# Patient Record
Sex: Male | Born: 1945
Health system: Southern US, Community
[De-identification: ages and names within clinical notes are randomized; demographics above are authoritative.]

## PROBLEM LIST (undated history)

## (undated) DIAGNOSIS — K279 Peptic ulcer, site unspecified, unspecified as acute or chronic, without hemorrhage or perforation: Secondary | ICD-10-CM

## (undated) DIAGNOSIS — E785 Hyperlipidemia, unspecified: Secondary | ICD-10-CM

## (undated) DIAGNOSIS — C3432 Malignant neoplasm of lower lobe, left bronchus or lung: Secondary | ICD-10-CM

## (undated) DIAGNOSIS — R739 Hyperglycemia, unspecified: Secondary | ICD-10-CM

## (undated) DIAGNOSIS — C4492 Squamous cell carcinoma of skin, unspecified: Secondary | ICD-10-CM

## (undated) DIAGNOSIS — H269 Unspecified cataract: Secondary | ICD-10-CM

## (undated) DIAGNOSIS — J069 Acute upper respiratory infection, unspecified: Secondary | ICD-10-CM

## (undated) DIAGNOSIS — K5730 Diverticulosis of large intestine without perforation or abscess without bleeding: Secondary | ICD-10-CM

## (undated) DIAGNOSIS — K5790 Diverticulosis of intestine, part unspecified, without perforation or abscess without bleeding: Secondary | ICD-10-CM

## (undated) DIAGNOSIS — Z0000 Encounter for general adult medical examination without abnormal findings: Secondary | ICD-10-CM

## (undated) DIAGNOSIS — I10 Essential (primary) hypertension: Secondary | ICD-10-CM

## (undated) DIAGNOSIS — M1990 Unspecified osteoarthritis, unspecified site: Secondary | ICD-10-CM

## (undated) DIAGNOSIS — N486 Induration penis plastica: Secondary | ICD-10-CM

## (undated) DIAGNOSIS — C8590 Non-Hodgkin lymphoma, unspecified, unspecified site: Secondary | ICD-10-CM

## (undated) DIAGNOSIS — R413 Other amnesia: Secondary | ICD-10-CM

## (undated) DIAGNOSIS — C3430 Malignant neoplasm of lower lobe, unspecified bronchus or lung: Secondary | ICD-10-CM

## (undated) DIAGNOSIS — L0390 Cellulitis, unspecified: Secondary | ICD-10-CM

## (undated) DIAGNOSIS — C801 Malignant (primary) neoplasm, unspecified: Secondary | ICD-10-CM

## (undated) DIAGNOSIS — T7840XA Allergy, unspecified, initial encounter: Secondary | ICD-10-CM

## (undated) DIAGNOSIS — G709 Myoneural disorder, unspecified: Secondary | ICD-10-CM

## (undated) DIAGNOSIS — N189 Chronic kidney disease, unspecified: Secondary | ICD-10-CM

## (undated) DIAGNOSIS — K219 Gastro-esophageal reflux disease without esophagitis: Secondary | ICD-10-CM

## (undated) HISTORY — DX: Essential (primary) hypertension: I10

## (undated) HISTORY — DX: Cellulitis, unspecified: L03.90

## (undated) HISTORY — PX: SKIN BIOPSY: SHX1

## (undated) HISTORY — DX: Gilbert syndrome: E80.4

## (undated) HISTORY — DX: Encounter for general adult medical examination without abnormal findings: Z00.00

## (undated) HISTORY — DX: Hyperlipidemia, unspecified: E78.5

## (undated) HISTORY — DX: Unspecified cataract: H26.9

## (undated) HISTORY — PX: OTHER SURGICAL HISTORY: SHX169

## (undated) HISTORY — DX: Non-Hodgkin lymphoma, unspecified, unspecified site: C85.90

## (undated) HISTORY — DX: Chronic kidney disease, unspecified: N18.9

## (undated) HISTORY — DX: Malignant neoplasm of lower lobe, left bronchus or lung: C34.32

## (undated) HISTORY — PX: VASECTOMY: SHX75

## (undated) HISTORY — DX: Induration penis plastica: N48.6

## (undated) HISTORY — DX: Diverticulosis of large intestine without perforation or abscess without bleeding: K57.30

## (undated) HISTORY — DX: Malignant (primary) neoplasm, unspecified: C80.1

## (undated) HISTORY — DX: Hyperglycemia, unspecified: R73.9

## (undated) HISTORY — DX: Peptic ulcer, site unspecified, unspecified as acute or chronic, without hemorrhage or perforation: K27.9

## (undated) HISTORY — DX: Squamous cell carcinoma of skin, unspecified: C44.92

## (undated) HISTORY — DX: Other amnesia: R41.3

## (undated) HISTORY — PX: VEIN SURGERY: SHX48

## (undated) HISTORY — DX: Malignant neoplasm of lower lobe, unspecified bronchus or lung: C34.30

## (undated) HISTORY — PX: REFRACTIVE SURGERY: SHX103

## (undated) HISTORY — DX: Allergy, unspecified, initial encounter: T78.40XA

---

## 1993-07-30 DIAGNOSIS — K279 Peptic ulcer, site unspecified, unspecified as acute or chronic, without hemorrhage or perforation: Secondary | ICD-10-CM

## 1993-07-30 HISTORY — DX: Peptic ulcer, site unspecified, unspecified as acute or chronic, without hemorrhage or perforation: K27.9

## 2003-07-08 ENCOUNTER — Encounter: Payer: Self-pay | Admitting: Internal Medicine

## 2003-07-31 HISTORY — PX: COLONOSCOPY W/ POLYPECTOMY: SHX1380

## 2004-09-19 ENCOUNTER — Ambulatory Visit: Payer: Self-pay | Admitting: Internal Medicine

## 2005-08-29 ENCOUNTER — Ambulatory Visit: Payer: Self-pay | Admitting: Internal Medicine

## 2006-10-08 ENCOUNTER — Ambulatory Visit: Payer: Self-pay | Admitting: Internal Medicine

## 2006-10-08 LAB — CONVERTED CEMR LAB
AST: 25 units/L (ref 0–37)
Albumin: 3.9 g/dL (ref 3.5–5.2)
Alkaline Phosphatase: 50 units/L (ref 39–117)
BUN: 16 mg/dL (ref 6–23)
Basophils Absolute: 0 10*3/uL (ref 0.0–0.1)
Basophils Relative: 0.2 % (ref 0.0–1.0)
CO2: 32 meq/L (ref 19–32)
Chloride: 101 meq/L (ref 96–112)
Cholesterol: 195 mg/dL (ref 0–200)
Creatinine, Ser: 1.1 mg/dL (ref 0.4–1.5)
HCT: 43.2 % (ref 39.0–52.0)
Hemoglobin: 14.8 g/dL (ref 13.0–17.0)
LDL Cholesterol: 117 mg/dL — ABNORMAL HIGH (ref 0–99)
MCHC: 34.3 g/dL (ref 30.0–36.0)
Monocytes Relative: 7.3 % (ref 3.0–11.0)
Neutrophils Relative %: 58.2 % (ref 43.0–77.0)
RBC: 4.71 M/uL (ref 4.22–5.81)
RDW: 12.3 % (ref 11.5–14.6)
Total Bilirubin: 1.3 mg/dL — ABNORMAL HIGH (ref 0.3–1.2)
Total CHOL/HDL Ratio: 3.1
Total Protein: 7.1 g/dL (ref 6.0–8.3)
VLDL: 14 mg/dL (ref 0–40)

## 2006-10-15 ENCOUNTER — Ambulatory Visit: Payer: Self-pay | Admitting: Internal Medicine

## 2007-07-31 DIAGNOSIS — R413 Other amnesia: Secondary | ICD-10-CM

## 2007-07-31 HISTORY — DX: Other amnesia: R41.3

## 2007-07-31 HISTORY — PX: CYSTOSCOPY: SUR368

## 2007-11-03 ENCOUNTER — Telehealth (INDEPENDENT_AMBULATORY_CARE_PROVIDER_SITE_OTHER): Payer: Self-pay | Admitting: *Deleted

## 2007-12-02 ENCOUNTER — Telehealth (INDEPENDENT_AMBULATORY_CARE_PROVIDER_SITE_OTHER): Payer: Self-pay | Admitting: *Deleted

## 2008-01-28 ENCOUNTER — Ambulatory Visit: Payer: Self-pay | Admitting: Internal Medicine

## 2008-01-28 DIAGNOSIS — M2550 Pain in unspecified joint: Secondary | ICD-10-CM | POA: Insufficient documentation

## 2008-01-28 DIAGNOSIS — N486 Induration penis plastica: Secondary | ICD-10-CM

## 2008-01-28 DIAGNOSIS — I10 Essential (primary) hypertension: Secondary | ICD-10-CM | POA: Insufficient documentation

## 2008-01-28 DIAGNOSIS — E782 Mixed hyperlipidemia: Secondary | ICD-10-CM | POA: Insufficient documentation

## 2008-02-02 ENCOUNTER — Telehealth (INDEPENDENT_AMBULATORY_CARE_PROVIDER_SITE_OTHER): Payer: Self-pay | Admitting: *Deleted

## 2008-02-02 ENCOUNTER — Encounter (INDEPENDENT_AMBULATORY_CARE_PROVIDER_SITE_OTHER): Payer: Self-pay | Admitting: *Deleted

## 2008-03-18 ENCOUNTER — Ambulatory Visit: Payer: Self-pay | Admitting: Internal Medicine

## 2008-03-19 ENCOUNTER — Encounter: Payer: Self-pay | Admitting: Internal Medicine

## 2008-03-22 LAB — CONVERTED CEMR LAB
Hemoglobin, Urine: NEGATIVE
Leukocytes, UA: NEGATIVE
Nitrite: NEGATIVE
Protein, ur: NEGATIVE mg/dL
pH: 6.5 (ref 5.0–8.0)

## 2008-06-07 ENCOUNTER — Ambulatory Visit: Payer: Self-pay | Admitting: Internal Medicine

## 2008-06-07 DIAGNOSIS — R310 Gross hematuria: Secondary | ICD-10-CM

## 2008-06-07 DIAGNOSIS — R413 Other amnesia: Secondary | ICD-10-CM | POA: Insufficient documentation

## 2008-06-07 DIAGNOSIS — R7309 Other abnormal glucose: Secondary | ICD-10-CM

## 2008-06-09 ENCOUNTER — Encounter (INDEPENDENT_AMBULATORY_CARE_PROVIDER_SITE_OTHER): Payer: Self-pay | Admitting: *Deleted

## 2008-06-10 ENCOUNTER — Encounter: Payer: Self-pay | Admitting: Internal Medicine

## 2008-06-17 ENCOUNTER — Encounter: Payer: Self-pay | Admitting: Internal Medicine

## 2008-06-19 LAB — CONVERTED CEMR LAB
Metaneph Total, Ur: 328 ug/24hr (ref 224–832)
Normetanephrine, 24H Ur: 265 (ref 122–676)

## 2008-06-21 ENCOUNTER — Ambulatory Visit: Payer: Self-pay | Admitting: Internal Medicine

## 2009-01-21 ENCOUNTER — Ambulatory Visit: Payer: Self-pay | Admitting: Internal Medicine

## 2009-01-21 DIAGNOSIS — Z860100 Personal history of colon polyps, unspecified: Secondary | ICD-10-CM | POA: Insufficient documentation

## 2009-01-21 DIAGNOSIS — K5730 Diverticulosis of large intestine without perforation or abscess without bleeding: Secondary | ICD-10-CM | POA: Insufficient documentation

## 2009-01-21 DIAGNOSIS — C4492 Squamous cell carcinoma of skin, unspecified: Secondary | ICD-10-CM

## 2009-01-21 HISTORY — DX: Squamous cell carcinoma of skin, unspecified: C44.92

## 2009-01-24 ENCOUNTER — Encounter: Payer: Self-pay | Admitting: Internal Medicine

## 2009-02-04 ENCOUNTER — Encounter (INDEPENDENT_AMBULATORY_CARE_PROVIDER_SITE_OTHER): Payer: Self-pay | Admitting: *Deleted

## 2009-07-30 DIAGNOSIS — L0390 Cellulitis, unspecified: Secondary | ICD-10-CM

## 2009-07-30 HISTORY — DX: Cellulitis, unspecified: L03.90

## 2010-02-01 ENCOUNTER — Ambulatory Visit: Payer: Self-pay | Admitting: Internal Medicine

## 2010-02-01 DIAGNOSIS — N401 Enlarged prostate with lower urinary tract symptoms: Secondary | ICD-10-CM | POA: Insufficient documentation

## 2010-02-01 DIAGNOSIS — N400 Enlarged prostate without lower urinary tract symptoms: Secondary | ICD-10-CM | POA: Insufficient documentation

## 2010-02-01 DIAGNOSIS — M79609 Pain in unspecified limb: Secondary | ICD-10-CM | POA: Insufficient documentation

## 2010-02-22 ENCOUNTER — Ambulatory Visit: Payer: Self-pay | Admitting: Internal Medicine

## 2010-02-22 DIAGNOSIS — IMO0002 Reserved for concepts with insufficient information to code with codable children: Secondary | ICD-10-CM

## 2010-02-23 ENCOUNTER — Telehealth (INDEPENDENT_AMBULATORY_CARE_PROVIDER_SITE_OTHER): Payer: Self-pay | Admitting: *Deleted

## 2010-02-23 LAB — CONVERTED CEMR LAB
Basophils Absolute: 0 10*3/uL (ref 0.0–0.1)
Eosinophils Relative: 0.3 % (ref 0.0–5.0)
HCT: 40.8 % (ref 39.0–52.0)
Hemoglobin: 14.3 g/dL (ref 13.0–17.0)
Lymphocytes Relative: 28.3 % (ref 12.0–46.0)
Lymphs Abs: 3.2 10*3/uL (ref 0.7–4.0)
Monocytes Relative: 7.2 % (ref 3.0–12.0)
Platelets: 166 10*3/uL (ref 150.0–400.0)
RDW: 12.7 % (ref 11.5–14.6)
WBC: 11.2 10*3/uL — ABNORMAL HIGH (ref 4.5–10.5)

## 2010-02-24 ENCOUNTER — Telehealth (INDEPENDENT_AMBULATORY_CARE_PROVIDER_SITE_OTHER): Payer: Self-pay | Admitting: *Deleted

## 2010-03-10 ENCOUNTER — Encounter: Payer: Self-pay | Admitting: Internal Medicine

## 2010-08-01 ENCOUNTER — Telehealth (INDEPENDENT_AMBULATORY_CARE_PROVIDER_SITE_OTHER): Payer: Self-pay | Admitting: *Deleted

## 2010-08-27 LAB — CONVERTED CEMR LAB
AST: 25 units/L (ref 0–37)
Alkaline Phosphatase: 51 units/L (ref 39–117)
Alkaline Phosphatase: 52 units/L (ref 39–117)
Alkaline Phosphatase: 53 units/L (ref 39–117)
BUN: 18 mg/dL (ref 6–23)
Basophils Absolute: 0 10*3/uL (ref 0.0–0.1)
Basophils Relative: 0 % (ref 0.0–3.0)
Bilirubin, Direct: 0.1 mg/dL (ref 0.0–0.3)
Bilirubin, Direct: 0.1 mg/dL (ref 0.0–0.3)
Blood in Urine, dipstick: NEGATIVE
Calcium: 9.4 mg/dL (ref 8.4–10.5)
Calcium: 9.4 mg/dL (ref 8.4–10.5)
Chloride: 101 meq/L (ref 96–112)
Cholesterol, target level: 200 mg/dL
Cholesterol: 177 mg/dL (ref 0–200)
Creatinine, Ser: 0.8 mg/dL (ref 0.4–1.5)
Creatinine, Ser: 1.1 mg/dL (ref 0.4–1.5)
Eosinophils Absolute: 0.1 10*3/uL (ref 0.0–0.7)
Eosinophils Absolute: 0.2 10*3/uL (ref 0.0–0.7)
Eosinophils Relative: 2.3 % (ref 0.0–5.0)
GFR calc Af Amer: 79 mL/min
GFR calc non Af Amer: 101.39 mL/min (ref >60)
Glucose, Bld: 113 mg/dL — ABNORMAL HIGH (ref 70–99)
Glucose, Bld: 96 mg/dL (ref 70–99)
Glucose, Urine, Semiquant: NEGATIVE
HCT: 39.6 % (ref 39.0–52.0)
HDL goal, serum: 40 mg/dL
HDL: 53.4 mg/dL (ref >39.0)
HDL: 60.3 mg/dL (ref >39.00)
Ketones, urine, test strip: NEGATIVE
LDL Cholesterol: 110 mg/dL — ABNORMAL HIGH (ref 0–99)
LDL Goal: 115 mg/dL
Lymphocytes Relative: 37.3 % (ref 12.0–46.0)
Lymphocytes Relative: 39.3 % (ref 12.0–46.0)
Lymphocytes Relative: 41.9 % (ref 12.0–46.0)
MCHC: 34 g/dL (ref 30.0–36.0)
MCHC: 34.9 g/dL (ref 30.0–36.0)
Monocytes Absolute: 0.6 10*3/uL (ref 0.1–1.0)
Monocytes Relative: 6.9 % (ref 3.0–12.0)
Monocytes Relative: 8.4 % (ref 3.0–12.0)
Neutrophils Relative %: 48.2 % (ref 43.0–77.0)
Neutrophils Relative %: 51.2 % (ref 43.0–77.0)
Neutrophils Relative %: 54.6 % (ref 43.0–77.0)
PSA: 1.64 ng/mL (ref 0.10–4.00)
PSA: 1.71 ng/mL (ref 0.10–4.00)
Platelets: 177 10*3/uL (ref 150.0–400.0)
Platelets: 217 10*3/uL (ref 150–400)
Potassium: 3.8 meq/L (ref 3.5–5.1)
Protein, U semiquant: NEGATIVE
RBC: 4.65 M/uL (ref 4.22–5.81)
RDW: 12 % (ref 11.5–14.6)
RDW: 13.8 % (ref 11.5–14.6)
Sed Rate: 29 mm/hr — ABNORMAL HIGH (ref 0–16)
Sodium: 139 meq/L (ref 135–145)
Sodium: 142 meq/L (ref 135–145)
TSH: 1.47 microintl units/mL (ref 0.35–5.50)
Total Bilirubin: 1.2 mg/dL (ref 0.3–1.2)
Total Bilirubin: 1.3 mg/dL — ABNORMAL HIGH (ref 0.3–1.2)
Total CHOL/HDL Ratio: 3.3
Total Protein: 7.4 g/dL (ref 6.0–8.3)
Total Protein: 7.5 g/dL (ref 6.0–8.3)
Triglycerides: 66 mg/dL (ref 0–149)
Triglycerides: 85 mg/dL (ref 0.0–149.0)
Uric Acid, Serum: 7.6 mg/dL (ref 4.0–7.8)
VLDL: 13 mg/dL (ref 0–40)
VLDL: 17 mg/dL (ref 0.0–40.0)
WBC Urine, dipstick: NEGATIVE
WBC: 7.3 10*3/uL (ref 4.5–10.5)
pH: 6.5

## 2010-08-29 NOTE — Assessment & Plan Note (Signed)
Summary: emp//pt will be fasting//lch   Vital Signs:  Patient profile:   65 year old male Height:      70.5 inches Weight:      205.6 pounds BMI:     29.19 Temp:     98.1 degrees F oral Pulse rate:   60 / minute Resp:     14 per minute BP sitting:   120 / 72  (left arm) Cuff size:   large  Vitals Entered By: Shonna Chock (February 01, 2010 10:59 AM) CC: CPX with fasting labs , Removed chlosterol med from list, Lipid Management Comments REVIEWED MED LIST, PATIENT AGREED DOSE AND INSTRUCTION CORRECT    CC:  CPX with fasting labs , Removed chlosterol med from list, and Lipid Management.  History of Present Illness: [name] is here for his IAPP Medicare physical.Medical risks reviewed & addressed(see data under Entry & in ROS). Hyperlipidemia Follow-Up      This is a 65 year old man who presents for Hyperlipidemia follow-up.  Statin was never started as Rxed 02/15/2009.  The patient denies the following symptoms: chest pain/pressure, exercise intolerance, dypsnea, palpitations, syncope, and pedal edema.  Dietary compliance has been good; diet now Heart Healthy.  The patient reports exercising 3-4X per week. Hypertension Follow-Up      The patient also presents for Hypertension follow-up.  BP @ home 136/77.  The patient denies lightheadedness, urinary frequency, headaches, rash, and fatigue.  Compliance with medications (by patient report) has been near 100%.  Adjunctive measures currently used by the patient include  improved salt restriction.                                                                                                                                     No ADL limitations; no depression or mood changes;no hearing loss( whisper heard @ 6 feet);vision intact w/o lenses to reading wall charts. No gait or balance abnormalities.Oriented X 3 , normal recall & memory.Health Care POA in place. For Care Plan & Orders, please see Orders & Patient Instructions. PMH & interim  reviewed & corrections & additions made.  Lipid Management History:      Positive NCEP/ATP III risk factors include male age 67 years old or older and hypertension.  Negative NCEP/ATP III risk factors include non-diabetic, no family history for ischemic heart disease, non-tobacco-user status, no ASHD (atherosclerotic heart disease), no prior stroke/TIA, no peripheral vascular disease, and no history of aortic aneurysm.     Preventive Screening-Counseling & Management  Alcohol-Tobacco     Alcohol drinks/day: 2     Alcohol Counseling: not indicated; use of alcohol is not excessive or problematic     Smoking Status: quit     Year Quit: 1981  Caffeine-Diet-Exercise     Caffeine use/day: 3 cups/ day  Hep-HIV-STD-Contraception     Dental Visit-last 6 months yes     Sun Exposure-Excessive: no  Safety-Violence-Falls  Seat Belt Use: yes     Firearms in the Home: firearms in the home     Smoke Detectors: yes     Violence in the Home: no risk noted     Sexual Abuse: no     Fall Risk: no environmental risks      Sexual History:  currently monogamous.        Drug Use:  never.        Blood Transfusions:  no.        Travel History:  Last in 2002.    Allergies (verified): No Known Drug Allergies  Past History:  Past Medical History: Hypertension Peyronies Disease Gilbert's Syndrome Hyperlipidemia: [place] Study LDL goal = < 130; NMR Panel LDL goal = < 115 Isolated amnesia for 30 min  with elevated BP, hematuria &  hyperglycemia  documented(Glucose 121) [facility]  ER  06/05/08; no recurrence Colonic polyps, hx of Diverticulosis, colon Skin cancer, hx of, Dr Karlyn Agee, Derm(practicing sun exposure) Benign prostatic hypertrophy  Past Surgical History: Peptic Ulcer, Endo 1995 2000-Colonoscopy neg. OP RLE vein resection Colon polypectomy 2005 : 1 hyperplastic polyp ,Diverticulosis ,redo 2015, Dr Myriam Forehand ,[place] [facility] 1985 Vasectomy Cystoscopy, GU  imaging 05/2008 for  hematuria , Dr Merry Lofty ,Urology, neg exam except for BPH; " Microwave " prostate ablation, 07/2008 (NO cancer)  Social History: Caffeine use/day:  3 cups/ day Dental Care w/in 6 mos.:  yes Sun Exposure-Excessive:  no Seat Belt Use:  yes Fall Risk:  no environmental risks Sexual History:  currently monogamous Drug Use:  never Blood Transfusions:  no  Review of Systems  The patient denies anorexia, vision loss, decreased hearing, hoarseness, prolonged cough, hemoptysis, abdominal pain, melena, hematochezia, severe indigestion/heartburn, hematuria, incontinence, depression, unusual weight change, abnormal bleeding, enlarged lymph nodes, and angioedema.         Swelling , temp change (warm) & redness base R great toe & dorsum of foot  with pain this  week ; resolved w/o Rx. PMH of gout  Physical Exam  General:  well-nourished,in no acute distress; alert,appropriate and cooperative throughout examination Head:  Normocephalic and atraumatic without obvious abnormalities. pattern  alopecia . Eyes:  No corneal or conjunctival inflammation noted.Perrla. Funduscopic exam benign, without hemorrhages, exudates or papilledema. Vision grossly normal. Ears:  External ear exam shows no significant lesions or deformities.  Otoscopic examination reveals clear canals, tympanic membranes are intact bilaterally without bulging, retraction, inflammation or discharge. Hearing is grossly normal bilaterally. Nose:  External nasal examination shows no deformity or inflammation. Nasal mucosa are pink and moist without lesions or exudates. Mouth:  Oral mucosa and oropharynx without lesions or exudates.  Teeth in good repair. Neck:  No deformities, masses, or tenderness noted. Thyroid small; hyoid palpable Lungs:  Normal respiratory effort, chest expands symmetrically. Lungs are clear to auscultation, no crackles or wheezes. Heart:  regular rhythm, no murmur, no gallop, no rub, no JVD, no HJR, and bradycardia.   S4 Abdomen:  Bowel sounds positive,abdomen soft and non-tender without masses, organomegaly or hernias noted. Genitalia:  Dr Merry Lofty seen 2010 Pulses:  R and L carotid,radial,dorsalis pedis and posterior tibial pulses are full and equal bilaterally Extremities:  No clubbing, cyanosis, edema, or deformity noted with normal full range of motion of all joints.   R foot not tender Neurologic:  alert & oriented X3, gait normal, and DTRs symmetrical and normal.   Skin:  Solar changes Cervical Nodes:  No lymphadenopathy noted Axillary Nodes:  No palpable lymphadenopathy Psych:  memory intact for recent and remote, normally interactive, good eye contact, not anxious appearing, and not depressed appearing.     Impression & Recommendations:  Problem # 1:  PREVENTIVE HEALTH CARE (ICD-V70.0)  Orders: First annual wellness visit with prevention plan  (Q2595) UA Dipstick W/ Micro (manual) (63875)  Problem # 2:  FOOT PAIN, RIGHT (ICD-729.5)  PMH of gout  Orders: Venipuncture (64332) TLB-Uric Acid, Blood (84550-URIC)  Problem # 3:  HYPERLIPIDEMIA (ICD-272.4)  The following medications were removed from the medication list:    Pravastatin Sodium 20 Mg Tabs (Pravastatin sodium) .[name] Kitchen... 1 at bedtime  Orders: Venipuncture (95188) TLB-Lipid Panel (80061-LIPID) TLB-Hepatic/Liver Function Pnl (80076-HEPATIC) TLB-TSH (Thyroid Stimulating Hormone) (84443-TSH)  Problem # 4:  HYPERTENSION (ICD-401.9)  His updated medication list for this problem includes:    Lisinopril-hydrochlorothiazide 20-12.5 Mg Tabs (Lisinopril-hydrochlorothiazide) .[name] Kitchen... 1 qd  Orders: Venipuncture (41660) TLB-BMP (Basic Metabolic Panel-BMET) (80048-METABOL) EKG w/ Interpretation (93000)  Problem # 5:  SKIN CANCER, HX OF (ICD-V10.83) as per Dr Karlyn Agee  Problem # 6:  BENIGN PROSTATIC HYPERTROPHY (ICD-600.00)  Orders: Venipuncture (63016) TLB-PSA (Prostate Specific Antigen) (84153-PSA)  Complete Medication  List: 1)  Diltiazem Hcl 180 Mg Qd  .... Take one tablet daily. 2)  Lisinopril-hydrochlorothiazide 20-12.5 Mg Tabs (Lisinopril-hydrochlorothiazide) .[name] Kitchen.. 1 qd  Other Orders: Pneumococcal Vaccine (01093) Admin 1st Vaccine (23557) TLB-CBC Platelet - w/Differential (85025-CBCD) TLB-A1C / Hgb A1C (Glycohemoglobin) (83036-A1C)  Lipid Assessment/Plan:      Based on NCEP/ATP III, the patient's risk factor category is "0-1 risk factors".  The patient's lipid goals are as follows: Total cholesterol goal is 200; LDL cholesterol goal is 115; HDL cholesterol goal is 40; Triglyceride goal is 150.  His LDL cholesterol goal has been met.    Patient Instructions: 1)  Please complete stool cards. Uric acid will be checked; if high , HCTZ will need to be stopped.Check your Blood Pressure regularly. If it is above: 135/85 ON AVERAGE.you should make an appointment. Prescriptions: DILTIAZEM HCL 180 MG QD Take one tablet daily.  #90 x 3   Entered and Authorized by:   Marga Melnick MD   Signed by:   Marga Melnick MD on 02/01/2010   Method used:   Print then Give to Patient   RxID:   [id number]    Immunization History:  Tetanus/Td Immunization History:    Tetanus/Td:  historical (09/27/2001)  Immunizations Administered:  Pneumonia Vaccine:    Vaccine Type: Pneumovax (Medicare)    Site: left deltoid    Mfr: Merck    Dose: 0.5 ml    Route: IM    Given by: Shonna Chock    Exp. Date: 05/24/2011    Lot #: [id number]   Laboratory Results   Urine Tests   Date/Time Reported: February 01, 2010 1:16 PM   Routine Urinalysis   Color: yellow Appearance: Clear Glucose: negative   (Normal Range: Negative) Bilirubin: negative   (Normal Range: Negative) Ketone: negative   (Normal Range: Negative) Spec. Gravity: 1.015   (Normal Range: 1.003-1.035) Blood: negative   (Normal Range: Negative) pH: 6.5   (Normal Range: 5.0-8.0) Protein: negative   (Normal Range: Negative) Urobilinogen: negative   (Normal  Range: 0-1) Nitrite: negative   (Normal Range: Negative) Leukocyte Esterace: negative   (Normal Range: Negative)    Comments: Floydene Flock  February 01, 2010 1:16 PM

## 2010-08-29 NOTE — Assessment & Plan Note (Signed)
Summary: bite on left arm/ok per chrae//kn   Vital Signs:  Patient profile:   65 year old male Weight:      207.8 pounds BMI:     29.50 Temp:     99.5 degrees F oral Pulse rate:   88 / minute Resp:     16 per minute BP sitting:   178 / 98  (left arm) Cuff size:   large  Vitals Entered By: Shonna Chock CMA (February 22, 2010 12:38 PM) CC: 1.) Swelling on left elbow since Monday, ? what happened   2.) Patient not happy with Lisinopril-Headaches and drowsy   CC:  1.) Swelling on left elbow since Monday and ? what happened   2.) Patient not happy with Lisinopril-Headaches and drowsy.  History of Present Illness:      He woke 02/20/2010 with tender  "ball"@ L elbow with some redness. As of 07/26 he has noted progressive edema & redness of L forearm.Rx: ice pack; 2 Aleve w/o benefit.Tetanus is up to date.      Also he is concerned about potential adverse effects of Lisinopril; he feels it is causing dowsiness &  a diffuse , dull  headache intermittently. Lisinopril /HCT  was D/Ced due to possible gout. Uric acid was 7.6. He  denies cough, angioedema lightheadedness, urinary frequency, and fatigue.  The patient denies the following associated symptoms: chest pain, chest pressure, exercise intolerance, dyspnea, palpitations, syncope, leg edema, and pedal edema.  Compliance with medications (by patient report) has been near 100%.  Adjunctive measures currently used by the patient include salt restriction.  Note: repeat BP RUE was 144/94.   Current Medications (verified): 1)  Diltiazem Hcl 180 Mg Qd .... Take One Tablet Daily. 2)  Lisinopril 40 Mg Tabs (Lisinopril) .[name] Kitchen.. 1 Once Daily in Place of Lisinopril /hct  Allergies (verified): No Known Drug Allergies  Review of Systems General:  Denies chills, fever, and sweats. Derm:  Complains of changes in color of skin; denies itching.  Physical Exam  General:  in no acute distress; alert,appropriate and cooperative throughout examination Lungs:   Normal respiratory effort, chest expands symmetrically. Lungs are clear to auscultation, no crackles or wheezes. Heart:  Normal rate and regular rhythm. S1 and S2 normal without gallop, murmur, click, rub. S4  Pulses:  R and L carotid,radial,dorsalis pedis and posterior tibial pulses are full and equal bilaterally Extremities:  See SKIN. Fluctuant L olecranon bursa with erythema & increased heat. No pedal edema Skin:  23X ?8 cm erythema L forearm Cervical Nodes:  No lymphadenopathy noted Axillary Nodes:  No palpable lymphadenopathy; no epitrochlear LA Psych:  memory intact for recent and remote, normally interactive, and good eye contact.     Impression & Recommendations:  Problem # 1:  CELLULITIS/ABSCESS, ARM (ICD-682.3)  Orders: Orthopedic Referral (Ortho) Venipuncture ([address]) TLB-CBC Platelet - w/Differential (85025-CBCD)  His updated medication list for this problem includes:    Amoxicillin-pot Clavulanate 875-125 Mg Tabs (Amoxicillin-pot clavulanate) .[name] Kitchen... 1 every 12 hrs with a meal  Problem # 2:  HYPERTENSION (ICD-401.9) uncontrolled His updated medication list for this problem includes:    Lisinopril 40 Mg Tabs (Lisinopril) .[name] Kitchen... 1 once daily in place of lisinopril /hct  Complete Medication List: 1)  Diltiazem Hcl 180 Mg Qd  .... Take one tablet daily. 2)  Lisinopril 40 Mg Tabs (Lisinopril) .[name] Kitchen.. 1 once daily in place of lisinopril /hct 3)  Amoxicillin-pot Clavulanate 875-125 Mg Tabs (Amoxicillin-pot clavulanate) .[name] Kitchen.. 1 every 12 hrs with a meal  Patient Instructions: 1)  Call if pain meds needed.Check your Blood Pressure regularly. If it is above:  135/85 ON AVERAGE you should  increase Diltiazem to two times a day  Prescriptions: AMOXICILLIN-POT CLAVULANATE 875-125 MG TABS (AMOXICILLIN-POT CLAVULANATE) 1 every 12 hrs with a meal  #20 x 0   Entered and Authorized by:   Marga Melnick MD   Signed by:   Marga Melnick MD on 02/22/2010   Method used:   Faxed to ...        [facility] [address] (retail)       [address]       [place], [place]  [address]       Ph: [phone]       Fax: [phone]   RxID:   [id number]   Appended Document: bite on left arm/ok per chrae//kn

## 2010-08-29 NOTE — Progress Notes (Signed)
Summary: Triage: Elbow Pain  Phone Note Call from Patient Call back at Home Phone [phone]   Caller: Patient Summary of Call: Message left on Triage VM: Patient was seen on 02/22/2010 and old to call if pain onset, paitent now with elbow pain and would like RX called in   Dr.Hopper please advise Shonna Chock CMA  February 24, 2010 2:28 PM   Follow-up for Phone Call        Pt would like the RX sent to [facility] in [place]. Phone number is [phone]. Follow-up by: Lavell Islam,  February 24, 2010 3:39 PM  Additional Follow-up for Phone Call Additional follow up Details #1::        Patient aware rx sent in Additional Follow-up by: Shonna Chock CMA,  February 24, 2010 4:30 PM    New/Updated Medications: TRAMADOL HCL 50 MG TABS (TRAMADOL HCL) 1 every 6 hrs as needed for pain Prescriptions: TRAMADOL HCL 50 MG TABS (TRAMADOL HCL) 1 every 6 hrs as needed for pain  #30 x 1   Entered and Authorized by:   Marga Melnick MD   Signed by:   Marga Melnick MD on 02/24/2010   Method used:   [facility]ed [facility] ...       [facility]  [address]  [id number]       [address]       [place], [place]  [address]       Ph: [phone] or [phone]       Fax: [phone]   RxID:   [id number]

## 2010-08-29 NOTE — Progress Notes (Signed)
Summary: Lab results  Phone Note Outgoing Call Call back at Home Phone [phone]   Call placed by: Shonna Chock CMA,  February 23, 2010 2:15 PM Call placed to: Patient Details for Reason: Lab results Summary of Call: Spoke with patient about labs below (copy mailed), Patient with no questions or concerns at the time of call  _______________________________________________________________________ White count mildly elevated; go to ER if having high fevers or pain in L arm on antibiotics. Levester Fresh CMA  February 23, 2010 2:16 PM

## 2010-08-29 NOTE — Letter (Signed)
Summary: [facility]  [facility]   Imported By: Lanelle Bal 03/20/2010 11:37:58  _____________________________________________________________________  External Attachment:    Type:   Image     Comment:   External Document

## 2010-08-31 NOTE — Progress Notes (Signed)
Summary: Refill Request  Phone Note Refill Request Message from:  Patient on August 01, 2010 11:11 AM  Refills Requested: Medication #1:  DILTIAZEM HCL 180 MG QD Take one tablet daily.   Dosage confirmed as above?Dosage Confirmed   Supply Requested: 3 months   Notes: switching pharmacy's  Medication #2:  LISINOPRIL 40 MG TABS 1 once daily in place of Lisinopril /HCT   Dosage confirmed as above?Dosage Confirmed   Supply Requested: 3 months [facility] Acct #: [id number] Member ID#: [id number]  Next Appointment Scheduled: none Initial call taken by: Harold Barban,  August 01, 2010 11:12 AM    Prescriptions: LISINOPRIL 40 MG TABS (LISINOPRIL) 1 once daily in place of Lisinopril /HCT  #90 x 1   Entered by:   Shonna Chock CMA   Authorized by:   Marga Melnick MD   Signed by:   Shonna Chock CMA on 08/01/2010   Method used:   Faxed to ...       [facility] (mail-order)             , [place]         Ph: [phone]       Fax: [phone]   RxID:   [id number] DILTIAZEM HCL 180 MG QD Take one tablet daily.  #90 x 1   Entered by:   Shonna Chock CMA   Authorized by:   Marga Melnick MD   Signed by:   Shonna Chock CMA on 08/01/2010   Method used:   Faxed to ...       [facility] (mail-order)             , [place]         Ph: [phone]       Fax: [phone]   RxID:   [id number]

## 2010-09-05 ENCOUNTER — Encounter: Payer: Self-pay | Admitting: Internal Medicine

## 2010-09-06 ENCOUNTER — Telehealth (INDEPENDENT_AMBULATORY_CARE_PROVIDER_SITE_OTHER): Payer: Self-pay | Admitting: *Deleted

## 2010-09-14 NOTE — Progress Notes (Signed)
Summary: Medication Concerns   Phone Note Call from Patient Call back at Home Phone [phone]   Caller: Patient Summary of Call: Patient faxed over paper about Diltiazem concerns from [facility] ((to be scanned), paper states Unfortunatley we are unable to process refill due to need to verify dose form and they have tried to contact us several times and was unable to reach [place].  I infomred the patient this is the first time Ive seen this and I will call [facility] to have them fill med. Patient stated he received [facility] since [facility] stated they was unable to process RX at the time RX was written but he would like future refills to come from [facility].   I called [facility] and they indicated they needed to know the type of Diltizem to dispense IE: CD, LA, CR ect . . . . . . .they indicated [facility]. I informed them to fill CD since thats what he last had. [name] KitchenShonna Chock CMA  September 06, 2010 2:04 PM     New/Updated Medications: DILT-CD 180 MG XR24H-CAP (DILTIAZEM HCL COATED BEADS) 1 by mouth once daily

## 2010-09-20 NOTE — Medication Information (Signed)
Summary: Need Clarification of Rx for Diltiazem  Need Clarification of Rx for Diltiazem   Imported By: Maryln Gottron 09/11/2010 14:07:00  _____________________________________________________________________  External Attachment:    Type:   Image     Comment:   External Document

## 2011-04-23 ENCOUNTER — Other Ambulatory Visit: Payer: Self-pay | Admitting: Internal Medicine

## 2011-04-23 MED ORDER — LISINOPRIL 40 MG PO TABS: 40.0000 mg | ORAL_TABLET | Freq: Every day | ORAL | Status: DC

## 2011-04-23 NOTE — Telephone Encounter (Signed)
Patient has  appt [phone] but needs refill for lisinopril 40 mg -mg [facility]

## 2011-04-23 NOTE — Telephone Encounter (Signed)
Rx sent 

## 2011-06-06 ENCOUNTER — Encounter: Payer: Self-pay | Admitting: Internal Medicine

## 2011-06-07 ENCOUNTER — Encounter: Payer: Self-pay | Admitting: Internal Medicine

## 2011-06-07 ENCOUNTER — Ambulatory Visit (INDEPENDENT_AMBULATORY_CARE_PROVIDER_SITE_OTHER): Payer: Medicare PPO | Admitting: Internal Medicine

## 2011-06-07 VITALS — BP 150/88 | HR 74 | Temp 97.6°F | Resp 12 | Ht 70.75 in | Wt 217.2 lb

## 2011-06-07 DIAGNOSIS — R310 Gross hematuria: Secondary | ICD-10-CM

## 2011-06-07 DIAGNOSIS — Z86010 Personal history of colonic polyps: Secondary | ICD-10-CM

## 2011-06-07 DIAGNOSIS — I10 Essential (primary) hypertension: Secondary | ICD-10-CM

## 2011-06-07 DIAGNOSIS — E785 Hyperlipidemia, unspecified: Secondary | ICD-10-CM

## 2011-06-07 DIAGNOSIS — N400 Enlarged prostate without lower urinary tract symptoms: Secondary | ICD-10-CM

## 2011-06-07 DIAGNOSIS — Z85828 Personal history of other malignant neoplasm of skin: Secondary | ICD-10-CM

## 2011-06-07 DIAGNOSIS — Z0000 Encounter for general adult medical examination without abnormal findings: Secondary | ICD-10-CM

## 2011-06-07 MED ORDER — LISINOPRIL 40 MG PO TABS: 40.0000 mg | ORAL_TABLET | Freq: Every day | ORAL | Status: DC

## 2011-06-07 MED ORDER — DILTIAZEM HCL ER BEADS 240 MG PO CP24: 240.0000 mg | ORAL_CAPSULE | Freq: Every day | ORAL | Status: DC

## 2011-06-07 NOTE — Patient Instructions (Signed)
Preventive Health Care: Exercise at least 30-45 minutes a day,  3-4 days a week.  Eat a low-fat diet with lots of fruits and vegetables, up to 7-9 servings per day. Consume less than 40 grams of sugar per day from foods & drinks with High Fructose Corn Sugar as # 1,2,3 or # 4 on label. Alcohol If you drink, do it moderately,less than 9 drinks per week, preferably less than 6 @ most. Blood Pressure Goal  Ideally is an AVERAGE < 135/85. This AVERAGE should be calculated from @ least 5-7 BP readings taken @ different times of day on different days of week. You should not respond to isolated BP readings , but rather the AVERAGE for that week  Avoid decongestants as these will raise blood pressure.

## 2011-06-07 NOTE — Progress Notes (Signed)
Subjective:    Patient ID: Kyle Long, male    DOB: 04-30-1946, 65 y.o.   MRN: [id number]  HPI Medicare Wellness Visit:  The following psychosocial & medical history were reviewed as required by Medicare.   Social history: caffeine: 3 cups of coffee/day , alcohol:  2 mixed drinks / day ,  tobacco use : quit 1981  & exercise : treadmill & golf 2 X / week , > 60 min.   Home & personal  safety / fall risk: no issues, activities of daily living: no limitations , seatbelt use : yes , and smoke alarm employment : yes .  Power of Attorney/Living Will status : in place  Vision ( as recorded per Nurse) & Hearing  evaluation :  Ophth , exam due. Whisper heard & wall chart read @ 6 ft. Orientation :oriented X 3  , memory & recall :good, spelling testing: good,and mood & affect : normal . Depression / anxiety: no issues Travel history : 2002 [place] , immunization status :flu not taken , transfusion history:  no, and preventive health surveillance ( colonoscopies, BMD , etc as per protocol/ [facility]): colonoscopy up to date, Dental care:  Seen annually . Chart reviewed &  Updated. Active issues reviewed & addressed.       Review of Systems CHRONIC HYPERTENSION: Disease Monitoring  Blood pressure range: 135-155/75-90; Sudafed x1& 1/2 weeks for congestion  Chest pain: no   Dyspnea: no   Claudication: no  Medication compliance: yes,   Medication Side Effects  Lightheadedness: no   Urinary frequency: no   Edema: no  Preventitive Healthcare:  Exercise: yes, but decreased recently   Diet Pattern: no plan  Salt Restriction: yes       Objective:   Physical Exam Gen.: Healthy and well-nourished in appearance. Alert, appropriate and cooperative throughout exam. Head: Normocephalic without obvious abnormalities;  pattern alopecia  Eyes: No corneal or conjunctival inflammation noted. Pupils equal round reactive to light and accommodation. Fundal exam is benign without hemorrhages, exudate,  papilledema. Extraocular motion intact.  Ears: External  ear exam reveals no significant lesions or deformities. Canals clear .TMs normal.  Nose: External nasal exam reveals no deformity or inflammation. Nasal mucosa are pink and moist. No lesions or exudates noted. Septum  Slightly red on R  Mouth: Oral mucosa and oropharynx reveal no lesions or exudates. Teeth in good repair. Neck: No deformities, masses, or tenderness noted. Range of motion &. Thyroid normal. Lungs: Normal respiratory effort; chest expands symmetrically. Lungs are clear to auscultation without rales, wheezes, or increased work of breathing. Heart: Normal rate and rhythm. Normal S1 and S2. No gallop, click, or rub. S 4 with slight slurring; no significant  murmur. Abdomen: Bowel sounds normal; abdomen soft and nontender. No masses, organomegaly .Large ventral hernia noted. Genitalia/ DRE:  vasectomy scar tissue is present. Prostate is not enlarged; there is no nodularity or induration present  .                                                                                   Musculoskeletal/extremities: No deformity or scoliosis noted of  the thoracic or lumbar spine. No clubbing, cyanosis, edema,  or deformity noted. Range of motion  normal .Tone & strength  normal.Joints normal. Nail health  good. Vascular: Carotid, radial artery, dorsalis pedis and  posterior tibial pulses are full and equal. No bruits present. Neurologic: Alert and oriented x3. Deep tendon reflexes symmetrical and normal.          Skin: Intact without suspicious lesions or rashes. Solar damage. Lymph: No cervical, axillary, or inguinal lymphadenopathy present. Psych: Mood and affect are normal. Normally interactive                                                                                         Assessment & Plan:  #1 Medicare Wellness Exam; criteria met ; data entered #2 Problem List reviewed ; Assessment/ Recommendations made Plan: see Orders      EKG is normal. The T wave tends to be low to flat in  AVF & V5-6.

## 2011-06-08 LAB — CBC WITH DIFFERENTIAL/PLATELET
Basophils Relative: 0.3 % (ref 0.0–3.0)
Eosinophils Relative: 1.6 % (ref 0.0–5.0)
MCV: 95.4 fl (ref 78.0–100.0)
Monocytes Absolute: 0.8 10*3/uL (ref 0.1–1.0)
Neutrophils Relative %: 27.1 % — ABNORMAL LOW (ref 43.0–77.0)
RBC: 4.62 Mil/uL (ref 4.22–5.81)
WBC: 8 10*3/uL (ref 4.5–10.5)

## 2011-06-08 LAB — BASIC METABOLIC PANEL
Chloride: 103 mEq/L (ref 96–112)
Creatinine, Ser: 0.9 mg/dL (ref 0.4–1.5)
Potassium: 4.3 mEq/L (ref 3.5–5.1)

## 2011-06-08 LAB — LDL CHOLESTEROL, DIRECT: Direct LDL: 136.5 mg/dL

## 2011-06-08 LAB — LIPID PANEL
HDL: 65.8 mg/dL (ref >39.00)
Total CHOL/HDL Ratio: 3
Triglycerides: 155 mg/dL — ABNORMAL HIGH (ref 0.0–149.0)

## 2011-06-08 LAB — HEPATIC FUNCTION PANEL
Albumin: 4.5 g/dL (ref 3.5–5.2)
Alkaline Phosphatase: 62 U/L (ref 39–117)

## 2011-06-08 LAB — TSH: TSH: 0.78 u[IU]/mL (ref 0.35–5.50)

## 2011-07-25 ENCOUNTER — Encounter: Payer: Self-pay | Admitting: Internal Medicine

## 2011-07-25 ENCOUNTER — Ambulatory Visit (INDEPENDENT_AMBULATORY_CARE_PROVIDER_SITE_OTHER): Payer: Medicare PPO | Admitting: Internal Medicine

## 2011-07-25 VITALS — BP 140/88 | HR 77 | Temp 98.5°F | Wt 223.2 lb

## 2011-07-25 DIAGNOSIS — M109 Gout, unspecified: Secondary | ICD-10-CM

## 2011-07-25 DIAGNOSIS — J209 Acute bronchitis, unspecified: Secondary | ICD-10-CM

## 2011-07-25 MED ORDER — AZITHROMYCIN 250 MG PO TABS: ORAL_TABLET | ORAL | Status: DC

## 2011-07-25 MED ORDER — HYDROCODONE-HOMATROPINE 5-1.5 MG/5ML PO SYRP: 5.0000 mL | ORAL_SOLUTION | Freq: Four times a day (QID) | ORAL | Status: AC | PRN

## 2011-07-25 MED ORDER — AZITHROMYCIN 250 MG PO TABS: ORAL_TABLET | ORAL | Status: AC

## 2011-07-25 NOTE — Progress Notes (Signed)
  Subjective:    Patient ID: Kyle Long, male    DOB: [birth year], 65 y.o.   MRN: [id number]  HPI Respiratory tract infection Onset/symptoms:2 weeks ago as nasal congestion; cough started 2 weeks ago Exposures (illness/environmental/extrinsic):son in law ill Progression of symptoms: NP cough Treatments/response:delsym w/o benefit, Dayquil, Flonase w/o benefit Present symptoms: Fever/chills/sweats:no Frontal headache:no Facial pain:no Nasal purulence:no Sore throat:no Dental pain:no Lymphadenopathy:no Wheezing/shortness of breath:no Cough/sputum/hemoptysis:still NP Associated extrinsic/allergic symptoms:itchy eyes/ sneezing:no Past medical history: Seasonal allergies; no/asthma:no Smoking history:quit 1981           Review of Systems has had a gout flare left toe since yesterday. Aleve was of benefit.     Objective:   Physical ExamGeneral appearance is of good health and nourishment; no acute distress or increased work of breathing is present.  No  lymphadenopathy about the head, neck, or axilla noted.   Eyes: No conjunctival inflammation or lid edema is present.   Ears:  External ear exam shows no significant lesions or deformities.  Otoscopic examination reveals clear canals, tympanic membranes are intact bilaterally without bulging, retraction, inflammation or discharge.  Nose:  External nasal examination shows no deformity or inflammation. Nasal mucosa are dry without lesions or exudates. No septal dislocation .No obstruction to airflow.   Oral exam: Dental hygiene is good; lips and gums are healthy appearing.There is no oropharyngeal erythema or exudate noted.    Heart:  Normal rate and regular rhythm. S1 and S2 normal without gallop, murmur, click, rub or other extra sounds.   Lungs:Chest clear to auscultation; no wheezes, rhonchi,rales ,or rubs present.No increased work of breathing.    Extremities:  No cyanosis, edema, or clubbing  noted . Classic podagra  suggested @ the base of the left great toe   Skin: Warm & dry w/o jaundice or tenting. Mild erythema is noted at the base of the left great toe         Assessment & Plan:   #1 bronchitis atypical. Advice will be prescribed. If the cough persists; consideration should be given to stopping the ACE inhibitor.  #2 podagra, acute.  Plan see orders recommendation

## 2011-07-25 NOTE — Progress Notes (Signed)
Addended by: Maurice Small on: 07/25/2011 04:11 PM   Modules accepted: Orders

## 2011-07-25 NOTE — Patient Instructions (Addendum)
To prevent gout the minimal uric acid goal is < 7; preferred is < 6, ideally < 5 to prevent gout.  The most common cause of elevated uric acid is the ingestion of sugar from high fructose corn syrup sources. You should consume less than 40 grams  of sugar per day from foods and drinks with high fructose corn syrup as number 1,2, 3, or #4 on the label. Plain Mucinex for thick secretions ;force NON dairy fluids for next 48 hrs. Use a Neti pot daily as needed for sinus congestion . For the gout take Aleve one to 2 every 8 hours with food.

## 2011-07-26 LAB — URIC ACID: Uric Acid, Serum: 7 mg/dL (ref 4.0–7.8)

## 2011-10-02 ENCOUNTER — Ambulatory Visit (INDEPENDENT_AMBULATORY_CARE_PROVIDER_SITE_OTHER): Payer: Medicare PPO | Admitting: Family

## 2011-10-02 ENCOUNTER — Encounter: Payer: Self-pay | Admitting: Family

## 2011-10-02 ENCOUNTER — Ambulatory Visit (HOSPITAL_BASED_OUTPATIENT_CLINIC_OR_DEPARTMENT_OTHER)
Admission: RE | Admit: 2011-10-02 | Discharge: 2011-10-02 | Disposition: A | Discharge status: Home / Self Care | Payer: Medicare HMO | Source: Ambulatory Visit | Attending: Family | Admitting: Family

## 2011-10-02 ENCOUNTER — Telehealth: Payer: Self-pay | Admitting: Family

## 2011-10-02 VITALS — BP 156/94 | HR 85 | Temp 98.1°F | Resp 16 | Wt 223.1 lb

## 2011-10-02 DIAGNOSIS — R05 Cough: Secondary | ICD-10-CM

## 2011-10-02 DIAGNOSIS — R911 Solitary pulmonary nodule: Secondary | ICD-10-CM

## 2011-10-02 DIAGNOSIS — R5381 Other malaise: Secondary | ICD-10-CM | POA: Insufficient documentation

## 2011-10-02 DIAGNOSIS — R059 Cough, unspecified: Secondary | ICD-10-CM

## 2011-10-02 DIAGNOSIS — I10 Essential (primary) hypertension: Secondary | ICD-10-CM | POA: Insufficient documentation

## 2011-10-02 DIAGNOSIS — J40 Bronchitis, not specified as acute or chronic: Secondary | ICD-10-CM

## 2011-10-02 MED ORDER — BENZONATATE 100 MG PO CAPS: 100.0000 mg | ORAL_CAPSULE | Freq: Three times a day (TID) | ORAL | Status: AC | PRN

## 2011-10-02 MED ORDER — AZITHROMYCIN 250 MG PO TABS: ORAL_TABLET | ORAL | Status: AC

## 2011-10-02 NOTE — Patient Instructions (Signed)
Please complete your chest x-ray on the first floor.  Call if symptoms worsen, or if no improvement in the next 2-3 days.

## 2011-10-02 NOTE — Assessment & Plan Note (Signed)
Symptoms most consistent with bronchitis.  Will treat with zithromax and tessalon prn.  Will obtain CXR to rule out pneumonia.  Also recommended that he try adding claritin once daily as needed to see if this helps with his post-nasal drip.  He is instructed to call if symptoms worsen, or if no improvement in 2-3 days.

## 2011-10-02 NOTE — Telephone Encounter (Signed)
Please call pt and let him know that his chest x-ray is negative for pneumonia.  He should still plan to take the z-pak though. There is question of a lung nodule on the left.  The radiologist has recommended that he have CT scan which can further evaluate the nodule.  I have pended ordered below.

## 2011-10-02 NOTE — Progress Notes (Signed)
  Subjective:    Patient ID: Kyle Long, male    DOB: 1945/11/17, 66 y.o.   MRN: [id number]  HPI  Kyle Long is a 66 yr old male who presents today with chief complaint of cough.  He reports that his cough started 2 weeks ago. He denies associated fever or sinus congestion, but does report mild associated post nasal drip and poor energy.  He is a former smoker.  Denies wheezing or shortness of breath.  Reports that he had similar symptoms 2 months ago which resolved with a Zpak prescribed by Dr. Alwyn Ren.     Review of Systems See HPI  Past Medical History  Diagnosis Date  . Hypertension   . Peyronie disease   . Gilbert's syndrome   . Hyperlipidemia   . Amnesia     isolated;for 30 mins w/elevated BP  . Hematuria 2009    Dr Merry Lofty  . Hyperglycemia   . Diverticulosis of colon   . Cancer     skin  . Cellulitis 2011    LUE  . Peptic ulcer 1995    History   Social History  . Marital Status: Single    Spouse Name: N/A    Number of Children: N/A  . Years of Education: N/A   Occupational History  . retired    Social History Main Topics  . Smoking status: Former Smoker    Quit date: 07/31/1979  . Smokeless tobacco: Not on file  . Alcohol Use: 8.4 oz/week    14 Shots of liquor per week  . Drug Use: No  . Sexually Active: Not on file   Other Topics Concern  . Not on file   Social History Narrative   Reg exercise    Past Surgical History  Procedure Date  . Colonoscopy w/ polypectomy 2005    due 2015  . Vasectomy   . Cystoscopy 2009  . Vein surgery     R ankle    Family History  Problem Relation Age of Onset  . Stroke Mother   . Hypertension Mother   . Cancer Mother     breast  . Cancer Father     melanoma  . Coronary artery disease Father     MI @ 40  . Hypertension Sister   . Thyroid disease Sister     ??    No Known Allergies  Current Outpatient Prescriptions on File Prior to Visit  Medication Sig Dispense Refill  . diltiazem  (TIAZAC) 240 MG 24 hr capsule Take 1 capsule (240 mg total) by mouth daily.  90 capsule  3  . lisinopril (PRINIVIL,ZESTRIL) 40 MG tablet Take 1 tablet (40 mg total) by mouth daily.  90 tablet  3    BP 156/94  Pulse 85  Temp(Src) 98.1 F (36.7 C) (Oral)  Resp 16  Wt 223 lb 1.3 oz (101.188 kg)  SpO2 99%       Objective:   Physical Exam  Constitutional: He appears well-developed and well-nourished. No distress.  Cardiovascular: Normal rate and regular rhythm.   No murmur heard. Pulmonary/Chest: Effort normal and breath sounds normal. No respiratory distress. He has no wheezes. He has no rales.  Skin:       Sun tanned skin.  Psychiatric: He has a normal mood and affect. His behavior is normal. Judgment and thought content normal.          Assessment & Plan:

## 2011-10-02 NOTE — Telephone Encounter (Signed)
Notified pt, he is agreeable to proceed with CT and is ok to do in imaging at [facility].

## 2011-10-03 ENCOUNTER — Telehealth: Payer: Self-pay | Admitting: Internal Medicine

## 2011-10-03 MED ORDER — HYDROCOD POLST-CHLORPHEN POLST 10-8 MG/5ML PO LQCR: ORAL | Status: DC

## 2011-10-03 NOTE — Telephone Encounter (Signed)
Patients wife Drue Flirt called stating that patient is not any better. Patient was given an rx for coughing but that has not helped. He would like something else called into [facility] [address] in [place].

## 2011-10-03 NOTE — Telephone Encounter (Signed)
Please call in tussionex as pended below to pt's pharmacy.

## 2011-10-03 NOTE — Telephone Encounter (Signed)
Call placed to patient, he was informed of cough syrup Rx to pharmacy. He has requested the Rx be sent to [facility] in [facility].

## 2011-10-04 ENCOUNTER — Ambulatory Visit (HOSPITAL_BASED_OUTPATIENT_CLINIC_OR_DEPARTMENT_OTHER)
Admission: RE | Admit: 2011-10-04 | Discharge: 2011-10-04 | Disposition: A | Discharge status: Home / Self Care | Payer: Medicare HMO | Source: Ambulatory Visit | Attending: Family | Admitting: Family

## 2011-10-04 ENCOUNTER — Telehealth: Payer: Self-pay | Admitting: *Deleted

## 2011-10-04 DIAGNOSIS — R911 Solitary pulmonary nodule: Secondary | ICD-10-CM

## 2011-10-04 DIAGNOSIS — J984 Other disorders of lung: Secondary | ICD-10-CM | POA: Insufficient documentation

## 2011-10-04 NOTE — Telephone Encounter (Signed)
Received call from [facility] with chest CT results performed today. Results are available in EPIC.  Please advise.

## 2011-10-05 ENCOUNTER — Telehealth: Payer: Self-pay | Admitting: Family

## 2011-10-05 DIAGNOSIS — R918 Other nonspecific abnormal finding of lung field: Secondary | ICD-10-CM

## 2011-10-05 NOTE — Telephone Encounter (Signed)
CT reviewed with Dr. Denny Levy of interventional radiology who recommended PET prior to biopsy.  Did not feel lesion would be amenable to bronch.  Spoke with pt.  Reviewed CT findings and need to proceed with PET scan and probable biopsy pending PET scan.  Questions answered.  He reports that he still has "cold symptoms" but cough is improved with the tussionex syrup.  Recommended that he call the office on Monday if he does not continue to improve. Pt verbalizes understanding.

## 2011-10-10 ENCOUNTER — Other Ambulatory Visit (INDEPENDENT_AMBULATORY_CARE_PROVIDER_SITE_OTHER): Payer: Medicare PPO

## 2011-10-10 ENCOUNTER — Telehealth: Payer: Self-pay | Admitting: Family

## 2011-10-10 ENCOUNTER — Ambulatory Visit (HOSPITAL_BASED_OUTPATIENT_CLINIC_OR_DEPARTMENT_OTHER)
Admission: RE | Admit: 2011-10-10 | Discharge: 2011-10-10 | Disposition: A | Discharge status: Home / Self Care | Payer: Medicare HMO | Source: Ambulatory Visit | Attending: Family | Admitting: Family

## 2011-10-10 DIAGNOSIS — R918 Other nonspecific abnormal finding of lung field: Secondary | ICD-10-CM

## 2011-10-10 DIAGNOSIS — N400 Enlarged prostate without lower urinary tract symptoms: Secondary | ICD-10-CM

## 2011-10-10 DIAGNOSIS — R222 Localized swelling, mass and lump, trunk: Secondary | ICD-10-CM

## 2011-10-10 DIAGNOSIS — R911 Solitary pulmonary nodule: Secondary | ICD-10-CM | POA: Insufficient documentation

## 2011-10-10 DIAGNOSIS — K7689 Other specified diseases of liver: Secondary | ICD-10-CM | POA: Insufficient documentation

## 2011-10-10 MED ORDER — FLUDEOXYGLUCOSE F - 18 (FDG) INJECTION
13.4400 | Freq: Once | INTRAVENOUS | Status: AC | PRN
  Administered 2011-10-10: 13.44 via INTRAVENOUS

## 2011-10-10 NOTE — Telephone Encounter (Signed)
Notified pt and scheduled lab appt for today at 2:15pm at [facility] lab.

## 2011-10-10 NOTE — Telephone Encounter (Signed)
Reviewed PET with Dr. Alwyn Ren.  Will plan to refer pt to interventional radiology for CT guided biopsy.  Will also have pt return to the lab for PSA.  He had neg DRE in November at [facility] with Dr. Alwyn Ren.  Spoke with pt, reviewed findings and plans.  Questions answered.  Bethann Berkshire, could you please arrange a PSA to be drawn at [facility] (Diagnosis [facility]) and notify patient? thanks

## 2011-10-11 ENCOUNTER — Other Ambulatory Visit: Payer: Self-pay | Admitting: Physician Assistant

## 2011-10-11 LAB — PSA: PSA: 1.39 ng/mL (ref 0.10–4.00)

## 2011-10-12 NOTE — Telephone Encounter (Signed)
Addended by: Sandford Craze on: 10/12/2011 01:19 PM   Modules accepted: Orders

## 2011-10-15 ENCOUNTER — Other Ambulatory Visit: Payer: Self-pay | Admitting: Interventional Radiology

## 2011-10-15 ENCOUNTER — Ambulatory Visit (HOSPITAL_COMMUNITY)
Admission: RE | Admit: 2011-10-15 | Discharge: 2011-10-15 | Disposition: A | Discharge status: Home / Self Care | Payer: Medicare HMO | Source: Ambulatory Visit | Attending: Family | Admitting: Family

## 2011-10-15 ENCOUNTER — Encounter (HOSPITAL_COMMUNITY): Payer: Self-pay

## 2011-10-15 ENCOUNTER — Ambulatory Visit (HOSPITAL_COMMUNITY)
Admission: RE | Admit: 2011-10-15 | Discharge: 2011-10-15 | Disposition: A | Discharge status: Home / Self Care | Payer: Medicare HMO | Source: Ambulatory Visit | Attending: Interventional Radiology | Admitting: Interventional Radiology

## 2011-10-15 DIAGNOSIS — R918 Other nonspecific abnormal finding of lung field: Secondary | ICD-10-CM

## 2011-10-15 DIAGNOSIS — E785 Hyperlipidemia, unspecified: Secondary | ICD-10-CM | POA: Insufficient documentation

## 2011-10-15 DIAGNOSIS — Z79899 Other long term (current) drug therapy: Secondary | ICD-10-CM | POA: Insufficient documentation

## 2011-10-15 DIAGNOSIS — R05 Cough: Secondary | ICD-10-CM | POA: Insufficient documentation

## 2011-10-15 DIAGNOSIS — R059 Cough, unspecified: Secondary | ICD-10-CM | POA: Insufficient documentation

## 2011-10-15 DIAGNOSIS — R911 Solitary pulmonary nodule: Secondary | ICD-10-CM | POA: Insufficient documentation

## 2011-10-15 DIAGNOSIS — I10 Essential (primary) hypertension: Secondary | ICD-10-CM | POA: Insufficient documentation

## 2011-10-15 DIAGNOSIS — R222 Localized swelling, mass and lump, trunk: Secondary | ICD-10-CM | POA: Insufficient documentation

## 2011-10-15 LAB — CBC
HCT: 43.9 % (ref 39.0–52.0)
Hemoglobin: 15.4 g/dL (ref 13.0–17.0)
MCH: 32.2 pg (ref 26.0–34.0)
RBC: 4.79 MIL/uL (ref 4.22–5.81)

## 2011-10-15 LAB — PROTIME-INR: Prothrombin Time: 12.7 seconds (ref 11.6–15.2)

## 2011-10-15 MED ORDER — FENTANYL CITRATE 0.05 MG/ML IJ SOLN
INTRAMUSCULAR | Status: AC
  Filled 2011-10-15: qty 6

## 2011-10-15 MED ORDER — SODIUM CHLORIDE 0.9 % IV SOLN
INTRAVENOUS | Status: DC
  Administered 2011-10-15: 500 mL via INTRAVENOUS

## 2011-10-15 MED ORDER — FENTANYL CITRATE 0.05 MG/ML IJ SOLN
INTRAMUSCULAR | Status: AC | PRN
  Administered 2011-10-15: 50 ug via INTRAVENOUS

## 2011-10-15 MED ORDER — MIDAZOLAM HCL 5 MG/5ML IJ SOLN
INTRAMUSCULAR | Status: AC | PRN
  Administered 2011-10-15: 2 mg via INTRAVENOUS

## 2011-10-15 MED ORDER — MIDAZOLAM HCL 2 MG/2ML IJ SOLN
INTRAMUSCULAR | Status: AC
  Filled 2011-10-15: qty 2

## 2011-10-15 NOTE — Procedures (Signed)
LLL nodule bx No comp

## 2011-10-15 NOTE — Discharge Instructions (Signed)
Biopsy A biopsy is a procedure in which small samples of tissue are removed from the body. The tissue is examined under a microscope. A biopsy may be done to determine the cause (diagnosis) of a condition or mass (tumor). A biopsy may also be done to determine the best treatment for you. In some instances, a biopsy may be performed on normal tissue to determine if cancer has spread or if a transplanted organ is being rejected. There are 2 ways to obtain samples:  Fine needle biopsy. Samples are removed using a thin needle inserted through the skin.   Open biopsy. Samples are removed after a cut (incision) is made through the skin.  LET YOUR CAREGIVER KNOW ABOUT:  Allergies to food or medicine.   Medicines taken, including vitamins, herbs, eyedrops, over-the-counter medicines, and creams.   Use of steroids (by mouth or creams).   Previous problems with anesthetics or numbing medicines.   History of bleeding problems or blood clots.   Previous surgery.   Other health problems, including diabetes and kidney problems.   Possibility of pregnancy, if this applies.  RISKS AND COMPLICATIONS  Bleeding from the biopsy site. The risk of bleeding is higher if you have a bleeding disorder or are taking any blood thinning medicines (anticoagulants).   Infection.   Injury to organs or structures near the biopsy site.   Chronic pain at the biopsy site. This is defined as pain that lasts for more than 3 months.   Very rarely, a second biopsy may be required if not enough tissue was collected during the first biopsy.  BEFORE THE PROCEDURE Ask your caregiver what time you need to arrive for your procedure. Ask your caregiver whether you need to stop eating or drinking (fast) before your procedure. Ask your caregiver about changing or stopping your regular medicines. A blood sample may be done to determine your blood clotting time. Medicine may be given to help you relax (sedative). PROCEDURE During  a fine needle biopsy, you will be awake during the procedure. You will be positioned to allow the best possible access to the biopsy site. Let your caregiver know if the position is not comfortable. The biopsy site will be cleaned. A needle is inserted through your skin. You may feel mild discomfort during this procedure. The needle is withdrawn once tissue samples have been removed. Pressure may be applied to the biopsy site to reduce swelling and to ensure that bleeding has stopped. The samples will be sent to be examined. During an open biopsy, you may be given medicine that numbs the area (local anesthetic) or medicine that makes you sleep (general anesthetic). An incision is made through the skin. A tissue sample or the entire mass is removed. The sample or mass will be sent to be examined. Sometimes, the sample or mass may be examined during the procedure. If the sample or mass contains cancer cells, further tissue or structures may be removed. The incision is then closed with stitches (sutures) or skin glue (adhesive). AFTER THE PROCEDURE Your recovery will be assessed and monitored. If there are no problems, you should be able to go home shortly after the procedure (outpatient). You will need to arrange for someone to drive you home if you received a sedative or pain relieving medicine during the procedure. Ask when your test results will be ready. Make sure you get your test results. Document Released: 07/13/2000 Document Revised: 07/05/2011 Document Reviewed: 01/11/2011 Exit[employer] Patient Information 2012 [employer]. 

## 2011-10-15 NOTE — H&P (Signed)
Kyle Long is an 66 y.o. male.   Chief Complaint: left lung mass HPI: Patient with history of cough, prior tobacco use and hypermetabolic left lower lung nodule noted on recent PET presents today for CT guided left lung biopsy.  Past Medical History  Diagnosis Date  . Hypertension   . Peyronie disease   . Gilbert's syndrome   . Hyperlipidemia   . Amnesia     isolated;for 30 mins w/elevated BP  . Hematuria 2009    Dr Merry Lofty  . Hyperglycemia   . Diverticulosis of colon   . Cancer     skin  . Cellulitis 2011    LUE  . Peptic ulcer 1995    Past Surgical History  Procedure Date  . Colonoscopy w/ polypectomy 2005    due 2015  . Vasectomy   . Cystoscopy 2009  . Vein surgery     R ankle    Family History  Problem Relation Age of Onset  . Stroke Mother   . Hypertension Mother   . Cancer Mother     breast  . Cancer Father     melanoma  . Coronary artery disease Father     MI @ 61  . Hypertension Sister   . Thyroid disease Sister     ??   Social History:  reports that he quit smoking about 32 years ago. He does not have any smokeless tobacco history on file. He reports that he drinks about 8.4 ounces of alcohol per week. He reports that he does not use illicit drugs.  Allergies: No Known Allergies  Medications Prior to Admission  Medication Sig Dispense Refill  . chlorpheniramine-HYDROcodone (TUSSIONEX PENNKINETIC ER) 10-8 MG/5ML LQCR every 12 hours as needed for cough- may cause drowsiness- do not drive while using.  140 mL  0  . diltiazem (TIAZAC) 240 MG 24 hr capsule Take 1 capsule (240 mg total) by mouth daily.  90 capsule  3  . lisinopril (PRINIVIL,ZESTRIL) 40 MG tablet Take 1 tablet (40 mg total) by mouth daily.  90 tablet  3   Medications Prior to Admission  Medication Dose Route Frequency Provider Last Rate Last Dose  . 0.9 %  sodium chloride infusion   Intravenous Continuous Art A Hoss, MD 20 mL/hr at 10/15/11 0812 500 mL at 10/15/11 4098     Results for orders placed during the hospital encounter of 10/15/11 (from the past 48 hour(s))  APTT     Status: Normal   Collection Time   10/15/11  8:00 AM      Component Value Range Comment   aPTT 29  24 - 37 (seconds)   CBC     Status: Normal   Collection Time   10/15/11  8:00 AM      Component Value Range Comment   WBC 8.5  4.0 - 10.5 (K/uL)    RBC 4.79  4.22 - 5.81 (MIL/uL)    Hemoglobin 15.4  13.0 - 17.0 (g/dL)    HCT 11.9  14.7 - 82.9 (%)    MCV 91.6  78.0 - 100.0 (fL)    MCH 32.2  26.0 - 34.0 (pg)    MCHC 35.1  30.0 - 36.0 (g/dL)    RDW 56.2  13.0 - 86.5 (%)    Platelets 169  150 - 400 (K/uL)   PROTIME-INR     Status: Normal   Collection Time   10/15/11  8:00 AM      Component Value Range Comment  Prothrombin Time 12.7  11.6 - 15.2 (seconds)    INR 0.93  0.00 - 1.49     No results found.  Review of Systems  Constitutional: Negative for fever and chills.  Respiratory: Positive for cough. Negative for shortness of breath.   Cardiovascular: Negative for chest pain.  Gastrointestinal: Negative for nausea, vomiting and abdominal pain.  Neurological: Negative for headaches.  Endo/Heme/Allergies: Does not bruise/bleed easily.    Blood pressure 206/93, pulse 78, temperature 98.3 F (36.8 C), temperature source Oral, resp. rate 19, height 5' 10.5" (1.791 m), weight 220 lb (99.791 kg), SpO2 97.00%. Physical Exam  Constitutional: He is oriented to person, place, and time. He appears well-developed and well-nourished.  Cardiovascular: Normal rate and regular rhythm.   Respiratory: Effort normal and breath sounds normal.  GI: Soft. Bowel sounds are normal.  Musculoskeletal: Normal range of motion.  Neurological: He is alert and oriented to person, place, and time.     Assessment/Plan Patient with hypermetabolic left lower lung nodule; plan is for CT guided biopsy.  Fanchon Papania,D KEVIN 10/15/2011, 8:34 AM

## 2011-10-15 NOTE — Progress Notes (Signed)
Call from dr Bonnielee Haff  States cxr wnl and proceed c orders.  Pt and family informed.

## 2011-10-17 ENCOUNTER — Telehealth: Payer: Self-pay | Admitting: Family

## 2011-10-17 DIAGNOSIS — R918 Other nonspecific abnormal finding of lung field: Secondary | ICD-10-CM

## 2011-10-17 NOTE — Telephone Encounter (Signed)
Reviewed pathology results with pt and plans to refer him to Dr. Edwyna Shell for evaluation/mass excision.  Pt verbalizes understanding.  Questions answered.

## 2011-10-18 ENCOUNTER — Telehealth: Payer: Self-pay | Admitting: *Deleted

## 2011-10-18 NOTE — Telephone Encounter (Signed)
Pt left voice message requesting status of referral to Dr Edwyna Shell. Referral just placed yesterday and notes were faxed today. Attempted to reach pt re: status and left message on voicemail for pt to return my call.

## 2011-10-19 ENCOUNTER — Telehealth: Payer: Self-pay | Admitting: Family

## 2011-10-19 NOTE — Telephone Encounter (Signed)
Per Efraim Kaufmann, she has spoken to pt today and there are no further questions.

## 2011-10-19 NOTE — Telephone Encounter (Signed)
Spoke with pt, he had some additional questions.  Questions answered. He has apt next Thursday to meet with Dr. Dorris Fetch for consultation. He did report one episode of slightly pink sputum. I reassured him that this is likely due to recent biopsy, but to let us know if symptoms worsen.

## 2011-10-25 ENCOUNTER — Institutional Professional Consult (permissible substitution) (INDEPENDENT_AMBULATORY_CARE_PROVIDER_SITE_OTHER): Payer: Medicare HMO | Admitting: Thoracic Surgery (Cardiothoracic Vascular Surgery)

## 2011-10-25 ENCOUNTER — Encounter (HOSPITAL_COMMUNITY): Payer: Self-pay | Admitting: Pharmacy Technician

## 2011-10-25 VITALS — BP 191/112 | HR 92 | Resp 16 | Ht 70.5 in | Wt 220.0 lb

## 2011-10-25 DIAGNOSIS — R918 Other nonspecific abnormal finding of lung field: Secondary | ICD-10-CM

## 2011-10-25 DIAGNOSIS — R222 Localized swelling, mass and lump, trunk: Secondary | ICD-10-CM

## 2011-10-25 NOTE — Progress Notes (Signed)
PCP is Marga Melnick, MD, MD Referring Provider is Sandford Craze, NP  Chief Complaint  Patient presents with  . Lung Mass    eval and treat...CT CHEST [facility], PET    HPI: 66 year old former smoker presents with a chief complaint of a left lung nodule. Kyle Long was in his usual state of good health until December of last year when he developed a cough. He was treated for presumed bronchitis. However his cough persisted. He then will see him back again in March. At that time a chest x-ray showed a possible left lower lobe nodule. A CT scan was done which showed a left lower lobe nodule which was suspicious for bronchogenic carcinoma. A PET CT showed mild increased uptake in the left lower lobe nodule. A needle biopsy was done, pathology from that showed atypical cells suspicious for adenocarcinoma.  He complains of cough, which typically nonproductive. Denies hemoptysis, wheezing, shortness of breath, chest pain, chest tightness or pressure. He has migrating arthritis but no unusual bone or joint pain. He has no new neurologic symptoms.   Past Medical History  Diagnosis Date  . Hypertension   . Peyronie disease   . Gilbert's syndrome   . Hyperlipidemia   . Amnesia     isolated;for 30 mins w/elevated BP  . Hematuria 2009    Dr Merry Lofty  . Hyperglycemia   . Diverticulosis of colon   . Cancer     skin  . Cellulitis 2011    LUE  . Peptic ulcer 1995    Past Surgical History  Procedure Date  . Colonoscopy w/ polypectomy 2005    due 2015  . Vasectomy   . Cystoscopy 2009  . Vein surgery     R ankle    Family History  Problem Relation Age of Onset  . Stroke Mother   . Hypertension Mother   . Cancer Mother     breast  . Cancer Father     melanoma  . Coronary artery disease Father     MI @ 22  . Hypertension Sister   . Thyroid disease Sister     ??    Social History History  Substance Use Topics  . Smoking status: Former Smoker    Quit  date: 07/31/1979  . Smokeless tobacco: Not on file  . Alcohol Use: 8.4 oz/week    14 Shots of liquor per week    Current Outpatient Prescriptions  Medication Sig Dispense Refill  . diltiazem (TIAZAC) 240 MG 24 hr capsule Take 1 capsule (240 mg total) by mouth daily.  90 capsule  3  . lisinopril (PRINIVIL,ZESTRIL) 40 MG tablet Take 1 tablet (40 mg total) by mouth daily.  90 tablet  3    No Known Allergies  Review of Systems  Constitutional: Negative for fever, chills, activity change, appetite change, fatigue and unexpected weight change.  HENT: Negative.   Respiratory: Positive for cough. Negative for chest tightness and shortness of breath.   Cardiovascular: Negative for chest pain, palpitations and leg swelling.  Gastrointestinal:       Trouble swallowing- rarely occurs  Genitourinary: Negative.   Musculoskeletal: Positive for arthralgias.  Neurological: Negative.   Hematological: Does not bruise/bleed easily.  All other systems reviewed and are negative.    BP 191/112  Pulse 92  Resp 16  Ht 5' 10.5" (1.791 m)  Wt 220 lb (99.791 kg)  BMI 31.12 kg/m2  SpO2 99% Physical Exam  Constitutional: He is oriented to person, place,  and time. He appears well-developed and well-nourished. No distress.  HENT:  Head: Normocephalic and atraumatic.  Eyes: EOM are normal. Pupils are equal, round, and reactive to light.  Neck: Neck supple. No JVD present. No tracheal deviation present.  Cardiovascular: Normal rate, regular rhythm and intact distal pulses.  Exam reveals no gallop and no friction rub.   No murmur heard. Pulmonary/Chest: Effort normal and breath sounds normal. He has no wheezes. He has no rales.  Abdominal: Soft. There is no tenderness.  Musculoskeletal: He exhibits no edema.  Lymphadenopathy:    He has no cervical adenopathy.  Neurological: He is alert and oriented to person, place, and time. No cranial nerve deficit.  Skin: Skin is warm and dry.     Diagnostic  Tests: CT IMPRESSION:  1. Microlobulated nodule in the left lower lobe accounts for the  questioned abnormality on recent chest radiograph and is worrisome  for primary bronchogenic carcinoma. Consultation with the [facility] [phone]) is  recommended. These results will be called to the ordering clinician  or representative by the Radiologist Assistant, and communication  documented in the PACS Dashboard.  2. 5 mm right upper lobe nodule, nonspecific.  3. Borderline enlarged high right paratracheal lymph node.  PET/CT IMPRESSION:  1. Borderline malignant range hypermetabolism within the left  lower lobe nodule. Together with the CT morphology, primary  bronchogenic carcinoma is favored. If this is a non-small cell  lung cancer, it is most consistent with T1aN0M0 or a stage IA  lesion.  2. Air fluid levels and scattered opacification within the  paranasal sinuses.   Impression: 66 year old former smoker with a 1.7 cm left lower lobe mass. This is hypermetabolic by PET. Biopsy showed atypical cells suspicious for carcinoma. This in all likelihood is in fact a bronchogenic carcinoma. It does appear to be a stage I lesion clinically.  I reviewed the films with Kyle Long. We discussed the likelihood that this is cancer and treatment thereof. I recommended to them that we proceed with surgical resection. We would do a left VATS, wedge resection to confirm the diagnosis by frozen section, followed by a left lower lobectomy with the diagnosis confirmed.  I discussed with them the general nature of the operation, need for general anesthesia, incisions to be used, expected hospital stay, and overall recovery. I discussed in detail with them the indications, risks, benefits, and alternatives. They understand the risk include, but are not limited to, death, stroke, MI, DVT, PE, infection, bleeding, possible need for transfusion, prolonged air  leak, cardiac arrhythmias, as well as other unforeseeable complications. He agrees to proceed.   Plan: Plan for left VATS, wedge resection, lower lobectomy on Friday, April 5.

## 2011-10-29 ENCOUNTER — Other Ambulatory Visit: Payer: Self-pay

## 2011-10-29 DIAGNOSIS — D381 Neoplasm of uncertain behavior of trachea, bronchus and lung: Secondary | ICD-10-CM

## 2011-10-31 ENCOUNTER — Encounter (HOSPITAL_COMMUNITY)
Admission: RE | Admit: 2011-10-31 | Discharge: 2011-10-31 | Disposition: A | Discharge status: Home / Self Care | Payer: Medicare HMO | Source: Ambulatory Visit | Attending: Thoracic Surgery (Cardiothoracic Vascular Surgery) | Admitting: Thoracic Surgery (Cardiothoracic Vascular Surgery)

## 2011-10-31 ENCOUNTER — Encounter (HOSPITAL_COMMUNITY): Payer: Self-pay

## 2011-10-31 ENCOUNTER — Other Ambulatory Visit: Payer: Self-pay

## 2011-10-31 VITALS — BP 185/106 | HR 76 | Temp 98.1°F | Resp 20 | Ht 70.0 in | Wt 219.4 lb

## 2011-10-31 DIAGNOSIS — D381 Neoplasm of uncertain behavior of trachea, bronchus and lung: Secondary | ICD-10-CM

## 2011-10-31 HISTORY — DX: Gastro-esophageal reflux disease without esophagitis: K21.9

## 2011-10-31 HISTORY — DX: Unspecified osteoarthritis, unspecified site: M19.90

## 2011-10-31 HISTORY — DX: Acute upper respiratory infection, unspecified: J06.9

## 2011-10-31 HISTORY — DX: Myoneural disorder, unspecified: G70.9

## 2011-10-31 LAB — BLOOD GAS, ARTERIAL
Acid-Base Excess: 0.8 mmol/L (ref 0.0–2.0)
Drawn by: 344381
O2 Saturation: 95 %
TCO2: 24.9 mmol/L (ref 0–100)
pO2, Arterial: 64.8 mmHg — ABNORMAL LOW (ref 80.0–100.0)

## 2011-10-31 LAB — COMPREHENSIVE METABOLIC PANEL
BUN: 13 mg/dL (ref 6–23)
CO2: 29 mEq/L (ref 19–32)
Calcium: 9.6 mg/dL (ref 8.4–10.5)
Creatinine, Ser: 0.89 mg/dL (ref 0.50–1.35)
GFR calc Af Amer: >90 mL/min (ref >90)
GFR calc non Af Amer: 87 mL/min — ABNORMAL LOW (ref >90)
Glucose, Bld: 98 mg/dL (ref 70–99)
Total Bilirubin: 0.5 mg/dL (ref 0.3–1.2)

## 2011-10-31 LAB — URINALYSIS, ROUTINE W REFLEX MICROSCOPIC
Glucose, UA: NEGATIVE mg/dL
Hgb urine dipstick: NEGATIVE
Ketones, ur: NEGATIVE mg/dL
Protein, ur: NEGATIVE mg/dL
pH: 6 (ref 5.0–8.0)

## 2011-10-31 LAB — TYPE AND SCREEN: ABO/RH(D): A POS

## 2011-10-31 LAB — CBC
HCT: 41.5 % (ref 39.0–52.0)
Hemoglobin: 14.6 g/dL (ref 13.0–17.0)
MCH: 32.2 pg (ref 26.0–34.0)
MCV: 91.4 fL (ref 78.0–100.0)
RBC: 4.54 MIL/uL (ref 4.22–5.81)

## 2011-10-31 LAB — ABO/RH: ABO/RH(D): A POS

## 2011-10-31 LAB — SURGICAL PCR SCREEN: MRSA, PCR: NEGATIVE

## 2011-10-31 LAB — PROTIME-INR: INR: 0.96 (ref 0.00–1.49)

## 2011-10-31 LAB — APTT: aPTT: 28 seconds (ref 24–37)

## 2011-10-31 NOTE — Pre-Procedure Instructions (Signed)
[address]  10/31/2011   Your procedure is scheduled on: Friday  11/02/11   Report to [facility] at 530 AM.  Call this number if you have problems the morning of surgery: [phone]   Remember:   Do not eat food:After Midnight.  May have clear liquids: up to 4 Hours before arrival.  Clear liquids include soda, tea, black coffee, apple or grape juice, broth.  Take these medicines the morning of surgery with A SIP OF WATER:  TIAZAC   Do not wear jewelry, make-up or nail polish.  Do not wear lotions, powders, or perfumes. You may wear deodorant.  Do not shave 48 hours prior to surgery.  Do not bring valuables to the hospital.  Contacts, dentures or bridgework may not be worn into surgery.  Leave suitcase in the car. After surgery it may be brought to your room.  For patients admitted to the hospital, checkout time is 11:00 AM the day of discharge.   Patients discharged the day of surgery will not be allowed to drive home.  Name and phone number of your driver:   Special Instructions: CHG Shower Use Special Wash: 1/2 bottle night before surgery and 1/2 bottle morning of surgery.   Please read over the following fact sheets that you were given: Pain Booklet, MRSA Information and Surgical Site Infection Prevention

## 2011-10-31 NOTE — Progress Notes (Addendum)
SPOKE WITH DEE AT DR HENDRICKSONS OFFICE RE: BP 185/106. DEE STATED PATIENT NEEDED TO SEE DR HOPPER. SPOKE WITH DR HOPPERS NURSE STEPHANIE ABOUT ELEVATED BP AND PATIENT WAS SCHEDULED FOR SURGERY ON Friday. APPOINTMENT WAS MADE TO SEE DR TABORI Thursday 100 PM. (DR HOPPER HAD NO APPT)

## 2011-11-01 ENCOUNTER — Ambulatory Visit (INDEPENDENT_AMBULATORY_CARE_PROVIDER_SITE_OTHER): Payer: Medicare HMO | Admitting: Family Medicine

## 2011-11-01 ENCOUNTER — Encounter: Payer: Self-pay | Admitting: Family Medicine

## 2011-11-01 VITALS — BP 148/100 | HR 80 | Temp 98.8°F | Ht 70.0 in | Wt 219.4 lb

## 2011-11-01 DIAGNOSIS — I10 Essential (primary) hypertension: Secondary | ICD-10-CM

## 2011-11-01 MED ORDER — DILTIAZEM HCL ER COATED BEADS 300 MG PO CP24: 300.0000 mg | ORAL_CAPSULE | Freq: Every day | ORAL | Status: DC

## 2011-11-01 MED ORDER — DEXTROSE 5 % IV SOLN
1.5000 g | INTRAVENOUS | Status: AC
  Administered 2011-11-02: 1.5 g via INTRAVENOUS
  Filled 2011-11-01: qty 1.5

## 2011-11-01 NOTE — Consult Note (Signed)
Anesthesia:  Patient is a 66 year old male scheduled for a left VATS, wedge resection versus LL lobectomy on 11/02/11 for a left lung nodule with biopsies suspicious for adenocarcinoma.  History includes former smoker, HTN, Gilbert's Syndrome, temporary amnesia '10 at [facility] with negative w/u per history, hyperglycemia, HLD, GERD, PUD, Peyronie's disease, skin cancer, arthritis.   His PAT appointment was yesterday, 10/31/11.  His BP was elevated at 185/106.  It was also elevated at his [facility] appointment on 10/25/11 at 191/112.  His PAT nurse notified Kyle Na, RN at [facility], and patient was instructed to follow-up with his PCP, Kyle Long.  He had no appointments available, but his partner, Dr. Beverely Long will be seeing Kyle Long today, 11/01/11, at 1300 for further evaluation and treatment of his HTN.  Currently he is on diltiazem 240 mg Q HS and lisinopril 40 mg Q AM.    EKG on 10/31/11 showed SB at 58 bpm, moderate voltage criteria for LVH.  CXR showed stable nodular lesion in the mid left lung field. Otherwise the lungs are clear.  Labs noted.  PLT 135.  H/H, coags, Cr WNL.  Disposition pending recommendations from Dr. Beverely Long.  She will need to let Dr. Sunday Long office know if any concerns re: proceeding tomorrow.  If still with plans to proceed tomorrow, he will have his BP re-evaluated on arrival and communicated with the Anesthesiologist if still significantly elevated.

## 2011-11-01 NOTE — Progress Notes (Signed)
  Subjective:    Patient ID: Kyle Long, male    DOB: 04-Jul-1946, 66 y.o.   MRN: [id number]  HPI HTN- BP at pre-op yesterday was 180s/100s.  Was told to f/u w/ PCP.  Has surgery scheduled for tomorrow to remove 'spot on lung'.  Admits he's very anxious.  Currently asymptomatic- denies CP, SOB, HAs, visual changes, edema.   Review of Systems For ROS see HPI     Objective:   Physical Exam  Vitals reviewed. Constitutional: He is oriented to person, place, and time. He appears well-developed and well-nourished. No distress.  HENT:  Head: Normocephalic and atraumatic.  Eyes: Conjunctivae and EOM are normal. Pupils are equal, round, and reactive to light.  Neck: Normal range of motion. Neck supple. No thyromegaly present.  Cardiovascular: Normal rate, regular rhythm, normal heart sounds and intact distal pulses.   No murmur heard. Pulmonary/Chest: Effort normal and breath sounds normal. No respiratory distress.  Abdominal: Soft. Bowel sounds are normal. He exhibits no distension.  Musculoskeletal: He exhibits no edema.  Lymphadenopathy:    He has no cervical adenopathy.  Neurological: He is alert and oriented to person, place, and time. No cranial nerve deficit.  Skin: Skin is warm and dry.  Psychiatric: He has a normal mood and affect. His behavior is normal.          Assessment & Plan:

## 2011-11-01 NOTE — Patient Instructions (Signed)
Follow up w/ Dr Alwyn Ren in 2-3 weeks as you are able I think a lot of your BP elevation is due to stress Start the increased dose of Diltiazem tonight Call with any questions or concerns Good Luck!

## 2011-11-01 NOTE — Assessment & Plan Note (Signed)
Deteriorated.  Pt's elevated BP is a normal stress response to his upcoming surgery tomorrow.  Pt is currently asymptomatic.  Already maxed out on Lisinopril.  Increase Dilt to 300mg  and follow BP closely.  Discussed using klonopin for tonight and tomorrow to improve anxiety and subsequently BP but pt is hesitant to do this.  Reviewed supportive care and red flags that should prompt return.  Pt expressed understanding and is in agreement w/ plan.

## 2011-11-02 ENCOUNTER — Encounter (HOSPITAL_COMMUNITY)
Admission: RE | Disposition: A | Discharge status: Home / Self Care | Payer: Self-pay | Source: Ambulatory Visit | Attending: Thoracic Surgery (Cardiothoracic Vascular Surgery)

## 2011-11-02 ENCOUNTER — Encounter (HOSPITAL_COMMUNITY): Payer: Self-pay | Admitting: *Deleted

## 2011-11-02 ENCOUNTER — Ambulatory Visit (HOSPITAL_COMMUNITY): Payer: Medicare HMO | Admitting: Vascular Surgery

## 2011-11-02 ENCOUNTER — Encounter (HOSPITAL_COMMUNITY): Payer: Self-pay | Admitting: Vascular Surgery

## 2011-11-02 ENCOUNTER — Ambulatory Visit (HOSPITAL_COMMUNITY): Payer: Medicare HMO

## 2011-11-02 ENCOUNTER — Inpatient Hospital Stay (HOSPITAL_COMMUNITY)
Admission: RE | Admit: 2011-11-02 | Discharge: 2011-11-06 | DRG: 164 | Disposition: A | Discharge status: Home / Self Care | Payer: Medicare HMO | Source: Ambulatory Visit | Attending: Thoracic Surgery (Cardiothoracic Vascular Surgery) | Admitting: Thoracic Surgery (Cardiothoracic Vascular Surgery)

## 2011-11-02 DIAGNOSIS — J95811 Postprocedural pneumothorax: Secondary | ICD-10-CM | POA: Diagnosis not present

## 2011-11-02 DIAGNOSIS — N400 Enlarged prostate without lower urinary tract symptoms: Secondary | ICD-10-CM | POA: Diagnosis present

## 2011-11-02 DIAGNOSIS — Y9210 Unspecified residential institution as the place of occurrence of the external cause: Secondary | ICD-10-CM | POA: Diagnosis not present

## 2011-11-02 DIAGNOSIS — I10 Essential (primary) hypertension: Secondary | ICD-10-CM | POA: Diagnosis present

## 2011-11-02 DIAGNOSIS — Z860100 Personal history of colon polyps, unspecified: Secondary | ICD-10-CM

## 2011-11-02 DIAGNOSIS — Z9889 Other specified postprocedural states: Secondary | ICD-10-CM

## 2011-11-02 DIAGNOSIS — C3430 Malignant neoplasm of lower lobe, unspecified bronchus or lung: Principal | ICD-10-CM | POA: Diagnosis present

## 2011-11-02 DIAGNOSIS — Y836 Removal of other organ (partial) (total) as the cause of abnormal reaction of the patient, or of later complication, without mention of misadventure at the time of the procedure: Secondary | ICD-10-CM | POA: Diagnosis not present

## 2011-11-02 DIAGNOSIS — Z808 Family history of malignant neoplasm of other organs or systems: Secondary | ICD-10-CM

## 2011-11-02 DIAGNOSIS — Z87891 Personal history of nicotine dependence: Secondary | ICD-10-CM

## 2011-11-02 DIAGNOSIS — D381 Neoplasm of uncertain behavior of trachea, bronchus and lung: Secondary | ICD-10-CM

## 2011-11-02 DIAGNOSIS — Z85828 Personal history of other malignant neoplasm of skin: Secondary | ICD-10-CM

## 2011-11-02 DIAGNOSIS — Z79899 Other long term (current) drug therapy: Secondary | ICD-10-CM

## 2011-11-02 DIAGNOSIS — Z8249 Family history of ischemic heart disease and other diseases of the circulatory system: Secondary | ICD-10-CM

## 2011-11-02 DIAGNOSIS — E785 Hyperlipidemia, unspecified: Secondary | ICD-10-CM | POA: Diagnosis present

## 2011-11-02 DIAGNOSIS — Z01812 Encounter for preprocedural laboratory examination: Secondary | ICD-10-CM

## 2011-11-02 DIAGNOSIS — Z803 Family history of malignant neoplasm of breast: Secondary | ICD-10-CM

## 2011-11-02 DIAGNOSIS — K5730 Diverticulosis of large intestine without perforation or abscess without bleeding: Secondary | ICD-10-CM | POA: Diagnosis present

## 2011-11-02 DIAGNOSIS — N486 Induration penis plastica: Secondary | ICD-10-CM | POA: Diagnosis present

## 2011-11-02 DIAGNOSIS — J40 Bronchitis, not specified as acute or chronic: Secondary | ICD-10-CM | POA: Diagnosis present

## 2011-11-02 DIAGNOSIS — Z823 Family history of stroke: Secondary | ICD-10-CM

## 2011-11-02 HISTORY — PX: LOBECTOMY: SHX5089

## 2011-11-02 SURGERY — VIDEO ASSISTED THORACOSCOPY (VATS)/WEDGE RESECTION
Anesthesia: General | Site: Chest | Laterality: Left | Wound class: Clean Contaminated

## 2011-11-02 MED ORDER — TRAMADOL HCL 50 MG PO TABS: 50.0000 mg | ORAL_TABLET | Freq: Four times a day (QID) | ORAL | Status: DC | PRN

## 2011-11-02 MED ORDER — DIPHENHYDRAMINE HCL 12.5 MG/5ML PO ELIX
12.5000 mg | ORAL_SOLUTION | Freq: Four times a day (QID) | ORAL | Status: DC | PRN
  Filled 2011-11-02: qty 5

## 2011-11-02 MED ORDER — LACTATED RINGERS IV SOLN
INTRAVENOUS | Status: DC | PRN
  Administered 2011-11-02: 13:00:00 via INTRAVENOUS

## 2011-11-02 MED ORDER — LACTATED RINGERS IV SOLN
INTRAVENOUS | Status: DC
  Administered 2011-11-02: 12:00:00 via INTRAVENOUS

## 2011-11-02 MED ORDER — BUPIVACAINE ON-Q PAIN PUMP (FOR ORDER SET NO CHG)
INJECTION | Status: DC
  Filled 2011-11-02: qty 1

## 2011-11-02 MED ORDER — BISACODYL 5 MG PO TBEC
10.0000 mg | DELAYED_RELEASE_TABLET | Freq: Every day | ORAL | Status: DC
  Administered 2011-11-03: 10 mg via ORAL
  Filled 2011-11-02 (×3): qty 2

## 2011-11-02 MED ORDER — FENTANYL CITRATE 0.05 MG/ML IJ SOLN
INTRAMUSCULAR | Status: DC | PRN
  Administered 2011-11-02: 100 ug via INTRAVENOUS
  Administered 2011-11-02: 50 ug via INTRAVENOUS
  Administered 2011-11-02: 100 ug via INTRAVENOUS
  Administered 2011-11-02 (×2): 50 ug via INTRAVENOUS
  Administered 2011-11-02 (×2): 100 ug via INTRAVENOUS
  Administered 2011-11-02: 50 ug via INTRAVENOUS

## 2011-11-02 MED ORDER — KCL IN DEXTROSE-NACL 20-5-0.45 MEQ/L-%-% IV SOLN
INTRAVENOUS | Status: DC
  Administered 2011-11-02: 22:00:00 via INTRAVENOUS
  Administered 2011-11-03: 50 mL/h via INTRAVENOUS
  Administered 2011-11-04: 06:00:00 via INTRAVENOUS
  Filled 2011-11-02 (×5): qty 1000

## 2011-11-02 MED ORDER — KETOROLAC TROMETHAMINE 15 MG/ML IJ SOLN
15.0000 mg | Freq: Four times a day (QID) | INTRAMUSCULAR | Status: AC
  Administered 2011-11-03 – 2011-11-04 (×7): 15 mg via INTRAVENOUS
  Filled 2011-11-02 (×9): qty 1

## 2011-11-02 MED ORDER — FENTANYL 10 MCG/ML IV SOLN
INTRAVENOUS | Status: DC
  Administered 2011-11-02: 50 mL via INTRAVENOUS
  Administered 2011-11-03: 30 ug via INTRAVENOUS
  Administered 2011-11-03 (×2): 105 ug via INTRAVENOUS
  Administered 2011-11-03: 60 ug via INTRAVENOUS
  Administered 2011-11-03 – 2011-11-04 (×2): 45 ug via INTRAVENOUS
  Administered 2011-11-04: 15 ug via INTRAVENOUS
  Administered 2011-11-04: 19:00:00 via INTRAVENOUS
  Administered 2011-11-05: 10 ug via INTRAVENOUS
  Administered 2011-11-05 (×2): 30 ug via INTRAVENOUS
  Filled 2011-11-02 (×4): qty 50

## 2011-11-02 MED ORDER — DEXTROSE 5 % IV SOLN
1.5000 g | Freq: Two times a day (BID) | INTRAVENOUS | Status: AC
  Administered 2011-11-03 (×2): 1.5 g via INTRAVENOUS
  Filled 2011-11-02 (×2): qty 1.5

## 2011-11-02 MED ORDER — OXYCODONE-ACETAMINOPHEN 5-325 MG PO TABS: 1.0000 | ORAL_TABLET | ORAL | Status: DC | PRN

## 2011-11-02 MED ORDER — PROMETHAZINE HCL 25 MG/ML IJ SOLN: 6.2500 mg | INTRAMUSCULAR | Status: DC | PRN

## 2011-11-02 MED ORDER — 0.9 % SODIUM CHLORIDE (POUR BTL) OPTIME
TOPICAL | Status: DC | PRN
  Administered 2011-11-02: 1000 mL
  Administered 2011-11-02: 2000 mL

## 2011-11-02 MED ORDER — ACETAMINOPHEN 10 MG/ML IV SOLN
1000.0000 mg | Freq: Four times a day (QID) | INTRAVENOUS | Status: AC
  Administered 2011-11-02 – 2011-11-03 (×4): 1000 mg via INTRAVENOUS
  Filled 2011-11-02 (×6): qty 100

## 2011-11-02 MED ORDER — MIDAZOLAM HCL 5 MG/5ML IJ SOLN
INTRAMUSCULAR | Status: DC | PRN
  Administered 2011-11-02: 1 mg via INTRAVENOUS

## 2011-11-02 MED ORDER — PROPOFOL 10 MG/ML IV EMUL
INTRAVENOUS | Status: DC | PRN
  Administered 2011-11-02: 160 mg via INTRAVENOUS
  Administered 2011-11-02: 40 mg via INTRAVENOUS

## 2011-11-02 MED ORDER — DIPHENHYDRAMINE HCL 50 MG/ML IJ SOLN: 12.5000 mg | Freq: Four times a day (QID) | INTRAMUSCULAR | Status: DC | PRN

## 2011-11-02 MED ORDER — MIDAZOLAM HCL 2 MG/2ML IJ SOLN
INTRAMUSCULAR | Status: AC
  Filled 2011-11-02: qty 2

## 2011-11-02 MED ORDER — KETOROLAC TROMETHAMINE 30 MG/ML IJ SOLN
INTRAMUSCULAR | Status: AC
  Administered 2011-11-02: 15 mg via INTRAVENOUS
  Filled 2011-11-02: qty 1

## 2011-11-02 MED ORDER — DILTIAZEM HCL ER COATED BEADS 300 MG PO CP24: 300.0000 mg | ORAL_CAPSULE | Freq: Every day | ORAL | Status: DC

## 2011-11-02 MED ORDER — ONDANSETRON HCL 4 MG/2ML IJ SOLN
4.0000 mg | Freq: Four times a day (QID) | INTRAMUSCULAR | Status: DC | PRN
  Filled 2011-11-02: qty 2

## 2011-11-02 MED ORDER — ROCURONIUM BROMIDE 100 MG/10ML IV SOLN
INTRAVENOUS | Status: DC | PRN
  Administered 2011-11-02: 10 mg via INTRAVENOUS
  Administered 2011-11-02: 50 mg via INTRAVENOUS

## 2011-11-02 MED ORDER — BUPIVACAINE 0.5 % ON-Q PUMP SINGLE CATH 400 ML
400.0000 mL | INJECTION | Status: AC
  Administered 2011-11-02: 400 mL
  Filled 2011-11-02 (×2): qty 400

## 2011-11-02 MED ORDER — NALOXONE HCL 0.4 MG/ML IJ SOLN: 0.4000 mg | INTRAMUSCULAR | Status: DC | PRN

## 2011-11-02 MED ORDER — LACTATED RINGERS IV SOLN
INTRAVENOUS | Status: DC | PRN
  Administered 2011-11-02 (×4): via INTRAVENOUS

## 2011-11-02 MED ORDER — DILTIAZEM HCL ER COATED BEADS 300 MG PO CP24
300.0000 mg | ORAL_CAPSULE | Freq: Every evening | ORAL | Status: DC
  Administered 2011-11-02 – 2011-11-05 (×4): 300 mg via ORAL
  Filled 2011-11-02 (×6): qty 1

## 2011-11-02 MED ORDER — MIDAZOLAM HCL 2 MG/2ML IJ SOLN
1.0000 mg | INTRAMUSCULAR | Status: DC | PRN
  Administered 2011-11-02: 1 mg via INTRAVENOUS

## 2011-11-02 MED ORDER — ONDANSETRON HCL 4 MG/2ML IJ SOLN
4.0000 mg | Freq: Four times a day (QID) | INTRAMUSCULAR | Status: DC | PRN
  Administered 2011-11-03: 4 mg via INTRAVENOUS

## 2011-11-02 MED ORDER — ACETAMINOPHEN 10 MG/ML IV SOLN
INTRAVENOUS | Status: AC
  Administered 2011-11-03: 1000 mg via INTRAVENOUS
  Filled 2011-11-02: qty 100

## 2011-11-02 MED ORDER — ALBUTEROL SULFATE HFA 108 (90 BASE) MCG/ACT IN AERS
4.0000 | INHALATION_SPRAY | Freq: Four times a day (QID) | RESPIRATORY_TRACT | Status: DC
  Administered 2011-11-03: 4 via RESPIRATORY_TRACT
  Filled 2011-11-02: qty 6.7

## 2011-11-02 MED ORDER — EPHEDRINE SULFATE 50 MG/ML IJ SOLN
INTRAMUSCULAR | Status: DC | PRN
  Administered 2011-11-02 (×3): 10 mg via INTRAVENOUS

## 2011-11-02 MED ORDER — MEPERIDINE HCL 25 MG/ML IJ SOLN: 6.2500 mg | INTRAMUSCULAR | Status: DC | PRN

## 2011-11-02 MED ORDER — LISINOPRIL 40 MG PO TABS
40.0000 mg | ORAL_TABLET | Freq: Every day | ORAL | Status: DC
  Administered 2011-11-03 – 2011-11-05 (×3): 40 mg via ORAL
  Filled 2011-11-02 (×4): qty 1

## 2011-11-02 MED ORDER — LACTATED RINGERS IV SOLN: INTRAVENOUS | Status: DC

## 2011-11-02 MED ORDER — FENTANYL CITRATE 0.05 MG/ML IJ SOLN
50.0000 ug | INTRAMUSCULAR | Status: DC | PRN
  Administered 2011-11-02: 100 ug via INTRAVENOUS

## 2011-11-02 MED ORDER — OXYCODONE HCL 5 MG PO TABS: 5.0000 mg | ORAL_TABLET | ORAL | Status: AC | PRN

## 2011-11-02 MED ORDER — FENTANYL CITRATE 0.05 MG/ML IJ SOLN
INTRAMUSCULAR | Status: AC
  Filled 2011-11-02: qty 2

## 2011-11-02 MED ORDER — SENNOSIDES-DOCUSATE SODIUM 8.6-50 MG PO TABS
1.0000 | ORAL_TABLET | Freq: Every evening | ORAL | Status: DC | PRN
  Filled 2011-11-02: qty 1

## 2011-11-02 MED ORDER — SODIUM CHLORIDE 0.9 % IJ SOLN: 9.0000 mL | INTRAMUSCULAR | Status: DC | PRN

## 2011-11-02 MED ORDER — HYDROMORPHONE HCL PF 1 MG/ML IJ SOLN
0.2500 mg | INTRAMUSCULAR | Status: DC | PRN
Start: 2011-11-02 — End: 2011-11-02
  Administered 2011-11-02 (×4): 0.5 mg via INTRAVENOUS

## 2011-11-02 MED ORDER — POTASSIUM CHLORIDE 10 MEQ/50ML IV SOLN: 10.0000 meq | Freq: Every day | INTRAVENOUS | Status: DC | PRN

## 2011-11-02 SURGICAL SUPPLY — 74 items
BAG SPEC RTRVL LRG 6X4 10 (ENDOMECHANICALS) ×1
CANISTER SUCTION 2500CC (MISCELLANEOUS) ×4 IMPLANT
CATH KIT ON Q 5IN SLV (PAIN MANAGEMENT) ×1 IMPLANT
CATH THORACIC 28FR (CATHETERS) ×1 IMPLANT
CATH THORACIC 36FR (CATHETERS) IMPLANT
CATH THORACIC 36FR RT ANG (CATHETERS) ×1 IMPLANT
CLIP TI MEDIUM 6 (CLIP) ×3 IMPLANT
CLOTH BEACON ORANGE TIMEOUT ST (SAFETY) ×2 IMPLANT
CLSR STERI-STRIP ANTIMIC 1/2X4 (GAUZE/BANDAGES/DRESSINGS) ×1 IMPLANT
CONT SPEC 4OZ CLIKSEAL STRL BL (MISCELLANEOUS) ×14 IMPLANT
DRAIN CHANNEL 28F RND 3/8 FF (WOUND CARE) IMPLANT
DRAIN CHANNEL 32F RND 10.7 FF (WOUND CARE) IMPLANT
DRAPE LAPAROSCOPIC ABDOMINAL (DRAPES) ×2 IMPLANT
DRAPE SLUSH MACHINE 52X66 (DRAPES) IMPLANT
DRAPE SLUSH/WARMER DISC (DRAPES) IMPLANT
DRSG TEGADERM 4X4.75 (GAUZE/BANDAGES/DRESSINGS) ×1 IMPLANT
ELECT BLADE 6.5 EXT (BLADE) ×1 IMPLANT
ELECT REM PT RETURN 9FT ADLT (ELECTROSURGICAL) ×2
ELECTRODE REM PT RTRN 9FT ADLT (ELECTROSURGICAL) ×1 IMPLANT
GLOVE EUDERMIC 7 POWDERFREE (GLOVE) ×4 IMPLANT
GOWN PREVENTION PLUS XLARGE (GOWN DISPOSABLE) ×4 IMPLANT
GOWN STRL NON-REIN LRG LVL3 (GOWN DISPOSABLE) ×4 IMPLANT
HANDLE STAPLE ENDO GIA SHORT (STAPLE) ×1
KIT BASIN OR (CUSTOM PROCEDURE TRAY) ×2 IMPLANT
KIT ROOM TURNOVER OR (KITS) ×2 IMPLANT
KIT SUCTION CATH 14FR (SUCTIONS) ×2 IMPLANT
NS IRRIG 1000ML POUR BTL (IV SOLUTION) ×4 IMPLANT
PACK CHEST (CUSTOM PROCEDURE TRAY) ×2 IMPLANT
PAD ARMBOARD 7.5X6 YLW CONV (MISCELLANEOUS) ×4 IMPLANT
POUCH ENDO CATCH II 15MM (MISCELLANEOUS) ×1 IMPLANT
POUCH SPECIMEN RETRIEVAL 10MM (ENDOMECHANICALS) ×1 IMPLANT
RELOAD EGIA 45 MED/THCK PURPLE (STAPLE) ×4 IMPLANT
RELOAD EGIA 45 TAN VASC (STAPLE) ×5 IMPLANT
RELOAD EGIA 60 MED/THCK PURPLE (STAPLE) ×14 IMPLANT
RELOAD EGIA TRIS TAN 45 CVD (STAPLE) ×2 IMPLANT
RELOAD STAPLE 45 TAN MED CVD (STAPLE) IMPLANT
RELOAD STAPLE 60 MED/THCK ART (STAPLE) IMPLANT
SEALANT PROGEL (MISCELLANEOUS) ×1 IMPLANT
SEALANT SURG COSEAL 4ML (VASCULAR PRODUCTS) IMPLANT
SEALANT SURG COSEAL 8ML (VASCULAR PRODUCTS) IMPLANT
SOLUTION ANTI FOG 6CC (MISCELLANEOUS) ×3 IMPLANT
SPECIMEN JAR MEDIUM (MISCELLANEOUS) ×2 IMPLANT
SPONGE GAUZE 4X4 12PLY (GAUZE/BANDAGES/DRESSINGS) ×3 IMPLANT
SPONGE INTESTINAL PEANUT (DISPOSABLE) ×2 IMPLANT
STAPLER ENDO GIA 12 SHRT THIN (STAPLE) IMPLANT
STAPLER ENDO GIA 12MM SHORT (STAPLE) ×1 IMPLANT
SUT PROLENE 4 0 RB 1 (SUTURE) ×2
SUT PROLENE 4-0 RB1 .5 CRCL 36 (SUTURE) IMPLANT
SUT SILK  1 MH (SUTURE) ×2
SUT SILK 1 MH (SUTURE) ×1 IMPLANT
SUT SILK 2 0SH CR/8 30 (SUTURE) ×1 IMPLANT
SUT SILK 3 0SH CR/8 30 (SUTURE) IMPLANT
SUT VIC AB 0 CTX 27 (SUTURE) IMPLANT
SUT VIC AB 1 CTX 27 (SUTURE) ×2 IMPLANT
SUT VIC AB 2-0 CT1 27 (SUTURE)
SUT VIC AB 2-0 CT1 TAPERPNT 27 (SUTURE) IMPLANT
SUT VIC AB 2-0 CTX 36 (SUTURE) ×1 IMPLANT
SUT VIC AB 3-0 MH 27 (SUTURE) IMPLANT
SUT VIC AB 3-0 SH 27 (SUTURE)
SUT VIC AB 3-0 SH 27X BRD (SUTURE) IMPLANT
SUT VIC AB 3-0 X1 27 (SUTURE) ×3 IMPLANT
SUT VICRYL 0 UR6 27IN ABS (SUTURE) ×3 IMPLANT
SUT VICRYL 2 TP 1 (SUTURE) ×1 IMPLANT
SWAB COLLECTION DEVICE MRSA (MISCELLANEOUS) IMPLANT
SYSTEM SAHARA CHEST DRAIN ATS (WOUND CARE) ×2 IMPLANT
TAPE CLOTH SURG 4X10 WHT LF (GAUZE/BANDAGES/DRESSINGS) ×2 IMPLANT
TIP APPLICATOR SPRAY EXTEND 16 (VASCULAR PRODUCTS) ×1 IMPLANT
TOWEL OR 17X24 6PK STRL BLUE (TOWEL DISPOSABLE) ×2 IMPLANT
TOWEL OR 17X26 10 PK STRL BLUE (TOWEL DISPOSABLE) ×4 IMPLANT
TRAP SPECIMEN MUCOUS 40CC (MISCELLANEOUS) IMPLANT
TRAY FOLEY CATH 14FR (SET/KITS/TRAYS/PACK) ×2 IMPLANT
TUBE ANAEROBIC SPECIMEN COL (MISCELLANEOUS) IMPLANT
TUNNELER SHEATH ON-Q 11GX8 (MISCELLANEOUS) ×2 IMPLANT
WATER STERILE IRR 1000ML POUR (IV SOLUTION) ×4 IMPLANT

## 2011-11-02 NOTE — Anesthesia Postprocedure Evaluation (Signed)
Anesthesia Post Note  Patient: Kyle Long  Procedure(s) Performed: Procedure(s) (LRB): VIDEO ASSISTED THORACOSCOPY (VATS)/WEDGE RESECTION (Left) LOBECTOMY (Left)  Anesthesia type: general  Patient location: PACU  Post pain: Pain level controlled  Post assessment: Patient's Cardiovascular Status Stable  Last Vitals:  Filed Vitals:   11/02/11 1847  BP: 150/87  Pulse: 78  Temp:   Resp: 23    Post vital signs: Reviewed and stable  Level of consciousness: sedated  Complications: No apparent anesthesia complications . CVL transduces a central venous waveform with a mean of 

## 2011-11-02 NOTE — OR Nursing (Signed)
Progel 18mm-Pleural Air Leak Sealant was used in surgical procedure Exp. Date 03/30/2013/AMasonRN

## 2011-11-02 NOTE — Transfer of Care (Signed)
Immediate Anesthesia Transfer of Care Note  Patient: Kyle Long  Procedure(s) Performed: Procedure(s) (LRB): VIDEO ASSISTED THORACOSCOPY (VATS)/WEDGE RESECTION (Left) LOBECTOMY (Left)  Patient Location: PACU  Anesthesia Type: General  Level of Consciousness: awake  Airway & Oxygen Therapy: Patient Spontanous Breathing and Patient connected to nasal cannula oxygen  Post-op Assessment: Report given to PACU RN and Post -op Vital signs reviewed and stable  Post vital signs: Reviewed and stable  Complications: No apparent anesthesia complications

## 2011-11-02 NOTE — Brief Op Note (Addendum)
11/02/2011  5:24 PM  PATIENT:  Kyle Long  66 y.o. male  PRE-OPERATIVE DIAGNOSIS:  Left  LOWER LOBE NODULE  POST-OPERATIVE DIAGNOSIS:  ADENOCARCINOMA Left  LOWER LOBE   PROCEDURE:  Procedure(s) (LRB): VIDEO ASSISTED THORACOSCOPY (VATS)/WEDGE RESECTION (Left) LOWER LOBECTOMY (Left) Placement of On- Q local anesthetic catheter  SURGEON:  Surgeon(s) and Role:    * Loreli Slot, MD - Primary  PHYSICIAN ASSISTANT: Coral Ceo, PA-C  ANESTHESIA:   general  EBL:  Total I/O In: 3650 [I.V.:3650] Out: 785 [Urine:385; Blood:400]  BLOOD ADMINISTERED:none  DRAINS: 2 Chest Tube(s) in the left pleura   LOCAL MEDICATIONS USED:  MARCAINE     SPECIMEN:  Source of Specimen:  LLL wedge, LLL, multiple LN  DISPOSITION OF SPECIMEN:  PATHOLOGY  COUNTS:  YES  PLAN OF CARE: Admit to inpatient   PATIENT DISPOSITION:  PACU - hemodynamically stable.   Delay start of Pharmacological VTE agent (>24hrs) due to surgical blood loss or risk of bleeding: no   Findings- 2 cm mass LLL- frozen + adenocarcinoma, margins -

## 2011-11-02 NOTE — Interval H&P Note (Signed)
History and Physical Interval Note:  11/02/2011 12:32 PM  [name]  has presented today for surgery, with the diagnosis of Left  LOWER LOBE NODULE  The various methods of treatment have been discussed with the patient and family. After consideration of risks, benefits and other options for treatment, the patient has consented to  Procedure(s) (LRB): VIDEO ASSISTED THORACOSCOPY (VATS)/WEDGE RESECTION (Left) as a surgical intervention .  The patients' history has been reviewed, patient examined, no change in status, stable for surgery.  I have reviewed the patients' chart and labs.  Questions were answered to the patient's satisfaction.     Kyle Long

## 2011-11-02 NOTE — Op Note (Signed)
NAMEMarland Kitchen  ASANTE, BLANDA NO.:  [id number]  MEDICAL RECORD NO.:  [id number]  LOCATION:  3304                         FACILITY:  [facility]  PHYSICIAN:  Salvatore Decent. Dorris Fetch, M.D.DATE OF BIRTH:  1945-10-04  DATE OF PROCEDURE: DATE OF DISCHARGE:                              OPERATIVE REPORT   PREOPERATIVE DIAGNOSIS:  Left lower lobe mass.  POSTOPERATIVE DIAGNOSIS:  Adenocarcinoma, left lower lobe.  PROCEDURE:  Left video-assisted thoracoscopy, wedge resection of left lower lobe nodule, thoracoscopic left lower lobectomy, mediastinal lymph node dissection, placement of On-Q local anesthetic catheter.  SURGEON:  Salvatore Decent. Dorris Fetch, MD  ASSISTANT:  Coral Ceo, PA  ANESTHESIA:  General.  FINDINGS:  2-cm mass, lateral aspect of the left lower lobe.  Frozen section revealed adenocarcinoma.  Margins clear.  CLINICAL NOTE:  Mr. Obara is a 66 year old gentleman, who is a former smoker who developed a cough in December of last year.  He was treated for bronchitis; however, his cough persisted.  A chest x-ray showed left lower lobe nodule.  This was confirmed by CT.  PET scan showed mild increased uptake.  A needle biopsy was done.  Pathology showed atypical cells suspicious for adenocarcinoma, but not definitively diagnostic.  The patient was advised to undergo left VATS and wedge resection with lobectomy for definitive treatment if the lesion was in fact cancer.  The indications, risks, benefits, and alternatives were discussed in detail with the patient.  He understood and accepted the risks and agreed to proceed.  OPERATIVE NOTE:  Mr. Landers was brought to the preoperative holding area on November 02, 2011.  There, Anesthesia placed a central line for intravenous access and arterial blood pressure monitoring line.  PAS hose were placed for DVT prophylaxis.  Intravenous antibiotics were administered.  He was taken to the operating room, anesthetized,  and intubated with a double-lumen endotracheal tube.  He was placed in a right lateral decubitus position, and the left chest was prepped and draped in usual sterile fashion.  Single lung ventilation of the right lung was carried out and this was tolerated well throughout the procedure.  An incision was made in the midaxillary line at approximately the 7th intercostal space.  It was carried through the skin and subcutaneous tissue.  The chest was entered bluntly using a hemostat.  A port was inserted and the thoracoscope was placed in the chest.  There was no cross ventilation, but the lung remained inflated.  Suction was applied down the left mainstem bronchus, and there was good deflation of the lung and good separation thereafter.  A small port incision was made posteriorly for retraction and a small utility incision was made anteriorly.  This was approximately 6 cm in length.  No rib spreading was done during the procedure.  Inspection of the chest revealed the fissure was incomplete.  The mass was noted in the posterior aspect of the left lower lobe.  It was subpleural, but easily felt through the pleural surface.  A wedge resection was performed of the lesion with sequential firings of an Endo-GIA type stapler.  Specimen was placed into an endoscopic retrieval bag and removed through the anterior incision.  It was sent  for frozen section, which returned positive for adenocarcinoma.  Decision made was to proceed with the lobectomy for definitive treatment.  The left lower lobe was retracted superiorly.  The inferior pulmonary ligament was divided.  The paraesophageal level 8 node was identified as well as level 9 inferior ligament node.  These were sent as separate specimens.  Next, the pleural reflection was divided the hilum initially anteriorly and then posteriorly.  The inferior pulmonary vein was dissected out and divided with an endoscopic vascular stapler.   Next, dissection was carried to the hilum anteriorly and there was a large node at the bifurcation of the upper and lower lobe bronchus.  This was relatively avascular and relatively adherent to the pulmonary artery as well.  Dissection was quite tedious and time-consuming.  Decision was made to begin completion of the fissure.  This was done with sequential firings of an Endo-GIA stapler to expose the basilar segmental branches. Additional nodes were taken during this dissection.  Once the basilar segmental branches of the pulmonary artery had been freed from the surrounding attachments and encircled, they were divided with an endoscopic vascular stapler.  More superiorly and laterally in the fissure, an attempt was made to dissect out the superior segmental branch.  Bleeding was encountered at this site.  The dissection was carried back to the hilum.  The left lower lobe bronchus was identified. Nodes were dissected off.  Additional nodes were brought up into the specimen.  The basilar segmental bronchi were encircled and divided with an endoscopic stapler.  The superior segmental bronchus came off had a very oblique angle and was obscured by the pulmonary artery.  This was dissected out separately and divided with an endoscopic stapler.  At this point, the lobectomy then was completed with sequential firings of an Endo-GIA stapler.  The specimen was placed into an endoscopic retrieval bag and retrieved through the anterior incision.  There was bleeding from our bronchial artery, which was controlled with clips and electrocautery.  Chest was copiously irrigated with saline.  Test inflation showed no major air leak and no leak at all from the bronchial stumps.  The lung was deflated.  Lymph nodes then were removed. Multiple 11 nodes were removed during the dissections and sent as separate specimens.  Level 10 nodes then were dissected out, and again sent separately as well as level 7  lymph nodes.  Final inspection for hemostasis was made.  An On-Q local anesthetic catheter was placed through a separate stab incision posteriorly.  We were unable to feed this into a subpleural location.  Therefore, the catheter was placed adjacent to the ribs, external to the chest and secured with a 3-0 silk suture.  ProGEL was applied to the extensive staple line along the fissure.  A 36-French right-angle tube was placed through the original port incision directed posteriorly and superiorly.  A second incision was made for the anterior chest tube, which was directed anteriorly and superiorly.  The first layer of the closure of the utility thoracotomy was performed with a running #1 Vicryl suture to reapproximate the serratus muscle fibers.  The scope was then placed through the posterior port incision and the left upper lobe was reinflated.  There was good reinflation of the lobe.  The scope was removed.  The posterior port incision was closed with a #1 Vicryl fascial suture and a 3-0 Vicryl subcuticular suture.  The subcutaneous tissue of the utility incision was closed with a 2-0 Vicryl running suture, and  the skin was closed with a 3-0 Vicryl subcuticular suture.  All sponge, needle, and instrument counts were correct at the end of the procedure.  The patient was placed back into a supine position, was extubated in the operating room, and taken to the postanesthetic care unit in stable condition.     Salvatore Decent Dorris Fetch, M.D.     SCH/MEDQ  D:  11/02/2011  T:  11/02/2011  Job:  [id number]

## 2011-11-02 NOTE — Anesthesia Postprocedure Evaluation (Signed)
Called by PACU regarding post-op CXR. Concerns voiced regarding CVL placement. Line infusions d/c'd. Line aspirates with non-pulsitile venous appearing blood. CXR reviewed with Dr. Maren Beach who feels it can be left in place pending consult with Dr. Dorris Fetch. Probable left sided SVC.  CFrederick

## 2011-11-02 NOTE — Anesthesia Preprocedure Evaluation (Addendum)
Anesthesia Evaluation    Reviewed: Allergy & Precautions, H&P , Patient's Chart, lab work & pertinent test results  Airway Mallampati: III      Dental   Pulmonary          Cardiovascular hypertension, Pt. on medications     Neuro/Psych    GI/Hepatic PUD, GERD-  ,  Endo/Other    Renal/GU      Musculoskeletal   Abdominal   Peds  Hematology   Anesthesia Other Findings   Reproductive/Obstetrics                           Anesthesia Physical Anesthesia Plan  ASA: III  Anesthesia Plan: General   Post-op Pain Management:    Induction: Intravenous  Airway Management Planned: Double Lumen EBT  Additional Equipment: Arterial line and CVP  Intra-op Plan:   Post-operative Plan: Extubation in OR  Informed Consent:   Plan Discussed with:   Anesthesia Plan Comments:         Anesthesia Quick Evaluation

## 2011-11-02 NOTE — Preoperative (Signed)
Beta Blockers   Reason not to administer Beta Blockers:Not Applicable 

## 2011-11-02 NOTE — H&P (View-Only) (Signed)
PCP is Marga Melnick, MD, MD Referring Provider is Sandford Craze, NP  Chief Complaint  Patient presents with  . Lung Mass    eval and treat...CT CHEST [facility], PET    HPI: 66 year old former smoker presents with a chief complaint of a left lung nodule. Mr. Livolsi was in his usual state of good health until December of last year when he developed a cough. He was treated for presumed bronchitis. However his cough persisted. He then will see him back again in March. At that time a chest x-ray showed a possible left lower lobe nodule. A CT scan was done which showed a left lower lobe nodule which was suspicious for bronchogenic carcinoma. A PET CT showed mild increased uptake in the left lower lobe nodule. A needle biopsy was done, pathology from that showed atypical cells suspicious for adenocarcinoma.  He complains of cough, which typically nonproductive. Denies hemoptysis, wheezing, shortness of breath, chest pain, chest tightness or pressure. He has migrating arthritis but no unusual bone or joint pain. He has no new neurologic symptoms.   Past Medical History  Diagnosis Date  . Hypertension   . Peyronie disease   . Gilbert's syndrome   . Hyperlipidemia   . Amnesia     isolated;for 30 mins w/elevated BP  . Hematuria 2009    Dr Merry Lofty  . Hyperglycemia   . Diverticulosis of colon   . Cancer     skin  . Cellulitis 2011    LUE  . Peptic ulcer 1995    Past Surgical History  Procedure Date  . Colonoscopy w/ polypectomy 2005    due 2015  . Vasectomy   . Cystoscopy 2009  . Vein surgery     R ankle    Family History  Problem Relation Age of Onset  . Stroke Mother   . Hypertension Mother   . Cancer Mother     breast  . Cancer Father     melanoma  . Coronary artery disease Father     MI @ 10  . Hypertension Sister   . Thyroid disease Sister     ??    Social History History  Substance Use Topics  . Smoking status: Former Smoker    Quit  date: 07/31/1979  . Smokeless tobacco: Not on file  . Alcohol Use: 8.4 oz/week    14 Shots of liquor per week    Current Outpatient Prescriptions  Medication Sig Dispense Refill  . diltiazem (TIAZAC) 240 MG 24 hr capsule Take 1 capsule (240 mg total) by mouth daily.  90 capsule  3  . lisinopril (PRINIVIL,ZESTRIL) 40 MG tablet Take 1 tablet (40 mg total) by mouth daily.  90 tablet  3    No Known Allergies  Review of Systems  Constitutional: Negative for fever, chills, activity change, appetite change, fatigue and unexpected weight change.  HENT: Negative.   Respiratory: Positive for cough. Negative for chest tightness and shortness of breath.   Cardiovascular: Negative for chest pain, palpitations and leg swelling.  Gastrointestinal:       Trouble swallowing- rarely occurs  Genitourinary: Negative.   Musculoskeletal: Positive for arthralgias.  Neurological: Negative.   Hematological: Does not bruise/bleed easily.  All other systems reviewed and are negative.    BP 191/112  Pulse 92  Resp 16  Ht 5' 10.5" (1.791 m)  Wt 220 lb (99.791 kg)  BMI 31.12 kg/m2  SpO2 99% Physical Exam  Constitutional: He is oriented to person, place,  and time. He appears well-developed and well-nourished. No distress.  HENT:  Head: Normocephalic and atraumatic.  Eyes: EOM are normal. Pupils are equal, round, and reactive to light.  Neck: Neck supple. No JVD present. No tracheal deviation present.  Cardiovascular: Normal rate, regular rhythm and intact distal pulses.  Exam reveals no gallop and no friction rub.   No murmur heard. Pulmonary/Chest: Effort normal and breath sounds normal. He has no wheezes. He has no rales.  Abdominal: Soft. There is no tenderness.  Musculoskeletal: He exhibits no edema.  Lymphadenopathy:    He has no cervical adenopathy.  Neurological: He is alert and oriented to person, place, and time. No cranial nerve deficit.  Skin: Skin is warm and dry.     Diagnostic  Tests: CT IMPRESSION:  1. Microlobulated nodule in the left lower lobe accounts for the  questioned abnormality on recent chest radiograph and is worrisome  for primary bronchogenic carcinoma. Consultation with the [facility] [phone]) is  recommended. These results will be called to the ordering clinician  or representative by the Radiologist Assistant, and communication  documented in the PACS Dashboard.  2. 5 mm right upper lobe nodule, nonspecific.  3. Borderline enlarged high right paratracheal lymph node.  PET/CT IMPRESSION:  1. Borderline malignant range hypermetabolism within the left  lower lobe nodule. Together with the CT morphology, primary  bronchogenic carcinoma is favored. If this is a non-small cell  lung cancer, it is most consistent with T1aN0M0 or a stage IA  lesion.  2. Air fluid levels and scattered opacification within the  paranasal sinuses.   Impression: 66 year old former smoker with a 1.7 cm left lower lobe mass. This is hypermetabolic by PET. Biopsy showed atypical cells suspicious for carcinoma. This in all likelihood is in fact a bronchogenic carcinoma. It does appear to be a stage I lesion clinically.  I reviewed the films with Mr. and Mrs. Boeve. We discussed the likelihood that this is cancer and treatment thereof. I recommended to them that we proceed with surgical resection. We would do a left VATS, wedge resection to confirm the diagnosis by frozen section, followed by a left lower lobectomy with the diagnosis confirmed.  I discussed with them the general nature of the operation, need for general anesthesia, incisions to be used, expected hospital stay, and overall recovery. I discussed in detail with them the indications, risks, benefits, and alternatives. They understand the risk include, but are not limited to, death, stroke, MI, DVT, PE, infection, bleeding, possible need for transfusion, prolonged air  leak, cardiac arrhythmias, as well as other unforeseeable complications. He agrees to proceed.   Plan: Plan for left VATS, wedge resection, lower lobectomy on Friday, April 5.

## 2011-11-03 ENCOUNTER — Inpatient Hospital Stay (HOSPITAL_COMMUNITY): Payer: Medicare HMO

## 2011-11-03 LAB — BLOOD GAS, ARTERIAL
Drawn by: 31276
O2 Content: 1 L/min
Patient temperature: 98.6
TCO2: 25.5 mmol/L (ref 0–100)
pCO2 arterial: 40.6 mmHg (ref 35.0–45.0)
pH, Arterial: 7.394 (ref 7.350–7.450)

## 2011-11-03 LAB — CBC
MCV: 93.1 fL (ref 78.0–100.0)
Platelets: 109 10*3/uL — ABNORMAL LOW (ref 150–400)
RBC: 3.48 MIL/uL — ABNORMAL LOW (ref 4.22–5.81)
RDW: 13.3 % (ref 11.5–15.5)
WBC: 10 10*3/uL (ref 4.0–10.5)

## 2011-11-03 LAB — BASIC METABOLIC PANEL
Calcium: 8 mg/dL — ABNORMAL LOW (ref 8.4–10.5)
GFR calc Af Amer: >90 mL/min (ref >90)
GFR calc non Af Amer: 90 mL/min — ABNORMAL LOW (ref >90)
Glucose, Bld: 163 mg/dL — ABNORMAL HIGH (ref 70–99)
Potassium: 3.8 mEq/L (ref 3.5–5.1)
Sodium: 136 mEq/L (ref 135–145)

## 2011-11-03 MED ORDER — ALBUTEROL SULFATE HFA 108 (90 BASE) MCG/ACT IN AERS: 2.0000 | INHALATION_SPRAY | Freq: Four times a day (QID) | RESPIRATORY_TRACT | Status: DC | PRN

## 2011-11-03 NOTE — Progress Notes (Addendum)
[address]            [place] [address]          [phone]     1 Day Post-Op Procedure(s) (LRB): VIDEO ASSISTED THORACOSCOPY (VATS)/WEDGE RESECTION (Left) LOBECTOMY (Left)  Subjective: Just back from walking. Feels well.  Breathing stable, no complaints.  Objective: Vital signs in last 24 hours: Patient Vitals for the past 24 hrs:  BP Temp Temp src Pulse Resp SpO2 Height Weight  11/03/11 0800 - 98 F (36.7 C) Oral - - - - -  11/03/11 0400 138/70 mmHg 97.9 F (36.6 C) Oral 67  13  100 % - -  11/03/11 0244 - - - - - 99 % - -  11/03/11 0019 156/81 mmHg 97.7 F (36.5 C) Oral 73  16  98 % - -  11/03/11 0000 - - - - 19  99 % - -  11/02/11 2154 154/90 mmHg - - - - - - -  11/02/11 2030 154/90 mmHg 97.8 F (36.6 C) Oral 80  16  100 % 5\' 11"  (1.803 m) 102.422 kg (225 lb 12.8 oz)  11/02/11 2000 - - - 82  17  100 % - -  11/02/11 1945 138/81 mmHg - - 88  18  100 % - -  11/02/11 1930 147/82 mmHg - - 89  21  100 % - -  11/02/11 1915 159/84 mmHg - - 93  16  100 % - -  11/02/11 1900 149/84 mmHg - - 94  19  100 % - -  11/02/11 1847 150/87 mmHg - - 78  23  100 % - -  11/02/11 1845 - - - 82  16  100 % - -  11/02/11 1832 145/87 mmHg - - 89  23  100 % - -  11/02/11 1830 - - - 92  19  100 % - -  11/02/11 1822 - - - - 16  100 % - -  11/02/11 1815 146/82 mmHg - - 89  17  100 % - -  11/02/11 1800 153/91 mmHg - - 90  19  98 % - -  11/02/11 1745 152/84 mmHg - - 87  21  99 % - -  11/02/11 1737 - - - 87  18  100 % - -  11/02/11 1730 153/81 mmHg 97.8 F (36.6 C) - 85  19  98 % - -  11/02/11 1330 - 97.8 F (36.6 C) - - - - - -  11/02/11 1240 - - - 69  14  100 % - -  11/02/11 1206 - - - 65  11  100 % - -   Current Weight  11/02/11 102.422 kg (225 lb 12.8 oz)     Intake/Output from previous day: 04/05 0701 - 04/06 0700 In: 5190 [P.O.:480; I.V.:4360; IV Piggyback:350] Out: 1950 [Urine:1160; Blood:400; Chest Tube:390]    PHYSICAL EXAM:  Heart:  RRR Lungs: slightly decreased BS on L Wound: dressed and dry except small amount bloody drainage from On-Q site Chest tube: small 1/7 intermittent air leak off suction  Lab Results: CBC: Basename 11/03/11 0409 10/31/11 1524  WBC 10.0 8.8  HGB 11.0* 14.6  HCT 32.4* 41.5  PLT 109* 135*   BMET:  Basename 11/03/11 0409 10/31/11 1524  NA 136 138  K 3.8 4.2  CL 103 101  CO2 24  29  GLUCOSE 163* 98  BUN 11 13  CREATININE 0.81 0.89  CALCIUM 8.0* 9.6    PT/INR:  Basename 10/31/11 1524  LABPROT 13.0  INR 0.96   CXR: No pneumothorax. Positioning of the left-sided central line  suggests tip in brachiocephalic or mammary vein.   Assessment/Plan: S/P Procedure(s) (LRB): VIDEO ASSISTED THORACOSCOPY (VATS)/WEDGE RESECTION (Left) LOBECTOMY (Left) Doing well POD#1. CT examined off suction after walk.  Small air leak.  Will leave on suction until seen by MD. D/c a-line, decrease IVF, continue pulm toilet, ambulation. HTN- BPs up this am.  Home meds resumed.   LOS: 1 day    COLLINS,GINA H 11/03/2011  Patient seen and examined. Agree with above He looks great. Has already walked twice this AM Will keep CT on suction today, may be able to d/c one tube tomorrow

## 2011-11-04 ENCOUNTER — Inpatient Hospital Stay (HOSPITAL_COMMUNITY): Payer: Medicare HMO

## 2011-11-04 LAB — CBC
MCH: 32.3 pg (ref 26.0–34.0)
MCHC: 33.8 g/dL (ref 30.0–36.0)
Platelets: 105 10*3/uL — ABNORMAL LOW (ref 150–400)
RBC: 3.28 MIL/uL — ABNORMAL LOW (ref 4.22–5.81)

## 2011-11-04 LAB — COMPREHENSIVE METABOLIC PANEL
ALT: 24 U/L (ref 0–53)
AST: 18 U/L (ref 0–37)
Alkaline Phosphatase: 40 U/L (ref 39–117)
CO2: 27 mEq/L (ref 19–32)
Calcium: 8.4 mg/dL (ref 8.4–10.5)
Potassium: 3.8 mEq/L (ref 3.5–5.1)
Sodium: 137 mEq/L (ref 135–145)
Total Protein: 5.5 g/dL — ABNORMAL LOW (ref 6.0–8.3)

## 2011-11-04 MED ORDER — SODIUM CHLORIDE 0.9 % IJ SOLN
INTRAMUSCULAR | Status: AC
  Administered 2011-11-04: 10 mL
  Filled 2011-11-04: qty 10

## 2011-11-04 MED ORDER — MUPIROCIN 2 % EX OINT
TOPICAL_OINTMENT | Freq: Two times a day (BID) | CUTANEOUS | Status: DC
  Administered 2011-11-04 – 2011-11-05 (×4): via NASAL
  Filled 2011-11-04: qty 22

## 2011-11-04 MED ORDER — KCL IN DEXTROSE-NACL 20-5-0.45 MEQ/L-%-% IV SOLN
INTRAVENOUS | Status: DC
  Administered 2011-11-04: 10 mL/h via INTRAVENOUS
  Filled 2011-11-04: qty 1000

## 2011-11-04 NOTE — Progress Notes (Signed)
Posterior CT d/c per MD order, pt tol well, pt educated to call if SOB, CP or Increase in HR

## 2011-11-04 NOTE — Progress Notes (Addendum)
                    [address]            [place] [address]          [phone]     2 Days Post-Op Procedure(s) (LRB): VIDEO ASSISTED THORACOSCOPY (VATS)/WEDGE RESECTION (Left) LOBECTOMY (Left)  Subjective: Walked twice already this am.  Feels well, no complaints.  Breathing stable.  Objective: Vital signs in last 24 hours: Patient Vitals for the past 24 hrs:  BP Temp Temp src Pulse Resp SpO2  11/04/11 0300 106/64 mmHg 98.6 F (37 C) Oral 66  15  98 %  11/04/11 0000 - - - - 15  94 %  11/03/11 2300 128/75 mmHg 98.2 F (36.8 C) Oral 74  15  94 %  11/03/11 1953 - - - - 20  96 %  11/03/11 1900 143/66 mmHg 98.5 F (36.9 C) Oral 74  18  97 %  11/03/11 1551 - - - - 20  97 %  11/03/11 1513 124/62 mmHg 98.5 F (36.9 C) Oral 64  16  96 %  11/03/11 1226 132/71 mmHg 98.4 F (36.9 C) Oral 65  20  97 %  11/03/11 1204 - - - - 19  98 %  11/03/11 0800 105/55 mmHg 98 F (36.7 C) Oral 66  19  97 %   Current Weight  11/02/11 102.422 kg (225 lb 12.8 oz)     Intake/Output from previous day: 04/06 0701 - 04/07 0700 In: 920 [P.O.:720; I.V.:200] Out: 2390 [Urine:2300; Chest Tube:90]    PHYSICAL EXAM:  Heart: RRR Lungs:clear Wound: clean and dry Chest tube: no air leak noted  Lab Results: CBC: Basename 11/04/11 0510 11/03/11 0409  WBC 10.1 10.0  HGB 10.6* 11.0*  HCT 31.4* 32.4*  PLT 105* 109*   BMET:  Basename 11/04/11 0510 11/03/11 0409  NA 137 136  K 3.8 3.8  CL 103 103  CO2 27 24  GLUCOSE 123* 163*  BUN 12 11  CREATININE 0.93 0.81  CALCIUM 8.4 8.0*    PT/INR: No results found for this basename: LABPROT,INR in the last 72 hours  CXR: not done yet  Assessment/Plan: S/P Procedure(s) (LRB): VIDEO ASSISTED THORACOSCOPY (VATS)/WEDGE RESECTION (Left) LOBECTOMY (Left) No air leak, CXR pending.  Will hopefully be able to remove 1 CT today and possibly place the remaining tube to H2O seal. Continue ambulation, pulm toilet. BP better now that home  meds resumed.   LOS: 2 days    COLLINS,GINA H 11/04/2011   patient seen and examined. Agree with above Will d/c posterior CT today

## 2011-11-05 ENCOUNTER — Inpatient Hospital Stay (HOSPITAL_COMMUNITY): Payer: Medicare HMO

## 2011-11-05 ENCOUNTER — Encounter (HOSPITAL_COMMUNITY): Payer: Self-pay | Admitting: Thoracic Surgery (Cardiothoracic Vascular Surgery)

## 2011-11-05 DIAGNOSIS — Z9889 Other specified postprocedural states: Secondary | ICD-10-CM

## 2011-11-05 MED ORDER — OXYCODONE-ACETAMINOPHEN 5-325 MG PO TABS
1.0000 | ORAL_TABLET | ORAL | Status: AC | PRN
Start: 2011-11-05 — End: 2011-11-15

## 2011-11-05 MED ORDER — ALBUTEROL SULFATE HFA 108 (90 BASE) MCG/ACT IN AERS: 2.0000 | INHALATION_SPRAY | Freq: Four times a day (QID) | RESPIRATORY_TRACT | Status: DC | PRN

## 2011-11-05 MED ORDER — TRAMADOL HCL 50 MG PO TABS: 50.0000 mg | ORAL_TABLET | Freq: Four times a day (QID) | ORAL | Status: AC | PRN

## 2011-11-05 MED ORDER — SODIUM CHLORIDE 0.9 % IJ SOLN
INTRAMUSCULAR | Status: AC
  Administered 2011-11-05: 22:00:00
  Filled 2011-11-05: qty 20

## 2011-11-05 NOTE — Discharge Summary (Signed)
Physician Discharge Summary  Patient ID: Kyle Long MRN: [id number] DOB/AGE: May 11, 1946 66 y.o.  Admit date: 11/02/2011 Discharge date: 11/05/2011  Admission Diagnoses:  Patient Active Problem List  Diagnoses  . HYPERLIPIDEMIA  . GILBERT'S SYNDROME  . HYPERTENSION  . DIVERTICULOSIS, COLON  . Gross hematuria  . BENIGN PROSTATIC HYPERTROPHY  . PEYRONIE'S DISEASE  . ARTHRALGIA  . MEMORY LOSS  . Other Abnormal Glucose  . SKIN CANCER, HX OF  . COLONIC POLYPS, HX OF  . Bronchitis  . S/P thoracotomy   Discharge Diagnoses:   Patient Active Problem List  Diagnoses  . HYPERLIPIDEMIA  . GILBERT'S SYNDROME  . HYPERTENSION  . DIVERTICULOSIS, COLON  . Gross hematuria  . BENIGN PROSTATIC HYPERTROPHY  . PEYRONIE'S DISEASE  . ARTHRALGIA  . MEMORY LOSS  . Other Abnormal Glucose  . SKIN CANCER, HX OF  . COLONIC POLYPS, HX OF  . Bronchitis  . S/P thoracotomy   Discharged Condition: good  History of Present Illness:  Kyle Long is a 66 yo male who was referred to Dr. Dorris Fetch for a left lung nodule.  The patient had not had significant health issues until last December he developed persistent cough.  He was given antibiotics for suspected bronchitis, however his cough persisted.  The patient underwent a CXR which revealed a possible left lower lobe nodule.  Follow up CT scan was performed which confirmed a left lower lobe nodule which was felt to be suspicious for bronchogenic carcinoma measuring 1.7cm.  Finally the patient went for a PET CT which revealed mild uptake in the LLL nodule.  The patient underwent biopsy of the nodule which revealed atypical cells suspicious for carcinoma.  The patient presented to Dr. Sunday Corn office on 10/25/2011.  Dr. Dorris Fetch evaluated the patient and reviewed his previous medical results.  It was felt the patient would benefit from a VATS with wedge resection and possible lobectomy.  Dr. Dorris Fetch spoke with the patient and his wife in  depth regarding the benefits and risks of the procedure, as well as the likelihood this nodule is cancer.  The patient agreed to proceed with surgery that was scheduled for 11/02/2011.       Hospital Course:   On 11/02/2011 Kyle Long presented to [facility] and underwent left sided Video Assisted Thoracotomy with wedge resection of the left lower lobe which frozen pathology confirmed the presence of Adenocarcinoma.  At that time the remaining portion of the left lower lobe was removed.  The patient also underwent mediastinal lymph node dissection.  The patient tolerated the procedure well and was extubated in the operating room and was taken to the PACU in stable condition.  POD #1  The patient's arterial line was removed without difficulty. The patient ambulated with chest tube off suction, small air leak appreciated.  The chest tube was placed back on suction. POD #2 CXR reveals a small left apical pneumothorax.  The patients posterior chest tube was removed without difficulty.  The anterior chest tube was placed to waterseal.  POD #3 the patients CXR again confirms a small left apical pneumothorax which was stable from previous film.  His final chest tube was removed without difficulty.  The patients PCA was discontinued.  The patient will undergo repeat CXR in AM.  If the pneumothorax resolves or remains stable the patient will be ready for discharge in the next 24-48 hours.  The final pathology results have not been confirmed.  We will see patient in our office in 2  weeks with a CXR.  At that time we will make a referral to Hematology Oncology for further treatment  Disposition: Home  Discharge Orders    Future Appointments: Provider: Department: Dept Phone: Center:   11/21/2011 11:00 AM Loreli Slot, MD [facility] [phone] [id number]     Medication List  As of 11/05/2011 11:16 AM   TAKE these medications         albuterol 108 (90 BASE) MCG/ACT inhaler   Commonly known as:  PROVENTIL HFA;VENTOLIN HFA   Inhale 2 puffs into the lungs every 6 (six) hours as needed for wheezing or shortness of breath.      chlorpheniramine-HYDROcodone 10-8 MG/5ML Lqcr   Commonly known as: TUSSIONEX      diltiazem 300 MG 24 hr capsule   Commonly known as: CARDIZEM CD   Take 1 capsule (300 mg total) by mouth daily.      fluticasone 50 MCG/ACT nasal spray   Commonly known as: FLONASE      HYDROcodone-homatropine 5-1.5 MG/5ML syrup   Commonly known as: HYCODAN      lisinopril 40 MG tablet   Commonly known as: PRINIVIL,ZESTRIL   Take 1 tablet (40 mg total) by mouth daily.      mupirocin ointment 2 %   Commonly known as: BACTROBAN      oxyCODONE-acetaminophen 5-325 MG per tablet   Commonly known as: PERCOCET   Take 1-2 tablets by mouth every 4 (four) hours as needed.      traMADol 50 MG tablet   Commonly known as: ULTRAM   Take 1-2 tablets (50-100 mg total) by mouth every 6 (six) hours as needed.           Follow-up Information    Follow up with Loreli Slot, MD on 11/21/2011. (Appointment is at 11:00AM, please get CXR at 10:00AM prior to appointment)    Contact information:   [address] [employer] [address] [place] [address] [place] [address] [phone]          Signed: Lowella Dandy 11/05/2011, 11:16 AM

## 2011-11-05 NOTE — Progress Notes (Signed)
Wasted PCS fentanyl post CT d/c. 43 ml. Witness: Serena Croissant, RN

## 2011-11-05 NOTE — Discharge Instructions (Signed)
Thoracotomy Care After Refer to this sheet in the next few weeks. These instructions provide you with information on caring for yourself after your procedure. Your caregiver may also give you more specific instructions. Your treatment has been planned according to current medical practices, but problems sometimes occur. Call your caregiver if you have any problems or questions after your procedure. HOME CARE INSTRUCTIONS  Remove the bandage (dressing) over your chest tube site as directed by your caregiver.   It is normal to be sore for a couple weeks following surgery. See your caregiver if this seems to be getting worse rather than better.   Only take over-the-counter or prescription medicines for pain, discomfort, or fever as directed by your caregiver. It is very important to take pain medicine when you need it so that you will cough and breathe deeply enough to clear mucus (phlegm) and expand your lungs. This helps prevent a lung infection (pneumonia).   If it hurts to cough, hold a pillow against your chest when you cough. This may help with the discomfort. In spite of the discomfort, cough frequently.   Taking deep breaths keeps lungs inflated and protects against pneumonia. Most patients will go home with a device called an incentive spirometer that encourages deep breathing.   You may resume a normal diet and activities as directed.   Use showers for bathing until you see your caregiver, or as instructed.   Change dressings if necessary or as directed.   Avoid lifting or driving until you are instructed otherwise.   Make an appointment to see your caregiver for stitch (suture) or staple removal when instructed.   Do not travel by airplane for 2 weeks after the chest tube is removed.  SEEK MEDICAL CARE IF:  You are bleeding from your wounds.   Your heartbeat seems irregular.   You have redness, swelling, or increasing pain in the wounds.   There is pus coming from your  wounds.   There is a bad smell coming from the wound or dressing.  SEEK IMMEDIATE MEDICAL CARE IF:  You have a fever.   You develop a rash.   You have difficulty breathing.   You develop any reaction or side effects to medicines given.   You develop lightheadedness or feel faint.   You develop shortness of breath or chest pain.  MAKE SURE YOU:  Understand these instructions.   Will watch your condition.   Will get help right away if you are not doing well or get worse.  Document Released: 12/29/2010 Document Revised: 07/05/2011 Document Reviewed: 12/29/2010 [facility] Patient Information 2012 [place], [place].  Lung Cancer Lung cancer is a tumor which starts as a growth in your lungs. Cancer is a group of many related diseases that begin in cells, the building blocks of the body. Normally, cells grow and divide to produce more cells only when the body needs them. Sometimes cells keep dividing when new cells are not needed. These extra cells may form a mass of tissue called a growth or tumor. Tumors can be either benign (not cancerous) or malignant (cancerous). Cancer can begin in any organ or tissue of the body. The original tumor (where the tumor started out) is called the primary cancer and is usually named for where it begins.  Lung cancer is the most common cause of cancer death in men and women. There are several different types of lung cancers. Usually, lung cancer is described as either small-cell lung cancer or non-small-cell lung cancer.  Other types of cancer occur in the lungs, including carcinoid and cancers spread from other organs. The types of cancer have different behavior and treatment. CAUSES  This cancer usually starts when the lungs are exposed to harmful chemicals. When you quit smoking, your risk of lung cancer falls each year (but is never the same as a person who has never smoked).  Other risks include:   Radon gas exposure.   Asbestos and other industrial  substance exposure.   Second hand tobacco smoke.   Air pollution.   Family or personal history of lung cancer.   Age over 51.  SYMPTOMS  Lung cancer can cause many symptoms. They depend on the type of cancer, its location and other factors. Symptoms of lung cancer can include:  Cough (either new, different or more severe).   Shortness of breath.   Coughing up blood (hemoptysis).   Chest pain.   Hoarseness.   Swelling of the face.   Drooping eyelid.   Changes in blood tests: low sodium (hyponatremia), high calcium (hypercalcemia) or low blood count (anemia).   Weight loss.  In its early stages, lung cancer may not have symptoms and can be discovered by accident. Many of the symptoms above can be caused by diseases other than lung cancer. DIAGNOSIS  In early lung cancer, the patient often does not notice problems. It usually has spread by the time problems are first noticed. Your caregiver may suspect lung cancer based on your symptoms, your exam or based on tests (such as x-rays) obtained for other reasons. Common tests that help your caregiver diagnose your condition include:  Chest x-ray.   CT scan of the lungs and chest.   Blood tests.  If a tumor is found, a biopsy will be necessary to confirm that cancer is present and to determine the type of cancer. TREATMENT   Surgery offers a hope for a cure if the cancer has not spread and the cancer is not a small cell (oat cell) cancer of the lung. Surgery cannot cure the small cell type of cancer.   Radiation Therapy is a form of high energy X-ray that helps slow or kill the cancer. It is often used along with medications (chemotherapy) to help treat the cancer and control pain.   Chemotherapy is used in combination with surgery in advanced cancer. It is also used in all small cell cancers.   Many new treatments look promising.   Your caregiver can give you more information and discuss treatment options that are best for  your type of cancer.  HOME CARE INSTRUCTIONS   If you smoke, stop!   Take all medications as told.   Keep all appointments with your caregiver and other specialists.   Ask your caregiver if you should see a cancer specialist, if that has not been arranged.   If you require oxygen or breathing equipment, be sure you know how to use it and who to call with questions.   Follow any special diet directions. If you have problems with appetite, ask your caregiver for help.  SEEK MEDICAL CARE IF:   You have had a surgical procedure are you are having trouble recovering.   You have ongoing weight loss.   You have decreased strength or energy past the point when your caregiver said you would feel better.   You develop nausea or lightheadedness.   You have pain that is not improving.  SEEK IMMEDIATE MEDICAL CARE IF:   You cough up clotted  blood or bright red blood.   Your pain is uncontrolled.   You develop new difficulty breathing or chest pain.   You develop swelling in one or both ankles or legs, or swelling in your face or neck.   You develop new headache or confusion.  Document Released: 10/22/2000 Document Revised: 07/05/2011 Document Reviewed: 08/02/2008 [facility] Patient Information 2012 [place], [place].

## 2011-11-05 NOTE — Progress Notes (Addendum)
Subjective:  Mr. Calderwood has no complaints this morning.  He did ask if his pathology had come back on his lymph nodes.   Objective:  Vital Signs in the last 24 hours: Temp:  [98.3 F (36.8 C)-99 F (37.2 C)] 98.8 F (37.1 C) (04/08 0800) Pulse Rate:  [78-97] 78  (04/08 0411) Resp:  [18-20] 19  (04/08 0411) BP: (133-147)/(70-83) 145/83 mmHg (04/08 0800) SpO2:  [94 %-98 %] 96 % (04/08 0411) FiO2 (%):  [21 %] 21 % (04/08 0000)  Intake/Output from previous day: 04/07 0701 - 04/08 0700 In: 1440 [P.O.:1320; I.V.:120] Out: 2020 [Urine:1850; Chest Tube:170] Intake/Output from this shift:    Physical Exam: General appearance: alert and no distress Lungs: clear to auscultation bilaterally Heart: regular rate and rhythm Abdomen: soft, non-tender; bowel sounds normal; no masses,  no organomegaly Skin: incision/chest tube site clean with no drainage present  Lab Results:  Basename 11/04/11 0510 11/03/11 0409  WBC 10.1 10.0  HGB 10.6* 11.0*  PLT 105* 109*    Basename 11/04/11 0510 11/03/11 0409  NA 137 136  K 3.8 3.8  CL 103 103  CO2 27 24  GLUCOSE 123* 163*  BUN 12 11  CREATININE 0.93 0.81   No results found for this basename: TROPONINI:2,CK,MB:2 in the last 72 hours Hepatic Function Panel  Basename 11/04/11 0510  PROT 5.5*  ALBUMIN 2.9*  AST 18  ALT 24  ALKPHOS 40  BILITOT 0.7  BILIDIR --  IBILI --   No results found for this basename: CHOL in the last 72 hours No results found for this basename: PROTIME in the last 72 hours  Imaging: Imaging results have been reviewed: small left apical pnemothorax, unchanged in size from previous film, small left basilar pleural effusion and atelectasis  Assessment/Plan:   1. S/P Left VATS with Left wedge resection 2. Chest tube placed to water seal yesterday, small stable apical pneumothorax remains, no air leak appreciated, CT with 170cc of output yesterday 3. D/C PCA, continue Percocet prn pain 4. Dispo- possible d/c  final chest tube today, with possible d/c home in next 24-48 hours   LOS: 3 days    BARRETT, ERIN 11/05/2011, 8:56 AM     Chart reviewed, patient examined, agree with above. CXR shows a small left apical ptx that is unchanged from yesterday. There is no air leak from tube.  I suspect this is a small apical space after lobectomy.  Will DC tube.  Path still pending.

## 2011-11-06 ENCOUNTER — Inpatient Hospital Stay (HOSPITAL_COMMUNITY): Payer: Medicare HMO

## 2011-11-06 NOTE — Progress Notes (Signed)
                    [address]            [place] [address]          [phone]     4 Days Post-Op Procedure(s) (LRB): VIDEO ASSISTED THORACOSCOPY (VATS)/WEDGE RESECTION (Left) LOBECTOMY (Left)  Subjective: Comfortable, no complaints. Wants to go home.  Objective: Vital signs in last 24 hours: Patient Vitals for the past 24 hrs:  BP Temp Temp src Pulse Resp SpO2  11/06/11 0409 123/73 mmHg 98.2 F (36.8 C) Oral 70  20  97 %  11/06/11 0000 153/76 mmHg 98.9 F (37.2 C) Oral 91  17  98 %  11/05/11 1915 154/73 mmHg 98.9 F (37.2 C) Oral 91  26  98 %  11/05/11 1600 155/78 mmHg 99.3 F (37.4 C) Oral 84  24  96 %  11/05/11 1200 154/81 mmHg 99.1 F (37.3 C) Oral - - -  11/05/11 0800 145/83 mmHg 98.8 F (37.1 C) Oral - - -   Current Weight  11/02/11 102.422 kg (225 lb 12.8 oz)     Intake/Output from previous day: 04/08 0701 - 04/09 0700 In: 140 [P.O.:120; I.V.:20] Out: 0     PHYSICAL EXAM:  Heart: RRR Lungs: clear Wound: clean and dry    Lab Results: CBC: Basename 11/04/11 0510  WBC 10.1  HGB 10.6*  HCT 31.4*  PLT 105*   BMET:  Basename 11/04/11 0510  NA 137  K 3.8  CL 103  CO2 27  GLUCOSE 123*  BUN 12  CREATININE 0.93  CALCIUM 8.4    CXR: stable small L apical ptx  Path: invasive moderately differentiated adenocarcinoma (T1a, N1)  Assessment/Plan: S/P Procedure(s) (LRB): VIDEO ASSISTED THORACOSCOPY (VATS)/WEDGE RESECTION (Left) LOBECTOMY (Left) CXR stable, pt doing well. Plan discharge home today. Instructions reviewed with pt.   LOS: 4 days    Kyle Long H 11/06/2011

## 2011-11-06 NOTE — Progress Notes (Signed)
D/c CL, Pt tolerated. Reviewed d/c instructions + f/up appts, meds, etc. VSS

## 2011-11-09 NOTE — Progress Notes (Signed)
UR Completed.  Dahmir Epperly Jane [phone] 11/09/2011  

## 2011-11-12 ENCOUNTER — Ambulatory Visit (INDEPENDENT_AMBULATORY_CARE_PROVIDER_SITE_OTHER): Payer: Medicare HMO | Admitting: Internal Medicine

## 2011-11-12 ENCOUNTER — Ambulatory Visit (INDEPENDENT_AMBULATORY_CARE_PROVIDER_SITE_OTHER): Payer: Self-pay

## 2011-11-12 ENCOUNTER — Encounter: Payer: Self-pay | Admitting: Internal Medicine

## 2011-11-12 VITALS — BP 138/90 | HR 97 | Temp 98.3°F | Wt 219.6 lb

## 2011-11-12 DIAGNOSIS — T464X5A Adverse effect of angiotensin-converting-enzyme inhibitors, initial encounter: Secondary | ICD-10-CM

## 2011-11-12 DIAGNOSIS — R05 Cough: Secondary | ICD-10-CM

## 2011-11-12 DIAGNOSIS — Z4802 Encounter for removal of sutures: Secondary | ICD-10-CM

## 2011-11-12 DIAGNOSIS — I10 Essential (primary) hypertension: Secondary | ICD-10-CM

## 2011-11-12 DIAGNOSIS — D381 Neoplasm of uncertain behavior of trachea, bronchus and lung: Secondary | ICD-10-CM

## 2011-11-12 MED ORDER — LOSARTAN POTASSIUM 100 MG PO TABS: 100.0000 mg | ORAL_TABLET | Freq: Every day | ORAL | Status: DC

## 2011-11-12 NOTE — Progress Notes (Signed)
Removed 4 sutures from chest tube sites. Chest tube sites were red, no drainage, no fever or chills. PA (DZ) checked sites. No signs of infection and instructed on care of incision sites. Pt is scheduled for post op appointment on 11/21/11 with Dr Dorris Fetch.

## 2011-11-12 NOTE — Patient Instructions (Addendum)
The cough should gradually improve and resolve off the lisinopril. Blood Pressure Goal  Ideally is an AVERAGE < 135/85. This AVERAGE should be calculated from @ least 5-7 BP readings taken @ different times of day on different days of week. You should not respond to isolated BP readings , but rather the AVERAGE for that week

## 2011-11-12 NOTE — Progress Notes (Signed)
  Subjective:    Patient ID: Kyle Long, male    DOB: [birth year], 66 y.o.   MRN: [id number]  HPI He's had a cough since December, 2012. It did improve but returned. He believes is related to diltiazem as each increase in dose has exacerbated the cough. He is on lisinopril at 40 mg daily.  He has no past medical history of asthma. He has a 20-pack-year smoking history  He has no sequelae from the lobectomy earlier this month      Review of Systems He denies itchy watery eyes, rhinitis, frontal headaches, facial pain, nasal purulence, sore throat, difficulty swallowing, or significant dyspepsia. He's had no sputum production, wheezing  and no hemoptysis.     Objective:   Physical Exam  General appearance:good health ;well nourished; no acute distress or increased work of breathing is present.  No  lymphadenopathy about the head, neck, or axilla noted.   Eyes: No conjunctival inflammation or lid edema is present.   Ears:  External ear exam shows no significant lesions or deformities.  Otoscopic examination reveals clear canals, tympanic membranes are intact bilaterally without bulging, retraction, inflammation or discharge.  Nose:  External nasal examination shows no deformity or inflammation. Nasal mucosa are pink and moist without lesions or exudates. Minimal  septal  deviation.No obstruction to airflow.   Oral exam: Dental hygiene is good; lips and gums are healthy appearing.There is no oropharyngeal erythema or exudate noted.      Heart:  Normal rate and regular rhythm. S1 and S2 normal without  murmur, click, or  rub . S 4 Lungs:Chest clear to auscultation; no wheezes, rhonchi,rales ,or rubs present.No increased work of breathing. Absent breath sounds left lower lobe as expected. Mild erythema around sutures left lateral thorax without purulence  Extremities:  No cyanosis, edema, or clubbing  noted    Skin: Warm & dry w/o jaundice or tenting.         Assessment & Plan:    #1 cough related to ACE inhibitor with increased airway bradykinen  Plan: Substitute losartan for lisinopril

## 2011-11-15 ENCOUNTER — Other Ambulatory Visit: Payer: Self-pay | Admitting: Thoracic Surgery (Cardiothoracic Vascular Surgery)

## 2011-11-15 DIAGNOSIS — D381 Neoplasm of uncertain behavior of trachea, bronchus and lung: Secondary | ICD-10-CM

## 2011-11-21 ENCOUNTER — Ambulatory Visit
Admission: RE | Admit: 2011-11-21 | Discharge: 2011-11-21 | Disposition: A | Discharge status: Home / Self Care | Payer: Medicare HMO | Source: Ambulatory Visit | Attending: Thoracic Surgery (Cardiothoracic Vascular Surgery) | Admitting: Thoracic Surgery (Cardiothoracic Vascular Surgery)

## 2011-11-21 ENCOUNTER — Encounter: Payer: Self-pay | Admitting: Thoracic Surgery (Cardiothoracic Vascular Surgery)

## 2011-11-21 ENCOUNTER — Ambulatory Visit (INDEPENDENT_AMBULATORY_CARE_PROVIDER_SITE_OTHER): Payer: Self-pay | Admitting: Thoracic Surgery (Cardiothoracic Vascular Surgery)

## 2011-11-21 VITALS — BP 118/112 | HR 94 | Resp 18 | Ht 70.5 in | Wt 218.0 lb

## 2011-11-21 DIAGNOSIS — D381 Neoplasm of uncertain behavior of trachea, bronchus and lung: Secondary | ICD-10-CM

## 2011-11-21 DIAGNOSIS — Z9889 Other specified postprocedural states: Secondary | ICD-10-CM

## 2011-11-21 DIAGNOSIS — C3490 Malignant neoplasm of unspecified part of unspecified bronchus or lung: Secondary | ICD-10-CM

## 2011-11-21 DIAGNOSIS — Z902 Acquired absence of lung [part of]: Secondary | ICD-10-CM

## 2011-11-21 NOTE — Progress Notes (Addendum)
HPI:  [name] returns for routine postoperative follow-up after having a left lower lobectomy on April 5. This lesion turned out to be a T1 N1 stage IIa non-small cell carcinoma. His postoperative course was unremarkable his recovery was relatively rapid. He has continued to do well since discharge the hospital. He is not taking any pain medication although he does have some discomfort. He is anxious to resume full activities. He is also anxious to meet with oncology regarding chemotherapy.   Past Medical History  Diagnosis Date  . Hypertension   . Peyronie disease   . Gilbert's syndrome   . Hyperlipidemia   . Amnesia     isolated;for 30 mins w/elevated BP  . Hematuria 2009    Dr Merry Lofty  . Hyperglycemia   . Diverticulosis of colon   . Cellulitis 2011    LUE  . Peptic ulcer 1995  . Recurrent upper respiratory infection (URI)     09/2011  BRONCHITIS  . GERD (gastroesophageal reflux disease)     OCC HEARTBURN  . Neuromuscular disorder     2010 TEMPORARY MEMORY LOSS 1-1.5 HR WAS CHECKED OUT AT [facility] , NOTHING FOUND  . Cancer     skin  . Arthritis     Current Outpatient Prescriptions  Medication Sig Dispense Refill  . losartan (COZAAR) 100 MG tablet Take 1 tablet (100 mg total) by mouth daily.  90 tablet  3  . chlorpheniramine-HYDROcodone (TUSSIONEX) 10-8 MG/5ML LQCR       . diltiazem (CARDIZEM CD) 300 MG 24 hr capsule Take 1 capsule (300 mg total) by mouth daily.  30 capsule  3  . fluticasone (FLONASE) 50 MCG/ACT nasal spray       . HYDROcodone-homatropine (HYCODAN) 5-1.5 MG/5ML syrup       . mupirocin ointment (BACTROBAN) 2 %         Physical Exam BP 118/112  Pulse 94  Resp 18  Ht 5' 10.5" (1.791 m)  Wt 218 lb (98.884 kg)  BMI 30.84 kg/m2  SpO2 97% Lungs diminished breath sounds left base, otherwise clear Wound healing well  Diagnostic Tests: Chest x-ray report increased left pleural effusion. In my view it appears that the loose is filling the  residual space left by the left lower lobectomy. There's no unusual appearing effusion.  Impression: Doing extremely well now about 3 weeks out from a left lower lobectomy. His progress is really quite remarkable. It's very unusual to not require pain medication early on. His exercise tolerance is good. He is very anxious to resume his normal activities. I do think it will be okay for him to drive at this point. He is not to drive at high speeds or for more than 20-30 minutes for at least the next 2 weeks. After that he may increase his driving as tolerated. Advised not to lift the objects that weigh greater than 20 pounds for another 2 weeks.  We did discuss the pathology he had 4 separate level XI (N1) nodes that were positive. He has a stage II a disease. He will need adjuvant chemotherapy.  I did discuss with them the possibility of a Port-A-Cath placement if chemotherapy is recommended and he chooses to do it. I discussed the general nature of the procedure. We discussed the risks and benefits. He understands the risk include bleeding, infection, pneumothorax, and late venous thrombosis.  Plan: We will schedule him an appointment with Dr. Arbutus Ped to discuss adjuvant chemotherapy.  I will plan see him back  in 2 months with a plain chest x-ray unless he has had other imaging in the interim.  Addendum  He did complain of a dry cough is been present since surgery. Dr. Alwyn Ren discontinued his lisinopril, his cough persists. I explained that this could still be due to lisinopril and may take a while to resolve or it could be due to his recent surgery as we sometimes see that for lobectomy. I gave him a prescription for Tussionex 5 mL by mouth twice a day when necessary for cough.

## 2011-11-22 ENCOUNTER — Telehealth: Payer: Self-pay | Admitting: Internal Medicine

## 2011-11-22 NOTE — Telephone Encounter (Signed)
Referred by Dr. Dorris Fetch Dx- Lung Ca

## 2011-11-22 NOTE — Telephone Encounter (Signed)
s/w and he is aware of new pt appt   aom

## 2011-11-27 ENCOUNTER — Ambulatory Visit: Payer: Medicare HMO

## 2011-11-27 ENCOUNTER — Other Ambulatory Visit (HOSPITAL_BASED_OUTPATIENT_CLINIC_OR_DEPARTMENT_OTHER): Payer: Medicare HMO | Admitting: Lab

## 2011-11-27 ENCOUNTER — Ambulatory Visit (HOSPITAL_BASED_OUTPATIENT_CLINIC_OR_DEPARTMENT_OTHER): Payer: Medicare HMO | Admitting: Internal Medicine

## 2011-11-27 ENCOUNTER — Telehealth: Payer: Self-pay | Admitting: *Deleted

## 2011-11-27 ENCOUNTER — Telehealth: Payer: Self-pay | Admitting: Internal Medicine

## 2011-11-27 ENCOUNTER — Other Ambulatory Visit: Payer: Self-pay | Admitting: Internal Medicine

## 2011-11-27 VITALS — BP 175/100 | HR 95 | Temp 97.9°F | Ht 70.5 in | Wt 216.9 lb

## 2011-11-27 DIAGNOSIS — C3490 Malignant neoplasm of unspecified part of unspecified bronchus or lung: Secondary | ICD-10-CM

## 2011-11-27 DIAGNOSIS — C3432 Malignant neoplasm of lower lobe, left bronchus or lung: Secondary | ICD-10-CM | POA: Insufficient documentation

## 2011-11-27 DIAGNOSIS — C3430 Malignant neoplasm of lower lobe, unspecified bronchus or lung: Secondary | ICD-10-CM

## 2011-11-27 HISTORY — DX: Malignant neoplasm of lower lobe, left bronchus or lung: C34.32

## 2011-11-27 LAB — CBC WITH DIFFERENTIAL/PLATELET
Basophils Absolute: 0 10*3/uL (ref 0.0–0.1)
Eosinophils Absolute: 0.2 10*3/uL (ref 0.0–0.5)
HCT: 39.4 % (ref 38.4–49.9)
HGB: 13.4 g/dL (ref 13.0–17.1)
MCH: 31.7 pg (ref 27.2–33.4)
MONO#: 0.5 10*3/uL (ref 0.1–0.9)
NEUT#: 5 10*3/uL (ref 1.5–6.5)
NEUT%: 51.7 % (ref 39.0–75.0)
WBC: 9.7 10*3/uL (ref 4.0–10.3)
lymph#: 3.9 10*3/uL — ABNORMAL HIGH (ref 0.9–3.3)

## 2011-11-27 LAB — COMPREHENSIVE METABOLIC PANEL
Albumin: 4.4 g/dL (ref 3.5–5.2)
BUN: 16 mg/dL (ref 6–23)
CO2: 25 mEq/L (ref 19–32)
Calcium: 9.5 mg/dL (ref 8.4–10.5)
Chloride: 103 mEq/L (ref 96–112)
Glucose, Bld: 105 mg/dL — ABNORMAL HIGH (ref 70–99)
Potassium: 3.7 mEq/L (ref 3.5–5.3)
Sodium: 140 mEq/L (ref 135–145)
Total Protein: 6.8 g/dL (ref 6.0–8.3)

## 2011-11-27 MED ORDER — CYANOCOBALAMIN 1000 MCG/ML IJ SOLN
1000.0000 ug | Freq: Once | INTRAMUSCULAR | Status: AC
Start: 2011-11-27 — End: 2011-11-27
  Administered 2011-11-27: 1000 ug via INTRAMUSCULAR

## 2011-11-27 NOTE — Telephone Encounter (Signed)
Gv pt app for ct scan on 05/07 @ [facility] with chemo class on 05/07.  Informed pt that he can pick completed scheduled on 05/07.  sent email to michele to statrt chemo on 05/14

## 2011-11-27 NOTE — Progress Notes (Signed)
[facility] Telephone:([phone]) [phone]   [phone]  CONSULT NOTE  REASON FOR CONSULTATION:  66 years old white male diagnosed with lung cancer.  HPI Summer Parthasarathy is a 66 y.o. male was past medical history significant for hypertension, dyslipidemia, Gilbert's syndrome, GERD and remote history of smoking. The patient mentions that since December 2012, he has been complaining of dry cough. He saw his primary care physician and was treated for bronchitis. There was no improvement in his condition and chest x-ray was performed on 10/02/2011 and it showed questionable mid left lung nodule. This was followed by CT scan of the chest on 10/04/2011 and it showed microlobulated subpleural nodule in the superior segment left lower lobe measures 1.7 x 0.9 cm and corresponds to the questioned abnormality on recent chest radiograph. There was also 5 mm right upper lobe nodule, nonspecific and borderline enlarged high right paratracheal lymph node. A PET scan on 10/10/2011 showed borderline malignant range hypermetabolism within the left lower lobe nodule. Together with the CT morphology, primary bronchogenic carcinoma is favored. If this is a non-small cell lung cancer, it is most consistent with T1aN0M0 or a stage IA lesion. On 10/15/2011 The patient underwent CT-guided biopsy of the left lower lobe pulmonary nodule by interventional radiology and the final pathology showed atypical cells suspicious for carcinoma. On 04/05/ 2013 the patient underwent Left video-assisted thoracoscopy, wedge resection of left lower lobe nodule, thoracoscopic left lower lobectomy, mediastinal lymph node dissection. The final pathology showed invasive moderately differentiated adenocarcinoma measuring 1.6 CM confined within the lung parenchyma with no evidence of angiolymphatic invasion or pleural involvement identified. Three lymph nodes from level L 11 showed metastatic adenocarcinoma. The patient blocks were  sent for EGFR mutation and that was negative. The patient is recovering well from his surgery. He was referred to me today by Dr. Dorris Fetch for evaluation and discussion of his adjuvant chemotherapy options. The patient continues to have dry cough but is getting better. He also has soreness in the left side of the chest at the surgical incision site. No significant shortness of breath. No headache or blurry vision. He is married and has 3 children. He was accompanied today by his wife candy. He has a history of smoking one pack per day for around 20 years but quit in 1981. He also drinks 3 alcoholic drinks every day.     @SFHPI @  Past Medical History  Diagnosis Date  . Hypertension   . Peyronie disease   . Gilbert's syndrome   . Hyperlipidemia   . Amnesia     isolated;for 30 mins w/elevated BP  . Hematuria 2009    Dr Merry Lofty  . Hyperglycemia   . Diverticulosis of colon   . Cellulitis 2011    LUE  . Peptic ulcer 1995  . Recurrent upper respiratory infection (URI)     09/2011  BRONCHITIS  . GERD (gastroesophageal reflux disease)     OCC HEARTBURN  . Neuromuscular disorder     2010 TEMPORARY MEMORY LOSS 1-1.5 HR WAS CHECKED OUT AT [facility] , NOTHING FOUND  . Cancer     skin  . Arthritis     Past Surgical History  Procedure Date  . Colonoscopy w/ polypectomy 2005    due 2015  . Vasectomy   . Cystoscopy 2009  . Vein surgery     R ankle  . No past surgeries     BIL LASER EYE SURGERY  . Lobectomy 11/02/2011  Procedure: LOBECTOMY;  Surgeon: Loreli Slot, MD;  Location: [facility] OR;  Service: Thoracic;  Laterality: Left;  LEFT LOWER LOBECTOMY    Family History  Problem Relation Age of Onset  . Stroke Mother   . Hypertension Mother   . Cancer Mother     breast  . Cancer Father     melanoma  . Coronary artery disease Father     MI @ 30  . Hypertension Sister   . Thyroid disease Sister     ??    Social History History  Substance Use Topics  . Smoking  status: Former Smoker    Quit date: 07/31/1979  . Smokeless tobacco: Not on file   Comment: Smoked age 10 - 80 , up to 1 ppd  . Alcohol Use: 9.6 oz/week    2 Glasses of wine, 14 Shots of liquor per week    Allergies  Allergen Reactions  . Lisinopril     cough    Current Outpatient Prescriptions  Medication Sig Dispense Refill  . diltiazem (CARDIZEM CD) 300 MG 24 hr capsule Take 1 capsule (300 mg total) by mouth daily.  30 capsule  3  . losartan (COZAAR) 100 MG tablet Take 1 tablet (100 mg total) by mouth daily.  90 tablet  3  . chlorpheniramine-HYDROcodone (TUSSIONEX) 10-8 MG/5ML LQCR       . fluticasone (FLONASE) 50 MCG/ACT nasal spray       . HYDROcodone-homatropine (HYCODAN) 5-1.5 MG/5ML syrup       . mupirocin ointment (BACTROBAN) 2 %        Current Facility-Administered Medications  Medication Dose Route Frequency Provider Last Rate Last Dose  . cyanocobalamin ((VITAMIN B-12)) injection 1,000 mcg  1,000 mcg Intramuscular Once Si Gaul, MD   1,000 mcg at 11/27/11 1506    Review of Systems  A comprehensive review of systems was negative except for: Respiratory: positive for cough  Physical Exam  RUE:AVWUJ, healthy, no distress, well nourished and well developed SKIN: skin color, texture, turgor are normal HEAD: Normocephalic, No masses, lesions, tenderness or abnormalities EYES: normal EARS: External ears normal OROPHARYNX:no exudate and no erythema  NECK: supple, no adenopathy LYMPH:  no palpable lymphadenopathy, no hepatosplenomegaly LUNGS: clear to auscultation  HEART: regular rate & rhythm, no murmurs and no gallops ABDOMEN:abdomen soft, non-tender, normal bowel sounds and no masses or organomegaly BACK: Back symmetric, no curvature. EXTREMITIES:no joint deformities, effusion, or inflammation, no edema, no skin discoloration, no clubbing, no cyanosis  NEURO: alert & oriented x 3 with fluent speech, no focal motor/sensory deficits  PERFORMANCE STATUS:  ECOG 1.  Studies/Results: Dg Chest 2 View  11/21/2011  *RADIOLOGY REPORT*  Clinical Data: History of lung surgery.  Some shortness of breath.  CHEST - 2 VIEW  Comparison: 11/06/2011 and multiple previous  Findings: Central line has been removed.  The right chest is clear. Small amount of pleural air previously seen is not longer noted. There is increasing pleural fluid on the left with volume loss and/or pneumonia at the left base.  IMPRESSION: Increasing pleural effusion in the left chest.  Left base atelectasis and/or pneumonia.  Original Report Authenticated By: Thomasenia Sales, M.D.   Dg Chest 2 View  11/06/2011  *RADIOLOGY REPORT*  Clinical Data: Left pneumothorax.  CHEST - 2 VIEW  Comparison: 11/05/2011  Findings: Small left apical pneumothorax again noted, unchanged. Stable left subcutaneous air.  Left Port-A-Cath remains in place, unchanged.  Increasing left lower lobe airspace opacity, question pneumonia.  Small  left pleural effusion.  IMPRESSION: Stable left apical pneumothorax and subcutaneous emphysema.  Increasing left lower lobe infiltrate, question pneumonia.  Small left effusion.  Original Report Authenticated By: Cyndie Chime, M.D.   Dg Chest 2 View  11/05/2011  *RADIOLOGY REPORT*  Clinical Data: Status post chest tube removal.  CHEST - 2 VIEW  Comparison: Chest radiograph 42,013 at 7:43 hours.  Findings: Current radiograph is performed at 12:25 hours.  The left- sided chest tube has been removed.  Small left apical pneumothorax is unchanged compared to the chest radiograph performed earlier today.  The pleural line is projects over the posterior left 3rd rib.  Left subclavian central venous catheter is unchanged with the tip overlying the aortic arch, and seen anteriorly in the chest on the lateral view.  Heart size is stable.  Aeration at the left lung base is improving. The right lung is clear.  Subcutaneous emphysema in the left supraclavicular region, inferior left neck, and lower left  chest is similar to prior exam.  IMPRESSION:  1.  Stable small apical left pneumothorax, status post chest tube removal. Stable left-sided subcutaneous emphysema.  2.  Improving aeration of the left lung base.  3.  Stable position of left subclavian central venous catheter, as previously described, the tip overlying the aortic arch on the frontal view, possibly within the internal mammary vein or left innominate vein.  Original Report Authenticated By: Britta Mccreedy, M.D.   Dg Chest 2 View  10/31/2011  *RADIOLOGY REPORT*  Clinical Data: Preop for left VATS  CHEST - 2 VIEW  Comparison: Portable chest x-ray of 10/15/2011 and CT chest of 10/04/2011  Findings: The nodular opacity in the posterior lateral left lower lobe appears unchanged on the frontal chest x-ray.  The lungs are otherwise clear.  No effusion is seen.  Mediastinal contours are stable.  The heart is mildly enlarged.  There are degenerative changes in the lower thoracic spine.  IMPRESSION: Stable nodular lesion in the mid left lung field.  Otherwise the lungs are clear.  Original Report Authenticated By: Juline Patch, M.D.   Dg Chest Port 1 View  11/05/2011  *RADIOLOGY REPORT*  Clinical Data: Chest tube placement  PORTABLE CHEST - 1 VIEW  Comparison: 11/04/2011; 11/03/2011; 11/02/2011  Findings: Grossly unchanged cardiac silhouette and mediastinal contours with obscuration of the left heart border secondary to small left-sided effusion and left basilar heterogeneous opacities, grossly unchanged.  Interval removal of one of two left-sided chest tubes with grossly unchanged small left apical pneumothorax. Subcutaneous emphysema within the left supraclavicular fossa is grossly unchanged.  Unchanged positioning of the left subclavian approach central venous catheter with tip overlying the aortic arch.  IMPRESSION: 1.  Interval removal of one of two left-sided chest tubes with unchanged size of small left apical pneumothorax. 2.  Unchanged position of left  subclavian approach central venous catheter with tip again overlying the aortic arch.  As this finding has been communicated to the clinical team and is not felt to be arterial, it likely lies within the internal mammary vein or the left innominate vein. 3. Unchanged small left-sided effusion and left basilar opacities, likely atelectasis.  Original Report Authenticated By: Waynard Reeds, M.D.   Dg Chest Port 1 View  11/04/2011  *RADIOLOGY REPORT*  Clinical Data: Left lung cancer.  PORTABLE CHEST - 1 VIEW  Comparison: 11/03/2011  Findings: There are two left-sided chest tubes.  Tiny left apical pneumothorax is identified which appears slightly increased in volume from  previous exam.  There is a left subclavian catheter with tip in an atypical location suggesting either brachial cephalic or mammary vein. Postsurgical changes and  volume loss involving the left lung noted.  Left lower lobe airspace consolidation is unchanged from previous exam.  There has been  interval improved aeration to the right base.  IMPRESSION:  1.  Improved aeration to the right lung. 2.  Tiny left apical pneumothorax 3.  No change in aeration the left base.  Original Report Authenticated By: Rosealee Albee, M.D.   Dg Chest Port 1 View  11/03/2011  *RADIOLOGY REPORT*  Clinical Data: Status post left VATS procedure with wedge resection of left lower lobe adenocarcinoma.  PORTABLE CHEST - 1 VIEW  Comparison: 11/02/2011  Findings: Two left chest tubes remain in place.  No pneumothorax visualized.  Stable subcutaneous air at the base of the left neck. Central line again noted with the tip to the left of midline, likely in the brachiocephalic vein or mammary vein.  Aeration at both lung bases slightly improved with atelectasis remaining.  No overt edema or pleural fluid.  Stable heart size.  IMPRESSION: No pneumothorax.  Positioning of the left-sided central line suggests tip in brachiocephalic or mammary vein.  Original Report  Authenticated By: Reola Calkins, M.D.   Dg Chest Portable 1 View  11/02/2011  *RADIOLOGY REPORT*  Clinical Data: Left lung mass.  Status post VATS procedure  PORTABLE CHEST - 1 VIEW  Comparison: 10/31/2011  Findings: Two left-sided chest tubes are in place. No pneumothorax. Subcutaneous emphysema is present in the left supraclavicular region and in the left side of the neck.  The left subclavian catheter tip overlies the thoracic aorta. There is no anomalous venous structure in that site on the recent CT scan.  The possibility that the catheter is in the aorta should be considered.  This was communicated to the patient's nurse in the recovery room. Mediastinum appears slightly prominent.  There is slight atelectasis at both lung bases.  Heart size and vascularity are normal.  IMPRESSION:  1.  Possible malposition of the left subclavian catheter. Slight prominence of the mediastinum.  2.  No pneumothorax. 3.  Bibasilar atelectasis.  Critical Value/emergent results were called by telephone at the time of interpretation on 11/02/2011  at 6:20 p.m.  to  the patient's nurse in the recovery room, who verbally acknowledged these results.  Original Report Authenticated By: Gwynn Burly, M.D.     ASSESSMENT: This is a very pleasant 66 years old white male recently diagnosed with a stage II a (T1 A., N1, M0) non-small cell lung cancer consistent with adenocarcinoma with negative EGFR mutation. The patient is status post left lower lobectomy with mediastinal lymph node dissection.  PLAN: I have a lengthy discussion with the patient and his wife today about his disease stage, prognosis and treatment options. I gave the patient and information from adjuvant online showing the five-year survival benefit for adjuvant chemotherapy with platinum based regimen which is in the range of 7%. I recommended for the patient's systemic chemotherapy with 4 cycles of platinum based regimen in the form of cisplatin 75 mg/M2 and  Alimta 500 mg/M2 given every 3 weeks. I discussed with the patient adverse effect of this treatment including but not limited to alopecia, myelosuppression, nausea and vomiting, peripheral neuropathy, liver or renal dysfunction as well as hearing deficit. The patient would like to proceed with treatment as planned. He would receive vitamin B12 injection today. I also  given him prescription for Compazine 10 mg by mouth every 6 hours as needed for nausea, Decadron 4 mg by mouth twice a day day before, day of and day after the chemotherapy in addition to folic acid 1 mg by mouth daily. He would have a chemotherapy education class in 2 weeks. I expect the patient to start the first cycle of his adjuvant chemotherapy in 3 weeks and he would come back for followup visit at that time. I would complete his staging workup I ordered in CT scan of the head with and without contrast to rule out any brain metastases. I gave the patient and his wife the time to ask questions and answers and completed to their satisfactions.  All questions were answered. The patient knows to call the clinic with any problems, questions or concerns. We can certainly see the patient much sooner if necessary.  Thank you so much for allowing me to participate in the care of [facility]. I will continue to follow up the patient with you and assist in his care.  I spent 30 minutes counseling the patient face to face. The total time spent in the appointment was 60 minutes.   Rushil Kimbrell K. 11/27/2011, 3:13 PM

## 2011-11-27 NOTE — Telephone Encounter (Signed)
Per staff message from [facility], I have scheduled treatment appts. Appts in computer, [place] aware.  JMW

## 2011-12-04 ENCOUNTER — Telehealth: Payer: Self-pay | Admitting: Internal Medicine

## 2011-12-04 ENCOUNTER — Other Ambulatory Visit: Payer: Medicare HMO

## 2011-12-04 ENCOUNTER — Ambulatory Visit (HOSPITAL_COMMUNITY)
Admission: RE | Admit: 2011-12-04 | Discharge: 2011-12-04 | Disposition: A | Discharge status: Home / Self Care | Payer: Medicare HMO | Source: Ambulatory Visit | Attending: Internal Medicine | Admitting: Internal Medicine

## 2011-12-04 ENCOUNTER — Encounter: Payer: Self-pay | Admitting: *Deleted

## 2011-12-04 DIAGNOSIS — C3490 Malignant neoplasm of unspecified part of unspecified bronchus or lung: Secondary | ICD-10-CM

## 2011-12-04 MED ORDER — IOHEXOL 300 MG/ML  SOLN
100.0000 mL | Freq: Once | INTRAMUSCULAR | Status: AC | PRN
  Administered 2011-12-04: 100 mL via INTRAVENOUS

## 2011-12-04 NOTE — Telephone Encounter (Signed)
Gave pt calendar appt for May and June 2013 today

## 2011-12-07 ENCOUNTER — Telehealth: Payer: Self-pay | Admitting: Medical Oncology

## 2011-12-07 NOTE — Telephone Encounter (Signed)
requests results of brain scan done Tuesday. I told him if there was a problem he would have heard by now.  I told pt I will consult with Dr Donnald Garre and call him back on Monday.

## 2011-12-11 ENCOUNTER — Other Ambulatory Visit (HOSPITAL_BASED_OUTPATIENT_CLINIC_OR_DEPARTMENT_OTHER): Payer: Medicare HMO | Admitting: Lab

## 2011-12-11 ENCOUNTER — Ambulatory Visit (HOSPITAL_BASED_OUTPATIENT_CLINIC_OR_DEPARTMENT_OTHER): Payer: Medicare HMO

## 2011-12-11 VITALS — BP 173/90 | HR 80 | Temp 98.1°F

## 2011-12-11 DIAGNOSIS — C3490 Malignant neoplasm of unspecified part of unspecified bronchus or lung: Secondary | ICD-10-CM

## 2011-12-11 DIAGNOSIS — C3430 Malignant neoplasm of lower lobe, unspecified bronchus or lung: Secondary | ICD-10-CM

## 2011-12-11 DIAGNOSIS — Z5111 Encounter for antineoplastic chemotherapy: Secondary | ICD-10-CM

## 2011-12-11 LAB — COMPREHENSIVE METABOLIC PANEL
ALT: 27 U/L (ref 0–53)
Albumin: 4.3 g/dL (ref 3.5–5.2)
Alkaline Phosphatase: 71 U/L (ref 39–117)
CO2: 21 mEq/L (ref 19–32)
Glucose, Bld: 203 mg/dL — ABNORMAL HIGH (ref 70–99)
Potassium: 4 mEq/L (ref 3.5–5.3)
Sodium: 136 mEq/L (ref 135–145)
Total Bilirubin: 0.5 mg/dL (ref 0.3–1.2)
Total Protein: 6.9 g/dL (ref 6.0–8.3)

## 2011-12-11 LAB — CBC WITH DIFFERENTIAL/PLATELET
BASO%: 0 % (ref 0.0–2.0)
Eosinophils Absolute: 0 10*3/uL (ref 0.0–0.5)
LYMPH%: 19 % (ref 14.0–49.0)
MCHC: 35 g/dL (ref 32.0–36.0)
MONO#: 0.5 10*3/uL (ref 0.1–0.9)
MONO%: 3.4 % (ref 0.0–14.0)
NEUT#: 10.7 10*3/uL — ABNORMAL HIGH (ref 1.5–6.5)
RBC: 4.54 10*6/uL (ref 4.20–5.82)
RDW: 13.5 % (ref 11.0–14.6)
WBC: 13.8 10*3/uL — ABNORMAL HIGH (ref 4.0–10.3)

## 2011-12-11 MED ORDER — SODIUM CHLORIDE 0.9 % IV SOLN
150.0000 mg | Freq: Once | INTRAVENOUS | Status: AC
  Administered 2011-12-11: 150 mg via INTRAVENOUS
  Filled 2011-12-11: qty 5

## 2011-12-11 MED ORDER — PALONOSETRON HCL INJECTION 0.25 MG/5ML
0.2500 mg | Freq: Once | INTRAVENOUS | Status: AC
  Administered 2011-12-11: 0.25 mg via INTRAVENOUS

## 2011-12-11 MED ORDER — DEXAMETHASONE SODIUM PHOSPHATE 4 MG/ML IJ SOLN
12.0000 mg | Freq: Once | INTRAMUSCULAR | Status: AC
  Administered 2011-12-11: 12 mg via INTRAVENOUS

## 2011-12-11 MED ORDER — SODIUM CHLORIDE 0.9 % IV SOLN: Freq: Once | INTRAVENOUS | Status: DC

## 2011-12-11 MED ORDER — SODIUM CHLORIDE 0.9 % IV SOLN
500.0000 mg/m2 | Freq: Once | INTRAVENOUS | Status: AC
  Administered 2011-12-11: 1100 mg via INTRAVENOUS
  Filled 2011-12-11: qty 44

## 2011-12-11 MED ORDER — SODIUM CHLORIDE 0.9 % IV SOLN
75.0000 mg/m2 | Freq: Once | INTRAVENOUS | Status: AC
  Administered 2011-12-11: 166 mg via INTRAVENOUS
  Filled 2011-12-11: qty 166

## 2011-12-11 MED ORDER — POTASSIUM CHLORIDE 2 MEQ/ML IV SOLN
Freq: Once | INTRAVENOUS | Status: AC
  Administered 2011-12-11: 09:00:00 via INTRAVENOUS
  Filled 2011-12-11: qty 10

## 2011-12-11 NOTE — Progress Notes (Signed)
Blood pressure recheck.  Pt's b/p is lower than when first assessed.

## 2011-12-11 NOTE — Patient Instructions (Signed)
[facility] Discharge Instructions for Patients Receiving Chemotherapy  Today you received the following chemotherapy agents Alimta and Cisplatin To help prevent nausea and vomiting after your treatment, we encourage you to take your nausea medications.  Begin taking it at 7pm and take it as often as prescribed for the next 24-72 hours.   If you develop nausea and vomiting that is not controlled by your nausea medication, call the clinic. If it is after clinic hours your family physician or the after hours number for the clinic or go to the Emergency Department.   BELOW ARE SYMPTOMS THAT SHOULD BE REPORTED IMMEDIATELY:  *FEVER GREATER THAN 100.5 F  *CHILLS WITH OR WITHOUT FEVER  NAUSEA AND VOMITING THAT IS NOT CONTROLLED WITH YOUR NAUSEA MEDICATION  *UNUSUAL SHORTNESS OF BREATH  *UNUSUAL BRUISING OR BLEEDING  TENDERNESS IN MOUTH AND THROAT WITH OR WITHOUT PRESENCE OF ULCERS  *URINARY PROBLEMS  *BOWEL PROBLEMS  UNUSUAL RASH Items with * indicate a potential emergency and should be followed up as soon as possible.  One of the nurses will contact you 24 hours after your treatment. Please let the nurse know about any problems that you may have experienced. Feel free to call the clinic you have any questions or concerns. The clinic phone number is [phone].   I have been informed and understand all the instructions given to me. I know to contact the clinic, my physician, or go to the Emergency Department if any problems should occur. I do not have any questions at this time, but understand that I may call the clinic during office hours   should I have any questions or need assistance in obtaining follow up care.    __________________________________________  _____________  __________ Signature of Patient or Authorized Representative            Date                   Time    __________________________________________ Nurse's Signature

## 2011-12-11 NOTE — Progress Notes (Signed)
Pt has had 925 cc's urine out prior to start of chemo.

## 2011-12-12 ENCOUNTER — Telehealth: Payer: Self-pay

## 2011-12-12 NOTE — Telephone Encounter (Signed)
Called and spoke with pt re: chemo f/u call.  Pt states he is doing well, just has some fatigue, but no other problems.  Pt denies n/v, diarrhea, or mouth sores, and states he is eating and drinking well.  Pt states he does have drug handouts, and knows to call office if any problems.

## 2011-12-12 NOTE — Telephone Encounter (Signed)
Message copied by Abby Potash on Wed Dec 12, 2011  1:00 PM ------      Message from: Yetta Glassman H      Created: Tue Dec 11, 2011  9:11 AM      Regarding: chemo follow-up call      Contact: [phone]       Alimta, cisplatin

## 2011-12-18 ENCOUNTER — Other Ambulatory Visit (HOSPITAL_BASED_OUTPATIENT_CLINIC_OR_DEPARTMENT_OTHER): Payer: Medicare HMO | Admitting: Lab

## 2011-12-18 ENCOUNTER — Ambulatory Visit (HOSPITAL_BASED_OUTPATIENT_CLINIC_OR_DEPARTMENT_OTHER): Payer: Medicare HMO | Admitting: Internal Medicine

## 2011-12-18 ENCOUNTER — Other Ambulatory Visit: Payer: Medicare HMO | Admitting: Lab

## 2011-12-18 ENCOUNTER — Telehealth: Payer: Self-pay | Admitting: Internal Medicine

## 2011-12-18 VITALS — BP 122/82 | HR 112 | Temp 98.0°F | Ht 70.5 in | Wt 211.9 lb

## 2011-12-18 DIAGNOSIS — C3490 Malignant neoplasm of unspecified part of unspecified bronchus or lung: Secondary | ICD-10-CM

## 2011-12-18 LAB — CBC WITH DIFFERENTIAL/PLATELET
Eosinophils Absolute: 0.2 10*3/uL (ref 0.0–0.5)
HCT: 44.5 % (ref 38.4–49.9)
LYMPH%: 44.6 % (ref 14.0–49.0)
MCHC: 34.4 g/dL (ref 32.0–36.0)
MONO#: 0.1 10*3/uL (ref 0.1–0.9)
NEUT#: 5 10*3/uL (ref 1.5–6.5)
NEUT%: 51.4 % (ref 39.0–75.0)
Platelets: 198 10*3/uL (ref 140–400)
WBC: 9.7 10*3/uL (ref 4.0–10.3)

## 2011-12-18 LAB — COMPREHENSIVE METABOLIC PANEL
ALT: 84 U/L — ABNORMAL HIGH (ref 0–53)
Alkaline Phosphatase: 66 U/L (ref 39–117)
CO2: 27 mEq/L (ref 19–32)
Creatinine, Ser: 1.4 mg/dL — ABNORMAL HIGH (ref 0.50–1.35)
Total Bilirubin: 1 mg/dL (ref 0.3–1.2)

## 2011-12-18 LAB — MAGNESIUM: Magnesium: 1.9 mg/dL (ref 1.5–2.5)

## 2011-12-18 NOTE — Progress Notes (Signed)
[facility] [phone]   Fax:(336) [phone]  OFFICE PROGRESS NOTE  Marga Melnick, MD, MD [phone] [facility] [address] [place] [address]  DIAGNOSIS: Stage IIA (T1a, N1, M0) non-small cell lung cancer consistent with adenocarcinoma with negative EGFR mutation diagnosed in March of 2013.   PRIOR THERAPY: S/P left video-assisted thoracoscopy, wedge resection of left lower lobe nodule, thoracoscopic left lower lobectomy, mediastinal lymph node dissection on 11/02/2011.    CURRENT THERAPY: Adjuvant chemotherapy with cisplatin 75 mg/M2 and Alimta 500 mg/M2 every 3 weeks. She is status post 1 cycle.  INTERVAL HISTORY: Kyle Long 66 y.o. male returns to the clinic today for followup visit accompanied by his wife. The patient started the first cycle of adjuvant chemotherapy with cisplatin and Alimta last week. He tolerated it fairly well except for delayed nausea 4-5 days after his chemotherapy improved with Compazine. He has mild fatigue. He denied having any significant weight loss or night sweats. No chest pain or shortness of breath, cough or hemoptysis.   MEDICAL HISTORY: Past Medical History  Diagnosis Date  . Hypertension   . Peyronie disease   . Gilbert's syndrome   . Hyperlipidemia   . Amnesia     isolated;for 30 mins w/elevated BP  . Hematuria 2009    Dr Merry Lofty  . Hyperglycemia   . Diverticulosis of colon   . Cellulitis 2011    LUE  . Peptic ulcer 1995  . Recurrent upper respiratory infection (URI)     09/2011  BRONCHITIS  . GERD (gastroesophageal reflux disease)     OCC HEARTBURN  . Neuromuscular disorder     2010 TEMPORARY MEMORY LOSS 1-1.5 HR WAS CHECKED OUT AT [facility] , NOTHING FOUND  . Cancer     skin  . Arthritis     ALLERGIES:  is allergic to lisinopril.  MEDICATIONS:  Current Outpatient Prescriptions  Medication Sig Dispense Refill  . dexamethasone (DECADRON) 4 MG tablet Take 4 mg by mouth 2  (two) times daily with a meal. take 2 the day before , 2 the day of and 2 the day after chemo      . diltiazem (CARDIZEM CD) 300 MG 24 hr capsule Take 1 capsule (300 mg total) by mouth daily.  30 capsule  3  . folic acid (FOLVITE) 1 MG tablet Take 1 mg by mouth daily.      [name] Kitchen losartan (COZAAR) 100 MG tablet Take 1 tablet (100 mg total) by mouth daily.  90 tablet  3    SURGICAL HISTORY:  Past Surgical History  Procedure Date  . Colonoscopy w/ polypectomy 2005    due 2015  . Vasectomy   . Cystoscopy 2009  . Vein surgery     R ankle  . No past surgeries     BIL LASER EYE SURGERY  . Lobectomy 11/02/2011    Procedure: LOBECTOMY;  Surgeon: Loreli Slot, MD;  Location: [facility] OR;  Service: Thoracic;  Laterality: Left;  LEFT LOWER LOBECTOMY    REVIEW OF SYSTEMS:  A comprehensive review of systems was negative except for: Constitutional: positive for fatigue Gastrointestinal: positive for nausea   PHYSICAL EXAMINATION: General appearance: alert, cooperative and no distress Neck: no adenopathy Lymph nodes: Cervical, supraclavicular, and axillary nodes normal. Resp: clear to auscultation bilaterally Cardio: regular rate and rhythm, S1, S2 normal, no murmur, click, rub or gallop GI: soft, non-tender; bowel sounds normal; no masses,  no organomegaly Extremities: extremities normal,  atraumatic, no cyanosis or edema Neurologic: Alert and oriented X 3, normal strength and tone. Normal symmetric reflexes. Normal coordination and gait  ECOG PERFORMANCE STATUS: 1 - Symptomatic but completely ambulatory  Blood pressure 122/82, pulse 112, temperature 98 F (36.7 C), temperature source Oral, height 5' 10.5" (1.791 m), weight 211 lb 14.4 oz (96.117 kg).  LABORATORY DATA: Lab Results  Component Value Date   WBC 9.7 12/18/2011   HGB 15.3 12/18/2011   HCT 44.5 12/18/2011   MCV 93.1 12/18/2011   PLT 198 12/18/2011      Chemistry      Component Value Date/Time   NA 136 12/11/2011 0829   K 4.0  12/11/2011 0829   CL 103 12/11/2011 0829   CO2 21 12/11/2011 0829   BUN 16 12/11/2011 0829   CREATININE 0.98 12/11/2011 0829      Component Value Date/Time   CALCIUM 9.8 12/11/2011 0829   ALKPHOS 71 12/11/2011 0829   AST 18 12/11/2011 0829   ALT 27 12/11/2011 0829   BILITOT 0.5 12/11/2011 0829       RADIOGRAPHIC STUDIES: Dg Chest 2 View  11/21/2011  *RADIOLOGY REPORT*  Clinical Data: History of lung surgery.  Some shortness of breath.  CHEST - 2 VIEW  Comparison: 11/06/2011 and multiple previous  Findings: Central line has been removed.  The right chest is clear. Small amount of pleural air previously seen is not longer noted. There is increasing pleural fluid on the left with volume loss and/or pneumonia at the left base.  IMPRESSION: Increasing pleural effusion in the left chest.  Left base atelectasis and/or pneumonia.  Original Report Authenticated By: Thomasenia Sales, M.D.   Ct Head W Wo Contrast  12/04/2011  *RADIOLOGY REPORT*  Clinical Data: 66 year old male with lung cancer, about to start chemotherapy.  Staging.  CT HEAD WITHOUT AND WITH CONTRAST  Technique:  Contiguous axial images were obtained from the base of the skull through the vertex without and with intravenous contrast.  Contrast: OMNIPAQUE IOHEXOL 300 MG/ML  SOLN  Comparison: None.  Findings: Visualized paranasal sinuses and mastoids are clear.  Visualized orbits and scalp soft tissues are within normal limits.  No acute osseous abnormality identified.  Mild dystrophic basal ganglia calcifications. Cerebral volume is within normal limits for age.  No ventriculomegaly. No midline shift, mass effect, or evidence of mass lesion.  No evidence of cortically based acute infarction identified.  No acute intracranial hemorrhage identified. No abnormal enhancement identified.  IMPRESSION: No acute or metastatic intracranial abnormality.  Original Report Authenticated By: Harley Hallmark, M.D.    ASSESSMENT: This is a very pleasant 66  years old white male with a stage II a non-small cell lung cancer status post wedge resection of the left lower lobe with lymph node dissection. He is currently undergoing adjuvant chemotherapy with cisplatin and Alimta status post 1 cycle.  PLAN: The patient is tolerating his treatment fairly well. I advise him to take Compazine at scheduled basis at 3-4 days after his next cycle of the chemotherapy for the delayed nausea. He would come back for followup visit in 2 weeks with the start of cycle #2. He was advised to call me immediately if he has any concerning symptoms in the interval.  All questions were answered. The patient knows to call the clinic with any problems, questions or concerns. We can certainly see the patient much sooner if necessary.  I spent 15 minutes counseling the patient face to face. The total time  spent in the appointment was 25 minutes.

## 2011-12-18 NOTE — Telephone Encounter (Signed)
appts made and printed for pt aom °

## 2011-12-21 ENCOUNTER — Other Ambulatory Visit: Payer: Self-pay | Admitting: *Deleted

## 2011-12-21 ENCOUNTER — Encounter: Payer: Self-pay | Admitting: *Deleted

## 2011-12-21 NOTE — Telephone Encounter (Signed)
Patient called to give nurse name/strength of his antiemetic

## 2011-12-25 ENCOUNTER — Other Ambulatory Visit (HOSPITAL_BASED_OUTPATIENT_CLINIC_OR_DEPARTMENT_OTHER): Payer: Medicare HMO | Admitting: Lab

## 2011-12-25 DIAGNOSIS — C3490 Malignant neoplasm of unspecified part of unspecified bronchus or lung: Secondary | ICD-10-CM

## 2011-12-25 LAB — COMPREHENSIVE METABOLIC PANEL
Albumin: 3.9 g/dL (ref 3.5–5.2)
BUN: 13 mg/dL (ref 6–23)
Calcium: 8.8 mg/dL (ref 8.4–10.5)
Chloride: 104 mEq/L (ref 96–112)
Glucose, Bld: 93 mg/dL (ref 70–99)
Potassium: 4.3 mEq/L (ref 3.5–5.3)

## 2011-12-25 LAB — CBC WITH DIFFERENTIAL/PLATELET
Basophils Absolute: 0 10*3/uL (ref 0.0–0.1)
EOS%: 0.9 % (ref 0.0–7.0)
Eosinophils Absolute: 0.1 10*3/uL (ref 0.0–0.5)
HGB: 12.7 g/dL — ABNORMAL LOW (ref 13.0–17.1)
NEUT#: 4.3 10*3/uL (ref 1.5–6.5)
RDW: 13.3 % (ref 11.0–14.6)
lymph#: 2.9 10*3/uL (ref 0.9–3.3)

## 2012-01-01 ENCOUNTER — Telehealth: Payer: Self-pay | Admitting: Internal Medicine

## 2012-01-01 ENCOUNTER — Encounter: Payer: Self-pay | Admitting: Physician Assistant

## 2012-01-01 ENCOUNTER — Ambulatory Visit (HOSPITAL_BASED_OUTPATIENT_CLINIC_OR_DEPARTMENT_OTHER): Payer: Medicare HMO | Admitting: Physician Assistant

## 2012-01-01 ENCOUNTER — Ambulatory Visit (HOSPITAL_BASED_OUTPATIENT_CLINIC_OR_DEPARTMENT_OTHER): Payer: Medicare HMO

## 2012-01-01 ENCOUNTER — Other Ambulatory Visit (HOSPITAL_BASED_OUTPATIENT_CLINIC_OR_DEPARTMENT_OTHER): Payer: Medicare HMO | Admitting: Lab

## 2012-01-01 VITALS — BP 174/102 | HR 93 | Temp 99.3°F | Ht 70.5 in | Wt 216.2 lb

## 2012-01-01 DIAGNOSIS — C3490 Malignant neoplasm of unspecified part of unspecified bronchus or lung: Secondary | ICD-10-CM

## 2012-01-01 DIAGNOSIS — C3430 Malignant neoplasm of lower lobe, unspecified bronchus or lung: Secondary | ICD-10-CM

## 2012-01-01 DIAGNOSIS — Z5111 Encounter for antineoplastic chemotherapy: Secondary | ICD-10-CM

## 2012-01-01 LAB — COMPREHENSIVE METABOLIC PANEL
ALT: 51 U/L (ref 0–53)
AST: 25 U/L (ref 0–37)
CO2: 22 mEq/L (ref 19–32)
Chloride: 104 mEq/L (ref 96–112)
Sodium: 139 mEq/L (ref 135–145)
Total Bilirubin: 0.4 mg/dL (ref 0.3–1.2)
Total Protein: 6.7 g/dL (ref 6.0–8.3)

## 2012-01-01 LAB — CBC WITH DIFFERENTIAL/PLATELET
BASO%: 0.1 % (ref 0.0–2.0)
EOS%: 0.1 % (ref 0.0–7.0)
LYMPH%: 23 % (ref 14.0–49.0)
MCH: 31.1 pg (ref 27.2–33.4)
MCHC: 34.8 g/dL (ref 32.0–36.0)
MONO#: 0.8 10*3/uL (ref 0.1–0.9)
MONO%: 5.9 % (ref 0.0–14.0)
Platelets: 262 10*3/uL (ref 140–400)
RBC: 4.08 10*6/uL — ABNORMAL LOW (ref 4.20–5.82)
WBC: 13.2 10*3/uL — ABNORMAL HIGH (ref 4.0–10.3)
nRBC: 0 % (ref 0–0)

## 2012-01-01 LAB — MAGNESIUM: Magnesium: 1.8 mg/dL (ref 1.5–2.5)

## 2012-01-01 MED ORDER — PALONOSETRON HCL INJECTION 0.25 MG/5ML
0.2500 mg | Freq: Once | INTRAVENOUS | Status: AC
  Administered 2012-01-01: 0.25 mg via INTRAVENOUS

## 2012-01-01 MED ORDER — SODIUM CHLORIDE 0.9 % IV SOLN
150.0000 mg | Freq: Once | INTRAVENOUS | Status: AC
  Administered 2012-01-01: 150 mg via INTRAVENOUS
  Filled 2012-01-01: qty 5

## 2012-01-01 MED ORDER — SODIUM CHLORIDE 0.9 % IV SOLN
75.0000 mg/m2 | Freq: Once | INTRAVENOUS | Status: AC
  Administered 2012-01-01: 166 mg via INTRAVENOUS
  Filled 2012-01-01: qty 166

## 2012-01-01 MED ORDER — SODIUM CHLORIDE 0.9 % IV SOLN
500.0000 mg/m2 | Freq: Once | INTRAVENOUS | Status: AC
  Administered 2012-01-01: 1100 mg via INTRAVENOUS
  Filled 2012-01-01: qty 44

## 2012-01-01 MED ORDER — POTASSIUM CHLORIDE 2 MEQ/ML IV SOLN
Freq: Once | INTRAVENOUS | Status: AC
  Administered 2012-01-01: 10:00:00 via INTRAVENOUS
  Filled 2012-01-01: qty 10

## 2012-01-01 MED ORDER — SODIUM CHLORIDE 0.9 % IV SOLN
Freq: Once | INTRAVENOUS | Status: AC
  Administered 2012-01-01: 10:00:00 via INTRAVENOUS

## 2012-01-01 MED ORDER — DEXAMETHASONE SODIUM PHOSPHATE 4 MG/ML IJ SOLN
12.0000 mg | Freq: Once | INTRAMUSCULAR | Status: AC
  Administered 2012-01-01: 12 mg via INTRAVENOUS

## 2012-01-01 NOTE — Patient Instructions (Signed)
[facility] Discharge Instructions for Patients Receiving Chemotherapy  Today you received the following chemotherapy agents Alimta and Cisplatin.  To help prevent nausea and vomiting after your treatment, we encourage you to take your nausea medication.   If you develop nausea and vomiting that is not controlled by your nausea medication, call the clinic. If it is after clinic hours your family physician or the after hours number for the clinic or go to the Emergency Department.   BELOW ARE SYMPTOMS THAT SHOULD BE REPORTED IMMEDIATELY:  *FEVER GREATER THAN 100.5 F  *CHILLS WITH OR WITHOUT FEVER  NAUSEA AND VOMITING THAT IS NOT CONTROLLED WITH YOUR NAUSEA MEDICATION  *UNUSUAL SHORTNESS OF BREATH  *UNUSUAL BRUISING OR BLEEDING  TENDERNESS IN MOUTH AND THROAT WITH OR WITHOUT PRESENCE OF ULCERS  *URINARY PROBLEMS  *BOWEL PROBLEMS  UNUSUAL RASH Items with * indicate a potential emergency and should be followed up as soon as possible.  One of the nurses will contact you 24 hours after your treatment. Please let the nurse know about any problems that you may have experienced. Feel free to call the clinic you have any questions or concerns. The clinic phone number is [phone].   I have been informed and understand all the instructions given to me. I know to contact the clinic, my physician, or go to the Emergency Department if any problems should occur. I do not have any questions at this time, but understand that I may call the clinic during office hours   should I have any questions or need assistance in obtaining follow up care.    __________________________________________  _____________  __________ Signature of Patient or Authorized Representative            Date                   Time    __________________________________________ Nurse's Signature    

## 2012-01-01 NOTE — Progress Notes (Signed)
[facility] Telephone:([phone]   Fax:([phone]  OFFICE PROGRESS NOTE  Marga Melnick, MD, MD [phone] [facility] [facility] [address] [place] [address]  DIAGNOSIS: Stage IIA (T1a, N1, M0) non-small cell lung cancer consistent with adenocarcinoma with negative EGFR mutation diagnosed in March of 2013.   PRIOR THERAPY: S/P left video-assisted thoracoscopy, wedge resection of left lower lobe nodule, thoracoscopic left lower lobectomy, mediastinal lymph node dissection on 11/02/2011.    CURRENT THERAPY: Adjuvant chemotherapy with cisplatin 75 mg/M2 and Alimta 500 mg/M2 every 3 weeks. She is status post 1 cycle.  INTERVAL HISTORY: Kyle Long 66 y.o. male returns to the clinic today for followup visit. He reports that he developed mild nausea approximate 4 days after chemotherapy. He also states that he had decreased appetite for the first 4 days after chemotherapy and then returned to baseline. He developed a red flat rash approximately a week and a half after chemotherapy on his arms and chest. It is gradually getting better and is not troublesome to him at all. He voiced no specific complaints today. Denied any specific chest pain or shortness of breath no cough or hemoptysis.   MEDICAL HISTORY: Past Medical History  Diagnosis Date  . Hypertension   . Peyronie disease   . Gilbert's syndrome   . Hyperlipidemia   . Amnesia     isolated;for 30 mins w/elevated BP  . Hematuria 2009    Dr Merry Lofty  . Hyperglycemia   . Diverticulosis of colon   . Cellulitis 2011    LUE  . Peptic ulcer 1995  . Recurrent upper respiratory infection (URI)     09/2011  BRONCHITIS  . GERD (gastroesophageal reflux disease)     OCC HEARTBURN  . Neuromuscular disorder     2010 TEMPORARY MEMORY LOSS 1-1.5 HR WAS CHECKED OUT AT [facility] , NOTHING FOUND  . Cancer     skin  . Arthritis     ALLERGIES:  is allergic to lisinopril.  MEDICATIONS:  Current Outpatient  Prescriptions  Medication Sig Dispense Refill  . chlorpheniramine-HYDROcodone (TUSSIONEX) 10-8 MG/5ML LQCR       . dexamethasone (DECADRON) 4 MG tablet Take 4 mg by mouth 2 (two) times daily with a meal. take 2 the day before , 2 the day of and 2 the day after chemo      . diltiazem (CARDIZEM CD) 300 MG 24 hr capsule Take 1 capsule (300 mg total) by mouth daily.  30 capsule  3  . fluticasone (FLONASE) 50 MCG/ACT nasal spray       . folic acid (FOLVITE) 1 MG tablet Take 1 mg by mouth daily.      [name] Kitchen losartan (COZAAR) 100 MG tablet Take 1 tablet (100 mg total) by mouth daily.  90 tablet  3  . mupirocin ointment (BACTROBAN) 2 %       . prochlorperazine (COMPAZINE) 10 MG tablet Take 10 mg by mouth Every 6 hours as needed. nausea        SURGICAL HISTORY:  Past Surgical History  Procedure Date  . Colonoscopy w/ polypectomy 2005    due 2015  . Vasectomy   . Cystoscopy 2009  . Vein surgery     R ankle  . No past surgeries     BIL LASER EYE SURGERY  . Lobectomy 11/02/2011    Procedure: LOBECTOMY;  Surgeon: Loreli Slot, MD;  Location: [facility] OR;  Service: Thoracic;  Laterality: Left;  LEFT LOWER LOBECTOMY    REVIEW OF SYSTEMS:  Pertinent items are noted in HPI.   PHYSICAL EXAMINATION: General appearance: alert, cooperative and no distress Neck: no adenopathy Lymph nodes: Cervical, supraclavicular, and axillary nodes normal. Resp: clear to auscultation bilaterally Cardio: regular rate and rhythm, S1, S2 normal, no murmur, click, rub or gallop GI: soft, non-tender; bowel sounds normal; no masses,  no organomegaly Extremities: extremities normal, atraumatic, no cyanosis or edema Neurologic: Alert and oriented X 3, normal strength and tone. Normal symmetric reflexes. Normal coordination and gait Skin: Erythematous macular eruptions that blanch located on the upper extremities and anterior chest, no evidence of superinfection  ECOG PERFORMANCE STATUS: 1 - Symptomatic but completely  ambulatory  Blood pressure 174/102, pulse 93, temperature 99.3 F (37.4 C), temperature source Oral, height 5' 10.5" (1.791 m), weight 216 lb 3.2 oz (98.068 kg).  LABORATORY DATA: Lab Results  Component Value Date   WBC 13.2* 01/01/2012   HGB 12.7* 01/01/2012   HCT 36.5* 01/01/2012   MCV 89.5 01/01/2012   PLT 262 01/01/2012      Chemistry      Component Value Date/Time   NA 141 12/25/2011 1031   K 4.3 12/25/2011 1031   CL 104 12/25/2011 1031   CO2 30 12/25/2011 1031   BUN 13 12/25/2011 1031   CREATININE 0.96 12/25/2011 1031      Component Value Date/Time   CALCIUM 8.8 12/25/2011 1031   ALKPHOS 65 12/25/2011 1031   AST 19 12/25/2011 1031   ALT 34 12/25/2011 1031   BILITOT 0.4 12/25/2011 1031       RADIOGRAPHIC STUDIES: Dg Chest 2 View  11/21/2011  *RADIOLOGY REPORT*  Clinical Data: History of lung surgery.  Some shortness of breath.  CHEST - 2 VIEW  Comparison: 11/06/2011 and multiple previous  Findings: Central line has been removed.  The right chest is clear. Small amount of pleural air previously seen is not longer noted. There is increasing pleural fluid on the left with volume loss and/or pneumonia at the left base.  IMPRESSION: Increasing pleural effusion in the left chest.  Left base atelectasis and/or pneumonia.  Original Report Authenticated By: Thomasenia Sales, M.D.   Ct Head W Wo Contrast  12/04/2011  *RADIOLOGY REPORT*  Clinical Data: 66 year old male with lung cancer, about to start chemotherapy.  Staging.  CT HEAD WITHOUT AND WITH CONTRAST  Technique:  Contiguous axial images were obtained from the base of the skull through the vertex without and with intravenous contrast.  Contrast: OMNIPAQUE IOHEXOL 300 MG/ML  SOLN  Comparison: None.  Findings: Visualized paranasal sinuses and mastoids are clear.  Visualized orbits and scalp soft tissues are within normal limits.  No acute osseous abnormality identified.  Mild dystrophic basal ganglia calcifications. Cerebral volume is within  normal limits for age.  No ventriculomegaly. No midline shift, mass effect, or evidence of mass lesion.  No evidence of cortically based acute infarction identified.  No acute intracranial hemorrhage identified. No abnormal enhancement identified.  IMPRESSION: No acute or metastatic intracranial abnormality.  Original Report Authenticated By: Harley Hallmark, M.D.    ASSESSMENT/PLAN: This is a very pleasant 66 years old white male with a stage II a non-small cell lung cancer status post wedge resection of the left lower lobe with lymph node dissection. He is currently undergoing adjuvant chemotherapy with cisplatin and Alimta status post 1 cycle. The patient was discussed with Dr. Arbutus Ped. He will continue taking his dexamethasone as prescribed the day before  the day of and the day after chemotherapy. He will proceed with his second cycle of adjuvant chemotherapy with cisplatin and Alimta as scheduled. He will continue with weekly labs consisting of a CBC differential, C. met and magnesium. He'll return in 3 weeks prior to cycle #3 with a repeat CBC differential, C. met and magnesium. He'll monitor his rash and let's know if it becomes more problematic.  Laural Benes, Clarisse Rodriges E, PA-C   All questions were answered. The patient knows to call the clinic with any problems, questions or concerns. We can certainly see the patient much sooner if necessary.  I spent 20 minutes counseling the patient face to face. The total time spent in the appointment was 30 minutes.

## 2012-01-01 NOTE — Telephone Encounter (Signed)
Gave pt appt for June, appt for 6/25 moved to 6/26 per Adrena, [employer] regarding chemo

## 2012-01-01 NOTE — Telephone Encounter (Signed)
Called pt and left message reharding chemo being on his appt calendar now

## 2012-01-09 ENCOUNTER — Other Ambulatory Visit (HOSPITAL_BASED_OUTPATIENT_CLINIC_OR_DEPARTMENT_OTHER): Payer: Medicare HMO | Admitting: Lab

## 2012-01-09 DIAGNOSIS — C3490 Malignant neoplasm of unspecified part of unspecified bronchus or lung: Secondary | ICD-10-CM

## 2012-01-09 LAB — CBC WITH DIFFERENTIAL/PLATELET
BASO%: 0 % (ref 0.0–2.0)
EOS%: 1 % (ref 0.0–7.0)
HCT: 40.7 % (ref 38.4–49.9)
HGB: 13.8 g/dL (ref 13.0–17.1)
MCH: 31.1 pg (ref 27.2–33.4)
MCHC: 33.9 g/dL (ref 32.0–36.0)
MONO#: 0.2 10*3/uL (ref 0.1–0.9)
NEUT%: 58 % (ref 39.0–75.0)
RDW: 13.9 % (ref 11.0–14.6)
WBC: 7.5 10*3/uL (ref 4.0–10.3)
lymph#: 2.9 10*3/uL (ref 0.9–3.3)

## 2012-01-09 LAB — COMPREHENSIVE METABOLIC PANEL
ALT: 107 U/L — ABNORMAL HIGH (ref 0–53)
Albumin: 3.6 g/dL (ref 3.5–5.2)
BUN: 24 mg/dL — ABNORMAL HIGH (ref 6–23)
CO2: 27 mEq/L (ref 19–32)
Calcium: 8.6 mg/dL (ref 8.4–10.5)
Chloride: 104 mEq/L (ref 96–112)
Creatinine, Ser: 1.2 mg/dL (ref 0.50–1.35)
Potassium: 4.1 mEq/L (ref 3.5–5.3)

## 2012-01-15 ENCOUNTER — Other Ambulatory Visit: Payer: Medicare HMO | Admitting: Lab

## 2012-01-16 ENCOUNTER — Other Ambulatory Visit (HOSPITAL_BASED_OUTPATIENT_CLINIC_OR_DEPARTMENT_OTHER): Payer: Medicare HMO | Admitting: Lab

## 2012-01-16 DIAGNOSIS — C3490 Malignant neoplasm of unspecified part of unspecified bronchus or lung: Secondary | ICD-10-CM

## 2012-01-16 DIAGNOSIS — C3430 Malignant neoplasm of lower lobe, unspecified bronchus or lung: Secondary | ICD-10-CM

## 2012-01-16 LAB — CBC WITH DIFFERENTIAL/PLATELET
BASO%: 0.3 % (ref 0.0–2.0)
Basophils Absolute: 0 10*3/uL (ref 0.0–0.1)
EOS%: 1.8 % (ref 0.0–7.0)
HGB: 12.4 g/dL — ABNORMAL LOW (ref 13.0–17.1)
MCH: 31.4 pg (ref 27.2–33.4)
MCHC: 34.5 g/dL (ref 32.0–36.0)
MCV: 91.1 fL (ref 79.3–98.0)
MONO%: 6.1 % (ref 0.0–14.0)
RBC: 3.96 10*6/uL — ABNORMAL LOW (ref 4.20–5.82)
RDW: 14 % (ref 11.0–14.6)
lymph#: 2.7 10*3/uL (ref 0.9–3.3)

## 2012-01-16 LAB — COMPREHENSIVE METABOLIC PANEL
ALT: 38 U/L (ref 0–53)
AST: 25 U/L (ref 0–37)
Albumin: 4 g/dL (ref 3.5–5.2)
Alkaline Phosphatase: 71 U/L (ref 39–117)
BUN: 17 mg/dL (ref 6–23)
Calcium: 8.7 mg/dL (ref 8.4–10.5)
Chloride: 104 mEq/L (ref 96–112)
Potassium: 3.9 mEq/L (ref 3.5–5.3)
Sodium: 141 mEq/L (ref 135–145)
Total Protein: 6.1 g/dL (ref 6.0–8.3)

## 2012-01-16 LAB — MAGNESIUM: Magnesium: 1.6 mg/dL (ref 1.5–2.5)

## 2012-01-19 ENCOUNTER — Other Ambulatory Visit: Payer: Self-pay | Admitting: Internal Medicine

## 2012-01-22 ENCOUNTER — Encounter: Payer: Medicare HMO | Admitting: Thoracic Surgery (Cardiothoracic Vascular Surgery)

## 2012-01-22 ENCOUNTER — Other Ambulatory Visit: Payer: Self-pay | Admitting: Thoracic Surgery (Cardiothoracic Vascular Surgery)

## 2012-01-22 DIAGNOSIS — C3490 Malignant neoplasm of unspecified part of unspecified bronchus or lung: Secondary | ICD-10-CM

## 2012-01-23 ENCOUNTER — Ambulatory Visit (HOSPITAL_BASED_OUTPATIENT_CLINIC_OR_DEPARTMENT_OTHER): Payer: Medicare HMO

## 2012-01-23 ENCOUNTER — Ambulatory Visit (HOSPITAL_BASED_OUTPATIENT_CLINIC_OR_DEPARTMENT_OTHER): Payer: Medicare HMO | Admitting: Physician Assistant

## 2012-01-23 ENCOUNTER — Other Ambulatory Visit (HOSPITAL_BASED_OUTPATIENT_CLINIC_OR_DEPARTMENT_OTHER): Payer: Medicare HMO | Admitting: Lab

## 2012-01-23 ENCOUNTER — Telehealth: Payer: Self-pay | Admitting: Internal Medicine

## 2012-01-23 VITALS — BP 145/81 | HR 81 | Temp 98.3°F | Ht 70.5 in | Wt 214.1 lb

## 2012-01-23 DIAGNOSIS — Z5111 Encounter for antineoplastic chemotherapy: Secondary | ICD-10-CM

## 2012-01-23 DIAGNOSIS — C3430 Malignant neoplasm of lower lobe, unspecified bronchus or lung: Secondary | ICD-10-CM

## 2012-01-23 DIAGNOSIS — C3490 Malignant neoplasm of unspecified part of unspecified bronchus or lung: Secondary | ICD-10-CM

## 2012-01-23 LAB — COMPREHENSIVE METABOLIC PANEL
AST: 19 U/L (ref 0–37)
Alkaline Phosphatase: 67 U/L (ref 39–117)
Glucose, Bld: 147 mg/dL — ABNORMAL HIGH (ref 70–99)
Potassium: 4.3 mEq/L (ref 3.5–5.3)
Sodium: 137 mEq/L (ref 135–145)
Total Bilirubin: 0.5 mg/dL (ref 0.3–1.2)
Total Protein: 6.4 g/dL (ref 6.0–8.3)

## 2012-01-23 LAB — CBC WITH DIFFERENTIAL/PLATELET
Basophils Absolute: 0 10*3/uL (ref 0.0–0.1)
EOS%: 0 % (ref 0.0–7.0)
Eosinophils Absolute: 0 10*3/uL (ref 0.0–0.5)
HGB: 12.1 g/dL — ABNORMAL LOW (ref 13.0–17.1)
LYMPH%: 26.3 % (ref 14.0–49.0)
MCH: 31.2 pg (ref 27.2–33.4)
MCV: 88.9 fL (ref 79.3–98.0)
MONO%: 9.6 % (ref 0.0–14.0)
NEUT#: 7.1 10*3/uL — ABNORMAL HIGH (ref 1.5–6.5)
Platelets: 193 10*3/uL (ref 140–400)
RBC: 3.88 10*6/uL — ABNORMAL LOW (ref 4.20–5.82)

## 2012-01-23 MED ORDER — SODIUM CHLORIDE 0.9 % IV SOLN: Freq: Once | INTRAVENOUS | Status: DC

## 2012-01-23 MED ORDER — PALONOSETRON HCL INJECTION 0.25 MG/5ML
0.2500 mg | Freq: Once | INTRAVENOUS | Status: AC
  Administered 2012-01-23: 0.25 mg via INTRAVENOUS

## 2012-01-23 MED ORDER — DEXAMETHASONE SODIUM PHOSPHATE 4 MG/ML IJ SOLN
12.0000 mg | Freq: Once | INTRAMUSCULAR | Status: AC
  Administered 2012-01-23: 12 mg via INTRAVENOUS

## 2012-01-23 MED ORDER — SODIUM CHLORIDE 0.9 % IV SOLN
150.0000 mg | Freq: Once | INTRAVENOUS | Status: AC
  Administered 2012-01-23: 150 mg via INTRAVENOUS
  Filled 2012-01-23: qty 5

## 2012-01-23 MED ORDER — SODIUM CHLORIDE 0.9 % IV SOLN
500.0000 mg/m2 | Freq: Once | INTRAVENOUS | Status: AC
  Administered 2012-01-23: 1100 mg via INTRAVENOUS
  Filled 2012-01-23: qty 44

## 2012-01-23 MED ORDER — POTASSIUM CHLORIDE 2 MEQ/ML IV SOLN
Freq: Once | INTRAVENOUS | Status: AC
  Administered 2012-01-23: 10:00:00 via INTRAVENOUS
  Filled 2012-01-23: qty 10

## 2012-01-23 MED ORDER — CYANOCOBALAMIN 1000 MCG/ML IJ SOLN
1000.0000 ug | Freq: Once | INTRAMUSCULAR | Status: AC
  Administered 2012-01-23: 1000 ug via INTRAMUSCULAR

## 2012-01-23 MED ORDER — SODIUM CHLORIDE 0.9 % IV SOLN
75.0000 mg/m2 | Freq: Once | INTRAVENOUS | Status: AC
  Administered 2012-01-23: 166 mg via INTRAVENOUS
  Filled 2012-01-23: qty 166

## 2012-01-23 NOTE — Telephone Encounter (Signed)
pts made and printed for pt aom °

## 2012-01-24 NOTE — Progress Notes (Signed)
[facility] [phone]   [phone]  OFFICE PROGRESS NOTE  Marga Melnick, MD [phone] [facility] [place] [address] [place] [address]  DIAGNOSIS: Stage IIA (T1a, N1, M0) non-small cell lung cancer consistent with adenocarcinoma with negative EGFR mutation diagnosed in March of 2013.   PRIOR THERAPY: S/P left video-assisted thoracoscopy, wedge resection of left lower lobe nodule, thoracoscopic left lower lobectomy, mediastinal lymph node dissection on 11/02/2011.    CURRENT THERAPY: Adjuvant chemotherapy with cisplatin 75 mg/M2 and Alimta 500 mg/M2 every 3 weeks. She is status post 2 cycles.  INTERVAL HISTORY: Timoty Bourke 66 y.o. male returns to the clinic today for followup visit.He reports some mild fatigue and malaise but states this has resolved. He also reports some dizziness for the past 1-2 weeks but feels this is related to his blood pressure medications. He denied any presyncopal or syncopal episodes. He voiced no specific complaints today. Denied any specific chest pain or shortness of breath no cough or hemoptysis.   MEDICAL HISTORY: Past Medical History  Diagnosis Date  . Hypertension   . Peyronie disease   . Gilbert's syndrome   . Hyperlipidemia   . Amnesia     isolated;for 30 mins w/elevated BP  . Hematuria 2009    Dr Merry Lofty  . Hyperglycemia   . Diverticulosis of colon   . Cellulitis 2011    LUE  . Peptic ulcer 1995  . Recurrent upper respiratory infection (URI)     09/2011  BRONCHITIS  . GERD (gastroesophageal reflux disease)     OCC HEARTBURN  . Neuromuscular disorder     2010 TEMPORARY MEMORY LOSS 1-1.5 HR WAS CHECKED OUT AT [facility] , NOTHING FOUND  . Cancer     skin  . Arthritis     ALLERGIES:  is allergic to lisinopril.  MEDICATIONS:  Current Outpatient Prescriptions  Medication Sig Dispense Refill  . chlorpheniramine-HYDROcodone (TUSSIONEX) 10-8 MG/5ML LQCR       . dexamethasone  (DECADRON) 4 MG tablet Take 4 mg by mouth 2 (two) times daily with a meal. take 2 the day before , 2 the day of and 2 the day after chemo      . diltiazem (CARDIZEM CD) 300 MG 24 hr capsule Take 1 capsule (300 mg total) by mouth daily.  30 capsule  3  . fluticasone (FLONASE) 50 MCG/ACT nasal spray       . folic acid (FOLVITE) 1 MG tablet Take 1 mg by mouth daily.      [name] Kitchen losartan (COZAAR) 100 MG tablet Take 1 tablet (100 mg total) by mouth daily.  90 tablet  3  . mupirocin ointment (BACTROBAN) 2 %       . prochlorperazine (COMPAZINE) 10 MG tablet Take 10 mg by mouth Every 6 hours as needed. nausea       No current facility-administered medications for this visit.   Facility-Administered Medications Ordered in Other Visits  Medication Dose Route Frequency Provider Last Rate Last Dose  . DISCONTD: 0.9 %  sodium chloride infusion   Intravenous Once Si Gaul, MD        SURGICAL HISTORY:  Past Surgical History  Procedure Date  . Colonoscopy w/ polypectomy 2005    due 2015  . Vasectomy   . Cystoscopy 2009  . Vein surgery     R ankle  . No past surgeries     BIL LASER EYE SURGERY  . Lobectomy 11/02/2011  Procedure: LOBECTOMY;  Surgeon: Loreli Slot, MD;  Location: [facility] OR;  Service: Thoracic;  Laterality: Left;  LEFT LOWER LOBECTOMY    REVIEW OF SYSTEMS:  Pertinent items are noted in HPI.   PHYSICAL EXAMINATION: General appearance: alert, cooperative and no distress Neck: no adenopathy Lymph nodes: Cervical, supraclavicular, and axillary nodes normal. Resp: clear to auscultation bilaterally Cardio: regular rate and rhythm, S1, S2 normal, no murmur, click, rub or gallop GI: soft, non-tender; bowel sounds normal; no masses,  no organomegaly Extremities: extremities normal, atraumatic, no cyanosis or edema Neurologic: Alert and oriented X 3, normal strength and tone. Normal symmetric reflexes. Normal coordination and gait Skin: Decreased prominence of erythematous macular  eruptions that blanch located on the upper extremities and anterior chest, no evidence of superinfection  ECOG PERFORMANCE STATUS: 1 - Symptomatic but completely ambulatory  Blood pressure 145/81, pulse 81, temperature 98.3 F (36.8 C), temperature source Oral, height 5' 10.5" (1.791 m), weight 214 lb 1.6 oz (97.115 kg).  LABORATORY DATA: Lab Results  Component Value Date   WBC 11.0* 01/23/2012   HGB 12.1* 01/23/2012   HCT 34.5* 01/23/2012   MCV 88.9 01/23/2012   PLT 193 01/23/2012      Chemistry      Component Value Date/Time   NA 137 01/23/2012 0855   K 4.3 01/23/2012 0855   CL 103 01/23/2012 0855   CO2 24 01/23/2012 0855   BUN 21 01/23/2012 0855   CREATININE 1.15 01/23/2012 0855      Component Value Date/Time   CALCIUM 9.0 01/23/2012 0855   ALKPHOS 67 01/23/2012 0855   AST 19 01/23/2012 0855   ALT 21 01/23/2012 0855   BILITOT 0.5 01/23/2012 0855       RADIOGRAPHIC STUDIES: Dg Chest 2 View  11/21/2011  *RADIOLOGY REPORT*  Clinical Data: History of lung surgery.  Some shortness of breath.  CHEST - 2 VIEW  Comparison: 11/06/2011 and multiple previous  Findings: Central line has been removed.  The right chest is clear. Small amount of pleural air previously seen is not longer noted. There is increasing pleural fluid on the left with volume loss and/or pneumonia at the left base.  IMPRESSION: Increasing pleural effusion in the left chest.  Left base atelectasis and/or pneumonia.  Original Report Authenticated By: Thomasenia Sales, M.D.   Ct Head W Wo Contrast  12/04/2011  *RADIOLOGY REPORT*  Clinical Data: 66 year old male with lung cancer, about to start chemotherapy.  Staging.  CT HEAD WITHOUT AND WITH CONTRAST  Technique:  Contiguous axial images were obtained from the base of the skull through the vertex without and with intravenous contrast.  Contrast: OMNIPAQUE IOHEXOL 300 MG/ML  SOLN  Comparison: None.  Findings: Visualized paranasal sinuses and mastoids are clear.  Visualized  orbits and scalp soft tissues are within normal limits.  No acute osseous abnormality identified.  Mild dystrophic basal ganglia calcifications. Cerebral volume is within normal limits for age.  No ventriculomegaly. No midline shift, mass effect, or evidence of mass lesion.  No evidence of cortically based acute infarction identified.  No acute intracranial hemorrhage identified. No abnormal enhancement identified.  IMPRESSION: No acute or metastatic intracranial abnormality.  Original Report Authenticated By: Harley Hallmark, M.D.    ASSESSMENT/PLAN: This is a very pleasant 66 years old white male with a stage II a non-small cell lung cancer status post wedge resection of the left lower lobe with lymph node dissection. He is currently undergoing adjuvant chemotherapy with cisplatin and Alimta  status post 2 cycles. The patient was discussed with Dr. Darrold Span in Dr. Asa Lente absence. He will continue taking his dexamethasone as prescribed the day before the day of and the day after chemotherapy. He will proceed with his third cycle of adjuvant chemotherapy with cisplatin and Alimta as scheduled. He will continue with weekly labs consisting of a CBC differential, C. met and magnesium. He'll return in 3 weeks prior to cycle #4 with a repeat CBC differential, C. met and magnesium.   Laural Benes, Orval Dortch E, PA-C   All questions were answered. The patient knows to call the clinic with any problems, questions or concerns. We can certainly see the patient much sooner if necessary.  I spent 20 minutes counseling the patient face to face. The total time spent in the appointment was 30 minutes.

## 2012-01-29 ENCOUNTER — Other Ambulatory Visit (HOSPITAL_BASED_OUTPATIENT_CLINIC_OR_DEPARTMENT_OTHER): Payer: Medicare HMO | Admitting: Lab

## 2012-01-29 ENCOUNTER — Ambulatory Visit
Admission: RE | Admit: 2012-01-29 | Discharge: 2012-01-29 | Disposition: A | Discharge status: Home / Self Care | Payer: Medicare HMO | Source: Ambulatory Visit | Attending: Thoracic Surgery (Cardiothoracic Vascular Surgery) | Admitting: Thoracic Surgery (Cardiothoracic Vascular Surgery)

## 2012-01-29 ENCOUNTER — Ambulatory Visit (INDEPENDENT_AMBULATORY_CARE_PROVIDER_SITE_OTHER): Payer: Self-pay | Admitting: Thoracic Surgery (Cardiothoracic Vascular Surgery)

## 2012-01-29 ENCOUNTER — Encounter: Payer: Self-pay | Admitting: Thoracic Surgery (Cardiothoracic Vascular Surgery)

## 2012-01-29 VITALS — BP 109/76 | HR 122 | Resp 18 | Ht 70.0 in | Wt 211.0 lb

## 2012-01-29 DIAGNOSIS — D491 Neoplasm of unspecified behavior of respiratory system: Secondary | ICD-10-CM

## 2012-01-29 DIAGNOSIS — C3490 Malignant neoplasm of unspecified part of unspecified bronchus or lung: Secondary | ICD-10-CM

## 2012-01-29 LAB — COMPREHENSIVE METABOLIC PANEL
ALT: 80 U/L — ABNORMAL HIGH (ref 0–53)
Albumin: 4 g/dL (ref 3.5–5.2)
Alkaline Phosphatase: 69 U/L (ref 39–117)
CO2: 24 mEq/L (ref 19–32)
Potassium: 3.9 mEq/L (ref 3.5–5.3)
Sodium: 139 mEq/L (ref 135–145)
Total Bilirubin: 0.7 mg/dL (ref 0.3–1.2)
Total Protein: 6.3 g/dL (ref 6.0–8.3)

## 2012-01-29 LAB — CBC WITH DIFFERENTIAL/PLATELET
BASO%: 0.3 % (ref 0.0–2.0)
EOS%: 0.5 % (ref 0.0–7.0)
LYMPH%: 35.1 % (ref 14.0–49.0)
MCHC: 34.4 g/dL (ref 32.0–36.0)
MCV: 92.8 fL (ref 79.3–98.0)
MONO%: 1.4 % (ref 0.0–14.0)
Platelets: 124 10*3/uL — ABNORMAL LOW (ref 140–400)
RBC: 4.41 10*6/uL (ref 4.20–5.82)
WBC: 6.9 10*3/uL (ref 4.0–10.3)

## 2012-01-29 NOTE — Progress Notes (Signed)
  HPI:  Kyle Long returns today for scheduled 6 month followup visit. He had a left lower lobectomy for a stage II a adenocarcinoma. He has been undergoing chemotherapy and has completed 3 of 4 cycles. He is scheduled to have his final cycle in 2 weeks. He says that the chemotherapy caused him to feel very fatigued. Between cycles he feels well. He has not lost his hair and has had only mild issues with nausea. He's not had any issues with his breathing.  Past Medical History  Diagnosis Date  . Hypertension   . Peyronie disease   . Gilbert's syndrome   . Hyperlipidemia   . Amnesia     isolated;for 30 mins w/elevated BP  . Hematuria 2009    Dr Kyle Long  . Hyperglycemia   . Diverticulosis of colon   . Cellulitis 2011    LUE  . Peptic ulcer 1995  . Recurrent upper respiratory infection (URI)     09/2011  BRONCHITIS  . GERD (gastroesophageal reflux disease)     OCC HEARTBURN  . Neuromuscular disorder     2010 TEMPORARY MEMORY LOSS 1-1.5 HR WAS CHECKED OUT AT [facility] , NOTHING FOUND  . Cancer     skin  . Arthritis    Lung cancer- Stage IIA   Current Outpatient Prescriptions  Medication Sig Dispense Refill  . dexamethasone (DECADRON) 4 MG tablet Take 4 mg by mouth 2 (two) times daily with a meal. take 2 the day before , 2 the day of and 2 the day after chemo      . diltiazem (CARDIZEM CD) 300 MG 24 hr capsule Take 1 capsule (300 mg total) by mouth daily.  30 capsule  3  . folic acid (FOLVITE) 1 MG tablet Take 1 mg by mouth daily.      [name] Kitchen losartan (COZAAR) 100 MG tablet Take 1 tablet (100 mg total) by mouth daily.  90 tablet  3  . prochlorperazine (COMPAZINE) 10 MG tablet Take 10 mg by mouth Every 6 hours as needed. nausea        Physical Exam BP 109/76  Pulse 122  Resp 18  Ht 5\' 10"  (1.778 m)  Wt 211 lb (95.709 kg)  BMI 30.28 kg/m2  SpO2 99%  General well-developed well-nourished 66 year old male in no acute distress No cervical or supraclavicular  adenopathy Lungs diminished breath sounds left base otherwise clear Incisions well-healed  Diagnostic Tests: Chest x-ray shows improving aeration left base  Impression: 66 year old gentleman now 3 months out from left lower lobectomy for stage IIA adenocarcinoma. He has completed three out of four cycles of chemotherapy, and a scheduled to have his final cycle in 2 weeks. He is no evidence recurrent disease at this time. I will plan to see him back in about 3 months. Normally I would do a CT of the chest at that time. However he will have a CT of the chest scheduled by Dr. Arbutus Ped after an appropriate interval following his last cycle of chemotherapy. Therefore I will just plan to do a plain chest x-ray when he returns to see me.  Plan: Return in 3 months with a PA and lateral chest x-ray.  I will be happy to see him sooner if needed.

## 2012-01-30 ENCOUNTER — Other Ambulatory Visit: Payer: Medicare HMO | Admitting: Lab

## 2012-02-05 ENCOUNTER — Other Ambulatory Visit (HOSPITAL_BASED_OUTPATIENT_CLINIC_OR_DEPARTMENT_OTHER): Payer: Medicare HMO | Admitting: Lab

## 2012-02-05 DIAGNOSIS — C3430 Malignant neoplasm of lower lobe, unspecified bronchus or lung: Secondary | ICD-10-CM

## 2012-02-05 DIAGNOSIS — C3490 Malignant neoplasm of unspecified part of unspecified bronchus or lung: Secondary | ICD-10-CM

## 2012-02-05 LAB — CBC WITH DIFFERENTIAL/PLATELET
BASO%: 0.2 % (ref 0.0–2.0)
HCT: 34.5 % — ABNORMAL LOW (ref 38.4–49.9)
LYMPH%: 47.7 % (ref 14.0–49.0)
MCH: 32 pg (ref 27.2–33.4)
MCHC: 34.3 g/dL (ref 32.0–36.0)
MONO#: 0.6 10*3/uL (ref 0.1–0.9)
NEUT%: 43.6 % (ref 39.0–75.0)
Platelets: 92 10*3/uL — ABNORMAL LOW (ref 140–400)
WBC: 7.8 10*3/uL (ref 4.0–10.3)

## 2012-02-05 LAB — COMPREHENSIVE METABOLIC PANEL
ALT: 36 U/L (ref 0–53)
AST: 20 U/L (ref 0–37)
Alkaline Phosphatase: 69 U/L (ref 39–117)
Creatinine, Ser: 1.24 mg/dL (ref 0.50–1.35)
Total Bilirubin: 0.5 mg/dL (ref 0.3–1.2)

## 2012-02-05 LAB — MAGNESIUM: Magnesium: 1.6 mg/dL (ref 1.5–2.5)

## 2012-02-12 ENCOUNTER — Encounter: Payer: Self-pay | Admitting: Physician Assistant

## 2012-02-12 ENCOUNTER — Telehealth: Payer: Self-pay | Admitting: Internal Medicine

## 2012-02-12 ENCOUNTER — Ambulatory Visit (HOSPITAL_BASED_OUTPATIENT_CLINIC_OR_DEPARTMENT_OTHER): Payer: Medicare HMO

## 2012-02-12 ENCOUNTER — Ambulatory Visit (HOSPITAL_BASED_OUTPATIENT_CLINIC_OR_DEPARTMENT_OTHER): Payer: Medicare HMO | Admitting: Physician Assistant

## 2012-02-12 ENCOUNTER — Other Ambulatory Visit (HOSPITAL_BASED_OUTPATIENT_CLINIC_OR_DEPARTMENT_OTHER): Payer: Medicare HMO | Admitting: Lab

## 2012-02-12 VITALS — BP 156/91 | HR 89 | Temp 97.6°F | Ht 70.0 in | Wt 212.9 lb

## 2012-02-12 DIAGNOSIS — C3430 Malignant neoplasm of lower lobe, unspecified bronchus or lung: Secondary | ICD-10-CM

## 2012-02-12 DIAGNOSIS — C3490 Malignant neoplasm of unspecified part of unspecified bronchus or lung: Secondary | ICD-10-CM

## 2012-02-12 DIAGNOSIS — Z5111 Encounter for antineoplastic chemotherapy: Secondary | ICD-10-CM

## 2012-02-12 LAB — CBC WITH DIFFERENTIAL/PLATELET
Basophils Absolute: 0 10*3/uL (ref 0.0–0.1)
Eosinophils Absolute: 0 10*3/uL (ref 0.0–0.5)
HCT: 32.1 % — ABNORMAL LOW (ref 38.4–49.9)
HGB: 11.1 g/dL — ABNORMAL LOW (ref 13.0–17.1)
MCH: 31.3 pg (ref 27.2–33.4)
MONO#: 0.8 10*3/uL (ref 0.1–0.9)
NEUT#: 5.2 10*3/uL (ref 1.5–6.5)
NEUT%: 61.6 % (ref 39.0–75.0)
WBC: 8.4 10*3/uL (ref 4.0–10.3)
lymph#: 2.4 10*3/uL (ref 0.9–3.3)

## 2012-02-12 LAB — COMPREHENSIVE METABOLIC PANEL
AST: 20 U/L (ref 0–37)
BUN: 19 mg/dL (ref 6–23)
Calcium: 9.2 mg/dL (ref 8.4–10.5)
Chloride: 100 mEq/L (ref 96–112)
Creatinine, Ser: 1.03 mg/dL (ref 0.50–1.35)

## 2012-02-12 MED ORDER — PALONOSETRON HCL INJECTION 0.25 MG/5ML
0.2500 mg | Freq: Once | INTRAVENOUS | Status: AC
  Administered 2012-02-12: 0.25 mg via INTRAVENOUS

## 2012-02-12 MED ORDER — DEXAMETHASONE SODIUM PHOSPHATE 4 MG/ML IJ SOLN
12.0000 mg | Freq: Once | INTRAMUSCULAR | Status: AC
  Administered 2012-02-12: 12 mg via INTRAVENOUS

## 2012-02-12 MED ORDER — SODIUM CHLORIDE 0.9 % IV SOLN
150.0000 mg | Freq: Once | INTRAVENOUS | Status: AC
  Administered 2012-02-12: 150 mg via INTRAVENOUS
  Filled 2012-02-12: qty 5

## 2012-02-12 MED ORDER — MANNITOL 25 % IV SOLN
Freq: Once | INTRAVENOUS | Status: AC
  Administered 2012-02-12: 11:00:00 via INTRAVENOUS
  Filled 2012-02-12: qty 10

## 2012-02-12 MED ORDER — SODIUM CHLORIDE 0.9 % IV SOLN
Freq: Once | INTRAVENOUS | Status: AC
  Administered 2012-02-12: 11:00:00 via INTRAVENOUS

## 2012-02-12 MED ORDER — CISPLATIN CHEMO INJECTION 100MG/100ML
75.0000 mg/m2 | Freq: Once | INTRAVENOUS | Status: AC
  Administered 2012-02-12: 166 mg via INTRAVENOUS
  Filled 2012-02-12: qty 166

## 2012-02-12 MED ORDER — SODIUM CHLORIDE 0.9 % IV SOLN
500.0000 mg/m2 | Freq: Once | INTRAVENOUS | Status: AC
  Administered 2012-02-12: 1100 mg via INTRAVENOUS
  Filled 2012-02-12: qty 44

## 2012-02-12 NOTE — Progress Notes (Signed)
[facility] [phone]   Fax:([phone]  OFFICE PROGRESS NOTE  Marga Melnick, MD [phone] [facility] [address] [place] [address]  DIAGNOSIS: Stage IIA (T1a, N1, M0) non-small cell lung cancer consistent with adenocarcinoma with negative EGFR mutation diagnosed in March of 2013.   PRIOR THERAPY: S/P left video-assisted thoracoscopy, wedge resection of left lower lobe nodule, thoracoscopic left lower lobectomy, mediastinal lymph node dissection on 11/02/2011.    CURRENT THERAPY: Adjuvant chemotherapy with cisplatin 75 mg/M2 and Alimta 500 mg/M2 every 3 weeks. She is status post 3 cycles.  INTERVAL HISTORY: Travone Georg 66 y.o. male returns to the clinic today for followup visit accompanied by his wife. He reports some blood pressure elevations after taking the steroids with each cycle of chemotherapy. Other than some fatigue and generalized malaise that resolves his tolerated his adjuvant chemotherapy relatively well. He is been compliant with his blood pressure medications. He voiced no other specific complaints today.. Denied any specific chest pain or shortness of breath no cough or hemoptysis.   MEDICAL HISTORY: Past Medical History  Diagnosis Date  . Hypertension   . Peyronie disease   . Gilbert's syndrome   . Hyperlipidemia   . Amnesia     isolated;for 30 mins w/elevated BP  . Hematuria 2009    Dr Merry Lofty  . Hyperglycemia   . Diverticulosis of colon   . Cellulitis 2011    LUE  . Peptic ulcer 1995  . Recurrent upper respiratory infection (URI)     09/2011  BRONCHITIS  . GERD (gastroesophageal reflux disease)     OCC HEARTBURN  . Neuromuscular disorder     2010 TEMPORARY MEMORY LOSS 1-1.5 HR WAS CHECKED OUT AT [facility] , NOTHING FOUND  . Arthritis   . Cancer     skin  . Lung cancer, lower lobe     Stage IIA (T1,N1)    ALLERGIES:  is allergic to lisinopril.  MEDICATIONS:  Current Outpatient Prescriptions   Medication Sig Dispense Refill  . dexamethasone (DECADRON) 4 MG tablet Take 4 mg by mouth 2 (two) times daily with a meal. take 2 the day before , 2 the day of and 2 the day after chemo      . diltiazem (CARDIZEM CD) 300 MG 24 hr capsule Take 1 capsule (300 mg total) by mouth daily.  30 capsule  3  . folic acid (FOLVITE) 1 MG tablet Take 1 mg by mouth daily.      [name] Kitchen losartan (COZAAR) 100 MG tablet Take 1 tablet (100 mg total) by mouth daily.  90 tablet  3  . prochlorperazine (COMPAZINE) 10 MG tablet Take 10 mg by mouth Every 6 hours as needed. nausea       No current facility-administered medications for this visit.   Facility-Administered Medications Ordered in Other Visits  Medication Dose Route Frequency Provider Last Rate Last Dose  . 0.9 %  sodium chloride infusion   Intravenous Once [employer], PA      . dextrose 5 % and 0.45% NaCl 1,000 mL with potassium chloride 20 mEq, magnesium sulfate 12 mEq, mannitol 12.5 g infusion   Intravenous Once Conni Slipper, PA        SURGICAL HISTORY:  Past Surgical History  Procedure Date  . Colonoscopy w/ polypectomy 2005    due 2015  . Vasectomy   . Cystoscopy 2009  . Vein surgery     R ankle  .  No past surgeries     BIL LASER EYE SURGERY  . Lobectomy 11/02/2011    Procedure: LOBECTOMY;  Surgeon: Loreli Slot, MD;  Location: [facility] OR;  Service: Thoracic;  Laterality: Left;  LEFT LOWER LOBECTOMY    REVIEW OF SYSTEMS:  Pertinent items are noted in HPI.   PHYSICAL EXAMINATION: General appearance: alert, cooperative and no distress Neck: no adenopathy Lymph nodes: Cervical, supraclavicular, and axillary nodes normal. Resp: clear to auscultation bilaterally Cardio: regular rate and rhythm, S1, S2 normal, no murmur, click, rub or gallop GI: soft, non-tender; bowel sounds normal; no masses,  no organomegaly Extremities: extremities normal, atraumatic, no cyanosis or edema Neurologic: Alert and oriented X 3, normal strength and  tone. Normal symmetric reflexes. Normal coordination and gait   ECOG PERFORMANCE STATUS: 1 - Symptomatic but completely ambulatory  Blood pressure 156/91, pulse 89, temperature 97.6 F (36.4 C), temperature source Oral, height 5\' 10"  (1.778 m), weight 212 lb 14.4 oz (96.571 kg).  LABORATORY DATA: Lab Results  Component Value Date   WBC 8.4 02/12/2012   HGB 11.1* 02/12/2012   HCT 32.1* 02/12/2012   MCV 90.4 02/12/2012   PLT 193 02/12/2012      Chemistry      Component Value Date/Time   NA 140 02/05/2012 0929   K 4.5 02/05/2012 0929   CL 102 02/05/2012 0929   CO2 28 02/05/2012 0929   BUN 12 02/05/2012 0929   CREATININE 1.24 02/05/2012 0929      Component Value Date/Time   CALCIUM 9.1 02/05/2012 0929   ALKPHOS 69 02/05/2012 0929   AST 20 02/05/2012 0929   ALT 36 02/05/2012 0929   BILITOT 0.5 02/05/2012 0929       RADIOGRAPHIC STUDIES: Dg Chest 2 View  11/21/2011  *RADIOLOGY REPORT*  Clinical Data: History of lung surgery.  Some shortness of breath.  CHEST - 2 VIEW  Comparison: 11/06/2011 and multiple previous  Findings: Central line has been removed.  The right chest is clear. Small amount of pleural air previously seen is not longer noted. There is increasing pleural fluid on the left with volume loss and/or pneumonia at the left base.  IMPRESSION: Increasing pleural effusion in the left chest.  Left base atelectasis and/or pneumonia.  Original Report Authenticated By: Thomasenia Sales, M.D.   Ct Head W Wo Contrast  12/04/2011  *RADIOLOGY REPORT*  Clinical Data: 66 year old male with lung cancer, about to start chemotherapy.  Staging.  CT HEAD WITHOUT AND WITH CONTRAST  Technique:  Contiguous axial images were obtained from the base of the skull through the vertex without and with intravenous contrast.  Contrast: OMNIPAQUE IOHEXOL 300 MG/ML  SOLN  Comparison: None.  Findings: Visualized paranasal sinuses and mastoids are clear.  Visualized orbits and scalp soft tissues are within normal limits.  No  acute osseous abnormality identified.  Mild dystrophic basal ganglia calcifications. Cerebral volume is within normal limits for age.  No ventriculomegaly. No midline shift, mass effect, or evidence of mass lesion.  No evidence of cortically based acute infarction identified.  No acute intracranial hemorrhage identified. No abnormal enhancement identified.  IMPRESSION: No acute or metastatic intracranial abnormality.  Original Report Authenticated By: Harley Hallmark, M.D.    ASSESSMENT/PLAN: This is a very pleasant 66 years old white male with a stage II a non-small cell lung cancer status post wedge resection of the left lower lobe with lymph node dissection. He is currently undergoing adjuvant chemotherapy with cisplatin and Alimta status post  3 cycles. The patient was discussed with  Dr. Arbutus Ped. He will proceed with cycle #4 of his adjuvant chemotherapy with cisplatin and Alimta as scheduled today. He'll followup with Dr. Arbutus Ped in 3 weeks with repeat CBC differential and C. met and magnesium as well as a CT of the chest without contrast to reevaluate his disease. He will continue with weekly labs consisting of a CBC differential C. met and magnesium. He'll continue to monitor his blood pressures at home which have been running on average 140/80.   Laural Benes, Liticia Gasior E, PA-C   All questions were answered. The patient knows to call the clinic with any problems, questions or concerns. We can certainly see the patient much sooner if necessary.  I spent 20 minutes counseling the patient face to face. The total time spent in the appointment was 30 minutes.

## 2012-02-12 NOTE — Patient Instructions (Addendum)
 Cancer Center Discharge Instructions for Patients Receiving Chemotherapy  Today you received the following chemotherapy agents Alimta, Cisplatin  To help prevent nausea and vomiting after your treatment, we encourage you to take your nausea medication: Compazine.   Begin taking it at 11 pm/bedtime and take it as often as prescribed for the next 48-72 hours.   If you develop nausea and vomiting that is not controlled by your nausea medication, call the clinic. If it is after clinic hours your family physician or the after hours number for the clinic or go to the Emergency Department.   BELOW ARE SYMPTOMS THAT SHOULD BE REPORTED IMMEDIATELY:  *FEVER GREATER THAN 100.5 F  *CHILLS WITH OR WITHOUT FEVER  NAUSEA AND VOMITING THAT IS NOT CONTROLLED WITH YOUR NAUSEA MEDICATION  *UNUSUAL SHORTNESS OF BREATH  *UNUSUAL BRUISING OR BLEEDING  TENDERNESS IN MOUTH AND THROAT WITH OR WITHOUT PRESENCE OF ULCERS  *URINARY PROBLEMS  *BOWEL PROBLEMS  UNUSUAL RASH Items with * indicate a potential emergency and should be followed up as soon as possible.  Feel free to call the clinic you have any questions or concerns. The clinic phone number is [phone].   I have been informed and understand all the instructions given to me. I know to contact the clinic, my physician, or go to the Emergency Department if any problems should occur. I do not have any questions at this time, but understand that I may call the clinic during office hours   should I have any questions or need assistance in obtaining follow up care.    __________________________________________  _____________  __________ Signature of Patient or Authorized Representative            Date                   Time    __________________________________________ Nurse's Signature

## 2012-02-12 NOTE — Telephone Encounter (Signed)
appts made and printed for pt  °

## 2012-02-20 ENCOUNTER — Other Ambulatory Visit (HOSPITAL_BASED_OUTPATIENT_CLINIC_OR_DEPARTMENT_OTHER): Payer: Medicare HMO | Admitting: Lab

## 2012-02-20 DIAGNOSIS — C3490 Malignant neoplasm of unspecified part of unspecified bronchus or lung: Secondary | ICD-10-CM

## 2012-02-20 LAB — CBC WITH DIFFERENTIAL/PLATELET
BASO%: 0.1 % (ref 0.0–2.0)
LYMPH%: 46.5 % (ref 14.0–49.0)
MCHC: 34.6 g/dL (ref 32.0–36.0)
MCV: 94.4 fL (ref 79.3–98.0)
MONO%: 2.6 % (ref 0.0–14.0)
Platelets: 43 10*3/uL — ABNORMAL LOW (ref 140–400)
RBC: 3.7 10*6/uL — ABNORMAL LOW (ref 4.20–5.82)

## 2012-02-20 LAB — COMPREHENSIVE METABOLIC PANEL
ALT: 74 U/L — ABNORMAL HIGH (ref 0–53)
Alkaline Phosphatase: 74 U/L (ref 39–117)
CO2: 26 mEq/L (ref 19–32)
Sodium: 136 mEq/L (ref 135–145)
Total Bilirubin: 0.9 mg/dL (ref 0.3–1.2)
Total Protein: 6.2 g/dL (ref 6.0–8.3)

## 2012-02-20 LAB — MAGNESIUM: Magnesium: 1.3 mg/dL — ABNORMAL LOW (ref 1.5–2.5)

## 2012-02-20 NOTE — Progress Notes (Signed)
Quick Note:  Call patient with the result and start Mg Oxide 400 mg po tid # 90 ______

## 2012-02-21 ENCOUNTER — Telehealth: Payer: Self-pay | Admitting: Medical Oncology

## 2012-02-21 MED ORDER — MAGNESIUM OXIDE 400 (241.3 MG) MG PO TABS: 400.0000 mg | ORAL_TABLET | Freq: Three times a day (TID) | ORAL | Status: DC

## 2012-02-21 NOTE — Telephone Encounter (Signed)
Message copied by Charma Igo on Thu Feb 21, 2012 11:55 AM ------      Message from: Si Gaul      Created: Wed Feb 20, 2012  5:59 PM       Call patient with the result and start Mg Oxide 400 mg po tid # 90

## 2012-02-21 NOTE — Telephone Encounter (Signed)
Called in rx to pt and pharmacy

## 2012-02-27 ENCOUNTER — Other Ambulatory Visit: Payer: Medicare HMO | Admitting: Lab

## 2012-02-29 ENCOUNTER — Other Ambulatory Visit: Payer: Self-pay | Admitting: *Deleted

## 2012-02-29 ENCOUNTER — Ambulatory Visit (HOSPITAL_COMMUNITY)
Admission: RE | Admit: 2012-02-29 | Discharge: 2012-02-29 | Disposition: A | Discharge status: Home / Self Care | Payer: Medicare HMO | Source: Ambulatory Visit | Attending: Physician Assistant | Admitting: Physician Assistant

## 2012-02-29 ENCOUNTER — Other Ambulatory Visit: Payer: Medicare HMO | Admitting: Lab

## 2012-02-29 DIAGNOSIS — R911 Solitary pulmonary nodule: Secondary | ICD-10-CM | POA: Insufficient documentation

## 2012-02-29 DIAGNOSIS — Z902 Acquired absence of lung [part of]: Secondary | ICD-10-CM | POA: Insufficient documentation

## 2012-02-29 DIAGNOSIS — C3490 Malignant neoplasm of unspecified part of unspecified bronchus or lung: Secondary | ICD-10-CM | POA: Insufficient documentation

## 2012-02-29 DIAGNOSIS — I700 Atherosclerosis of aorta: Secondary | ICD-10-CM | POA: Insufficient documentation

## 2012-02-29 DIAGNOSIS — R110 Nausea: Secondary | ICD-10-CM

## 2012-02-29 DIAGNOSIS — Z79899 Other long term (current) drug therapy: Secondary | ICD-10-CM | POA: Insufficient documentation

## 2012-02-29 LAB — COMPREHENSIVE METABOLIC PANEL
Albumin: 4 g/dL (ref 3.5–5.2)
Alkaline Phosphatase: 74 U/L (ref 39–117)
BUN: 17 mg/dL (ref 6–23)
CO2: 26 mEq/L (ref 19–32)
Calcium: 9.4 mg/dL (ref 8.4–10.5)
Glucose, Bld: 163 mg/dL — ABNORMAL HIGH (ref 70–99)
Potassium: 4.7 mEq/L (ref 3.5–5.3)

## 2012-02-29 LAB — CBC WITH DIFFERENTIAL/PLATELET
EOS%: 0.8 % (ref 0.0–7.0)
Eosinophils Absolute: 0 10*3/uL (ref 0.0–0.5)
MCV: 97.5 fL (ref 79.3–98.0)
MONO%: 9.5 % (ref 0.0–14.0)
NEUT#: 1.6 10*3/uL (ref 1.5–6.5)
RBC: 3.02 10*6/uL — ABNORMAL LOW (ref 4.20–5.82)
RDW: 19.1 % — ABNORMAL HIGH (ref 11.0–14.6)
lymph#: 2.3 10*3/uL (ref 0.9–3.3)

## 2012-02-29 MED ORDER — ONDANSETRON HCL 8 MG PO TABS: 8.0000 mg | ORAL_TABLET | Freq: Three times a day (TID) | ORAL | Status: AC | PRN

## 2012-02-29 NOTE — Telephone Encounter (Signed)
Pt called stating that he has been queezy the past few days, he cannot take compazine because this causes diarrhea.  He also states that in the morning he has been having 1 looks diarrhea stool since 7/30.  Pt also stated that he has stopped taking magnesium that Dr Donnald Garre prescribed because it was too many to take and upset his stomach.  Per Dr Donnald Garre, pt can take 2 tabs of magnesium daily instead of 3, he can take imodium with each loose stool he has.  Will also call in Zofran to help with his queezy stomach.  Pt will be reassessed on 8/6 at f/u visit with [facility].  SLJ

## 2012-03-04 ENCOUNTER — Telehealth: Payer: Self-pay | Admitting: *Deleted

## 2012-03-04 ENCOUNTER — Ambulatory Visit (HOSPITAL_BASED_OUTPATIENT_CLINIC_OR_DEPARTMENT_OTHER): Payer: Medicare HMO | Admitting: Internal Medicine

## 2012-03-04 VITALS — BP 133/80 | HR 118 | Temp 97.5°F | Resp 20 | Ht 70.0 in | Wt 212.5 lb

## 2012-03-04 DIAGNOSIS — C3490 Malignant neoplasm of unspecified part of unspecified bronchus or lung: Secondary | ICD-10-CM

## 2012-03-04 DIAGNOSIS — C3430 Malignant neoplasm of lower lobe, unspecified bronchus or lung: Secondary | ICD-10-CM

## 2012-03-04 NOTE — Telephone Encounter (Signed)
Gave patient appointment for 06-03-2012 scan 06-04-2012 for lab and md appointment printed out calendar and  Gave to the patient

## 2012-03-04 NOTE — Progress Notes (Signed)
[facility] Telephone:([phone]   Fax:([phone]  OFFICE PROGRESS NOTE  Marga Melnick, MD [phone] [facility] [address] [place] [address]  DIAGNOSIS: Stage IIA (T1a, N1, M0) non-small cell lung cancer consistent with adenocarcinoma with negative EGFR mutation diagnosed in March of 2013.   PRIOR THERAPY:  1) S/P left video-assisted thoracoscopy, wedge resection of left lower lobe nodule, thoracoscopic left lower lobectomy, mediastinal lymph node dissection on 11/02/2011.  2) Adjuvant chemotherapy with cisplatin 75 mg/M2 and Alimta 500 mg/M2 every 3 weeks. She is status post 4 cycles.  CURRENT THERAPY: None.  INTERVAL HISTORY: Cullen Lahaie 66 y.o. male returns to the clinic today for followup visit accompanied by his wife. The patient tolerated the last cycle of his systemic chemotherapy with cisplatin and Alimta fairly well with no significant complaints except for mild nausea a few days after his treatment. He denied having any significant fever or chills,  No peripheral neuropathy. He denied having any significant chest pain, shortness breath, cough or hemoptysis. The patient has repeat CT scan of the chest performed recently and he is here for evaluation and discussion of his scan results.  MEDICAL HISTORY: Past Medical History  Diagnosis Date  . Hypertension   . Peyronie disease   . Gilbert's syndrome   . Hyperlipidemia   . Amnesia     isolated;for 30 mins w/elevated BP  . Hematuria 2009    Dr Merry Lofty  . Hyperglycemia   . Diverticulosis of colon   . Cellulitis 2011    LUE  . Peptic ulcer 1995  . Recurrent upper respiratory infection (URI)     09/2011  BRONCHITIS  . GERD (gastroesophageal reflux disease)     OCC HEARTBURN  . Neuromuscular disorder     2010 TEMPORARY MEMORY LOSS 1-1.5 HR WAS CHECKED OUT AT [facility] , NOTHING FOUND  . Arthritis   . Cancer     skin  . Lung cancer, lower lobe     Stage IIA (T1,N1)     ALLERGIES:  is allergic to lisinopril.  MEDICATIONS:  Current Outpatient Prescriptions  Medication Sig Dispense Refill  . dexamethasone (DECADRON) 4 MG tablet Take 4 mg by mouth 2 (two) times daily with a meal. take 2 the day before , 2 the day of and 2 the day after chemo      . diltiazem (CARDIZEM CD) 240 MG 24 hr capsule Take 240 mg by mouth daily.      [name] Kitchen losartan (COZAAR) 100 MG tablet Take 1 tablet (100 mg total) by mouth daily.  90 tablet  3  . magnesium oxide (MAG-OX) 400 MG tablet Take 400 mg by mouth 2 (two) times daily.      . ondansetron (ZOFRAN) 8 MG tablet Take 1 tablet (8 mg total) by mouth every 8 (eight) hours as needed for nausea.  20 tablet  0  . prochlorperazine (COMPAZINE) 10 MG tablet Take 10 mg by mouth Every 6 hours as needed. nausea      . folic acid (FOLVITE) 1 MG tablet Take 1 mg by mouth daily.        SURGICAL HISTORY:  Past Surgical History  Procedure Date  . Colonoscopy w/ polypectomy 2005    due 2015  . Vasectomy   . Cystoscopy 2009  . Vein surgery     R ankle  . No past surgeries     BIL LASER EYE SURGERY  . Lobectomy 11/02/2011  Procedure: LOBECTOMY;  Surgeon: Loreli Slot, MD;  Location: [facility] OR;  Service: Thoracic;  Laterality: Left;  LEFT LOWER LOBECTOMY    REVIEW OF SYSTEMS:  A comprehensive review of systems was negative.   PHYSICAL EXAMINATION: General appearance: alert, cooperative and no distress Head: Normocephalic, without obvious abnormality, atraumatic Neck: no adenopathy Lymph nodes: Cervical, supraclavicular, and axillary nodes normal. Resp: clear to auscultation bilaterally Cardio: regular rate and rhythm, S1, S2 normal, no murmur, click, rub or gallop GI: soft, non-tender; bowel sounds normal; no masses,  no organomegaly Extremities: extremities normal, atraumatic, no cyanosis or edema Neurologic: Alert and oriented X 3, normal strength and tone. Normal symmetric reflexes. Normal coordination and gait  ECOG  PERFORMANCE STATUS: 1 - Symptomatic but completely ambulatory  Blood pressure 133/80, pulse 118, temperature 97.5 F (36.4 C), temperature source Oral, resp. rate 20, height 5\' 10"  (1.778 m), weight 212 lb 8 oz (96.389 kg).  LABORATORY DATA: Lab Results  Component Value Date   WBC 4.4 02/29/2012   HGB 10.1* 02/29/2012   HCT 29.4* 02/29/2012   MCV 97.5 02/29/2012   PLT 155 02/29/2012      Chemistry      Component Value Date/Time   NA 137 02/29/2012 0839   K 4.7 02/29/2012 0839   CL 101 02/29/2012 0839   CO2 26 02/29/2012 0839   BUN 17 02/29/2012 0839   CREATININE 1.43* 02/29/2012 0839      Component Value Date/Time   CALCIUM 9.4 02/29/2012 0839   ALKPHOS 74 02/29/2012 0839   AST 21 02/29/2012 0839   ALT 28 02/29/2012 0839   BILITOT 0.6 02/29/2012 0839       RADIOGRAPHIC STUDIES: Ct Chest Wo Contrast  02/29/2012  *RADIOLOGY REPORT*  Clinical Data: Lung cancer diagnosed 09/2011, status post left lower lobectomy, chemotherapy ongoing  CT CHEST WITHOUT CONTRAST  Technique:  Multidetector CT imaging of the chest was performed following the standard protocol without IV contrast.  Comparison: 10/04/2011  Findings: Postsurgical changes related to prior left lower lobectomy.  Associated volume loss with small left pleural effusion/thickening.  5 mm right upper lobe nodule (series 5/image 26).  Prior 6 mm nodule along the left fissure is no longer visualized and is likely surgically absent. No pneumothorax.  Visualized thyroid is unremarkable.  Heart is normal in size.  No pericardial effusion.  Leftward mediastinal shift.  Mild atherosclerotic calcifications of the aortic arch.  Small mediastinal and bilateral axillary lymph nodes which do not meet pathologic CT size criteria.  The previously borderline enlarged high right paratracheal node measures 4 mm short axis (series 2/image 10), previously 10 mm.  A precarinal node measures 5 mm short axis (series 2/image 25), unchanged.  Small bilateral axillary nodes measuring up  to 7 mm short axis.  Visualized upper abdomen is notable for multiple hypodense hepatic lesions, unchanged, the largest a 2.2 cm cyst in the left hepatic lobe (series 2/image 48).  Bilateral adrenal glands are within normal limits.  Degenerative changes of the visualized thoracolumbar spine.  IMPRESSION: Postsurgical changes related to the left lower lobectomy.  Prior borderline enlarged high right paratracheal node now measures 4 mm.  Additional mediastinal/bilateral axillary nodes do not meet pathologic CT size criteria.  No evidence of new/progressive metastatic disease in the chest.  Stable 5 mm right upper lobe nodule.  Continued attention on follow- up suggested.  Original Report Authenticated By: Charline Bills, M.D.    ASSESSMENT: This is a very pleasant 66 years old white male with  history of stage IIIa non-small cell lung cancer, adenocarcinoma with negative EGFR mutation status post left lower lobectomy with lymph node dissection followed by 4 cycles of adjuvant chemotherapy with cisplatin and Alimta. The patient has no evidence for disease recurrence on the recent scan.  PLAN: I discussed the scan results with the patient and his wife. I recommended for him continuous observation for now with repeat CT scan of the chest with contrast in 3 months. He was advised to call me immediately she has any concerning symptoms in the interval.  All questions were answered. The patient knows to call the clinic with any problems, questions or concerns. We can certainly see the patient much sooner if necessary.  I spent 15 minutes counseling the patient face to face. The total time spent in the appointment was 25 minutes.

## 2012-04-28 ENCOUNTER — Other Ambulatory Visit: Payer: Self-pay | Admitting: Thoracic Surgery (Cardiothoracic Vascular Surgery)

## 2012-04-28 DIAGNOSIS — D381 Neoplasm of uncertain behavior of trachea, bronchus and lung: Secondary | ICD-10-CM

## 2012-04-29 ENCOUNTER — Ambulatory Visit
Admission: RE | Admit: 2012-04-29 | Discharge: 2012-04-29 | Disposition: A | Discharge status: Home / Self Care | Payer: Medicare HMO | Source: Ambulatory Visit | Attending: Thoracic Surgery (Cardiothoracic Vascular Surgery) | Admitting: Thoracic Surgery (Cardiothoracic Vascular Surgery)

## 2012-04-29 ENCOUNTER — Ambulatory Visit (INDEPENDENT_AMBULATORY_CARE_PROVIDER_SITE_OTHER): Payer: Medicare HMO | Admitting: Thoracic Surgery (Cardiothoracic Vascular Surgery)

## 2012-04-29 ENCOUNTER — Encounter: Payer: Self-pay | Admitting: Thoracic Surgery (Cardiothoracic Vascular Surgery)

## 2012-04-29 VITALS — BP 158/84 | HR 78 | Resp 18 | Ht 70.5 in | Wt 224.0 lb

## 2012-04-29 DIAGNOSIS — D381 Neoplasm of uncertain behavior of trachea, bronchus and lung: Secondary | ICD-10-CM

## 2012-04-29 DIAGNOSIS — Z85118 Personal history of other malignant neoplasm of bronchus and lung: Secondary | ICD-10-CM

## 2012-04-29 DIAGNOSIS — Z09 Encounter for follow-up examination after completed treatment for conditions other than malignant neoplasm: Secondary | ICD-10-CM

## 2012-04-29 NOTE — Progress Notes (Signed)
  HPI:  [name] returns for a scheduled 6 month followup visit. He had a left lower lobectomy for a stage IIA (T1 A., N1) non-small cell carcinoma in April of this year. He then underwent adjuvant chemotherapy with cisplatin and Alimta by Dr. Arbutus Long. Overall he tolerated the chemotherapy well. He says that he's been active and is playing golf regularly. He does get short winded when he walks up hills or if he walks up the stairs in is home 2 or 3 times in a row. But otherwise he is doing well.  Past Medical History  Diagnosis Date  . Hypertension   . Peyronie disease   . Gilbert's syndrome   . Hyperlipidemia   . Amnesia     isolated;for 30 mins w/elevated BP  . Hematuria 2009    Dr Merry Lofty  . Hyperglycemia   . Diverticulosis of colon   . Cellulitis 2011    LUE  . Peptic ulcer 1995  . Recurrent upper respiratory infection (URI)     09/2011  BRONCHITIS  . GERD (gastroesophageal reflux disease)     OCC HEARTBURN  . Neuromuscular disorder     2010 TEMPORARY MEMORY LOSS 1-1.5 HR WAS CHECKED OUT AT [facility] , NOTHING FOUND  . Arthritis   . Cancer     skin  . Lung cancer, lower lobe     Stage IIA (T1,N1)     Current Outpatient Prescriptions  Medication Sig Dispense Refill  . diltiazem (CARDIZEM CD) 240 MG 24 hr capsule Take 240 mg by mouth daily.      [name] Kitchen losartan (COZAAR) 100 MG tablet Take 100 mg by mouth daily.        Physical Exam BP 158/84  Pulse 78  Resp 18  Ht 5' 10.5" (1.791 m)  Wt 224 lb (101.606 kg)  BMI 31.69 kg/m2  SpO2 98% Gen. well-developed well-nourished 66 year old male in no acute distress Neurologic alert and oriented x3 with no deficits Lungs diminished breath sounds at left base otherwise clear No cervical, supraclavicular, or epitrochlear adenopathy Cardiac regular rate and rhythm normal S1 and S2  Diagnostic Tests: Chest x-ray PA and lateral 04/29/2012 Shows postoperative changes on left, otherwise unremarkable, no evidence of  recurrent disease  Impression: 66 year old gentleman now 6 months out from left lower lobectomy for stage II a non-small cell carcinoma. He was treated with adjuvant chemotherapy. He is doing well at this point in time with no evidence recurrent disease. He has a CT scan scheduled through Dr. Arbutus Long for next month.   Plan:  I will plan to see him back in 3 months with a PA and lateral chest x-ray.

## 2012-04-29 NOTE — Patient Instructions (Signed)
Return in 3 months with a chest x-ray

## 2012-06-03 ENCOUNTER — Ambulatory Visit (HOSPITAL_COMMUNITY)
Admission: RE | Admit: 2012-06-03 | Discharge: 2012-06-03 | Disposition: A | Discharge status: Home / Self Care | Payer: Medicare HMO | Source: Ambulatory Visit | Attending: Internal Medicine | Admitting: Internal Medicine

## 2012-06-03 DIAGNOSIS — Z9221 Personal history of antineoplastic chemotherapy: Secondary | ICD-10-CM | POA: Insufficient documentation

## 2012-06-03 DIAGNOSIS — Z902 Acquired absence of lung [part of]: Secondary | ICD-10-CM | POA: Insufficient documentation

## 2012-06-03 DIAGNOSIS — C3490 Malignant neoplasm of unspecified part of unspecified bronchus or lung: Secondary | ICD-10-CM | POA: Insufficient documentation

## 2012-06-03 DIAGNOSIS — K5710 Diverticulosis of small intestine without perforation or abscess without bleeding: Secondary | ICD-10-CM | POA: Insufficient documentation

## 2012-06-04 ENCOUNTER — Other Ambulatory Visit: Payer: Self-pay | Admitting: Internal Medicine

## 2012-06-04 ENCOUNTER — Other Ambulatory Visit (HOSPITAL_BASED_OUTPATIENT_CLINIC_OR_DEPARTMENT_OTHER): Payer: Medicare HMO

## 2012-06-04 ENCOUNTER — Telehealth: Payer: Self-pay | Admitting: Internal Medicine

## 2012-06-04 ENCOUNTER — Ambulatory Visit (HOSPITAL_BASED_OUTPATIENT_CLINIC_OR_DEPARTMENT_OTHER): Payer: Medicare HMO | Admitting: Internal Medicine

## 2012-06-04 VITALS — BP 168/90 | HR 89 | Temp 98.5°F | Resp 20 | Ht 70.5 in | Wt 228.8 lb

## 2012-06-04 DIAGNOSIS — C3430 Malignant neoplasm of lower lobe, unspecified bronchus or lung: Secondary | ICD-10-CM

## 2012-06-04 DIAGNOSIS — C3490 Malignant neoplasm of unspecified part of unspecified bronchus or lung: Secondary | ICD-10-CM

## 2012-06-04 LAB — CBC WITH DIFFERENTIAL/PLATELET
BASO%: 0.6 % (ref 0.0–2.0)
EOS%: 1.6 % (ref 0.0–7.0)
MCH: 34.1 pg — ABNORMAL HIGH (ref 27.2–33.4)
MCHC: 35.6 g/dL (ref 32.0–36.0)
MONO#: 0.5 10*3/uL (ref 0.1–0.9)
RBC: 3.85 10*6/uL — ABNORMAL LOW (ref 4.20–5.82)
WBC: 8.5 10*3/uL (ref 4.0–10.3)
lymph#: 3.9 10*3/uL — ABNORMAL HIGH (ref 0.9–3.3)

## 2012-06-04 LAB — COMPREHENSIVE METABOLIC PANEL (CC13)
ALT: 31 U/L (ref 0–55)
AST: 26 U/L (ref 5–34)
CO2: 25 mEq/L (ref 22–29)
Calcium: 10 mg/dL (ref 8.4–10.4)
Chloride: 105 mEq/L (ref 98–107)
Sodium: 138 mEq/L (ref 136–145)
Total Bilirubin: 0.61 mg/dL (ref 0.20–1.20)
Total Protein: 7.4 g/dL (ref 6.4–8.3)

## 2012-06-04 NOTE — Patient Instructions (Signed)
CT scan of the chest showed no evidence for disease recurrence. Followup in 3 months with repeat CT scan

## 2012-06-04 NOTE — Telephone Encounter (Signed)
Gave pt appt for February 2014 CT, lab and MD

## 2012-06-04 NOTE — Progress Notes (Signed)
[facility] Telephone:(336) [phone]   Fax:(336) [phone]  OFFICE PROGRESS NOTE  Marga Melnick, MD [phone] [facility] [address] [place] [address]  DIAGNOSIS: Stage IIA (T1a, N1, M0) non-small cell lung cancer consistent with adenocarcinoma with negative EGFR mutation diagnosed in March of 2013.   PRIOR THERAPY:  1) S/P left video-assisted thoracoscopy, wedge resection of left lower lobe nodule, thoracoscopic left lower lobectomy, mediastinal lymph node dissection on 11/02/2011.  2) Adjuvant chemotherapy with cisplatin 75 mg/M2 and Alimta 500 mg/M2 every 3 weeks. She is status post 4 cycles.   CURRENT THERAPY: None.  INTERVAL HISTORY: Kyle Long 66 y.o. male returns to the clinic today for routine three-month followup visit accompanied his wife. The patient is feeling fine today with no specific complaints. He denied having any significant weakness and fatigue. He denied having any chest pain, shortness breath, cough or hemoptysis. He has no significant weight loss or night sweats. He has repeat CT scan of the chest performed recently and he is here today for evaluation and discussion of his scan results.  MEDICAL HISTORY: Past Medical History  Diagnosis Date  . Hypertension   . Peyronie disease   . Gilbert's syndrome   . Hyperlipidemia   . Amnesia     isolated;for 30 mins w/elevated BP  . Hematuria 2009    Dr Merry Lofty  . Hyperglycemia   . Diverticulosis of colon   . Cellulitis 2011    LUE  . Peptic ulcer 1995  . Recurrent upper respiratory infection (URI)     09/2011  BRONCHITIS  . GERD (gastroesophageal reflux disease)     OCC HEARTBURN  . Neuromuscular disorder     2010 TEMPORARY MEMORY LOSS 1-1.5 HR WAS CHECKED OUT AT [facility] , NOTHING FOUND  . Arthritis   . Cancer     skin  . Lung cancer, lower lobe     Stage IIA (T1,N1)    ALLERGIES:  is allergic to lisinopril.  MEDICATIONS:  Current Outpatient Prescriptions    Medication Sig Dispense Refill  . diltiazem (CARDIZEM CD) 240 MG 24 hr capsule Take 240 mg by mouth daily.      [name] Kitchen losartan (COZAAR) 100 MG tablet Take 100 mg by mouth daily.        SURGICAL HISTORY:  Past Surgical History  Procedure Date  . Colonoscopy w/ polypectomy 2005    due 2015  . Vasectomy   . Cystoscopy 2009  . Vein surgery     R ankle  . No past surgeries     BIL LASER EYE SURGERY  . Lobectomy 11/02/2011    Procedure: LOBECTOMY;  Surgeon: Loreli Slot, MD;  Location: [facility] OR;  Service: Thoracic;  Laterality: Left;  LEFT LOWER LOBECTOMY    REVIEW OF SYSTEMS:  A comprehensive review of systems was negative.   PHYSICAL EXAMINATION: General appearance: alert, cooperative and no distress Head: Normocephalic, without obvious abnormality, atraumatic Lymph nodes: Cervical, supraclavicular, and axillary nodes normal. Resp: clear to auscultation bilaterally Cardio: regular rate and rhythm, S1, S2 normal, no murmur, click, rub or gallop GI: soft, non-tender; bowel sounds normal; no masses,  no organomegaly Extremities: extremities normal, atraumatic, no cyanosis or edema Neurologic: Alert and oriented X 3, normal strength and tone. Normal symmetric reflexes. Normal coordination and gait  ECOG PERFORMANCE STATUS: 1 - Symptomatic but completely ambulatory  Blood pressure 168/90, pulse 89, temperature 98.5 F (36.9 C), temperature source Oral, resp. rate 20, height  5' 10.5" (1.791 m), weight 228 lb 12.8 oz (103.783 kg).  LABORATORY DATA: Lab Results  Component Value Date   WBC 8.5 06/04/2012   HGB 13.1 06/04/2012   HCT 37.0* 06/04/2012   MCV 95.9 06/04/2012   PLT 153 06/04/2012      Chemistry      Component Value Date/Time   NA 137 02/29/2012 0839   K 4.7 02/29/2012 0839   CL 101 02/29/2012 0839   CO2 26 02/29/2012 0839   BUN 17 02/29/2012 0839   CREATININE 1.43* 02/29/2012 0839      Component Value Date/Time   CALCIUM 9.4 02/29/2012 0839   ALKPHOS 74 02/29/2012 0839   AST 21  02/29/2012 0839   ALT 28 02/29/2012 0839   BILITOT 0.6 02/29/2012 0839       RADIOGRAPHIC STUDIES: Ct Chest Wo Contrast  06/03/2012  *RADIOLOGY REPORT*  Clinical Data: Lung cancer.  Status post left lower lobectomy. Chemotherapy completed.  CT CHEST WITHOUT CONTRAST  Technique:  Multidetector CT imaging of the chest was performed following the standard protocol without IV contrast.  Comparison: Multiple priors, most recently chest c t 02/29/2012.  Findings:  Mediastinum: Heart size is normal. There is no significant pericardial fluid, thickening or pericardial calcification. Multiple reactive size mediastinal lymph nodes are noted, most prominently in the prevascular space.  No definite pathologically enlarged mediastinal or hilar lymph nodes are noted. Please note that accurate exclusion of hilar adenopathy is limited on noncontrast CT scans. There is atherosclerosis of the thoracic aorta, the great vessels of the mediastinum and the coronary arteries, including calcified atherosclerotic plaque in the left main coronary arteries. Esophagus is unremarkable in appearance.  Lungs/Pleura: Postoperative changes of the left lower lobectomy are redemonstrated.  There is a chronic small left-sided pleural effusion which is unchanged and predominantly located in the left base.  Some peripheral reticulation is noted in the left upper lobe which is unchanged and most compatible with mild postoperative scarring.  No suspicious appearing pulmonary nodules or masses are identified on today's examination.  No consolidative airspace disease.  Upper Abdomen: Numerous low-attenuation lesions are again seen scattered throughout the hepatic parenchyma, and are not characterized on today's noncontrast CT examination.  The largest of these is in segment 2 measuring 2.2 cm in diameter (unchanged). Tiny gastric diverticulum is incidentally noted (unchanged).  Musculoskeletal: Postoperative changes of prior left thoracotomy. There are  no aggressive appearing lytic or blastic lesions noted in the visualized portions of the skeleton.  IMPRESSION: 1.  Status post left lower lobectomy without evidence of local recurrence of disease or metastatic disease in the thorax on today's examination.  Small chronic left pleural effusion is unchanged. 2.  Multiple small liver lesions are incompletely characterized on this noncontrast CT examination, but are unchanged in size, number and distribution compared to prior examinations and are therefore favored to be benign. 3. Atherosclerosis, including left main coronary artery disease. Assessment for potential risk factor modification, dietary therapy or pharmacologic therapy may be warranted, if clinically indicated. 4.  Small gastric diverticulum incidentally noted.   Original Report Authenticated By: Trudie Reed, M.D.     ASSESSMENT: This is a very pleasant 66 years old white male with history of stage II a non-small cell lung cancer status post wedge resection of the left lower lobe followed by 4 cycles of adjuvant chemotherapy with cisplatin and Alimta. The patient is doing fine and he has no evidence for disease recurrence.  PLAN: I discussed the scan results with  the patient and his wife. I recommended for him to continue on observation for now with repeat CT scan of the chest with contrast in 3 months. He was advised to call immediately if he has any concerning symptoms in the interval. All questions were answered. The patient knows to call the clinic with any problems, questions or concerns. We can certainly see the patient much sooner if necessary.

## 2012-08-01 ENCOUNTER — Other Ambulatory Visit: Payer: Self-pay | Admitting: *Deleted

## 2012-08-01 DIAGNOSIS — C3490 Malignant neoplasm of unspecified part of unspecified bronchus or lung: Secondary | ICD-10-CM

## 2012-08-05 ENCOUNTER — Ambulatory Visit (HOSPITAL_COMMUNITY)
Admission: RE | Admit: 2012-08-05 | Discharge: 2012-08-05 | Disposition: A | Discharge status: Home / Self Care | Payer: Medicare HMO | Source: Ambulatory Visit | Attending: Thoracic Surgery (Cardiothoracic Vascular Surgery) | Admitting: Thoracic Surgery (Cardiothoracic Vascular Surgery)

## 2012-08-05 ENCOUNTER — Ambulatory Visit (INDEPENDENT_AMBULATORY_CARE_PROVIDER_SITE_OTHER): Payer: Medicare HMO | Admitting: Thoracic Surgery (Cardiothoracic Vascular Surgery)

## 2012-08-05 ENCOUNTER — Encounter: Payer: Self-pay | Admitting: Thoracic Surgery (Cardiothoracic Vascular Surgery)

## 2012-08-05 ENCOUNTER — Other Ambulatory Visit: Payer: Self-pay | Admitting: *Deleted

## 2012-08-05 VITALS — BP 178/114 | HR 100 | Resp 16 | Ht 71.0 in | Wt 225.0 lb

## 2012-08-05 DIAGNOSIS — C3490 Malignant neoplasm of unspecified part of unspecified bronchus or lung: Secondary | ICD-10-CM

## 2012-08-05 DIAGNOSIS — Z85118 Personal history of other malignant neoplasm of bronchus and lung: Secondary | ICD-10-CM

## 2012-08-05 DIAGNOSIS — Z09 Encounter for follow-up examination after completed treatment for conditions other than malignant neoplasm: Secondary | ICD-10-CM

## 2012-08-05 DIAGNOSIS — J90 Pleural effusion, not elsewhere classified: Secondary | ICD-10-CM | POA: Insufficient documentation

## 2012-08-05 NOTE — Progress Notes (Signed)
HPI:  Kyle Long returns today for a 9 month followup visit. He had a left lower lobectomy for a stage IIA (T1 N1) non-small cell carcinoma in April of 2013. He was treated with adjuvant chemotherapy postoperatively, he completed that in August. He was last seen in the office in October at which time he was doing well. He says that he has been feeling well. He's been walking 35 minutes on the treadmill at 3 miles an hour at least 3 times a week. He has occasional pains related to his incision. He says that he will get short of breath with heavy exertion.  Past Medical History  Diagnosis Date  . Hypertension   . Peyronie disease   . Gilbert's syndrome   . Hyperlipidemia   . Amnesia     isolated;for 30 mins w/elevated BP  . Hematuria 2009    Dr Merry Lofty  . Hyperglycemia   . Diverticulosis of colon   . Cellulitis 2011    LUE  . Peptic ulcer 1995  . Recurrent upper respiratory infection (URI)     09/2011  BRONCHITIS  . GERD (gastroesophageal reflux disease)     OCC HEARTBURN  . Neuromuscular disorder     2010 TEMPORARY MEMORY LOSS 1-1.5 HR WAS CHECKED OUT AT [facility] , NOTHING FOUND  . Arthritis   . Cancer     skin  . Lung cancer, lower lobe     Stage IIA (T1,N1)     Current Outpatient Prescriptions  Medication Sig Dispense Refill  . diltiazem (CARDIZEM CD) 240 MG 24 hr capsule Take 240 mg by mouth daily.      [name] Kitchen losartan (COZAAR) 100 MG tablet Take 100 mg by mouth daily.        Physical Exam BP 178/114  Pulse 100  Resp 16  Ht 5\' 11"  (1.803 m)  Wt 225 lb (102.059 kg)  BMI 31.38 kg/m2  SpO38 61% 67 year old male in no acute distress General well-developed and well-nourished HEENT unremarkable Neck no cervical or supraclavicular adenopathy Epitrochlear adenopathy Lungs clear with equal breath sounds bilaterally Cardiac regular rate and rhythm normal S1 and S2  Diagnostic Tests: Chest x-ray 08/05/2012 *RADIOLOGY REPORT*  Clinical Data: Lung cancer. Prior  left lower lobectomy.  CHEST - 2 VIEW  Comparison: 06/03/2012  Findings: Postoperative findings noted in the left chest, with  blunting left lateral costophrenic angle favoring left pleural  effusion. Slight nodularity in the left midlung is probably  vascular.  Atherosclerotic calcification in the aortic arch noted. Right lung  appears clear. Thoracic spondylosis noted.  IMPRESSION:  1. Progressive increase and small left pleural effusion.  2. Atherosclerosis.  3. Mild left mid lung nodularity is probably due to vasculature,  although recurrent malignancy cannot be excluded.   CT of chest 06/03/12 CT CHEST WITHOUT CONTRAST  Technique: Multidetector CT imaging of the chest was performed  following the standard protocol without IV contrast.  Comparison: Multiple priors, most recently chest c t 02/29/2012.  Findings:  Mediastinum: Heart size is normal. There is no significant  pericardial fluid, thickening or pericardial calcification.  Multiple reactive size mediastinal lymph nodes are noted, most  prominently in the prevascular space. No definite pathologically  enlarged mediastinal or hilar lymph nodes are noted. Please note  that accurate exclusion of hilar adenopathy is limited on  noncontrast CT scans. There is atherosclerosis of the thoracic  aorta, the great vessels of the mediastinum and the coronary  arteries, including calcified atherosclerotic plaque in the left  main coronary arteries. Esophagus is unremarkable in appearance.  Lungs/Pleura: Postoperative changes of the left lower lobectomy are  redemonstrated. There is a chronic small left-sided pleural  effusion which is unchanged and predominantly located in the left  base. Some peripheral reticulation is noted in the left upper lobe  which is unchanged and most compatible with mild postoperative  scarring. No suspicious appearing pulmonary nodules or masses are  identified on today's examination. No consolidative  airspace  disease.  Upper Abdomen: Numerous low-attenuation lesions are again seen  scattered throughout the hepatic parenchyma, and are not  characterized on today's noncontrast CT examination. The largest  of these is in segment 2 measuring 2.2 cm in diameter (unchanged).  Tiny gastric diverticulum is incidentally noted (unchanged).  Musculoskeletal: Postoperative changes of prior left thoracotomy.  There are no aggressive appearing lytic or blastic lesions noted in  the visualized portions of the skeleton.  IMPRESSION:  1. Status post left lower lobectomy without evidence of local  recurrence of disease or metastatic disease in the thorax on  today's examination. Small chronic left pleural effusion is  unchanged.  2. Multiple small liver lesions are incompletely characterized on  this noncontrast CT examination, but are unchanged in size, number  and distribution compared to prior examinations and are therefore  favored to be benign.  3. Atherosclerosis, including left main coronary artery disease.  Assessment for potential risk factor modification, dietary therapy  or pharmacologic therapy may be warranted, if clinically indicated.  4. Small gastric diverticulum incidentally noted.   Impression: Kyle Long is a 68 year old gentleman now 9 months out from left lower lobectomy for a stage IIA non-small cell carcinoma. He is doing well, with no evidence recurrent disease.  I reviewed the chest x-ray compared to his CT in November. The finding in the left upper lobe is consistent with a ring of blood vessels that was present on the CT scan. The effusion does not appear to be changed either. He has a CT scan scheduled in early February with Dr. Arbutus Ped. Kyle Long had multiple questions related to the chest x-ray and the report, as well as the previous CT.  Plan: CT in February with Dr. Arbutus Ped  I will plan to see Kyle Long back in April for his one-year followup visit. Since  he will be having CTs in February and May we will do a plain chest x-ray at that time.

## 2012-08-06 IMAGING — CT CT HEAD WO/W CM
1 of 2 series · 13 of 30 positions shown, 17 images · IV contrast ([ID] [id number])
Comparison: None.

CLINICAL DATA: 65-year-old male with lung cancer, about to start
chemotherapy.  Staging.

CT HEAD WITHOUT AND WITH CONTRAST
TECHNIQUE: Contiguous axial images were obtained from the base of
the skull through the vertex without and with intravenous contrast.
Contrast: 100mL OMNIPAQUE IOHEXOL 300 MG/ML  SOLN

[Series 2: head w/o 4.8 h45s · axial · non-contrast · 0.43mm/px · z∈[+1388,+1530]mm · 13 of 36 slices shown, 17 images]
[im 3/36  brain]
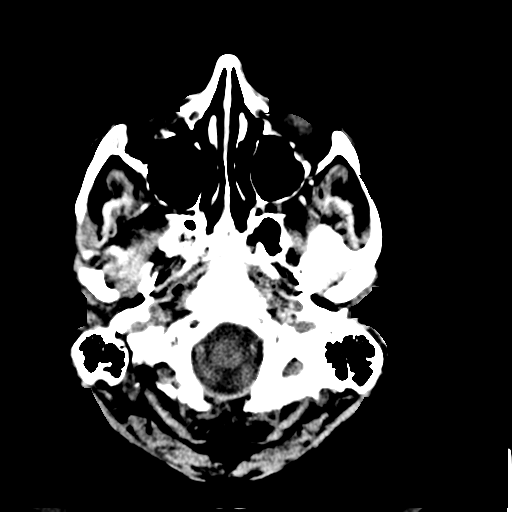
[im 3/36  bone]
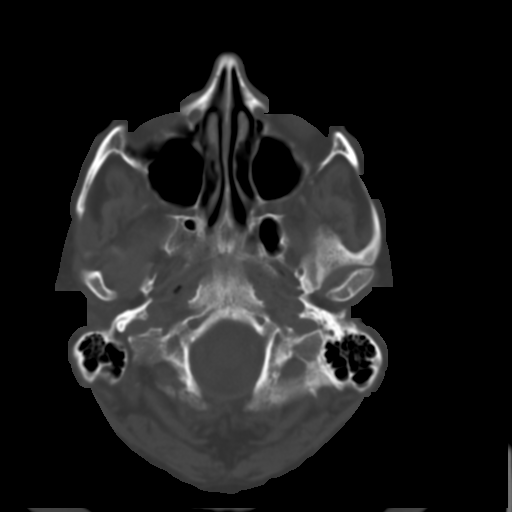
[im 6/36  brain]
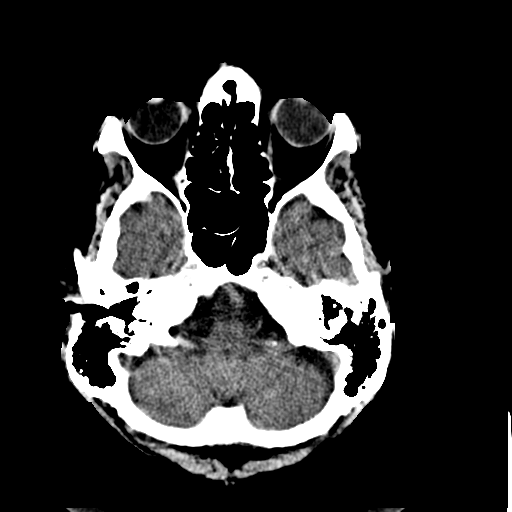
[im 8/36  brain]
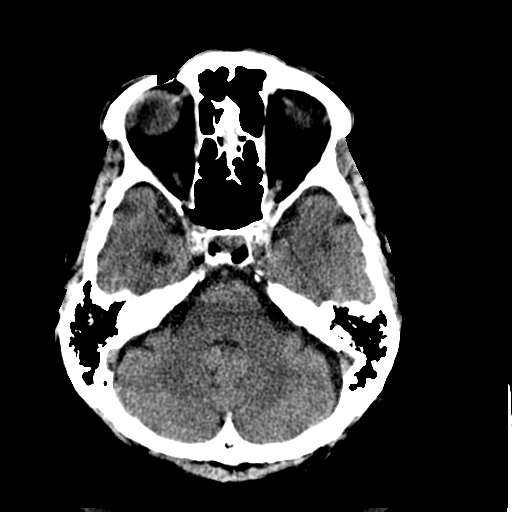
[im 11/36  brain]
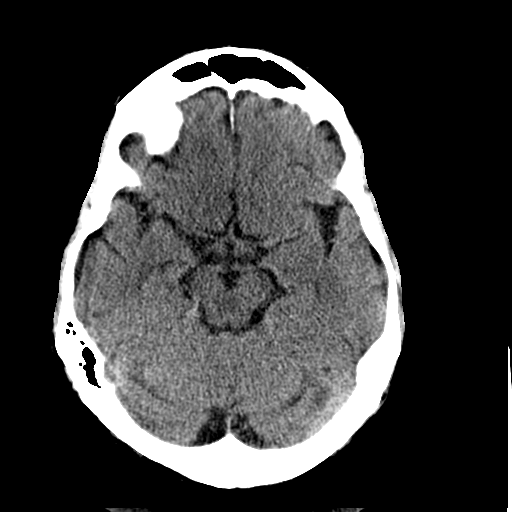
[im 13/36  brain]
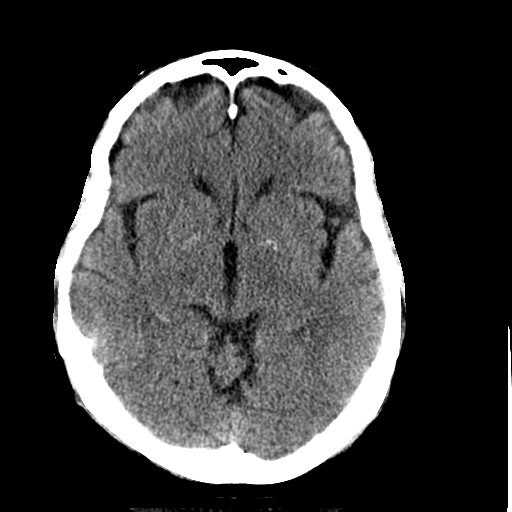
[im 13/36  bone]
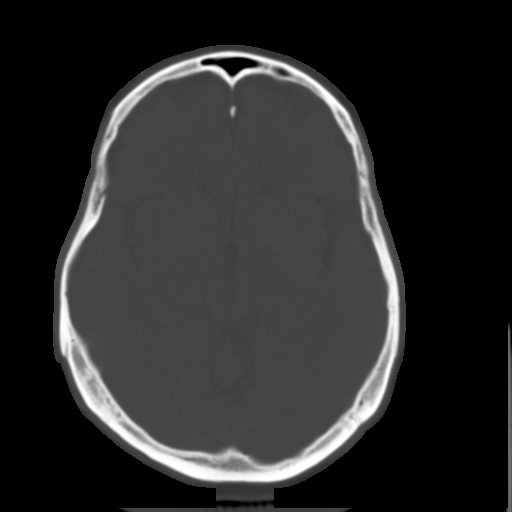
[im 16/36  brain]
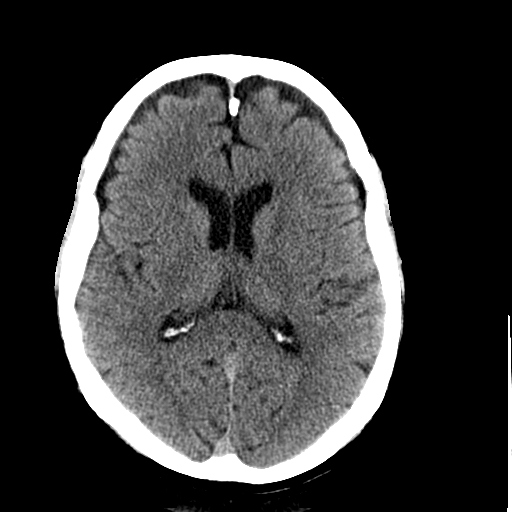
[im 18/36  brain]
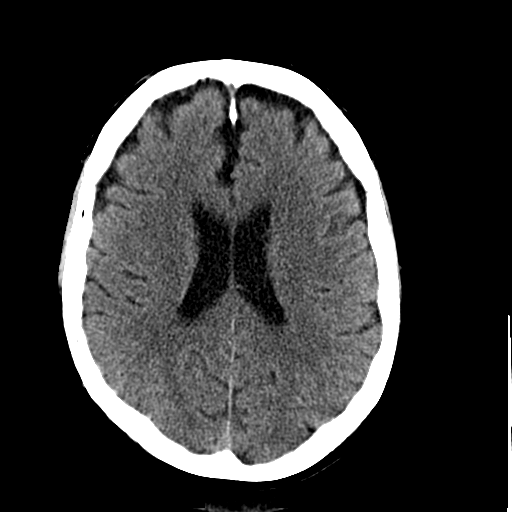
[im 21/36  brain]
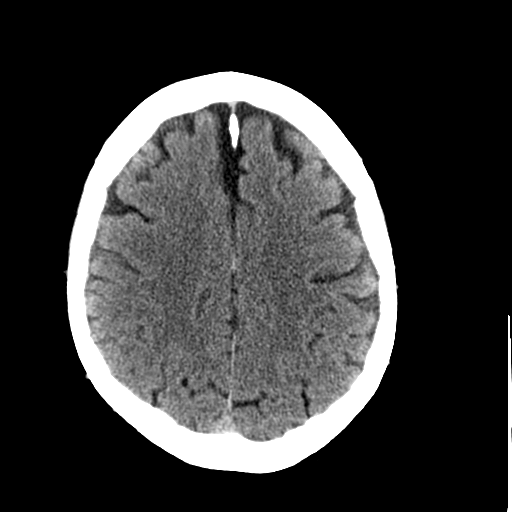
[im 23/36  brain]
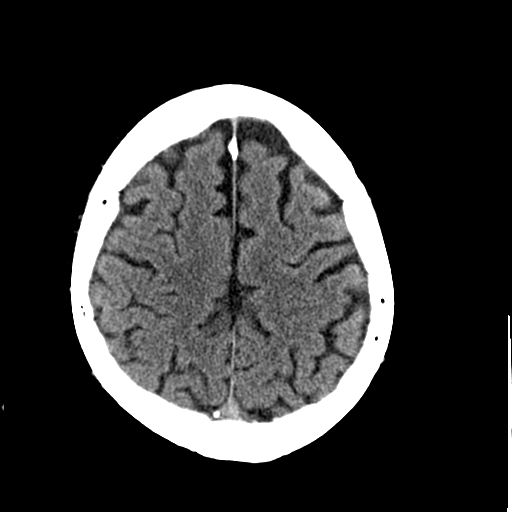
[im 23/36  bone]
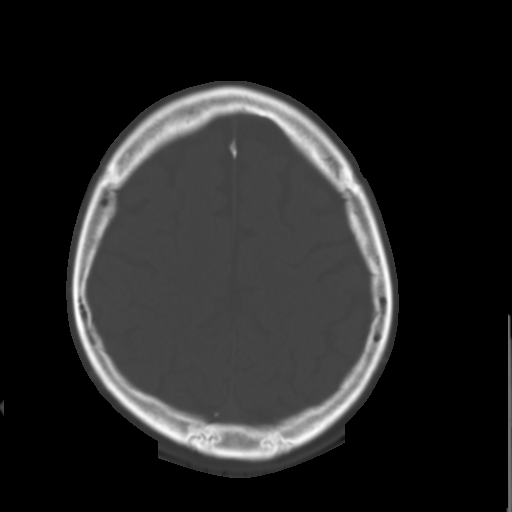
[im 26/36  brain]
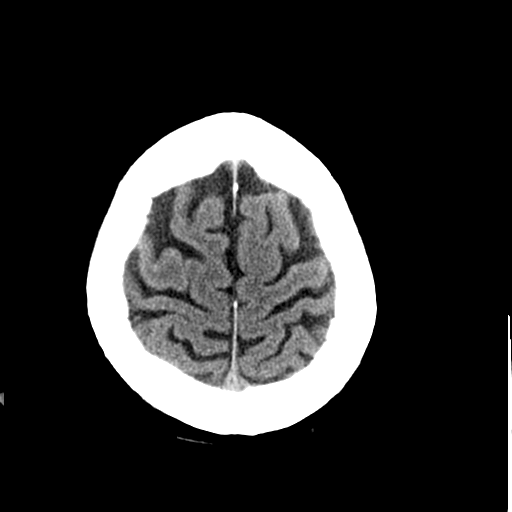
[im 28/36  brain]
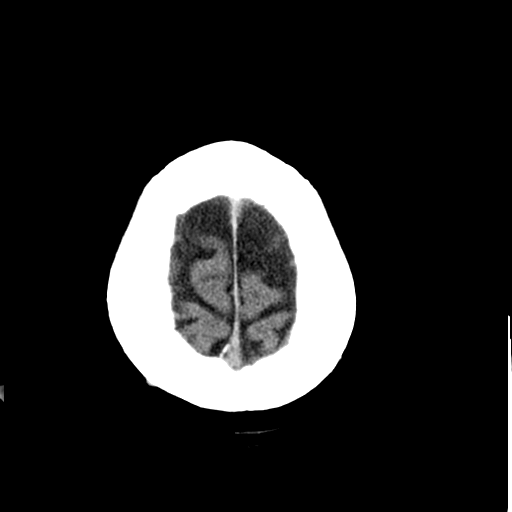
[im 31/36  brain]
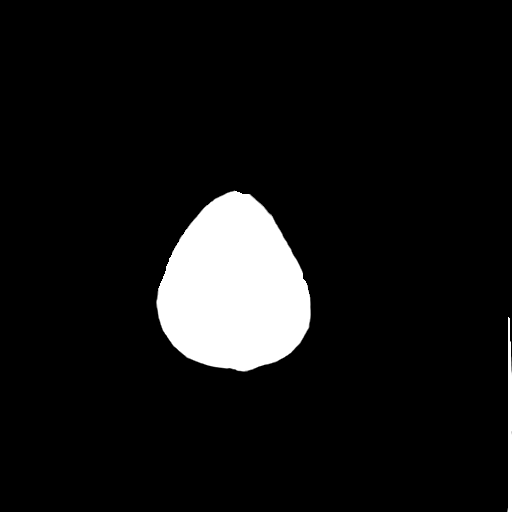
[im 33/36  brain]
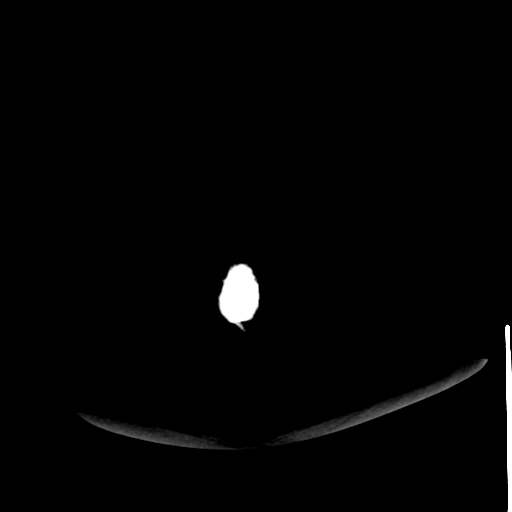
[im 33/36  bone]
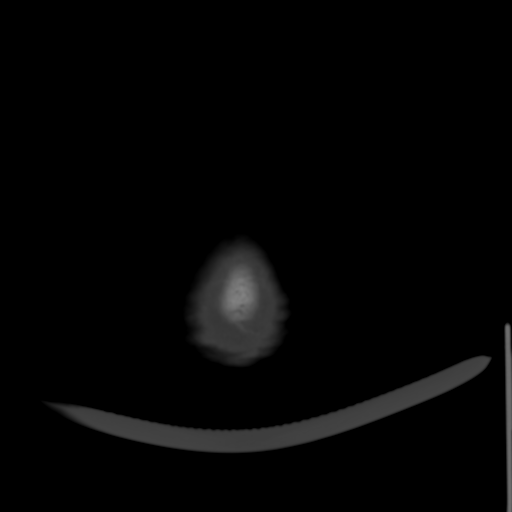

[13 of 30 positions shown; findings below may reference images not displayed]

FINDINGS: Visualized paranasal sinuses and mastoids are clear.  Visualized
orbits and scalp soft tissues are within normal limits.  No acute
osseous abnormality identified.

Mild dystrophic basal ganglia calcifications. Cerebral volume is
within normal limits for age.  No ventriculomegaly. No midline
shift, mass effect, or evidence of mass lesion.  No evidence of
cortically based acute infarction identified.  No acute
intracranial hemorrhage identified.
No abnormal enhancement identified.
IMPRESSION: No acute or metastatic intracranial abnormality.

## 2012-08-26 ENCOUNTER — Telehealth: Payer: Self-pay | Admitting: Internal Medicine

## 2012-08-26 NOTE — Telephone Encounter (Signed)
called pt to cancel appt ber site mgr instructions at 9pm--pt wants to be worked in for a CPE soon. Please review the schedule and advise what works best [phone] Pt was complaining that he had to wait 33-months for this appointment and we are now canceling it.

## 2012-08-27 ENCOUNTER — Encounter: Payer: Medicare HMO | Admitting: Internal Medicine

## 2012-08-27 NOTE — Telephone Encounter (Signed)
Pt could not come on this day -- due to another appt -- put him in at 130pm 2.6.14

## 2012-08-27 NOTE — Telephone Encounter (Signed)
Noted  

## 2012-08-27 NOTE — Telephone Encounter (Signed)
CPX slot open on 09/02/12 at 8:30

## 2012-08-28 ENCOUNTER — Telehealth: Payer: Self-pay | Admitting: Medical Oncology

## 2012-08-28 NOTE — Telephone Encounter (Signed)
I notified Lilyan Punt to please investigate in network providers for radiology

## 2012-08-28 NOTE — Telephone Encounter (Signed)
I told pt to call his insurance for list of in network providers and radiologist and call me back and I will forward order .

## 2012-09-02 ENCOUNTER — Ambulatory Visit (HOSPITAL_COMMUNITY)
Admission: RE | Admit: 2012-09-02 | Discharge: 2012-09-02 | Disposition: A | Discharge status: Home / Self Care | Payer: Medicare HMO | Source: Ambulatory Visit | Attending: Internal Medicine | Admitting: Internal Medicine

## 2012-09-02 DIAGNOSIS — K314 Gastric diverticulum: Secondary | ICD-10-CM | POA: Insufficient documentation

## 2012-09-02 DIAGNOSIS — K5730 Diverticulosis of large intestine without perforation or abscess without bleeding: Secondary | ICD-10-CM | POA: Insufficient documentation

## 2012-09-02 DIAGNOSIS — C3490 Malignant neoplasm of unspecified part of unspecified bronchus or lung: Secondary | ICD-10-CM | POA: Insufficient documentation

## 2012-09-02 DIAGNOSIS — K769 Liver disease, unspecified: Secondary | ICD-10-CM | POA: Insufficient documentation

## 2012-09-02 DIAGNOSIS — R911 Solitary pulmonary nodule: Secondary | ICD-10-CM | POA: Insufficient documentation

## 2012-09-03 ENCOUNTER — Telehealth: Payer: Self-pay | Admitting: Internal Medicine

## 2012-09-03 ENCOUNTER — Other Ambulatory Visit: Payer: Self-pay | Admitting: *Deleted

## 2012-09-03 ENCOUNTER — Encounter: Payer: Self-pay | Admitting: Internal Medicine

## 2012-09-03 ENCOUNTER — Other Ambulatory Visit (HOSPITAL_COMMUNITY): Payer: Medicare HMO

## 2012-09-03 ENCOUNTER — Ambulatory Visit (HOSPITAL_COMMUNITY): Payer: Medicare HMO

## 2012-09-03 ENCOUNTER — Ambulatory Visit (HOSPITAL_BASED_OUTPATIENT_CLINIC_OR_DEPARTMENT_OTHER): Payer: Medicare HMO | Admitting: Internal Medicine

## 2012-09-03 ENCOUNTER — Other Ambulatory Visit (HOSPITAL_BASED_OUTPATIENT_CLINIC_OR_DEPARTMENT_OTHER): Payer: Medicare HMO | Admitting: Lab

## 2012-09-03 VITALS — BP 148/85 | HR 96 | Temp 98.2°F | Ht 71.0 in | Wt 227.1 lb

## 2012-09-03 DIAGNOSIS — C3430 Malignant neoplasm of lower lobe, unspecified bronchus or lung: Secondary | ICD-10-CM

## 2012-09-03 DIAGNOSIS — C3490 Malignant neoplasm of unspecified part of unspecified bronchus or lung: Secondary | ICD-10-CM

## 2012-09-03 LAB — CBC WITH DIFFERENTIAL/PLATELET
BASO%: 0.6 % (ref 0.0–2.0)
Basophils Absolute: 0.1 10*3/uL (ref 0.0–0.1)
EOS%: 1.5 % (ref 0.0–7.0)
HGB: 13.3 g/dL (ref 13.0–17.1)
MCH: 32.6 pg (ref 27.2–33.4)
MCHC: 34.9 g/dL (ref 32.0–36.0)
MCV: 93.4 fL (ref 79.3–98.0)
MONO%: 6 % (ref 0.0–14.0)
NEUT%: 54.3 % (ref 39.0–75.0)
RDW: 13.3 % (ref 11.0–14.6)
lymph#: 3.3 10*3/uL (ref 0.9–3.3)

## 2012-09-03 LAB — COMPREHENSIVE METABOLIC PANEL (CC13)
ALT: 35 U/L (ref 0–55)
AST: 31 U/L (ref 5–34)
Alkaline Phosphatase: 79 U/L (ref 40–150)
BUN: 24.8 mg/dL (ref 7.0–26.0)
Creatinine: 1.8 mg/dL — ABNORMAL HIGH (ref 0.7–1.3)

## 2012-09-03 NOTE — Patient Instructions (Signed)
No evidence for disease recurrence on his recent scan. Followup in 6 months with repeat CT scan of the chest 

## 2012-09-03 NOTE — Addendum Note (Signed)
Addended by: Si Gaul on: 09/03/2012 09:47 AM   Modules accepted: Orders

## 2012-09-03 NOTE — Progress Notes (Signed)
[facility] Telephone:([phone]) [phone]   Fax:([phone]) [phone]  OFFICE PROGRESS NOTE  Marga Melnick, MD [phone] [facility] [address] [place] [address]  DIAGNOSIS: Stage IIA (T1a, N1, M0) non-small cell lung cancer consistent with adenocarcinoma with negative EGFR mutation diagnosed in March of 2013.   PRIOR THERAPY:  1) S/P left video-assisted thoracoscopy, wedge resection of left lower lobe nodule, thoracoscopic left lower lobectomy, mediastinal lymph node dissection on 11/02/2011.  2) Adjuvant chemotherapy with cisplatin 75 mg/M2 and Alimta 500 mg/M2 every 3 weeks. She is status post 4 cycles.   CURRENT THERAPY: None.  INTERVAL HISTORY: Gaberiel Youngblood 67 y.o. male returns to the clinic today for followup appointment at 10 by his wife. The patient is feeling fine today with no specific complaints except for mild soreness in the right nipple which is most likely secondary to trauma from his grandkids jumping on his chest. He denied having any significant chest pain, shortness breath, cough or hemoptysis. He denied having any significant weight loss or night sweats. The patient had repeat CT scan of the chest performed recently and he is here for evaluation and discussion of his scan results.  MEDICAL HISTORY: Past Medical History  Diagnosis Date  . Hypertension   . Peyronie disease   . Gilbert's syndrome   . Hyperlipidemia   . Amnesia     isolated;for 30 mins w/elevated BP  . Hematuria 2009    Dr Merry Lofty  . Hyperglycemia   . Diverticulosis of colon   . Cellulitis 2011    LUE  . Peptic ulcer 1995  . Recurrent upper respiratory infection (URI)     09/2011  BRONCHITIS  . GERD (gastroesophageal reflux disease)     OCC HEARTBURN  . Neuromuscular disorder     2010 TEMPORARY MEMORY LOSS 1-1.5 HR WAS CHECKED OUT AT [facility] , NOTHING FOUND  . Arthritis   . Cancer     skin  . Lung cancer, lower lobe     Stage IIA (T1,N1)    ALLERGIES:  is  allergic to lisinopril.  MEDICATIONS:  Current Outpatient Prescriptions  Medication Sig Dispense Refill  . diltiazem (CARDIZEM CD) 240 MG 24 hr capsule Take 240 mg by mouth daily.      [name] Kitchen losartan (COZAAR) 100 MG tablet Take 100 mg by mouth daily.        SURGICAL HISTORY:  Past Surgical History  Procedure Date  . Colonoscopy w/ polypectomy 2005    due 2015  . Vasectomy   . Cystoscopy 2009  . Vein surgery     R ankle  . No past surgeries     BIL LASER EYE SURGERY  . Lobectomy 11/02/2011    Procedure: LOBECTOMY;  Surgeon: Loreli Slot, MD;  Location: [facility] OR;  Service: Thoracic;  Laterality: Left;  LEFT LOWER LOBECTOMY    REVIEW OF SYSTEMS:  A comprehensive review of systems was negative.   PHYSICAL EXAMINATION: General appearance: alert, cooperative and no distress Head: Normocephalic, without obvious abnormality, atraumatic Neck: no adenopathy Lymph nodes: Cervical, supraclavicular, and axillary nodes normal. Resp: clear to auscultation bilaterally Cardio: regular rate and rhythm, S1, S2 normal, no murmur, click, rub or gallop GI: soft, non-tender; bowel sounds normal; no masses,  no organomegaly Extremities: extremities normal, atraumatic, no cyanosis or edema  ECOG PERFORMANCE STATUS: 1 - Symptomatic but completely ambulatory  There were no vitals taken for this visit.  LABORATORY DATA: Lab Results  Component Value  Date   WBC 8.7 09/03/2012   HGB 13.3 09/03/2012   HCT 38.0* 09/03/2012   MCV 93.4 09/03/2012   PLT 162 09/03/2012      Chemistry      Component Value Date/Time   NA 138 06/04/2012 0909   NA 137 02/29/2012 0839   K 4.2 06/04/2012 0909   K 4.7 02/29/2012 0839   CL 105 06/04/2012 0909   CL 101 02/29/2012 0839   CO2 25 06/04/2012 0909   CO2 26 02/29/2012 0839   BUN 24.0 06/04/2012 0909   BUN 17 02/29/2012 0839   CREATININE 1.6* 06/04/2012 0909   CREATININE 1.43* 02/29/2012 0839      Component Value Date/Time   CALCIUM 10.0 06/04/2012 0909   CALCIUM 9.4 02/29/2012 0839     ALKPHOS 75 06/04/2012 0909   ALKPHOS 74 02/29/2012 0839   AST 26 06/04/2012 0909   AST 21 02/29/2012 0839   ALT 31 06/04/2012 0909   ALT 28 02/29/2012 0839   BILITOT 0.61 06/04/2012 0909   BILITOT 0.6 02/29/2012 0839       RADIOGRAPHIC STUDIES: Dg Chest 2 View  08/05/2012  *RADIOLOGY REPORT*  Clinical Data: Lung cancer.  Prior left lower lobectomy.  CHEST - 2 VIEW  Comparison: 06/03/2012  Findings: Postoperative findings noted in the left chest, with blunting left lateral costophrenic angle favoring left pleural effusion.  Slight nodularity in the left midlung is probably vascular.  Atherosclerotic calcification in the aortic arch noted.  Right lung appears clear.  Thoracic spondylosis noted.  IMPRESSION:  1.  Progressive increase and small left pleural effusion. 2.  Atherosclerosis. 3.  Mild left mid lung nodularity is probably due to vasculature, although recurrent malignancy cannot be excluded.   Original Report Authenticated By: Gaylyn Rong, M.D.    Ct Chest Wo Contrast  09/02/2012  *RADIOLOGY REPORT*  Clinical Data: Lung cancer.  Partial left lower lobectomy. Chemotherapy completed.  CT CHEST WITHOUT CONTRAST  Technique:  Multidetector CT imaging of the chest was performed following the standard protocol without IV contrast.  Comparison: Multiple exams, including 06/03/2012  Findings: Left lower lobectomy noted.  No pathologic thoracic adenopathy.  Small left pleural effusion is essentially stable from prior.  No change in scattered hepatic lesions.  Colonic diverticulosis noted.  There is a small posterior diverticulum of the stomach.  Thoracic spondylosis noted.  Nonunited ridging noted between the left sixth and seventh ribs posterolaterally.  2-3 mm nodule in the right upper lobe, image 16 of series 5. 2-3 mm right upper lobe nodule noted on image 23 of series 5 medially, stable.  A 4 mm ground-glass opacity is present in the right upper lobe noted on image 29 of series 5.  IMPRESSION:  1.  Stable small right upper lobe nodules include two nodules in the 2-3 mm range in the right upper lobe (no change from 10/04/2011) and a 4 mm ground-glass opacity in the right upper lobe, also unchanged.  Consider follow-up CT scan in 4-6 months time in order to ensure continued stability. 2.  Postoperative findings in the left chest, with stable small residual left pleural effusion. 3.  Stable hypodense liver lesions, probably cysts or similar benign lesions. 4.  Small gastric diverticulum.  Colonic diverticulosis noted.   Original Report Authenticated By: Gaylyn Rong, M.D.     ASSESSMENT: This is a very pleasant 67 years old white male with history of stage II a non-small cell lung cancer status post left lower lobectomy with lymph node dissection  followed by 4 cycles of adjuvant chemotherapy with cisplatin and Alimta. The patient is currently on observation and he has no evidence for disease recurrence.  PLAN: I discussed the scan results with the patient and his wife. I recommended for him to continue on observation for now with repeat CT scan of the chest in 6 months. He scheduled to see Dr. Dorris Fetch in 3 months. He was advised to call me immediately if he has any concerning symptoms in the interval.  All questions were answered. The patient knows to call the clinic with any problems, questions or concerns. We can certainly see the patient much sooner if necessary.

## 2012-09-03 NOTE — Telephone Encounter (Signed)
gv and printed appt schedule for Aug...pt aware

## 2012-09-04 ENCOUNTER — Encounter: Payer: Self-pay | Admitting: Internal Medicine

## 2012-09-04 ENCOUNTER — Ambulatory Visit (INDEPENDENT_AMBULATORY_CARE_PROVIDER_SITE_OTHER): Payer: Medicare HMO | Admitting: Internal Medicine

## 2012-09-04 VITALS — BP 130/86 | HR 99 | Temp 98.3°F | Resp 12 | Ht 71.0 in | Wt 228.0 lb

## 2012-09-04 DIAGNOSIS — I10 Essential (primary) hypertension: Secondary | ICD-10-CM

## 2012-09-04 DIAGNOSIS — N289 Disorder of kidney and ureter, unspecified: Secondary | ICD-10-CM | POA: Insufficient documentation

## 2012-09-04 DIAGNOSIS — Z23 Encounter for immunization: Secondary | ICD-10-CM

## 2012-09-04 DIAGNOSIS — Z0000 Encounter for general adult medical examination without abnormal findings: Secondary | ICD-10-CM

## 2012-09-04 DIAGNOSIS — E785 Hyperlipidemia, unspecified: Secondary | ICD-10-CM

## 2012-09-04 DIAGNOSIS — C3490 Malignant neoplasm of unspecified part of unspecified bronchus or lung: Secondary | ICD-10-CM

## 2012-09-04 DIAGNOSIS — N189 Chronic kidney disease, unspecified: Secondary | ICD-10-CM | POA: Insufficient documentation

## 2012-09-04 DIAGNOSIS — N429 Disorder of prostate, unspecified: Secondary | ICD-10-CM

## 2012-09-04 DIAGNOSIS — R7309 Other abnormal glucose: Secondary | ICD-10-CM

## 2012-09-04 LAB — LIPID PANEL
HDL: 46.5 mg/dL (ref >39.00)
LDL Cholesterol: 93 mg/dL (ref 0–99)
Total CHOL/HDL Ratio: 3
Triglycerides: 113 mg/dL (ref 0.0–149.0)
VLDL: 22.6 mg/dL (ref 0.0–40.0)

## 2012-09-04 LAB — TSH: TSH: 0.96 u[IU]/mL (ref 0.35–5.50)

## 2012-09-04 MED ORDER — LOSARTAN POTASSIUM 100 MG PO TABS: 100.0000 mg | ORAL_TABLET | Freq: Every day | ORAL | Status: DC

## 2012-09-04 MED ORDER — DILTIAZEM HCL ER COATED BEADS 240 MG PO CP24: 240.0000 mg | ORAL_CAPSULE | Freq: Every day | ORAL | Status: DC

## 2012-09-04 NOTE — Patient Instructions (Addendum)
Minimal Blood Pressure Goal= AVERAGE < 140/90;  Ideal is an AVERAGE < 135/85. This AVERAGE should be calculated from @ least 5-7 BP readings taken @ different times of day on different days of week. You should not respond to isolated BP readings , but rather the AVERAGE for that week .Please bring your  blood pressure cuff to office visits to verify that it is reliable.It  can also be checked against the blood pressure device at the pharmacy. Finger or wrist cuffs are not dependable; an arm cuff is.   BUN, creatinine, and GFR  all assess kidney function. To protect the kidneys it  is important to control your blood pressure and sugar. You should also stay well hydrated. Drink to thirst, up to 32 ounces of fluids per day.  Recheck kidney function in 4-6 weeks(BMET).  If you activate My Chart; the results can be released to you as soon as they populate from the lab. If you choose not to use this program; the labs have to be reviewed, copied & mailed   causing a delay in getting the results to you.

## 2012-09-04 NOTE — Progress Notes (Signed)
Subjective:    Patient ID: Kyle Long, male    DOB: 03-31-1946, 67 y.o.   MRN: [id number]  HPI  Medicare Wellness Visit:  Psychosocial & medical history were reviewed as required by Medicare (abuse,antisocial behavioral risks,firearm risk).  Social history: caffeine: 3 cups coffee/ day , alcohol: 11-12 /week  ,  tobacco use: quit 1981   Exercise :  Gym 35 min on treadmill & exercises No home & personal  safety / fall risk Activities of daily living: no limitations  Seatbelt  and smoke alarm employed. Power of Attorney/Living Will status : in place Ophthalmology exam due Hearing evaluation not current Orientation :oriented X 3  Memory & recall :good Math testing:good Mood & affect : normal . Depression / anxiety: denied Travel history : last 2002 [place]  Immunization status : tetanus given Transfusion history:  none  Preventive health surveillance ( colonoscopy as per protocol/ [facility]): current  Dental care:  current. Chart reviewed &  Updated. Active issues reviewed & addressed.       Review of Systems HYPERTENSION: Disease Monitoring: Blood pressure  average 140/80 Compliant with present antihypertensive regimen   PMH of FASTING HYPERGLYCEMIA : Disease Monitoring: Blood Sugar:  no monitor  Medication Compliance: no diabetic meds Diet: none but avoids HFCS sugar   HYPERLIPIDEMIA: Diet & exercise as noted Medication Compliance:  No statin taken  No FH premature CAD / CVA   Past medical history/family history/social history were all reviewed and updated.    Signs & symptoms not present or negative as noted below: No significant headaches No chest pain, palpitations , claudications, PNDyspnea    No exertional dyspnea No lightheadedness or syncope   No edema No polyuria/phagia/dipsia No limb numbness & tingling or weakness No non healing skin lesions      No blurred , double or loss of vision No abd pain, bowel changes No significant myalgias                 Objective:   Physical Exam Gen.:  well-nourished in appearance. Alert, appropriate and cooperative throughout exam. Appears younger than stated age  Head: Normocephalic without obvious abnormalities; irregular alopecia  Eyes: No corneal or conjunctival inflammation noted. Pupils equal round reactive to light and accommodation. Extraocular motion intact. Vision grossly normal. Ears: External  ear exam reveals no significant lesions or deformities. Canals clear .TMs normal. Hearing is grossly normal bilaterally. Nose: External nasal exam reveals no deformity or inflammation. Nasal mucosa are pink and moist. No lesions or exudates noted.   Mouth: Oral mucosa and oropharynx reveal no lesions or exudates. Teeth in good repair. Neck: No deformities, masses, or tenderness noted. Range of motion &Thyroid normal (physiologic asymmetry). Lungs: Normal respiratory effort; chest expands symmetrically. Lungs are clear to auscultation without rales, wheezes, or increased work of breathing. Heart: Normal rate and rhythm. Normal S1 and S2. No gallop, click, or rub. Grade 1/6 systolic murmur  Abdomen: Bowel sounds normal; abdomen soft and nontender. No masses, or organomegaly . Ventral  hernia noted. Genitalia: Genitalia normal except for left varices. Prostate with mild  Enlargement ; no asymmetry, nodularity, or induration.                                    Musculoskeletal/extremities: No deformity or scoliosis noted of  the thoracic or lumbar spine. No clubbing, cyanosis, edema, or significant extremity  deformity noted. Range of motion normal .Tone &  strength  normal.Joints normal . Nail health good. Able to lie down & sit up w/o help. Negative SLR bilaterally Vascular: Carotid, radial artery, dorsalis pedis and  posterior tibial pulses are full and equal. No bruits present. Neurologic: Alert and oriented x3. Deep tendon reflexes symmetrical and normal. Skin: Intact without suspicious  lesions or rashes.Solar changes Lymph: No cervical, axillary, or inguinal lymphadenopathy present. Psych: Mood and affect are normal. Normally interactive                                                                                         Assessment & Plan:  #1 Medicare Wellness Exam; criteria met ; data entered #2 Problem List reviewed ; Assessment/ Recommendations made Plan: see Orders

## 2012-09-08 ENCOUNTER — Telehealth: Payer: Self-pay | Admitting: *Deleted

## 2012-09-08 NOTE — Telephone Encounter (Signed)
Pt called concerned about Cr 1.8.  He saw Dr Alwyn Ren the other day and he is going to repeat lab work in 4 weeks.  Last Chemo was July 2013.  Per Dr Donnald Garre, agrees with the plan of pt getting cr re-checked in 4 weeks and if needed should possibly get kidney specialist consult if Dr Alwyn Ren feels it is appropriate.  Informed pt's wife, she verbalized understanding.  SLJ

## 2012-09-29 ENCOUNTER — Other Ambulatory Visit (INDEPENDENT_AMBULATORY_CARE_PROVIDER_SITE_OTHER): Payer: Medicare HMO

## 2012-09-29 DIAGNOSIS — N289 Disorder of kidney and ureter, unspecified: Secondary | ICD-10-CM

## 2012-09-29 LAB — BASIC METABOLIC PANEL
BUN: 21 mg/dL (ref 6–23)
Calcium: 9.9 mg/dL (ref 8.4–10.5)
Creatinine, Ser: 1.8 mg/dL — ABNORMAL HIGH (ref 0.4–1.5)
GFR: 39.02 mL/min — ABNORMAL LOW (ref >60.00)
Glucose, Bld: 151 mg/dL — ABNORMAL HIGH (ref 70–99)

## 2012-10-02 ENCOUNTER — Other Ambulatory Visit: Payer: Medicare HMO

## 2012-10-06 ENCOUNTER — Other Ambulatory Visit: Payer: Self-pay | Admitting: Internal Medicine

## 2012-10-06 ENCOUNTER — Telehealth: Payer: Self-pay | Admitting: Internal Medicine

## 2012-10-06 NOTE — Telephone Encounter (Signed)
Patient calling, states he was told his kidney function lab results were high, and that if he agreed, Dr. Alwyn Ren would refer him to a Nephrologist.  Patient would like referral, but to a kidney specialists in [place].

## 2012-10-06 NOTE — Telephone Encounter (Signed)
Order placed

## 2012-11-04 ENCOUNTER — Ambulatory Visit
Admission: RE | Admit: 2012-11-04 | Discharge: 2012-11-04 | Disposition: A | Discharge status: Home / Self Care | Payer: Medicare HMO | Source: Ambulatory Visit | Attending: Thoracic Surgery (Cardiothoracic Vascular Surgery) | Admitting: Thoracic Surgery (Cardiothoracic Vascular Surgery)

## 2012-11-04 ENCOUNTER — Encounter: Payer: Self-pay | Admitting: Thoracic Surgery (Cardiothoracic Vascular Surgery)

## 2012-11-04 ENCOUNTER — Ambulatory Visit (INDEPENDENT_AMBULATORY_CARE_PROVIDER_SITE_OTHER): Payer: Medicare HMO | Admitting: Thoracic Surgery (Cardiothoracic Vascular Surgery)

## 2012-11-04 VITALS — BP 168/109 | HR 78 | Resp 20 | Ht 71.0 in | Wt 228.0 lb

## 2012-11-04 DIAGNOSIS — Z9889 Other specified postprocedural states: Secondary | ICD-10-CM

## 2012-11-04 DIAGNOSIS — Z85118 Personal history of other malignant neoplasm of bronchus and lung: Secondary | ICD-10-CM

## 2012-11-04 DIAGNOSIS — Z902 Acquired absence of lung [part of]: Secondary | ICD-10-CM

## 2012-11-04 DIAGNOSIS — C3490 Malignant neoplasm of unspecified part of unspecified bronchus or lung: Secondary | ICD-10-CM

## 2012-11-04 NOTE — Progress Notes (Signed)
HPI: [name] returns today for his one-year followup visit after a left lower lobectomy for a stage II a non-small cell carcinoma. He was treated with postoperative adjuvant chemotherapy, which she tolerated well. He was last in the office in 3 months ago which time he was doing well. He says that in the interim he's noted a couple of times in the last couple of weeks that he has coughed when he swallowed food. He says this may be happens occasionally with his first set of liquids as well. He's still complaining of sensitivity of his right nipple, however that has gotten better and is only intermittently present now. He says his weight is stable, exactly put some weight back on since he finished chemotherapy. She had any problems with his breathing. 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   Past Medical History  Diagnosis Date  . Hypertension   . Peyronie disease   . Gilbert's syndrome   . Hyperlipidemia   . Amnesia     isolated;for 30 mins w/elevated BP  . Hematuria 2009    Dr Merry Lofty  . Hyperglycemia   . Diverticulosis of colon   . Cellulitis 2011    LUE (no PMH of MRSA)  . Peptic ulcer 1995  . Recurrent upper respiratory infection (URI)     09/2011  BRONCHITIS  . GERD (gastroesophageal reflux disease)     OCC HEARTBURN  . Neuromuscular disorder     2010 TEMPORARY MEMORY LOSS 1-1.5 HR WAS CHECKED OUT AT [facility] , NOTHING FOUND  . Arthritis   . Cancer     skin  . Lung cancer, lower lobe     Stage IIA (T1,N1)     Current Outpatient Prescriptions  Medication Sig Dispense Refill  . diltiazem (CARDIZEM CD) 240 MG 24 hr capsule Take 1 capsule (240 mg total) by mouth daily.  90 capsule  3  . losartan (COZAAR) 100 MG tablet Take 1 tablet (100 mg total) by mouth daily.  90 tablet  3   No current facility-administered medications for this visit.    Physical Exam: BP 168/109  Pulse 78  Resp 20  Ht 5\' 11"  (1.803 m)  Wt 228 lb (103.42 kg)  BMI  31.81 kg/m2  SpO63 58% 67 year old white male no acute distress General well-developed well-nourished Neurologic alert and oriented x3 with no focal deficits Lungs diminished breath sounds left base otherwise clear No cervical or supraclavicular adenopathy Cardiac regular rate and rhythm normal S1 and S2 no rubs or gallop  Diagnostic Tests: Chest x-ray 11/04/12 shows postoperative changes, no active disease  CT chest 09/02/12 reviewed CT CHEST WITHOUT CONTRAST  Technique: Multidetector CT imaging of the chest was performed  following the standard protocol without IV contrast.  Comparison: Multiple exams, including 06/03/2012  Findings: Left lower lobectomy noted. No pathologic thoracic  adenopathy. Small left pleural effusion is essentially stable from  prior. No change in scattered hepatic lesions. Colonic  diverticulosis noted. There is a small posterior diverticulum of  the stomach.  Thoracic spondylosis noted. Nonunited ridging noted between the  left sixth and seventh ribs posterolaterally. 2-3 mm nodule in the  right upper lobe, image 16 of series 5. 2-3 mm right upper lobe  nodule noted on image 23 of series 5 medially, stable.  A 4 mm ground-glass opacity is present in the right upper lobe  noted on image 29 of series 5.  IMPRESSION:  1. Stable small right upper lobe  nodules include two nodules in the  2-3 mm range in the right upper lobe (no change from 10/04/2011)  and a 4 mm ground-glass opacity in the right upper lobe, also  unchanged. Consider follow-up CT scan in 4-6 months time in order  to ensure continued stability.  2. Postoperative findings in the left chest, with stable small  residual left pleural effusion.  3. Stable hypodense liver lesions, probably cysts or similar  benign lesions.  4. Small gastric diverticulum. Colonic diverticulosis noted.   Impression: 66 year old gentleman now year out from a left lower lobectomy for stage II a non-small cell  carcinoma. He was treated with adjuvant chemotherapy afterwards. Overall he is doing well and he has no evidence of recurrent disease at this point in time. He does limit his creatinine is elevated and he has an appointment with nephrologist to evaluate that. Otherwise he feels well and is back to his normal activities.  Plan: I will see him back in August after his CT scan for a 4 month followup visit

## 2012-11-28 ENCOUNTER — Telehealth: Payer: Self-pay | Admitting: Internal Medicine

## 2012-11-28 NOTE — Telephone Encounter (Signed)
Spoke with Pt and advise him to contact Dr Primitivo Gauze and have him forward info to Dr Alwyn Ren so that we can order the approiate test and have the correct Dx . Pt ok will contact Dr Primitivo Gauze and have him forward info, med list updated as per Pt.

## 2012-11-28 NOTE — Telephone Encounter (Signed)
Kyle Long, thank you this was the appropriate response. I see no records from [facility] We would need a definitive diagnostic code to order the ultrasound.

## 2012-11-28 NOTE — Telephone Encounter (Signed)
Patient states Dr. Primitivo Gauze from [facility] is requesting an ultrasound on his kidneys, but his insurance requires a referral from his PCP.  Pt will be out of town all next week. Patient states Dr. Primitivo Gauze also recommended changing Losartan dosage from 100mg  to 50 mg. Pt states he will just split his 100mg  tablets in half. Please advise.

## 2012-12-08 DIAGNOSIS — I10 Essential (primary) hypertension: Secondary | ICD-10-CM | POA: Insufficient documentation

## 2012-12-08 DIAGNOSIS — C3490 Malignant neoplasm of unspecified part of unspecified bronchus or lung: Secondary | ICD-10-CM | POA: Insufficient documentation

## 2012-12-08 DIAGNOSIS — N189 Chronic kidney disease, unspecified: Secondary | ICD-10-CM | POA: Insufficient documentation

## 2013-01-09 ENCOUNTER — Encounter: Payer: Self-pay | Admitting: Internal Medicine

## 2013-01-09 ENCOUNTER — Ambulatory Visit (INDEPENDENT_AMBULATORY_CARE_PROVIDER_SITE_OTHER): Payer: Medicare HMO | Admitting: Internal Medicine

## 2013-01-09 VITALS — BP 152/88 | HR 86 | Temp 98.2°F | Wt 224.0 lb

## 2013-01-09 DIAGNOSIS — H2000 Unspecified acute and subacute iridocyclitis: Secondary | ICD-10-CM

## 2013-01-09 DIAGNOSIS — N6011 Diffuse cystic mastopathy of right breast: Secondary | ICD-10-CM

## 2013-01-09 DIAGNOSIS — N6019 Diffuse cystic mastopathy of unspecified breast: Secondary | ICD-10-CM

## 2013-01-09 NOTE — Patient Instructions (Addendum)
Fibrocystic breast changes can be aggravated by caffeine intake (coffee, tea, cola, or chocolate). Vitamin E 400 international units daily may help related symptoms.Mammograms if no better over 4 weeks

## 2013-01-09 NOTE — Progress Notes (Signed)
  Subjective:    Patient ID: Kyle Long, male    DOB: 04/15/1946, 67 y.o.   MRN: [id number]  HPI He noted a lump in the right breast a 01/07/13. This was noted incidentally and was tender to palpation.  He has had intermittent soreness of the right areola since November 2013.  There's been no associated discharge, fever, chills, sweats, or weight loss. There's been no change in the color or temperature of skin  He does drink 3 cups of coffee a day. He is not on spironolactone.  His mother had breast cancer    Review of Systems BP @ home averages 138-140/78-80. "I have white coat syndrome"  He is being treated for iritis by Dr. Hyacinth Meeker     Objective:   Physical Exam  He appears well-nourished in no acute distress  The left pupil is markedly dilated. No conjunctivitis is suggested clinically.  He has no lymphadenopathy about the neck or axilla  There is asymmetry of the breast; the right is larger than the left. He has bilateral fibrocystic changes, more prominent on the right  He has no organomegaly or masses. Ventral hernia is present        Assessment & Plan:  #1 fibrocystic breast disease  Plan: He's been asked to reduce his caffeine as much is possible; vitamin E4 100 international units will be recommended daily. If symptoms fail to improve over the next 4 weeks; mammography would be indicated

## 2013-03-03 ENCOUNTER — Ambulatory Visit (HOSPITAL_COMMUNITY)
Admission: RE | Admit: 2013-03-03 | Discharge: 2013-03-03 | Disposition: A | Discharge status: Home / Self Care | Payer: Medicare HMO | Source: Ambulatory Visit | Attending: Internal Medicine | Admitting: Internal Medicine

## 2013-03-03 ENCOUNTER — Other Ambulatory Visit (HOSPITAL_BASED_OUTPATIENT_CLINIC_OR_DEPARTMENT_OTHER): Payer: Medicare HMO | Admitting: Lab

## 2013-03-03 DIAGNOSIS — J90 Pleural effusion, not elsewhere classified: Secondary | ICD-10-CM | POA: Insufficient documentation

## 2013-03-03 DIAGNOSIS — C3490 Malignant neoplasm of unspecified part of unspecified bronchus or lung: Secondary | ICD-10-CM | POA: Insufficient documentation

## 2013-03-03 DIAGNOSIS — R918 Other nonspecific abnormal finding of lung field: Secondary | ICD-10-CM | POA: Insufficient documentation

## 2013-03-03 DIAGNOSIS — Z9221 Personal history of antineoplastic chemotherapy: Secondary | ICD-10-CM | POA: Insufficient documentation

## 2013-03-03 DIAGNOSIS — K7689 Other specified diseases of liver: Secondary | ICD-10-CM | POA: Insufficient documentation

## 2013-03-03 DIAGNOSIS — C3430 Malignant neoplasm of lower lobe, unspecified bronchus or lung: Secondary | ICD-10-CM

## 2013-03-03 DIAGNOSIS — Z902 Acquired absence of lung [part of]: Secondary | ICD-10-CM | POA: Insufficient documentation

## 2013-03-03 DIAGNOSIS — J984 Other disorders of lung: Secondary | ICD-10-CM | POA: Insufficient documentation

## 2013-03-03 LAB — COMPREHENSIVE METABOLIC PANEL (CC13)
ALT: 33 U/L (ref 0–55)
AST: 28 U/L (ref 5–34)
Alkaline Phosphatase: 48 U/L (ref 40–150)
CO2: 21 mEq/L — ABNORMAL LOW (ref 22–29)
Creatinine: 1.5 mg/dL — ABNORMAL HIGH (ref 0.7–1.3)
Total Bilirubin: 0.99 mg/dL (ref 0.20–1.20)

## 2013-03-03 LAB — CBC WITH DIFFERENTIAL/PLATELET
BASO%: 0.6 % (ref 0.0–2.0)
HCT: 39.3 % (ref 38.4–49.9)
LYMPH%: 41.9 % (ref 14.0–49.0)
MCHC: 34 g/dL (ref 32.0–36.0)
MCV: 96.1 fL (ref 79.3–98.0)
MONO#: 0.4 10*3/uL (ref 0.1–0.9)
MONO%: 5.9 % (ref 0.0–14.0)
NEUT%: 50.2 % (ref 39.0–75.0)
Platelets: 134 10*3/uL — ABNORMAL LOW (ref 140–400)
WBC: 7.1 10*3/uL (ref 4.0–10.3)

## 2013-03-04 ENCOUNTER — Telehealth: Payer: Self-pay | Admitting: Internal Medicine

## 2013-03-04 ENCOUNTER — Ambulatory Visit (HOSPITAL_BASED_OUTPATIENT_CLINIC_OR_DEPARTMENT_OTHER): Payer: Medicare HMO | Admitting: Internal Medicine

## 2013-03-04 ENCOUNTER — Encounter: Payer: Self-pay | Admitting: Internal Medicine

## 2013-03-04 DIAGNOSIS — C3430 Malignant neoplasm of lower lobe, unspecified bronchus or lung: Secondary | ICD-10-CM

## 2013-03-04 NOTE — Patient Instructions (Signed)
CURRENT THERAPY: None.  CHEMOTHERAPY INTENT: Curative/adjuvant  CURRENT # OF CHEMOTHERAPY CYCLES: 4  CURRENT ANTIEMETICS: Compazine  CURRENT SMOKING STATUS: Currently nonsmoker  ORAL CHEMOTHERAPY AND CONSENT: None  CURRENT BISPHOSPHONATES USE: None  PAIN MANAGEMENT: 0/10  NARCOTICS INDUCED CONSTIPATION: N/A  LIVING WILL AND CODE STATUS: Full code initially.  Followup visit in 6 months with repeat CT scan of the chest.

## 2013-03-04 NOTE — Progress Notes (Signed)
[facility] [phone]   Fax:([phone]  OFFICE PROGRESS NOTE  Marga Melnick, MD [phone] [address] [facility] [address] [place] [address]  DIAGNOSIS AND STAGE: Stage IIA (T1a, N1, M0) non-small cell lung cancer consistent with adenocarcinoma with negative EGFR mutation diagnosed in March of 2013.   PRIOR THERAPY:  1) S/P left video-assisted thoracoscopy, wedge resection of left lower lobe nodule, thoracoscopic left lower lobectomy, mediastinal lymph node dissection on 11/02/2011.  2) Adjuvant chemotherapy with cisplatin 75 mg/M2 and Alimta 500 mg/M2 every 3 weeks. She is status post 4 cycles.   CURRENT THERAPY: None.  CHEMOTHERAPY INTENT: Curative/adjuvant  CURRENT # OF CHEMOTHERAPY CYCLES: 4  CURRENT ANTIEMETICS: Compazine  CURRENT SMOKING STATUS: Currently nonsmoker  ORAL CHEMOTHERAPY AND CONSENT: None  CURRENT BISPHOSPHONATES USE: None  PAIN MANAGEMENT: 0/10  NARCOTICS INDUCED CONSTIPATION: N/A  LIVING WILL AND CODE STATUS: Full code initially.   INTERVAL HISTORY: Kyle Long 67 y.o. male returns to the clinic today for six-month followup visit. The patient is feeling fine today with no specific complaints. He denied having any significant chest pain, shortness breath, cough or hemoptysis. He denied having any weight loss or night sweats. The patient has no nausea or vomiting. He had repeat CT scan of the chest performed recently and he is here for evaluation and discussion of his scan results.  MEDICAL HISTORY: Past Medical History  Diagnosis Date  . Hypertension   . Peyronie disease   . Gilbert's syndrome   . Hyperlipidemia   . Amnesia     isolated;for 30 mins w/elevated BP  . Hematuria 2009    Dr Merry Lofty  . Hyperglycemia   . Diverticulosis of colon   . Cellulitis 2011    LUE (no PMH of MRSA)  . Peptic ulcer 1995  . Recurrent upper respiratory infection (URI)     09/2011  BRONCHITIS  . GERD  (gastroesophageal reflux disease)     OCC HEARTBURN  . Neuromuscular disorder     2010 TEMPORARY MEMORY LOSS 1-1.5 HR WAS CHECKED OUT AT [facility] , NOTHING FOUND  . Arthritis   . Cancer     skin  . Lung cancer, lower lobe     Stage IIA (T1,N1)    ALLERGIES:  is allergic to lisinopril.  MEDICATIONS:  Current Outpatient Prescriptions  Medication Sig Dispense Refill  . diltiazem (CARDIZEM CD) 240 MG 24 hr capsule Take 1 capsule (240 mg total) by mouth daily.  90 capsule  3  . losartan (COZAAR) 100 MG tablet Take 50 mg by mouth daily.      . vitamin E 400 UNIT capsule Take 400 Units by mouth daily.       No current facility-administered medications for this visit.    SURGICAL HISTORY:  Past Surgical History  Procedure Laterality Date  . Colonoscopy w/ polypectomy  2005    due 2015  . Vasectomy    . Cystoscopy  2009  . Vein surgery      R ankle  . Lobectomy  11/02/2011    Procedure: LOBECTOMY;  Surgeon: Loreli Slot, MD;  Location: [facility] OR;  Service: Thoracic;  Laterality: Left;  LEFT LOWER LOBECTOMY  . Refractive surgery      REVIEW OF SYSTEMS:  A comprehensive review of systems was negative.   PHYSICAL EXAMINATION: General appearance: alert, cooperative and no distress Head: Normocephalic, without obvious abnormality, atraumatic Neck: no adenopathy Lymph nodes: Cervical, supraclavicular, and axillary nodes normal.  Resp: clear to auscultation bilaterally Cardio: regular rate and rhythm, S1, S2 normal, no murmur, click, rub or gallop GI: soft, non-tender; bowel sounds normal; no masses,  no organomegaly Extremities: extremities normal, atraumatic, no cyanosis or edema  ECOG PERFORMANCE STATUS: 0 - Asymptomatic  Blood pressure 175/102, pulse 89, temperature 97.5 F (36.4 C), temperature source Oral, resp. rate 18, height 5\' 11"  (1.803 m), weight 221 lb 12.8 oz (100.608 kg).  LABORATORY DATA: Lab Results  Component Value Date   WBC 7.1 03/03/2013   HGB 13.4  03/03/2013   HCT 39.3 03/03/2013   MCV 96.1 03/03/2013   PLT 134* 03/03/2013      Chemistry      Component Value Date/Time   NA 136 03/03/2013 0853   NA 138 09/29/2012 0819   K 3.9 03/03/2013 0853   K 4.0 09/29/2012 0819   CL 105 09/29/2012 0819   CL 104 09/03/2012 1003   CO2 21* 03/03/2013 0853   CO2 24 09/29/2012 0819   BUN 19.6 03/03/2013 0853   BUN 21 09/29/2012 0819   CREATININE 1.5* 03/03/2013 0853   CREATININE 1.8* 09/29/2012 0819      Component Value Date/Time   CALCIUM 9.1 03/03/2013 0853   CALCIUM 9.9 09/29/2012 0819   ALKPHOS 48 03/03/2013 0853   ALKPHOS 74 02/29/2012 0839   AST 28 03/03/2013 0853   AST 21 02/29/2012 0839   ALT 33 03/03/2013 0853   ALT 28 02/29/2012 0839   BILITOT 0.99 03/03/2013 0853   BILITOT 0.6 02/29/2012 0839       RADIOGRAPHIC STUDIES: Ct Chest Wo Contrast  03/03/2013   *RADIOLOGY REPORT*  Clinical Data: Restaging for lung carcinoma.  The patient has completed chemotherapy and underwent a partial left lower lobectomy.  CT CHEST WITHOUT CONTRAST  Technique:  Multidetector CT imaging of the chest was performed following the standard protocol without IV contrast.  Comparison: Chest radiograph, 11/04/2012.  Chest CT, 09/02/2012.  Findings: There has been a left lower lobectomy.  Surgical anastomosis staples lie along the posterior inferior left hilum and along the posterior medial aspect of the remaining left lower lung. There is associated coarse reticular scarring and a small pleural effusion, all stable from the prior exam.  Left hemithorax volume loss is stable.  There are several sub centimeter right lung nodules that are stable, the largest arising from the right upper lobe inferiorly measuring 6 mm in size.  There are no new pulmonary nodules.  No right pleural effusion is seen.  Sub centimeter mediastinal adenopathy is unchanged from the prior exam.  There are no mediastinal or hilar masses or pathologically enlarged lymph nodes.  There are multiple low attenuation liver lesions.  These are  also all stable.  There are no adrenal masses.  There are no osteoblastic or osteolytic lesions.  IMPRESSION: No change from prior study.  No evidence of recurrent lung carcinoma.  Stable changes from the left lower lobectomy.  Stable small right pulmonary nodules, most likely benign. Continued surveillance to document 2 years of stability of these nodules is recommended to exclude metastatic disease or a new primary malignancy.  Stable low density liver lesions consistent with cysts.   Original Report Authenticated By: Amie Portland, M.D.    ASSESSMENT AND PLAN:  This is a very pleasant 67 years old white male with history of stage II a non-small cell lung cancer status post left lower lobectomy followed by 4 cycles of adjuvant chemotherapy with cisplatin and Alimta and has been observation  with no evidence for disease recurrence. I discussed the scan results with the patient and his wife. I recommended for him to continue on observation with repeat CT scan of the chest in 6 months. The patient was advised to call immediately if he has any concerning symptoms in the interval. He has a lump on the right breast but this is followed by his primary care physician and was suspected to be fibrocystic disease. I advise him to consider mammogram or ultrasound for further evaluation of this lesion has persisted or increased in size.  The patient voices understanding of current disease status and treatment options and is in agreement with the current care plan.  All questions were answered. The patient knows to call the clinic with any problems, questions or concerns. We can certainly see the patient much sooner if necessary.

## 2013-03-04 NOTE — Telephone Encounter (Signed)
gv and printed appt sched and avs for pt  °

## 2013-04-07 ENCOUNTER — Encounter: Payer: Self-pay | Admitting: Thoracic Surgery (Cardiothoracic Vascular Surgery)

## 2013-04-07 ENCOUNTER — Ambulatory Visit (INDEPENDENT_AMBULATORY_CARE_PROVIDER_SITE_OTHER): Payer: Medicare HMO | Admitting: Thoracic Surgery (Cardiothoracic Vascular Surgery)

## 2013-04-07 VITALS — BP 160/92 | HR 81 | Resp 16 | Ht 71.0 in | Wt 221.0 lb

## 2013-04-07 DIAGNOSIS — Z902 Acquired absence of lung [part of]: Secondary | ICD-10-CM

## 2013-04-07 DIAGNOSIS — Z85118 Personal history of other malignant neoplasm of bronchus and lung: Secondary | ICD-10-CM

## 2013-04-07 DIAGNOSIS — R918 Other nonspecific abnormal finding of lung field: Secondary | ICD-10-CM

## 2013-04-07 DIAGNOSIS — Z9889 Other specified postprocedural states: Secondary | ICD-10-CM

## 2013-04-07 NOTE — Progress Notes (Signed)
HPI:  Kyle Long returns today for a 4 month followup visit. He had a left lower lobectomy for a T1 N1 stage IIa non-small cell carcinoma in April of 2013. I last saw him in April for one year followup at which time he was doing well. He recently saw Dr. Arbutus Ped then had a CT of the chest done on August 5. It showed no evidence recurrent disease.  He says his been feeling well. He works out 3 times a week and is playing golf once or twice a week. He has not had any chest pain or shortness of breath. Has not had any unusual cough. He denies hemoptysis. His weight is been stable. Overall he feels well.  Past Medical History  Diagnosis Date  . Hypertension   . Peyronie disease   . Gilbert's syndrome   . Hyperlipidemia   . Amnesia     isolated;for 30 mins w/elevated BP  . Hematuria 2009    Dr Merry Lofty  . Hyperglycemia   . Diverticulosis of colon   . Cellulitis 2011    LUE (no PMH of MRSA)  . Peptic ulcer 1995  . Recurrent upper respiratory infection (URI)     09/2011  BRONCHITIS  . GERD (gastroesophageal reflux disease)     OCC HEARTBURN  . Neuromuscular disorder     2010 TEMPORARY MEMORY LOSS 1-1.5 HR WAS CHECKED OUT AT [facility] , NOTHING FOUND  . Arthritis   . Cancer     skin  . Lung cancer, lower lobe     Stage IIA (T1,N1)      Current Outpatient Prescriptions  Medication Sig Dispense Refill  . diltiazem (CARDIZEM CD) 240 MG 24 hr capsule Take 1 capsule (240 mg total) by mouth daily.  90 capsule  3  . losartan (COZAAR) 100 MG tablet Take 50 mg by mouth daily.      . vitamin E 400 UNIT capsule Take 400 Units by mouth daily.       No current facility-administered medications for this visit.    Physical Exam BP 160/92  Pulse 81  Resp 16  Ht 5\' 11"  (1.803 m)  Wt 221 lb (100.245 kg)  BMI 30.84 kg/m2  SpO35 39% 67 year old gentleman in no acute distress Neurologic alert and oriented x3 with no focal deficits Neck supple without any palpable cervical or  supraclavicular adenopathy Lungs clear with essentially equal breath sounds bilaterally Thoracotomy incision well-healed Cardiac regular rate and rhythm normal S1 and S2  Diagnostic Tests: CT of chest 03/03/13 CT CHEST WITHOUT CONTRAST  Technique: Multidetector CT imaging of the chest was performed  following the standard protocol without IV contrast.  Comparison: Chest radiograph, 11/04/2012. Chest CT, 09/02/2012.  Findings: There has been a left lower lobectomy. Surgical  anastomosis staples lie along the posterior inferior left hilum and  along the posterior medial aspect of the remaining left lower lung.  There is associated coarse reticular scarring and a small pleural  effusion, all stable from the prior exam. Left hemithorax volume  loss is stable.  There are several sub centimeter right lung nodules that are  stable, the largest arising from the right upper lobe inferiorly  measuring 6 mm in size. There are no new pulmonary nodules. No  right pleural effusion is seen.  Sub centimeter mediastinal adenopathy is unchanged from the prior  exam. There are no mediastinal or hilar masses or pathologically  enlarged lymph nodes.  There are multiple low attenuation liver lesions. These are also  all stable. There are no adrenal masses.  There are no osteoblastic or osteolytic lesions.  IMPRESSION:  No change from prior study. No evidence of recurrent lung  carcinoma.  Stable changes from the left lower lobectomy.  Stable small right pulmonary nodules, most likely benign.  Continued surveillance to document 2 years of stability of these  nodules is recommended to exclude metastatic disease or a new  primary malignancy.  Stable low density liver lesions consistent with cysts.  Original Report Authenticated By: Amie Portland, M.D.   Impression: 67 year old gentleman who had a left lower lobectomy for stage IIa non-small cell carcinoma back in April of 2013. He was treated with  adjuvant chemotherapy.  He currently is doing well with no evidence recurrent disease  He sees Dr. Arbutus Ped in February and will have a CT of the chest done at that time.  Plan: Return in April of 2015 for 2 year followup. We will do a PA and lateral chest x-ray at that time since he will have a CT only 2 months before that.

## 2013-06-04 ENCOUNTER — Other Ambulatory Visit: Payer: Self-pay

## 2013-06-15 ENCOUNTER — Ambulatory Visit (INDEPENDENT_AMBULATORY_CARE_PROVIDER_SITE_OTHER): Payer: Medicare HMO | Admitting: Internal Medicine

## 2013-06-15 ENCOUNTER — Encounter: Payer: Self-pay | Admitting: Internal Medicine

## 2013-06-15 VITALS — BP 141/91 | HR 96 | Temp 99.8°F | Resp 14 | Ht 71.0 in | Wt 221.0 lb

## 2013-06-15 DIAGNOSIS — Z85118 Personal history of other malignant neoplasm of bronchus and lung: Secondary | ICD-10-CM

## 2013-06-15 DIAGNOSIS — J069 Acute upper respiratory infection, unspecified: Secondary | ICD-10-CM

## 2013-06-15 DIAGNOSIS — J209 Acute bronchitis, unspecified: Secondary | ICD-10-CM

## 2013-06-15 MED ORDER — AZITHROMYCIN 250 MG PO TABS: ORAL_TABLET | ORAL | Status: DC

## 2013-06-15 MED ORDER — FLUTICASONE-SALMETEROL 250-50 MCG/DOSE IN AEPB: 1.0000 | INHALATION_SPRAY | Freq: Two times a day (BID) | RESPIRATORY_TRACT | Status: DC

## 2013-06-15 MED ORDER — HYDROCODONE-HOMATROPINE 5-1.5 MG/5ML PO SYRP: 5.0000 mL | ORAL_SOLUTION | Freq: Four times a day (QID) | ORAL | Status: DC | PRN

## 2013-06-15 NOTE — Progress Notes (Signed)
  Subjective:    Patient ID: Kyle Long, male    DOB: [birth year], 67 y.o.   MRN: [id number]  HPI  Symptoms began 06/11/13 as head and chest congestion of equal severity. His wife had a similar illness. He believes he has some postnasal drainage.  His cough is productive of clear sputum. It may be associated with some wheezing  Delsym with partial benefit  No PMH of asthma; he quit smoking in 1981.PMH of lung cancer; no recurrence on 8/14 CT scan   Review of Systems  He denies extrinsic symptoms of itchy, watery eyes, sneezing. He also has not had fever, chills, or sweats.  There's been no associated frontal headache, facial pain, nasal purulence, otic pain, or otic discharge. He has no shortness of breath with the cough.     Objective:   Physical Exam  General appearance:good health ;well nourished; no acute distress or increased work of breathing is present.  No  lymphadenopathy about the head, neck, or axilla noted.   Eyes: No conjunctival inflammation or lid edema is present. There is no scleral icterus.  Ears:  External ear exam shows no significant lesions or deformities.  Otoscopic examination reveals clear canals, tympanic membranes are intact bilaterally without bulging, retraction, inflammation or discharge.  Nose:  External nasal examination shows no deformity or inflammation. Nasal mucosa are pink and moist without lesions or exudates. No septal dislocation or deviation.No obstruction to airflow.   Oral exam: Dental hygiene is good; lips and gums are healthy appearing.There is no oropharyngeal erythema or exudate noted. Hoarse  Neck:  No deformities,  masses, or tenderness noted.     Heart:  Normal rate and regular rhythm. S1 and S2 normal without gallop, murmur, click, rub or other extra sounds. S4  Lungs : Scattered low-grade rhonchi greatest over the right anterior chest;but no increased work of breathing.    Extremities:  No cyanosis, edema, or clubbing  noted     Skin: Warm & dry w/o jaundice or tenting.         Assessment & Plan:  #1 acute bronchitis with some asymmetric  Bronchospasm. R/O CAP #2 URI, acute #3 PMH lung cancer Plan: See orders and recommendations

## 2013-06-15 NOTE — Progress Notes (Signed)
Pre visit review using our clinic review tool, if applicable. No additional management support is needed unless otherwise documented below in the visit note. 

## 2013-06-15 NOTE — Patient Instructions (Signed)
Advair one inhalation every 12 hours; gargle and spit after use Order for x-rays entered into  the computer; these will be performed at [facility]. No appointment is necessary.

## 2013-06-18 ENCOUNTER — Ambulatory Visit (HOSPITAL_BASED_OUTPATIENT_CLINIC_OR_DEPARTMENT_OTHER)
Admission: RE | Admit: 2013-06-18 | Discharge: 2013-06-18 | Disposition: A | Discharge status: Home / Self Care | Payer: Medicare HMO | Source: Ambulatory Visit | Attending: Internal Medicine | Admitting: Internal Medicine

## 2013-06-18 DIAGNOSIS — J209 Acute bronchitis, unspecified: Secondary | ICD-10-CM

## 2013-07-30 HISTORY — PX: COLONOSCOPY: SHX174

## 2013-08-14 ENCOUNTER — Telehealth: Payer: Self-pay | Admitting: *Deleted

## 2013-08-14 NOTE — Telephone Encounter (Signed)
I can submit referral to [facility] for Dr. Earlie Server, but Dr. Lew Dawes office must do the pre-cert for the CT scan because they are the ones ordering it.

## 2013-08-14 NOTE — Telephone Encounter (Signed)
Patient called and stated that they have an apt on February 3rd @ [facility] for a ct scan and blood work. Also has an apt on February 4th with dr Inda Merlin. Patient needs a referral. thanks

## 2013-09-01 ENCOUNTER — Ambulatory Visit (HOSPITAL_COMMUNITY)
Admission: RE | Admit: 2013-09-01 | Discharge: 2013-09-01 | Disposition: A | Discharge status: Home / Self Care | Payer: Medicare HMO | Source: Ambulatory Visit | Attending: Internal Medicine | Admitting: Internal Medicine

## 2013-09-01 ENCOUNTER — Other Ambulatory Visit (HOSPITAL_BASED_OUTPATIENT_CLINIC_OR_DEPARTMENT_OTHER): Payer: Medicare HMO

## 2013-09-01 DIAGNOSIS — C3430 Malignant neoplasm of lower lobe, unspecified bronchus or lung: Secondary | ICD-10-CM

## 2013-09-01 DIAGNOSIS — Z902 Acquired absence of lung [part of]: Secondary | ICD-10-CM | POA: Insufficient documentation

## 2013-09-01 DIAGNOSIS — C3490 Malignant neoplasm of unspecified part of unspecified bronchus or lung: Secondary | ICD-10-CM | POA: Insufficient documentation

## 2013-09-01 LAB — COMPREHENSIVE METABOLIC PANEL (CC13)
ALBUMIN: 4.1 g/dL (ref 3.5–5.0)
ALT: 35 U/L (ref 0–55)
ANION GAP: 9 meq/L (ref 3–11)
AST: 28 U/L (ref 5–34)
Alkaline Phosphatase: 50 U/L (ref 40–150)
BUN: 18.5 mg/dL (ref 7.0–26.0)
CALCIUM: 9.6 mg/dL (ref 8.4–10.4)
CHLORIDE: 102 meq/L (ref 98–109)
CO2: 27 meq/L (ref 22–29)
Creatinine: 1.5 mg/dL — ABNORMAL HIGH (ref 0.7–1.3)
GLUCOSE: 146 mg/dL — AB (ref 70–140)
POTASSIUM: 4.6 meq/L (ref 3.5–5.1)
SODIUM: 139 meq/L (ref 136–145)
TOTAL PROTEIN: 7 g/dL (ref 6.4–8.3)
Total Bilirubin: 0.84 mg/dL (ref 0.20–1.20)

## 2013-09-01 LAB — CBC WITH DIFFERENTIAL/PLATELET
BASO%: 0.5 % (ref 0.0–2.0)
BASOS ABS: 0 10*3/uL (ref 0.0–0.1)
EOS ABS: 0.1 10*3/uL (ref 0.0–0.5)
EOS%: 1.3 % (ref 0.0–7.0)
HCT: 40.5 % (ref 38.4–49.9)
HEMOGLOBIN: 13.7 g/dL (ref 13.0–17.1)
LYMPH%: 25.7 % (ref 14.0–49.0)
MCH: 32.9 pg (ref 27.2–33.4)
MCHC: 33.9 g/dL (ref 32.0–36.0)
MCV: 97 fL (ref 79.3–98.0)
MONO#: 0.6 10*3/uL (ref 0.1–0.9)
MONO%: 5.7 % (ref 0.0–14.0)
NEUT%: 66.8 % (ref 39.0–75.0)
NEUTROS ABS: 6.7 10*3/uL — AB (ref 1.5–6.5)
Platelets: 144 10*3/uL (ref 140–400)
RBC: 4.17 10*6/uL — AB (ref 4.20–5.82)
RDW: 13.6 % (ref 11.0–14.6)
WBC: 10 10*3/uL (ref 4.0–10.3)
lymph#: 2.6 10*3/uL (ref 0.9–3.3)

## 2013-09-02 ENCOUNTER — Ambulatory Visit (HOSPITAL_BASED_OUTPATIENT_CLINIC_OR_DEPARTMENT_OTHER): Payer: Commercial Managed Care - HMO | Admitting: Internal Medicine

## 2013-09-02 ENCOUNTER — Encounter: Payer: Self-pay | Admitting: Internal Medicine

## 2013-09-02 VITALS — BP 166/101 | HR 103 | Temp 99.2°F | Resp 21 | Ht 71.0 in | Wt 225.5 lb

## 2013-09-02 DIAGNOSIS — C3430 Malignant neoplasm of lower lobe, unspecified bronchus or lung: Secondary | ICD-10-CM

## 2013-09-02 DIAGNOSIS — C3490 Malignant neoplasm of unspecified part of unspecified bronchus or lung: Secondary | ICD-10-CM

## 2013-09-02 NOTE — Patient Instructions (Signed)
Followup visit in 6 months with repeat CT scan of the chest without contrast.

## 2013-09-02 NOTE — Progress Notes (Signed)
    [facility] [phone]   [phone]  OFFICE PROGRESS NOTE  William Hopper, MD [address] [place] [address]  DIAGNOSIS AND STAGE: Stage IIA (T1a, N1, M0) non-small cell lung cancer consistent with adenocarcinoma with negative EGFR mutation diagnosed in March of 2013.   PRIOR THERAPY:  1) S/P left video-assisted thoracoscopy, wedge resection of left lower lobe nodule, thoracoscopic left lower lobectomy, mediastinal lymph node dissection on 11/02/2011.  2) Adjuvant chemotherapy with cisplatin 75 mg/M2 and Alimta 500 mg/M2 every 3 weeks. She is status post 4 cycles.   CURRENT THERAPY: None.  CHEMOTHERAPY INTENT: Curative/adjuvant  CURRENT # OF CHEMOTHERAPY CYCLES: 0  CURRENT ANTIEMETICS: Compazine  CURRENT SMOKING STATUS: Currently nonsmoker  ORAL CHEMOTHERAPY AND CONSENT: None  CURRENT BISPHOSPHONATES USE: None  PAIN MANAGEMENT: 0/10  NARCOTICS INDUCED CONSTIPATION: N/A  LIVING WILL AND CODE STATUS: Full code initially.   INTERVAL HISTORY: Kyle Long 67 y.o. male returns to the clinic today for six-month followup visit accompanied by his wife. The patient is feeling fine today with no specific complaints. He is recovering from recent flu symptoms but continues to have mild chest congestion and mild cough. He is using Mucinex.  He denied having any significant chest pain, shortness of breath or hemoptysis. He denied having any weight loss or night sweats. The patient has no nausea or vomiting. He had repeat CT scan of the chest performed recently and he is here for evaluation and discussion of his scan results.  MEDICAL HISTORY: Past Medical History  Diagnosis Date  . Hypertension   . Peyronie disease   . Gilbert's syndrome   . Hyperlipidemia   . Amnesia     isolated;for 30 mins w/elevated BP  . Hematuria 2009    Dr Puchinsky  . Hyperglycemia   . Diverticulosis of colon   . Cellulitis  2011    LUE (no PMH of MRSA)  . Peptic ulcer 1995  . Recurrent upper respiratory infection (URI)     09/2011  BRONCHITIS  . GERD (gastroesophageal reflux disease)     OCC HEARTBURN  . Neuromuscular disorder     2010 TEMPORARY MEMORY LOSS 1-1.5 HR WAS CHECKED OUT AT [facility] , NOTHING FOUND  . Arthritis   . Cancer     skin  . Lung cancer, lower lobe     Stage IIA (T1,N1)    ALLERGIES:  is allergic to lisinopril.  MEDICATIONS:  Current Outpatient Prescriptions  Medication Sig Dispense Refill  . diltiazem (CARDIZEM CD) 240 MG 24 hr capsule Take 1 capsule (240 mg total) by mouth daily.  90 capsule  3  . losartan (COZAAR) 100 MG tablet Take 50 mg by mouth daily.      . vitamin E 400 UNIT capsule Take 400 Units by mouth daily.       No current facility-administered medications for this visit.    SURGICAL HISTORY:  Past Surgical History  Procedure Laterality Date  . Colonoscopy w/ polypectomy  2005    due 2015  . Vasectomy    . Cystoscopy  2009  . Vein surgery      R ankle  . Lobectomy  11/02/2011    Procedure: LOBECTOMY;  Surgeon: Steven C Hendrickson, MD;  Location: [facility] OR;  Service: Thoracic;  Laterality: Left;  LEFT LOWER LOBECTOMY  . Refractive surgery      REVIEW OF SYSTEMS:  A comprehensive review of systems was negative except   for: Respiratory: positive for cough   PHYSICAL EXAMINATION: General appearance: alert, cooperative and no distress Head: Normocephalic, without obvious abnormality, atraumatic Neck: no adenopathy Lymph nodes: Cervical, supraclavicular, and axillary nodes normal. Resp: clear to auscultation bilaterally Cardio: regular rate and rhythm, S1, S2 normal, no murmur, click, rub or gallop GI: soft, non-tender; bowel sounds normal; no masses,  no organomegaly Extremities: extremities normal, atraumatic, no cyanosis or edema  ECOG PERFORMANCE STATUS: 0 - Asymptomatic  Blood pressure 166/101, pulse 103, temperature 99.2 F (37.3 C), temperature  source Oral, resp. rate 21, height 5' 11" (1.803 m), weight 225 lb 8 oz (102.286 kg), SpO2 99.00%.  LABORATORY DATA: Lab Results  Component Value Date   WBC 10.0 09/01/2013   HGB 13.7 09/01/2013   HCT 40.5 09/01/2013   MCV 97.0 09/01/2013   PLT 144 09/01/2013      Chemistry      Component Value Date/Time   NA 139 09/01/2013 0910   NA 138 09/29/2012 0819   K 4.6 09/01/2013 0910   K 4.0 09/29/2012 0819   CL 105 09/29/2012 0819   CL 104 09/03/2012 1003   CO2 27 09/01/2013 0910   CO2 24 09/29/2012 0819   BUN 18.5 09/01/2013 0910   BUN 21 09/29/2012 0819   CREATININE 1.5* 09/01/2013 0910   CREATININE 1.8* 09/29/2012 0819      Component Value Date/Time   CALCIUM 9.6 09/01/2013 0910   CALCIUM 9.9 09/29/2012 0819   ALKPHOS 50 09/01/2013 0910   ALKPHOS 74 02/29/2012 0839   AST 28 09/01/2013 0910   AST 21 02/29/2012 0839   ALT 35 09/01/2013 0910   ALT 28 02/29/2012 0839   BILITOT 0.84 09/01/2013 0910   BILITOT 0.6 02/29/2012 0839       RADIOGRAPHIC STUDIES:  Ct Chest Wo Contrast  09/01/2013   CLINICAL DATA:  Lung cancer followup. Status post left lower lobectomy.  EXAM: CT CHEST WITHOUT CONTRAST  TECHNIQUE: Multidetector CT imaging of the chest was performed following the standard protocol without IV contrast.  COMPARISON:  03/03/2013  FINDINGS: There is no pleural effusion identified. Postoperative change from left lower lobectomy identified. Pulmonary nodule within the right upper lobe is identified measuring 7 mm, image 28/series 5. This is unchanged when compared with previous exam. No new or enlarging nodules identified.  The trachea appears patent and is midline. There is no endobronchial lesion identified. Heart size is normal. Pericardial effusion. No mediastinal or hilar adenopathy identified. There is no axillary or supraclavicular adenopathy identified.  Cyst within the left hepatic lobe stress set cysts identified within the left lobe of liver are low-attenuation lesions within both lobes of liver are again noted and  appears similar to previous exam. The gallbladder appears normal. No biliary dilatation. Normal appearance of the pancreas. The adrenal glands both appear normal.  Review of the visualized osseous structures is significant for mild multi level thoracic spondylosis.  IMPRESSION: 1. Stable CT of the chest. 2. Small nodule in the right upper lobe is unchanged from previous exam. 3. Stable low-attenuation foci within the liver parenchyma.   Electronically Signed   By: Taylor  Stroud M.D.   On: 09/01/2013 10:40     ASSESSMENT AND PLAN:  This is a very pleasant 67 years old white male with history of stage IIA non-small cell lung cancer status post left lower lobectomy followed by 4 cycles of adjuvant chemotherapy with cisplatin and Alimta and has been observation with no evidence for disease recurrence. I discussed the   scan results with the patient and his wife. I recommended for him to continue on observation with repeat CT scan of the chest without contrast in 6 months. The patient was advised to call immediately if he has any concerning symptoms in the interval. The patient voices understanding of current disease status and treatment options and is in agreement with the current care plan.  All questions were answered. The patient knows to call the clinic with any problems, questions or concerns. We can certainly see the patient much sooner if necessary.  Disclaimer: This note was dictated with voice recognition software. Similar sounding words can inadvertently be transcribed and may not be corrected upon review.        

## 2013-09-02 NOTE — Telephone Encounter (Signed)
appts made and printed. Pt is aware that cs will call for CT chest appt....td

## 2013-09-09 ENCOUNTER — Encounter: Payer: Self-pay | Admitting: Internal Medicine

## 2013-09-09 ENCOUNTER — Other Ambulatory Visit (INDEPENDENT_AMBULATORY_CARE_PROVIDER_SITE_OTHER): Payer: Medicare HMO

## 2013-09-09 ENCOUNTER — Ambulatory Visit (INDEPENDENT_AMBULATORY_CARE_PROVIDER_SITE_OTHER): Payer: Medicare HMO | Admitting: Internal Medicine

## 2013-09-09 VITALS — BP 138/88 | HR 74 | Temp 98.3°F | Resp 13 | Ht 71.0 in | Wt 220.8 lb

## 2013-09-09 DIAGNOSIS — R7309 Other abnormal glucose: Secondary | ICD-10-CM

## 2013-09-09 DIAGNOSIS — I10 Essential (primary) hypertension: Secondary | ICD-10-CM

## 2013-09-09 DIAGNOSIS — E785 Hyperlipidemia, unspecified: Secondary | ICD-10-CM

## 2013-09-09 DIAGNOSIS — D126 Benign neoplasm of colon, unspecified: Secondary | ICD-10-CM

## 2013-09-09 DIAGNOSIS — Z0000 Encounter for general adult medical examination without abnormal findings: Secondary | ICD-10-CM

## 2013-09-09 LAB — HEMOGLOBIN A1C: HEMOGLOBIN A1C: 5.7 % (ref 4.6–6.5)

## 2013-09-09 LAB — LIPID PANEL
CHOLESTEROL: 148 mg/dL (ref 0–200)
HDL: 35.3 mg/dL — ABNORMAL LOW (ref >39.00)
LDL Cholesterol: 80 mg/dL (ref 0–99)
Total CHOL/HDL Ratio: 4
Triglycerides: 165 mg/dL — ABNORMAL HIGH (ref 0.0–149.0)
VLDL: 33 mg/dL (ref 0.0–40.0)

## 2013-09-09 LAB — HEPATIC FUNCTION PANEL
ALBUMIN: 4.2 g/dL (ref 3.5–5.2)
ALT: 64 U/L — ABNORMAL HIGH (ref 0–53)
AST: 49 U/L — ABNORMAL HIGH (ref 0–37)
Alkaline Phosphatase: 47 U/L (ref 39–117)
Bilirubin, Direct: 0.2 mg/dL (ref 0.0–0.3)
Total Bilirubin: 1.1 mg/dL (ref 0.3–1.2)
Total Protein: 7.7 g/dL (ref 6.0–8.3)

## 2013-09-09 LAB — TSH: TSH: 2.24 u[IU]/mL (ref 0.35–5.50)

## 2013-09-09 MED ORDER — DILTIAZEM HCL ER COATED BEADS 240 MG PO CP24: 240.0000 mg | ORAL_CAPSULE | Freq: Every day | ORAL | Status: DC

## 2013-09-09 NOTE — Progress Notes (Signed)
Subjective:    Patient ID: Kyle Long, male    DOB: [birth year], 68 y.o.   MRN: [id number]  HPI Medicare Wellness Visit: Psychosocial and medical history were reviewed as required by Medicare (history related to abuse, antisocial behavior , firearm risk). Social history: Caffeine: 1.5 cups/ day , Alcohol:11 drinks/ week  , Tobacco IOM:BTDH 1981 Exercise:treadmill , golf ,yardwork 5X/week Personal safety/fall risk:no Limitations of activities of daily living:no Seatbelt/ smoke alarm use:yes Healthcare Power of Attorney/Living Will status: in place Ophthalmologic exam status:needed Hearing evaluation status:needed Orientation: Oriented X 3 Memory and recall: good Math testing: good Depression/anxiety assessment: no Foreign travel [place] Immunization status for influenza/pneumonia/ shingles /tetanus:shingles needed Transfusion history:no Preventive health care maintenance status: Colonoscopy as per protocol/standard care:due 2015 Dental care:annually Chart reviewed and updated. Active issues reviewed and addressed as documented below.    Review of Systems Blood pressure raverage 140/82 Compliant with anti hypertemsive medication. No lightheadedness or other adverse medication effect described.  Significant headaches, epistaxis, chest pain, palpitations, exertional dyspnea, claudication, paroxysmal nocturnal dyspnea, or edema absent.     Objective:   Physical Exam Gen.: Healthy and well-nourished in appearance. Alert, appropriate and cooperative throughout exam.   Head: Normocephalic without obvious abnormalities;  no alopecia  Eyes: No corneal or conjunctival inflammation noted. Pupils equal round reactive to light and accommodation. Extraocular motion intact.  Ears: External  ear exam reveals no significant lesions or deformities. Canals with some wax. Hearing is grossly normal bilaterally. Nose: External nasal exam reveals no deformity or inflammation.  Nasal mucosa are pink and moist. No lesions or exudates noted.   Mouth: Oral mucosa and oropharynx reveal no lesions or exudates. Teeth in good repair. Neck: No deformities, masses, or tenderness noted. Range of motion slightly decreased. Thyroid physiologic asymmetry. Lungs: Normal respiratory effort; chest expands symmetrically. Lungs are clear to auscultation without rales, wheezes, or increased work of breathing. Heart: Normal rate and rhythm. Normal S1 and S2. No gallop, click, or rub. S4 w/o murmur. Abdomen: Bowel sounds normal; abdomen soft and nontender. No masses, organomegaly or hernias noted. Genitalia: Genitalia normal except for left varices. Prostate is normal without enlargement, asymmetry, nodularity, or induration  .Musculoskeletal/extremities: No deformity or scoliosis noted of  the thoracic or lumbar spine.   No clubbing, cyanosis, edema, or significant extremity  deformity noted. Range of motion normal .Tone & strength normal. Hand joints normal . Fingernail  health good. Able to lie down & sit up w/o help. Negative SLR bilaterally Vascular: Carotid, radial artery, dorsalis pedis and  posterior tibial pulses are full and equal. No bruits present. Neurologic: Alert and oriented x3. Deep tendon reflexes symmetrical and normal.  Gait normal   .       Skin: Intact without suspicious lesions or rashes. Lymph: No cervical, axillary, or inguinal lymphadenopathy present. Psych: Mood and affect are normal. Normally interactive                                                                                        Assessment & Plan:  #1 Medicare Wellness Exam; criteria met ; data entered #2 Problem List/Diagnoses reviewed Plan:  Assessments made/  Orders entered

## 2013-09-09 NOTE — Patient Instructions (Signed)
Your next office appointment will be determined based upon review of your pending labs. Those instructions will be transmitted to you through My Chart . 

## 2013-09-09 NOTE — Progress Notes (Signed)
Pre visit review using our clinic review tool, if applicable. No additional management support is needed unless otherwise documented below in the visit note. 

## 2013-09-10 ENCOUNTER — Encounter: Payer: Self-pay | Admitting: Internal Medicine

## 2013-09-12 ENCOUNTER — Other Ambulatory Visit: Payer: Self-pay | Admitting: Internal Medicine

## 2013-09-12 DIAGNOSIS — R740 Nonspecific elevation of levels of transaminase and lactic acid dehydrogenase [LDH]: Principal | ICD-10-CM

## 2013-09-12 DIAGNOSIS — R7402 Elevation of levels of lactic acid dehydrogenase (LDH): Secondary | ICD-10-CM

## 2013-10-06 ENCOUNTER — Ambulatory Visit (AMBULATORY_SURGERY_CENTER): Payer: Self-pay

## 2013-10-06 VITALS — Ht 70.5 in | Wt 223.4 lb

## 2013-10-06 DIAGNOSIS — Z86010 Personal history of colonic polyps: Secondary | ICD-10-CM

## 2013-10-06 MED ORDER — MOVIPREP 100 G PO SOLR: ORAL | Status: DC

## 2013-10-26 ENCOUNTER — Encounter: Payer: Self-pay | Admitting: Internal Medicine

## 2013-10-26 ENCOUNTER — Ambulatory Visit (AMBULATORY_SURGERY_CENTER): Payer: Medicare HMO | Admitting: Internal Medicine

## 2013-10-26 VITALS — BP 144/75 | HR 59 | Temp 97.7°F | Resp 18 | Ht 70.5 in | Wt 223.0 lb

## 2013-10-26 DIAGNOSIS — D126 Benign neoplasm of colon, unspecified: Secondary | ICD-10-CM

## 2013-10-26 DIAGNOSIS — Z86010 Personal history of colonic polyps: Secondary | ICD-10-CM

## 2013-10-26 DIAGNOSIS — Z1211 Encounter for screening for malignant neoplasm of colon: Secondary | ICD-10-CM

## 2013-10-26 MED ORDER — SODIUM CHLORIDE 0.9 % IV SOLN: 500.0000 mL | INTRAVENOUS | Status: DC

## 2013-10-26 NOTE — Progress Notes (Signed)
Benjamin CRNA notified of pt's elevated BP on admission

## 2013-10-26 NOTE — Progress Notes (Signed)
Procedure ends, to recovery, report given and VSS. 

## 2013-10-26 NOTE — Op Note (Signed)
[facility]  [address] [place], [id number]   COLONOSCOPY PROCEDURE REPORT  PATIENT: Kyle Long, Kyle Long  MR#: [id number] BIRTHDATE: [birth year] , 67  yrs. old GENDER: Male ENDOSCOPIST: Jerene Bears, MD REFERRED BW:IOMBTDH Linna Darner, M.D. PROCEDURE DATE:  10/26/2013 PROCEDURE:   Colonoscopy with snare polypectomy First Screening Colonoscopy - Avg.  risk and is 50 yrs.  old or older - No.  Prior Negative Screening - Now for repeat screening. N/A  History of Adenoma - Now for follow-up colonoscopy & has been > or = to 3 yrs.  N/A  Polyps Removed Today? Yes. ASA CLASS:   Class III INDICATIONS:elevated risk screening and Patient's personal history of colon polyps. MEDICATIONS: MAC sedation, administered by CRNA and propofol (Diprivan) 250mg  IV  DESCRIPTION OF PROCEDURE:   After the risks benefits and alternatives of the procedure were thoroughly explained, informed consent was obtained.  A digital rectal exam revealed no rectal mass.   The LB [id number]  endoscope was introduced through the anus and advanced to the cecum, which was identified by both the appendix and ileocecal valve. No adverse events experienced. The quality of the prep was Moviprep fair despite copious irrigation and lavage.  The instrument was then slowly withdrawn as the colon was fully examined.   COLON FINDINGS: Severe diverticulosis was noted throughout the entire examined colon.   Two sessile polyps measuring 5 mm in size were found in the transverse colon.  Polypectomy was performed using cold snare.  All resections were complete and all polyp tissue was completely retrieved.  Retroflexed views revealed external hemorrhoids. The time to cecum=4 minutes 04 seconds. Withdrawal time=11 minutes 38 seconds.  The scope was withdrawn and the procedure completed. COMPLICATIONS: There were no complications.  ENDOSCOPIC IMPRESSION: 1.   Severe diverticulosis was noted throughout the entire  examined colon 2.   Two sessile polyps measuring 5 mm in size were found in the transverse colon; Polypectomy was performed using cold snare  RECOMMENDATIONS: 1.  Await pathology results 2.  High fiber diet 3.  Timing of repeat colonoscopy will be determined by pathology findings. 4.  You will receive a letter within 1-2 weeks with the results of your biopsy as well as final recommendations.  Please call my office if you have not received a letter after 3 weeks.   eSigned:  Jerene Bears, MD 10/26/2013 10:26 AM cc: The Patient and Hendricks Limes, MD

## 2013-10-26 NOTE — Progress Notes (Signed)
Called to room to assist during endoscopic procedure.  Patient ID and intended procedure confirmed with present staff. Received instructions for my participation in the procedure from the performing physician.  

## 2013-10-26 NOTE — Patient Instructions (Signed)
YOU HAD AN ENDOSCOPIC PROCEDURE TODAY AT [facility]: Refer to the procedure report that was given to you for any specific questions about what was found during the examination.  If the procedure report does not answer your questions, please call your gastroenterologist to clarify.  If you requested that your care partner not be given the details of your procedure findings, then the procedure report has been included in a sealed envelope for you to review at your convenience later.  YOU SHOULD EXPECT: Some feelings of bloating in the abdomen. Passage of more gas than usual.  Walking can help get rid of the air that was put into your GI tract during the procedure and reduce the bloating. If you had a lower endoscopy (such as a colonoscopy or flexible sigmoidoscopy) you may notice spotting of blood in your stool or on the toilet paper. If you underwent a bowel prep for your procedure, then you may not have a normal bowel movement for a few days.  DIET: Your first meal following the procedure should be a light meal and then it is ok to progress to your normal diet.  A half-sandwich or bowl of soup is an example of a good first meal.  Heavy or fried foods are harder to digest and may make you feel nauseous or bloated.  Likewise meals heavy in dairy and vegetables can cause extra gas to form and this can also increase the bloating.  Drink plenty of fluids but you should avoid alcoholic beverages for 24 hours.  Try to eat a lot more fiber.  You have severe Diverticulosis, and the fiber will help to prevent Diverticulitis.  ACTIVITY: Your care partner should take you home directly after the procedure.  You should plan to take it easy, moving slowly for the rest of the day.  You can resume normal activity the day after the procedure however you should NOT DRIVE or use heavy machinery for 24 hours (because of the sedation medicines used during the test).    SYMPTOMS TO REPORT IMMEDIATELY: A  gastroenterologist can be reached at any hour.  During normal business hours, 8:30 AM to 5:00 PM Monday through Friday, call [phone].  After hours and on weekends, please call the GI answering service at [phone] who will take a message and have the physician on call contact you.   Following lower endoscopy (colonoscopy or flexible sigmoidoscopy):  Excessive amounts of blood in the stool  Significant tenderness or worsening of abdominal pains  Swelling of the abdomen that is new, acute  Fever of 100F or higher  FOLLOW UP: If any biopsies were taken you will be contacted by phone or by letter within the next 1-3 weeks.  Call your gastroenterologist if you have not heard about the biopsies in 3 weeks.  Our staff will call the home number listed on your records the next business day following your procedure to check on you and address any questions or concerns that you may have at that time regarding the information given to you following your procedure. This is a courtesy call and so if there is no answer at the home number and we have not heard from you through the emergency physician on call, we will assume that you have returned to your regular daily activities without incident.  SIGNATURES/CONFIDENTIALITY: You and/or your care partner have signed paperwork which will be entered into your electronic medical record.  These signatures attest to the fact that that the  information above on your After Visit Summary has been reviewed and is understood.  Full responsibility of the confidentiality of this discharge information lies with you and/or your care-partner.

## 2013-10-27 ENCOUNTER — Telehealth: Payer: Self-pay

## 2013-10-27 NOTE — Telephone Encounter (Signed)
  Follow up Call-  Call back number 10/26/2013  Post procedure Call Back phone  # [phone]  Permission to leave phone message Yes     Patient questions:  Do you have a fever, pain , or abdominal swelling? no Pain Score  0 *  Have you tolerated food without any problems? yes  Have you been able to return to your normal activities? yes  Do you have any questions about your discharge instructions: Diet   no Medications  no Follow up visit  no  Do you have questions or concerns about your Care? no  Actions: * If pain score is 4 or above: No action needed, pain <4.  No problems per the pt. Maw

## 2013-11-01 ENCOUNTER — Encounter: Payer: Self-pay | Admitting: Internal Medicine

## 2013-11-23 ENCOUNTER — Other Ambulatory Visit: Payer: Self-pay | Admitting: Thoracic Surgery (Cardiothoracic Vascular Surgery)

## 2013-11-23 DIAGNOSIS — C3490 Malignant neoplasm of unspecified part of unspecified bronchus or lung: Secondary | ICD-10-CM

## 2013-11-24 ENCOUNTER — Ambulatory Visit
Admission: RE | Admit: 2013-11-24 | Discharge: 2013-11-24 | Disposition: A | Discharge status: Home / Self Care | Payer: Medicare HMO | Source: Ambulatory Visit | Attending: Thoracic Surgery (Cardiothoracic Vascular Surgery) | Admitting: Thoracic Surgery (Cardiothoracic Vascular Surgery)

## 2013-11-24 ENCOUNTER — Encounter: Payer: Self-pay | Admitting: Thoracic Surgery (Cardiothoracic Vascular Surgery)

## 2013-11-24 ENCOUNTER — Ambulatory Visit (INDEPENDENT_AMBULATORY_CARE_PROVIDER_SITE_OTHER): Payer: Commercial Managed Care - HMO | Admitting: Thoracic Surgery (Cardiothoracic Vascular Surgery)

## 2013-11-24 VITALS — BP 177/103 | HR 77 | Resp 16 | Ht 71.0 in | Wt 225.0 lb

## 2013-11-24 DIAGNOSIS — Z902 Acquired absence of lung [part of]: Secondary | ICD-10-CM

## 2013-11-24 DIAGNOSIS — Z9889 Other specified postprocedural states: Secondary | ICD-10-CM

## 2013-11-24 DIAGNOSIS — C3430 Malignant neoplasm of lower lobe, unspecified bronchus or lung: Secondary | ICD-10-CM

## 2013-11-24 DIAGNOSIS — C3490 Malignant neoplasm of unspecified part of unspecified bronchus or lung: Secondary | ICD-10-CM

## 2013-11-24 NOTE — Progress Notes (Signed)
HPI:  [name] returns today for scheduled 6 month followup visit.  He is a 68 year old gentleman who had a thoracoscopic left lower lobectomy for a T1 N1, stage II a non-small cell carcinoma in April 2013. He was treated with postoperative adjuvant chemotherapy with cisplatin and Alimta.  He was last in the office in September 2014 which time he was doing well with no evidence recurrent disease.  In the interim since his last visit he saw Dr. Julien Nordmann in February.  He says that he is doing well. He is not having any significant issues with shortness of breath. He has an occasional cough but nothing out of the ordinary. He denies hemoptysis. His weight has been stable. He has not had any unusual headaches or visual changes. Overall he feels well.  Past Medical History  Diagnosis Date  . Hypertension   . Peyronie disease   . Gilbert's syndrome   . Hyperlipidemia   . Amnesia 2009    isolated;for 30 mins w/elevated BP  . Hematuria 2009    Dr Burnell Blanks  . Hyperglycemia   . Diverticulosis of colon   . Cellulitis 2011    LUE (no PMH of MRSA)/left elbow  . Peptic ulcer 1995  . Recurrent upper respiratory infection (URI)     09/2011  BRONCHITIS  . GERD (gastroesophageal reflux disease)     OCC HEARTBURN  . Neuromuscular disorder     2010 TEMPORARY MEMORY LOSS 1-1.5 HR WAS CHECKED OUT AT [facility] , NOTHING FOUND  . Arthritis   . Cancer     skin / top head  . Lung cancer, lower lobe     Stage IIA (T1,N1)      Current Outpatient Prescriptions  Medication Sig Dispense Refill  . diltiazem (CARDIZEM CD) 240 MG 24 hr capsule Take 1 capsule (240 mg total) by mouth daily.  90 capsule  3  . losartan (COZAAR) 100 MG tablet Take 50 mg by mouth daily.      . vitamin E 400 UNIT capsule Take 400 Units by mouth daily.       No current facility-administered medications for this visit.    Physical Exam BP 177/103  Pulse 77  Resp 16  Ht 5\' 11"  (1.803 m)  Wt 225 lb (102.059 kg)   BMI 31.39 kg/m2  SpO52 40% 68 year old man in no acute distress Alert and oriented x3 with no neurologic deficits No cervical or subclavicular adenopathy Lungs slightly diminished at left base otherwise clear Cardiac regular rate and rhythm Incisions well healed midline   Diagnostic Tests:  CT CHEST WITHOUT CONTRAST  TECHNIQUE:  Multidetector CT imaging of the chest was performed following the  standard protocol without IV contrast.  COMPARISON: 03/03/2013  FINDINGS:  There is no pleural effusion identified. Postoperative change from  left lower lobectomy identified. Pulmonary nodule within the right  upper lobe is identified measuring 7 mm, image 28/series 5. This is  unchanged when compared with previous exam. No new or enlarging  nodules identified.  The trachea appears patent and is midline. There is no endobronchial  lesion identified. Heart size is normal. Pericardial effusion. No  mediastinal or hilar adenopathy identified. There is no axillary or  supraclavicular adenopathy identified.  Cyst within the left hepatic lobe stress set cysts identified within  the left lobe of liver are low-attenuation lesions within both lobes  of liver are again noted and appears similar to previous exam. The  gallbladder appears normal. No biliary dilatation. Normal appearance  of the pancreas. The adrenal glands both appear normal.  Review of the visualized osseous structures is significant for mild  multi level thoracic spondylosis.  IMPRESSION:  1. Stable CT of the chest.  2. Small nodule in the right upper lobe is unchanged from previous  exam.  3. Stable low-attenuation foci within the liver parenchyma.  Electronically Signed  By: Kerby Moors M.D.  On: 09/01/2013 10:40   CHEST 2 VIEW  COMPARISON: 06/18/2013  FINDINGS:  Heart size and pulmonary vascularity are normal. Lungs are clear.  The patient has had a left lower lobectomy. There is chronic  blunting of the left  costophrenic angle. No osseous abnormality.  IMPRESSION:  No acute abnormalities. No change since 06/18/2013.  Electronically Signed  By: Rozetta Nunnery M.D.  On: 11/24/2013 10:49    Impression: 68 year old gentleman who is now 2 years out from a lower lobectomy for a stage IIA non-small cell carcinoma. He was treated with adjuvant chemotherapy. He has no evidence of recurrent disease.  I did discuss with them that the most encouraged to recur in the first 2 years however we need to continue to follow him up to 5 years to be comfortable that we have a cure.  He sees Dr. Julien Nordmann in 6 months and will have a CT at that time  I will plan to see him back in one year with CT of the chest.

## 2014-02-01 ENCOUNTER — Telehealth: Payer: Self-pay | Admitting: Internal Medicine

## 2014-02-01 NOTE — Telephone Encounter (Signed)
s.w. pt and advised on 8.10 appt moved to 8.18 due to MD on Pal...pt ok and aware °

## 2014-03-02 ENCOUNTER — Other Ambulatory Visit (HOSPITAL_BASED_OUTPATIENT_CLINIC_OR_DEPARTMENT_OTHER): Payer: Commercial Managed Care - HMO

## 2014-03-02 ENCOUNTER — Ambulatory Visit (HOSPITAL_COMMUNITY)
Admission: RE | Admit: 2014-03-02 | Discharge: 2014-03-02 | Disposition: A | Discharge status: Home / Self Care | Payer: Medicare HMO | Source: Ambulatory Visit | Attending: Internal Medicine | Admitting: Internal Medicine

## 2014-03-02 DIAGNOSIS — J984 Other disorders of lung: Secondary | ICD-10-CM | POA: Insufficient documentation

## 2014-03-02 DIAGNOSIS — Z9221 Personal history of antineoplastic chemotherapy: Secondary | ICD-10-CM | POA: Diagnosis not present

## 2014-03-02 DIAGNOSIS — Z902 Acquired absence of lung [part of]: Secondary | ICD-10-CM | POA: Diagnosis not present

## 2014-03-02 DIAGNOSIS — C3430 Malignant neoplasm of lower lobe, unspecified bronchus or lung: Secondary | ICD-10-CM | POA: Diagnosis not present

## 2014-03-02 DIAGNOSIS — C3490 Malignant neoplasm of unspecified part of unspecified bronchus or lung: Secondary | ICD-10-CM | POA: Diagnosis present

## 2014-03-02 DIAGNOSIS — K5730 Diverticulosis of large intestine without perforation or abscess without bleeding: Secondary | ICD-10-CM | POA: Diagnosis not present

## 2014-03-02 DIAGNOSIS — R918 Other nonspecific abnormal finding of lung field: Secondary | ICD-10-CM | POA: Diagnosis not present

## 2014-03-02 DIAGNOSIS — Z923 Personal history of irradiation: Secondary | ICD-10-CM | POA: Insufficient documentation

## 2014-03-02 LAB — CBC WITH DIFFERENTIAL/PLATELET
BASO%: 0.5 % (ref 0.0–2.0)
Basophils Absolute: 0 10*3/uL (ref 0.0–0.1)
EOS%: 0.7 % (ref 0.0–7.0)
Eosinophils Absolute: 0.1 10*3/uL (ref 0.0–0.5)
HCT: 42.5 % (ref 38.4–49.9)
HGB: 14.2 g/dL (ref 13.0–17.1)
LYMPH%: 44.3 % (ref 14.0–49.0)
MCH: 32.5 pg (ref 27.2–33.4)
MCHC: 33.3 g/dL (ref 32.0–36.0)
MCV: 97.5 fL (ref 79.3–98.0)
MONO#: 0.4 10*3/uL (ref 0.1–0.9)
MONO%: 5.3 % (ref 0.0–14.0)
NEUT#: 4 10*3/uL (ref 1.5–6.5)
NEUT%: 49.2 % (ref 39.0–75.0)
Platelets: 153 10*3/uL (ref 140–400)
RBC: 4.36 10*6/uL (ref 4.20–5.82)
RDW: 13.4 % (ref 11.0–14.6)
WBC: 8.2 10*3/uL (ref 4.0–10.3)
lymph#: 3.6 10*3/uL — ABNORMAL HIGH (ref 0.9–3.3)

## 2014-03-02 LAB — COMPREHENSIVE METABOLIC PANEL (CC13)
ALT: 33 U/L (ref 0–55)
AST: 29 U/L (ref 5–34)
Albumin: 4 g/dL (ref 3.5–5.0)
Alkaline Phosphatase: 48 U/L (ref 40–150)
Anion Gap: 8 mEq/L (ref 3–11)
BUN: 18.3 mg/dL (ref 7.0–26.0)
CALCIUM: 9.8 mg/dL (ref 8.4–10.4)
CHLORIDE: 105 meq/L (ref 98–109)
CO2: 25 meq/L (ref 22–29)
Creatinine: 1.4 mg/dL — ABNORMAL HIGH (ref 0.7–1.3)
Glucose: 121 mg/dl (ref 70–140)
Potassium: 4.1 mEq/L (ref 3.5–5.1)
Sodium: 138 mEq/L (ref 136–145)
Total Bilirubin: 0.88 mg/dL (ref 0.20–1.20)
Total Protein: 7.2 g/dL (ref 6.4–8.3)

## 2014-03-09 ENCOUNTER — Ambulatory Visit: Payer: Medicare HMO | Admitting: Internal Medicine

## 2014-03-16 ENCOUNTER — Encounter: Payer: Self-pay | Admitting: Internal Medicine

## 2014-03-16 ENCOUNTER — Ambulatory Visit (HOSPITAL_BASED_OUTPATIENT_CLINIC_OR_DEPARTMENT_OTHER): Payer: Commercial Managed Care - HMO | Admitting: Internal Medicine

## 2014-03-16 ENCOUNTER — Telehealth: Payer: Self-pay | Admitting: Internal Medicine

## 2014-03-16 VITALS — BP 166/88 | HR 67 | Temp 98.2°F | Resp 18 | Ht 71.0 in | Wt 226.8 lb

## 2014-03-16 DIAGNOSIS — C3492 Malignant neoplasm of unspecified part of left bronchus or lung: Secondary | ICD-10-CM

## 2014-03-16 DIAGNOSIS — C3430 Malignant neoplasm of lower lobe, unspecified bronchus or lung: Secondary | ICD-10-CM

## 2014-03-16 NOTE — Telephone Encounter (Signed)
gv adn printed appt scehd and avs for pt for Feb 2016

## 2014-03-16 NOTE — Progress Notes (Signed)
[facility] Telephone:([phone]) [phone]   Fax:([phone]) [phone]  OFFICE PROGRESS NOTE  Unice Cobble, MD [address] [address]  DIAGNOSIS AND STAGE: Stage IIA (T1a, N1, M0) non-small cell lung cancer consistent with adenocarcinoma with negative EGFR mutation diagnosed in March of 2013.   PRIOR THERAPY:  1) S/P left video-assisted thoracoscopy, wedge resection of left lower lobe nodule, thoracoscopic left lower lobectomy, mediastinal lymph node dissection on 11/02/2011.  2) Adjuvant chemotherapy with cisplatin 75 mg/M2 and Alimta 500 mg/M2 every 3 weeks. She is status post 4 cycles.   CURRENT THERAPY: Observation.  CHEMOTHERAPY INTENT: Curative/adjuvant  CURRENT # OF CHEMOTHERAPY CYCLES: 0  CURRENT ANTIEMETICS: Compazine  CURRENT SMOKING STATUS: Currently nonsmoker  ORAL CHEMOTHERAPY AND CONSENT: None  CURRENT BISPHOSPHONATES USE: None  PAIN MANAGEMENT: 0/10  NARCOTICS INDUCED CONSTIPATION: N/A  LIVING WILL AND CODE STATUS: Full code initially.   INTERVAL HISTORY: Kyle Long 68 y.o. male returns to the clinic today for six-month followup visit. The patient is feeling fine today with no specific complaints. He denied having any significant chest pain, shortness of breath or hemoptysis. He denied having any weight loss or night sweats. The patient has no nausea or vomiting. He had repeat CT scan of the chest performed recently and he is here for evaluation and discussion of his scan results.  MEDICAL HISTORY: Past Medical History  Diagnosis Date  . Hypertension   . Peyronie disease   . Gilbert's syndrome   . Hyperlipidemia   . Amnesia 2009    isolated;for 30 mins w/elevated BP  . Hematuria 2009    Dr Burnell Blanks  . Hyperglycemia   . Diverticulosis of colon   . Cellulitis 2011    LUE (no PMH of MRSA)/left elbow  . Peptic ulcer 1995  . Recurrent upper respiratory infection (URI)     09/2011  BRONCHITIS  . GERD (gastroesophageal  reflux disease)     OCC HEARTBURN  . Neuromuscular disorder     2010 TEMPORARY MEMORY LOSS 1-1.5 HR WAS CHECKED OUT AT [facility] , NOTHING FOUND  . Arthritis   . Cancer     skin / top head  . Lung cancer, lower lobe     Stage IIA (T1,N1)    ALLERGIES:  is allergic to lisinopril.  MEDICATIONS:  Current Outpatient Prescriptions  Medication Sig Dispense Refill  . diltiazem (CARDIZEM CD) 240 MG 24 hr capsule Take 1 capsule (240 mg total) by mouth daily.  90 capsule  3  . losartan (COZAAR) 100 MG tablet Take 50 mg by mouth daily.       No current facility-administered medications for this visit.    SURGICAL HISTORY:  Past Surgical History  Procedure Laterality Date  . Colonoscopy w/ polypectomy  2005    due 2015  . Vasectomy    . Cystoscopy  2009  . Vein surgery      R ankle  . Lobectomy  11/02/2011    Procedure: LOBECTOMY;  Surgeon: Melrose Nakayama, MD;  Location: [facility];  Service: Thoracic;  Laterality: Left;  LEFT LOWER LOBECTOMY  . Refractive surgery      Bil  . Chemo treatment       4 treatments that last 7 hours each/last treatmebt in 2013    REVIEW OF SYSTEMS:  A comprehensive review of systems was negative.   PHYSICAL EXAMINATION: General appearance: alert, cooperative and no distress Head: Normocephalic, without obvious abnormality, atraumatic Neck: no adenopathy  Lymph nodes: Cervical, supraclavicular, and axillary nodes normal. Resp: clear to auscultation bilaterally Cardio: regular rate and rhythm, S1, S2 normal, no murmur, click, rub or gallop GI: soft, non-tender; bowel sounds normal; no masses,  no organomegaly Extremities: extremities normal, atraumatic, no cyanosis or edema  ECOG PERFORMANCE STATUS: 0 - Asymptomatic  Blood pressure 166/88, pulse 67, temperature 98.2 F (36.8 C), temperature source Oral, resp. rate 18, height 5' 11"  (1.803 m), weight 226 lb 12.8 oz (102.876 kg), SpO2 100.00%.  LABORATORY DATA: Lab Results  Component Value Date     WBC 8.2 03/02/2014   HGB 14.2 03/02/2014   HCT 42.5 03/02/2014   MCV 97.5 03/02/2014   PLT 153 03/02/2014      Chemistry      Component Value Date/Time   NA 138 03/02/2014 0924   NA 138 09/29/2012 0819   K 4.1 03/02/2014 0924   K 4.0 09/29/2012 0819   CL 105 09/29/2012 0819   CL 104 09/03/2012 1003   CO2 25 03/02/2014 0924   CO2 24 09/29/2012 0819   BUN 18.3 03/02/2014 0924   BUN 21 09/29/2012 0819   CREATININE 1.4* 03/02/2014 0924   CREATININE 1.8* 09/29/2012 0819      Component Value Date/Time   CALCIUM 9.8 03/02/2014 0924   CALCIUM 9.9 09/29/2012 0819   ALKPHOS 48 03/02/2014 0924   ALKPHOS 47 09/09/2013 1436   AST 29 03/02/2014 0924   AST 49* 09/09/2013 1436   ALT 33 03/02/2014 0924   ALT 64* 09/09/2013 1436   BILITOT 0.88 03/02/2014 0924   BILITOT 1.1 09/09/2013 1436       RADIOGRAPHIC STUDIES: Ct Chest Wo Contrast  03/02/2014   CLINICAL DATA:  History of lung cancer diagnosed in 2013. Status post left lower lobectomy. Chemotherapy now complete.  EXAM: CT CHEST WITHOUT CONTRAST  TECHNIQUE: Multidetector CT imaging of the chest was performed following the standard protocol without IV contrast.  COMPARISON:  Chest CT 09/01/2013.  FINDINGS: Mediastinum: Heart size is normal. There is no significant pericardial fluid, thickening or pericardial calcification. No pathologically enlarged mediastinal or hilar lymph nodes. Please note that accurate exclusion of hilar adenopathy is limited on noncontrast CT scans. Esophagus is unremarkable in appearance.  Lungs/Pleura: Status post left lower lobectomy. Compensatory hyperexpansion of the left upper lobe. Mild chronic postoperative scarring in the base of the left upper lobe. 7 mm ground-glass attenuation nodule in the right upper lobe (image 27 of series 5) is unchanged. 3 mm nodule in the apex of the right upper lobe (image 15 of series 5), and 2 mm subpleural nodule in the superior segment of the right lower lobe (image 28 of series 5) are also unchanged.  Upper Abdomen: Again  noted are several well-defined low-attenuation hepatic lesions which are unchanged in size, number and distribution compared to prior examinations, largest of which measures 2.3 x 2.0 cm in segment 2 of the liver; although incompletely characterized, these are presumably benign lesions such as small cysts. Numerous colonic diverticulae are noted in the transverse colon. Small gastric diverticulum also incidentally noted.  Musculoskeletal: There are no aggressive appearing lytic or blastic lesions noted in the visualized portions of the skeleton.  IMPRESSION: 1. Status post left lower lobectomy. No findings to suggest local recurrence of disease or definite metastatic disease in the thorax. 2. Tiny pulmonary nodules in the right lung are all unchanged in retrospect compared to prior study 09/02/2012 when measured in a similar fashion. These are favored to be benign. 3.  Additional incidental findings, as above.   Electronically Signed   By: [name] M.D.   On: 03/02/2014 13:14   ASSESSMENT AND PLAN:  This is a very pleasant 67 years old white male with history of stage IIA non-small cell lung cancer status post left lower lobectomy followed by 4 cycles of adjuvant chemotherapy with cisplatin and Alimta and has been observation with no evidence for disease recurrence. Doing fine today with no specific complaints. I discussed the scan results with the patient. I recommended for him to continue on observation with repeat CT scan of the chest without contrast in 6 months. The patient was advised to call immediately if he has any concerning symptoms in the interval. The patient voices understanding of current disease status and treatment options and is in agreement with the current care plan.  All questions were answered. The patient knows to call the clinic with any problems, questions or concerns. We can certainly see the patient much sooner if necessary.  Disclaimer: This note was dictated with voice  recognition software. Similar sounding words can inadvertently be transcribed and may not be corrected upon review.

## 2014-06-22 ENCOUNTER — Telehealth: Payer: Self-pay | Admitting: Internal Medicine

## 2014-06-22 NOTE — Telephone Encounter (Signed)
pt called and r/s appt...done...pt ok and aware

## 2014-08-05 ENCOUNTER — Other Ambulatory Visit (INDEPENDENT_AMBULATORY_CARE_PROVIDER_SITE_OTHER): Payer: Commercial Managed Care - HMO

## 2014-08-05 ENCOUNTER — Ambulatory Visit (INDEPENDENT_AMBULATORY_CARE_PROVIDER_SITE_OTHER): Payer: Commercial Managed Care - HMO | Admitting: Internal Medicine

## 2014-08-05 ENCOUNTER — Encounter: Payer: Self-pay | Admitting: Internal Medicine

## 2014-08-05 ENCOUNTER — Ambulatory Visit (INDEPENDENT_AMBULATORY_CARE_PROVIDER_SITE_OTHER)
Admission: RE | Admit: 2014-08-05 | Discharge: 2014-08-05 | Disposition: A | Discharge status: Home / Self Care | Payer: Commercial Managed Care - HMO | Source: Ambulatory Visit | Attending: Internal Medicine | Admitting: Internal Medicine

## 2014-08-05 VITALS — BP 170/110 | HR 122 | Temp 99.0°F | Ht 71.0 in | Wt 223.0 lb

## 2014-08-05 DIAGNOSIS — R000 Tachycardia, unspecified: Secondary | ICD-10-CM | POA: Diagnosis not present

## 2014-08-05 DIAGNOSIS — J209 Acute bronchitis, unspecified: Secondary | ICD-10-CM | POA: Diagnosis not present

## 2014-08-05 DIAGNOSIS — I10 Essential (primary) hypertension: Secondary | ICD-10-CM

## 2014-08-05 DIAGNOSIS — R05 Cough: Secondary | ICD-10-CM | POA: Diagnosis not present

## 2014-08-05 LAB — CBC WITH DIFFERENTIAL/PLATELET
BASOS ABS: 0 10*3/uL (ref 0.0–0.1)
BASOS PCT: 0.4 % (ref 0.0–3.0)
Eosinophils Absolute: 0.2 10*3/uL (ref 0.0–0.7)
Eosinophils Relative: 1.5 % (ref 0.0–5.0)
HEMATOCRIT: 42.5 % (ref 39.0–52.0)
HEMOGLOBIN: 14.4 g/dL (ref 13.0–17.0)
LYMPHS ABS: 2.8 10*3/uL (ref 0.7–4.0)
Lymphocytes Relative: 27.5 % (ref 12.0–46.0)
MCHC: 33.7 g/dL (ref 30.0–36.0)
MCV: 95.8 fl (ref 78.0–100.0)
MONOS PCT: 8.9 % (ref 3.0–12.0)
Monocytes Absolute: 0.9 10*3/uL (ref 0.1–1.0)
NEUTROS ABS: 6.3 10*3/uL (ref 1.4–7.7)
Neutrophils Relative %: 61.7 % (ref 43.0–77.0)
Platelets: 140 10*3/uL — ABNORMAL LOW (ref 150.0–400.0)
RBC: 4.44 Mil/uL (ref 4.22–5.81)
RDW: 13.2 % (ref 11.5–15.5)
WBC: 10.2 10*3/uL (ref 4.0–10.5)

## 2014-08-05 LAB — BASIC METABOLIC PANEL
BUN: 21 mg/dL (ref 6–23)
CALCIUM: 9.3 mg/dL (ref 8.4–10.5)
CO2: 24 meq/L (ref 19–32)
CREATININE: 1.6 mg/dL — AB (ref 0.4–1.5)
Chloride: 100 mEq/L (ref 96–112)
GFR: 46.96 mL/min — AB (ref >60.00)
Glucose, Bld: 120 mg/dL — ABNORMAL HIGH (ref 70–99)
Potassium: 3.7 mEq/L (ref 3.5–5.1)
Sodium: 134 mEq/L — ABNORMAL LOW (ref 135–145)

## 2014-08-05 LAB — TROPONIN I: TNIDX: 0 ug/l (ref 0.00–0.06)

## 2014-08-05 LAB — T4, FREE: Free T4: 0.92 ng/dL (ref 0.60–1.60)

## 2014-08-05 LAB — TSH: TSH: 2.34 u[IU]/mL (ref 0.35–4.50)

## 2014-08-05 LAB — MAGNESIUM: MAGNESIUM: 2 mg/dL (ref 1.5–2.5)

## 2014-08-05 MED ORDER — AZITHROMYCIN 250 MG PO TABS: ORAL_TABLET | ORAL | Status: DC

## 2014-08-05 MED ORDER — METOPROLOL TARTRATE 25 MG PO TABS: 25.0000 mg | ORAL_TABLET | Freq: Two times a day (BID) | ORAL | Status: DC

## 2014-08-05 MED ORDER — PREDNISONE 20 MG PO TABS: 20.0000 mg | ORAL_TABLET | Freq: Two times a day (BID) | ORAL | Status: DC

## 2014-08-05 MED ORDER — HYDROCODONE-HOMATROPINE 5-1.5 MG/5ML PO SYRP: 5.0000 mL | ORAL_SOLUTION | Freq: Four times a day (QID) | ORAL | Status: DC | PRN

## 2014-08-05 NOTE — Progress Notes (Signed)
   Subjective:    Patient ID: Kyle Long, male    DOB: Apr 07, 1946, 69 y.o.   MRN: [id number]  HPI  Symptoms began 08/01/13 as sore throat and hacking paroxysmal cough. The cough occurs every 5 minutes & is brief. There's been no sputum production or hemoptysis  He's been using cough drops, Tylenol, NyQuil, and over-the-counter cough syrup with no benefit  He has recently developed itchy, watery eyes. He has no other upper respiratory tract or extrinsic symptoms.  Significant history includes lung cancer.  He was 2 pack-a-day smoker for 20 years; he quit 1981.  He is not on an ACE-I. He is on ARB & rate limiting CCB.  His blood pressures at home have been in the range of 130/70-142/82. He does restrict salt. His been exercising on a treadmill for 2 X/week at a rate of 4 miles per hour. He has no cardiopulmonary symptoms with that.  Review of Systems Frontal headache, facial pain , nasal purulence, dental pain, sore throat , otic pain or otic discharge denied. No fever , chills or sweats.  Chest pain, palpitations, tachycardia (until today), exertional dyspnea, paroxysmal nocturnal dyspnea, claudication or edema are absent.      Objective:   Physical Exam  Pertinent or positive findings include: Pattern alopecia. Appears somewhat fatigued but in no acute distress. Repeat blood pressure was 130/95. He has resting tachycardia @ rate of approximately 115. Breath sounds are slightly decreased Deep tendon reflexes are 1/2+ at the right knee; 1+ of left knee.  HEENT eaxm negative. No carotid bruits are present.No neck vein distention present at 10 - 15 degrees. Thyroid normal to palpation Chest is clear with no increased work of breathing There is no evidence of aortic aneurysm or renal artery bruits Abdomen soft but protuberant with no organomegaly or masses. No HJR No clubbing, cyanosis or edema present. Pedal pulses are intact  No ischemic skin changes are present .  Fingernails healthy  Alert and oriented. Strength, tone normal         Assessment & Plan:  #1 acute bronchitis  #2 tachycardia. There are no ischemic ST-T changes.  #3 hypertension  Plan: See orders recommendations

## 2014-08-05 NOTE — Progress Notes (Signed)
Pre visit review using our clinic review tool, if applicable. No additional management support is needed unless otherwise documented below in the visit note. 

## 2014-08-05 NOTE — Patient Instructions (Signed)
Carry room temperature water and sip liberally after coughing. Your next office appointment will be determined based upon review of your pending labs & x-rays. Those instructions will be transmitted to you through My Chart  Followup as needed for your acute issue. Please report any significant change in your symptoms. To ER if having shortness of breath or chest pain.

## 2014-08-06 ENCOUNTER — Other Ambulatory Visit: Payer: Self-pay | Admitting: Internal Medicine

## 2014-08-06 ENCOUNTER — Encounter: Payer: Self-pay | Admitting: Internal Medicine

## 2014-08-06 DIAGNOSIS — I10 Essential (primary) hypertension: Secondary | ICD-10-CM

## 2014-08-06 MED ORDER — DILTIAZEM HCL ER COATED BEADS 240 MG PO CP24: 240.0000 mg | ORAL_CAPSULE | Freq: Every day | ORAL | Status: DC

## 2014-08-06 MED ORDER — LOSARTAN POTASSIUM 100 MG PO TABS
50.0000 mg | ORAL_TABLET | Freq: Every day | ORAL | Status: DC
Start: 2014-08-06 — End: 2014-09-14

## 2014-08-06 MED ORDER — LOSARTAN POTASSIUM 100 MG PO TABS: 50.0000 mg | ORAL_TABLET | Freq: Every day | ORAL | Status: DC

## 2014-08-06 NOTE — Addendum Note (Signed)
Addended by: Roma Schanz R on: 08/06/2014 01:21 PM   Modules accepted: Orders

## 2014-08-12 ENCOUNTER — Other Ambulatory Visit: Payer: Self-pay | Admitting: Internal Medicine

## 2014-08-12 ENCOUNTER — Encounter: Payer: Self-pay | Admitting: Internal Medicine

## 2014-08-12 DIAGNOSIS — R739 Hyperglycemia, unspecified: Secondary | ICD-10-CM

## 2014-08-13 ENCOUNTER — Other Ambulatory Visit: Payer: Self-pay | Admitting: Internal Medicine

## 2014-08-13 DIAGNOSIS — J209 Acute bronchitis, unspecified: Secondary | ICD-10-CM

## 2014-08-14 ENCOUNTER — Other Ambulatory Visit: Payer: Self-pay | Admitting: Internal Medicine

## 2014-08-14 DIAGNOSIS — J209 Acute bronchitis, unspecified: Secondary | ICD-10-CM

## 2014-08-14 MED ORDER — HYDROCODONE-HOMATROPINE 5-1.5 MG/5ML PO SYRP: 5.0000 mL | ORAL_SOLUTION | Freq: Four times a day (QID) | ORAL | Status: DC | PRN

## 2014-08-16 ENCOUNTER — Other Ambulatory Visit (INDEPENDENT_AMBULATORY_CARE_PROVIDER_SITE_OTHER): Payer: Commercial Managed Care - HMO

## 2014-08-16 DIAGNOSIS — R739 Hyperglycemia, unspecified: Secondary | ICD-10-CM

## 2014-08-16 LAB — HEMOGLOBIN A1C: Hgb A1c MFr Bld: 5.7 % (ref 4.6–6.5)

## 2014-09-06 ENCOUNTER — Encounter (HOSPITAL_COMMUNITY): Payer: Self-pay

## 2014-09-06 ENCOUNTER — Telehealth: Payer: Self-pay | Admitting: *Deleted

## 2014-09-06 ENCOUNTER — Other Ambulatory Visit (HOSPITAL_BASED_OUTPATIENT_CLINIC_OR_DEPARTMENT_OTHER): Payer: Commercial Managed Care - HMO

## 2014-09-06 ENCOUNTER — Ambulatory Visit (HOSPITAL_COMMUNITY)
Admission: RE | Admit: 2014-09-06 | Discharge: 2014-09-06 | Disposition: A | Discharge status: Home / Self Care | Payer: Commercial Managed Care - HMO | Source: Ambulatory Visit | Attending: Internal Medicine | Admitting: Internal Medicine

## 2014-09-06 DIAGNOSIS — Z85118 Personal history of other malignant neoplasm of bronchus and lung: Secondary | ICD-10-CM

## 2014-09-06 DIAGNOSIS — C3492 Malignant neoplasm of unspecified part of left bronchus or lung: Secondary | ICD-10-CM | POA: Diagnosis not present

## 2014-09-06 DIAGNOSIS — Z9221 Personal history of antineoplastic chemotherapy: Secondary | ICD-10-CM | POA: Diagnosis not present

## 2014-09-06 DIAGNOSIS — R918 Other nonspecific abnormal finding of lung field: Secondary | ICD-10-CM | POA: Diagnosis not present

## 2014-09-06 LAB — CBC WITH DIFFERENTIAL/PLATELET
BASO%: 0.3 % (ref 0.0–2.0)
Basophils Absolute: 0 10*3/uL (ref 0.0–0.1)
EOS%: 1.3 % (ref 0.0–7.0)
Eosinophils Absolute: 0.1 10*3/uL (ref 0.0–0.5)
HCT: 38.1 % — ABNORMAL LOW (ref 38.4–49.9)
HGB: 12.7 g/dL — ABNORMAL LOW (ref 13.0–17.1)
LYMPH#: 4 10*3/uL — AB (ref 0.9–3.3)
LYMPH%: 44.4 % (ref 14.0–49.0)
MCH: 31.4 pg (ref 27.2–33.4)
MCHC: 33.3 g/dL (ref 32.0–36.0)
MCV: 94.3 fL (ref 79.3–98.0)
MONO#: 0.6 10*3/uL (ref 0.1–0.9)
MONO%: 6.4 % (ref 0.0–14.0)
NEUT%: 47.6 % (ref 39.0–75.0)
NEUTROS ABS: 4.3 10*3/uL (ref 1.5–6.5)
PLATELETS: 234 10*3/uL (ref 140–400)
RBC: 4.04 10*6/uL — ABNORMAL LOW (ref 4.20–5.82)
RDW: 12.8 % (ref 11.0–14.6)
WBC: 9 10*3/uL (ref 4.0–10.3)

## 2014-09-06 LAB — COMPREHENSIVE METABOLIC PANEL (CC13)
ALBUMIN: 3.6 g/dL (ref 3.5–5.0)
ALT: 64 U/L — ABNORMAL HIGH (ref 0–55)
AST: 35 U/L — AB (ref 5–34)
Alkaline Phosphatase: 63 U/L (ref 40–150)
Anion Gap: 11 mEq/L (ref 3–11)
BUN: 18.4 mg/dL (ref 7.0–26.0)
CALCIUM: 9.7 mg/dL (ref 8.4–10.4)
CO2: 26 mEq/L (ref 22–29)
Chloride: 101 mEq/L (ref 98–109)
Creatinine: 1.3 mg/dL (ref 0.7–1.3)
EGFR: 54 mL/min/{1.73_m2} — AB (ref >90)
Glucose: 108 mg/dl (ref 70–140)
Potassium: 4.1 mEq/L (ref 3.5–5.1)
SODIUM: 138 meq/L (ref 136–145)
Total Bilirubin: 0.51 mg/dL (ref 0.20–1.20)
Total Protein: 7.4 g/dL (ref 6.4–8.3)

## 2014-09-06 NOTE — Telephone Encounter (Signed)
Received call from [facility] with CT scan results from today.  Will route to Dr. Julien Nordmann and desk RN.

## 2014-09-07 ENCOUNTER — Telehealth: Payer: Self-pay | Admitting: Internal Medicine

## 2014-09-07 ENCOUNTER — Ambulatory Visit (HOSPITAL_BASED_OUTPATIENT_CLINIC_OR_DEPARTMENT_OTHER): Payer: Commercial Managed Care - HMO | Admitting: Internal Medicine

## 2014-09-07 ENCOUNTER — Encounter: Payer: Self-pay | Admitting: Internal Medicine

## 2014-09-07 VITALS — BP 160/85 | HR 69 | Temp 98.4°F | Resp 18 | Ht 71.0 in | Wt 217.4 lb

## 2014-09-07 DIAGNOSIS — R591 Generalized enlarged lymph nodes: Secondary | ICD-10-CM

## 2014-09-07 DIAGNOSIS — R590 Localized enlarged lymph nodes: Secondary | ICD-10-CM

## 2014-09-07 DIAGNOSIS — Z85118 Personal history of other malignant neoplasm of bronchus and lung: Secondary | ICD-10-CM

## 2014-09-07 NOTE — Progress Notes (Signed)
[facility] [phone]   [phone]  OFFICE PROGRESS NOTE  [name], MD [address] [place] [address]  DIAGNOSIS AND STAGE: Stage IIA (T1a, N1, M0) non-small cell lung cancer consistent with adenocarcinoma with negative EGFR mutation diagnosed in March of 2013.   PRIOR THERAPY:  1) S/P left video-assisted thoracoscopy, wedge resection of left lower lobe nodule, thoracoscopic left lower lobectomy, mediastinal lymph node dissection on 11/02/2011.  2) Adjuvant chemotherapy with cisplatin 75 mg/M2 and Alimta 500 mg/M2 every 3 weeks. She is status post 4 cycles.   CURRENT THERAPY: Observation.  CHEMOTHERAPY INTENT: Curative/adjuvant  CURRENT # OF CHEMOTHERAPY CYCLES: 0  CURRENT ANTIEMETICS: Compazine  CURRENT SMOKING STATUS: Currently nonsmoker  ORAL CHEMOTHERAPY AND CONSENT: None  CURRENT BISPHOSPHONATES USE: None  PAIN MANAGEMENT: 0/10  NARCOTICS INDUCED CONSTIPATION: N/A  LIVING WILL AND CODE STATUS: Full code initially.   INTERVAL HISTORY: Kyle Long 69 y.o. male returns to the clinic today for six-month followup visit. He has been observation for the last 3 years. The patient is feeling fine today with no specific complaints. He denied having any significant chest pain, shortness of breath or hemoptysis. He denied having any weight loss or night sweats. The patient has no nausea or vomiting. He had repeat CT scan of the chest performed recently and he is here for evaluation and discussion of his scan results.  MEDICAL HISTORY: Past Medical History  Diagnosis Date  . Hypertension   . Peyronie disease   . Gilbert's syndrome   . Hyperlipidemia   . Amnesia 2009    isolated;for 30 mins w/elevated BP  . Hematuria 2009    Dr Burnell Blanks  . Hyperglycemia   . Diverticulosis of colon   . Cellulitis 2011    LUE (no PMH of MRSA)/left elbow  . Peptic ulcer 1995  . Recurrent upper respiratory infection  (URI)     09/2011  BRONCHITIS  . GERD (gastroesophageal reflux disease)     OCC HEARTBURN  . Neuromuscular disorder     2010 TEMPORARY MEMORY LOSS 1-1.5 HR WAS CHECKED OUT AT [facility] , NOTHING FOUND  . Arthritis   . Cancer     skin / top head  . Lung cancer, lower lobe     Stage IIA (T1,N1)    ALLERGIES:  is allergic to lisinopril.  MEDICATIONS:  Current Outpatient Prescriptions  Medication Sig Dispense Refill  . diltiazem (CARDIZEM CD) 240 MG 24 hr capsule Take 1 capsule (240 mg total) by mouth daily. 90 capsule 1  . losartan (COZAAR) 100 MG tablet Take 0.5 tablets (50 mg total) by mouth daily. 45 tablet 1  . metoprolol tartrate (LOPRESSOR) 25 MG tablet Take 1 tablet (25 mg total) by mouth 2 (two) times daily. 60 tablet 2   No current facility-administered medications for this visit.    SURGICAL HISTORY:  Past Surgical History  Procedure Laterality Date  . Colonoscopy w/ polypectomy  2005    due 2015  . Vasectomy    . Cystoscopy  2009  . Vein surgery      R ankle  . Lobectomy  11/02/2011    Procedure: LOBECTOMY;  Surgeon: Melrose Nakayama, MD;  Location: [facility];  Service: Thoracic;  Laterality: Left;  LEFT LOWER LOBECTOMY  . Refractive surgery      Bil  . Chemo treatment       4 treatments that last 7 hours each/last treatmebt in  2013    REVIEW OF SYSTEMS:  A comprehensive review of systems was negative.   PHYSICAL EXAMINATION: General appearance: alert, cooperative and no distress Head: Normocephalic, without obvious abnormality, atraumatic Neck: no adenopathy Lymph nodes: Cervical, supraclavicular, and axillary nodes normal. Resp: clear to auscultation bilaterally Cardio: regular rate and rhythm, S1, S2 normal, no murmur, click, rub or gallop GI: soft, non-tender; bowel sounds normal; no masses,  no organomegaly Extremities: extremities normal, atraumatic, no cyanosis or edema  ECOG PERFORMANCE STATUS: 0 - Asymptomatic  Blood pressure 160/85, pulse 69,  temperature 98.4 F (36.9 C), temperature source Oral, resp. rate 18, height _0  (1.803 m), weight 217 lb 6.4 oz (98.612 kg).  LABORATORY DATA: Lab Results  Component Value Date   WBC 9.0 09/06/2014   HGB 12.7* 09/06/2014   HCT 38.1* 09/06/2014   MCV 94.3 09/06/2014   PLT 234 09/06/2014      Chemistry      Component Value Date/Time   NA 138 09/06/2014 0928   NA 134* 08/05/2014 1553   K 4.1 09/06/2014 0928   K 3.7 08/05/2014 1553   CL 100 08/05/2014 1553   CL 104 09/03/2012 1003   CO2 26 09/06/2014 0928   CO2 24 08/05/2014 1553   BUN 18.4 09/06/2014 0928   BUN 21 08/05/2014 1553   CREATININE 1.3 09/06/2014 0928   CREATININE 1.6* 08/05/2014 1553      Component Value Date/Time   CALCIUM 9.7 09/06/2014 0928   CALCIUM 9.3 08/05/2014 1553   ALKPHOS 63 09/06/2014 0928   ALKPHOS 47 09/09/2013 1436   AST 35* 09/06/2014 0928   AST 49* 09/09/2013 1436   ALT 64* 09/06/2014 0928   ALT 64* 09/09/2013 1436   BILITOT 0.51 09/06/2014 0928   BILITOT 1.1 09/09/2013 1436       RADIOGRAPHIC STUDIES: Ct Chest Wo Contrast  09/06/2014   CLINICAL DATA:  Lung cancer diagnosed 3/13. Chemotherapy complete. Partial left lower lobectomy. Restaging.  EXAM: CT CHEST WITHOUT CONTRAST  TECHNIQUE: Multidetector CT imaging of the chest was performed following the standard protocol without IV contrast.  COMPARISON:  Plain film 08/05/2014.  Most recent CT of 03/02/2014.  FINDINGS: Mediastinum/Nodes: Redemonstration of multiple small low left greater than right cervical nodes. A left supraclavicular node measures 11 mm on image 7 versus 9 mm on the prior.  Right-sided gynecomastia. Multiple left greater than right axillary and subpectoral nodes. Index left subpectoral node measures 11 mm on image 19 versus 9 mm on the prior exam.  Aortic and branch vessel atherosclerosis. Mild cardiomegaly. Proximal LAD coronary artery atherosclerosis. Small middle mediastinal nodes, which are not pathologic by size  criteria. Hilar regions poorly evaluated without intravenous contrast. Small prevascular nodes which are similar and also not pathologic by size criteria.  Retrocrural nodes which measure up to 9 mm on image 63 versus 6 mm on the prior exam.  Lungs/Pleura: Similar right upper lobe nodule or nodules on image 30.  A tiny small linear right upper lobe nodule on image 39 is not significantly changed.  Tiny subpleural right lower lobe nodule of on image 30 is unchanged.  Status post left lower lobectomy, without locally recurrent disease.  Trace left pleural thickening, without pleural effusion.  Upper abdomen: Hepatic cysts. Normal imaged portions of the spleen, adrenal glands, kidneys. A small gastric diverticulum. Normal imaged portions of the pancreas, gallbladder.  Musculoskeletal: Left-sided thoracotomy changes. Lucency within the right-sided the T12 vertebral body is present over prior exams and favored to represent  a small hemangioma.  IMPRESSION: 1. Status post left lower lobectomy, without locally recurrent disease. 2. Increased number and size of low cervical and axillary/subpectoral nodes. Not typical distribution pattern for recurrent or metastatic lung cancer. Suspicious for lymphoproliferative processes such as lymphoma. Consider PET to direct tissue sampling. These results will be called to the ordering clinician or representative by the Radiologist Assistant, and communication documented in the PACS or zVision Dashboard. 3.  Atherosclerosis, including within the coronary arteries.   Electronically Signed   By: Abigail Miyamoto M.D.   On: 09/06/2014 10:18   ASSESSMENT AND PLAN:  This is a very pleasant 69 years old white male with history of stage IIA non-small cell lung cancer status post left lower lobectomy followed by 4 cycles of adjuvant chemotherapy with cisplatin and Alimta and has been observation with no evidence for disease recurrence. The recent CT scan of the chest showed no evidence for  disease recurrence from the lung cancer but that was prominent lower cervical as well as axillary/subpectoral lymphadenopathy suspicious for myeloproliferative disorder. I discussed the scan results with the patient today. I recommended for him to have a PET scan for further evaluation of these lymph nodes. I will see him back for follow-up visit in 3 weeks for reevaluation and discussion of the PET scan and arrangement for a biopsy if needed. The patient was advised to call immediately if he has any concerning symptoms in the interval. The patient voices understanding of current disease status and treatment options and is in agreement with the current care plan.  All questions were answered. The patient knows to call the clinic with any problems, questions or concerns. We can certainly see the patient much sooner if necessary.  Disclaimer: This note was dictated with voice recognition software. Similar sounding words can inadvertently be transcribed and may not be corrected upon review.

## 2014-09-07 NOTE — Telephone Encounter (Signed)
Pt confirmed labs/ov per 02/09 POF, gave pt AVS.... KJ

## 2014-09-13 ENCOUNTER — Ambulatory Visit: Payer: Medicare HMO | Admitting: Internal Medicine

## 2014-09-14 ENCOUNTER — Ambulatory Visit (INDEPENDENT_AMBULATORY_CARE_PROVIDER_SITE_OTHER): Payer: Commercial Managed Care - HMO | Admitting: Family Medicine

## 2014-09-14 ENCOUNTER — Encounter: Payer: Self-pay | Admitting: Family Medicine

## 2014-09-14 VITALS — BP 130/72 | HR 80 | Temp 98.0°F | Ht 70.5 in | Wt 218.4 lb

## 2014-09-14 DIAGNOSIS — M109 Gout, unspecified: Secondary | ICD-10-CM | POA: Diagnosis not present

## 2014-09-14 DIAGNOSIS — R000 Tachycardia, unspecified: Secondary | ICD-10-CM | POA: Diagnosis not present

## 2014-09-14 DIAGNOSIS — N289 Disorder of kidney and ureter, unspecified: Secondary | ICD-10-CM | POA: Diagnosis not present

## 2014-09-14 DIAGNOSIS — R739 Hyperglycemia, unspecified: Secondary | ICD-10-CM

## 2014-09-14 DIAGNOSIS — E782 Mixed hyperlipidemia: Secondary | ICD-10-CM

## 2014-09-14 DIAGNOSIS — I10 Essential (primary) hypertension: Secondary | ICD-10-CM | POA: Diagnosis not present

## 2014-09-14 MED ORDER — METOPROLOL TARTRATE 25 MG PO TABS: 25.0000 mg | ORAL_TABLET | Freq: Two times a day (BID) | ORAL | Status: DC

## 2014-09-14 MED ORDER — LOSARTAN POTASSIUM 100 MG PO TABS: 50.0000 mg | ORAL_TABLET | Freq: Every day | ORAL | Status: DC

## 2014-09-14 NOTE — Progress Notes (Signed)
Pre visit review using our clinic review tool, if applicable. No additional management support is needed unless otherwise documented below in the visit note. 

## 2014-09-14 NOTE — Patient Instructions (Addendum)
Tylenol/Acetaminophen 500 mg ES tabs 2 twice daily and up to 6 tabs or 3000 mg in 24 hours for severe pain  Salon Pas gel to wrist twice daily  Gout Gout is an inflammatory arthritis caused by a buildup of uric acid crystals in the joints. Uric acid is a chemical that is normally present in the blood. When the level of uric acid in the blood is too high it can form crystals that deposit in your joints and tissues. This causes joint redness, soreness, and swelling (inflammation). Repeat attacks are common. Over time, uric acid crystals can form into masses (tophi) near a joint, destroying bone and causing disfigurement. Gout is treatable and often preventable. CAUSES  The disease begins with elevated levels of uric acid in the blood. Uric acid is produced by your body when it breaks down a naturally found substance called purines. Certain foods you eat, such as meats and fish, contain high amounts of purines. Causes of an elevated uric acid level include:  Being passed down from parent to child (heredity).  Diseases that cause increased uric acid production (such as obesity, psoriasis, and certain cancers).  Excessive alcohol use.  Diet, especially diets rich in meat and seafood.  Medicines, including certain cancer-fighting medicines (chemotherapy), water pills (diuretics), and aspirin.  Chronic kidney disease. The kidneys are no longer able to remove uric acid well.  Problems with metabolism. Conditions strongly associated with gout include:  Obesity.  High blood pressure.  High cholesterol.  Diabetes. Not everyone with elevated uric acid levels gets gout. It is not understood why some people get gout and others do not. Surgery, joint injury, and eating too much of certain foods are some of the factors that can lead to gout attacks. SYMPTOMS   An attack of gout comes on quickly. It causes intense pain with redness, swelling, and warmth in a joint.  Fever can occur.  Often, only  one joint is involved. Certain joints are more commonly involved:  Base of the big toe.  Knee.  Ankle.  Wrist.  Finger. Without treatment, an attack usually goes away in a few days to weeks. Between attacks, you usually will not have symptoms, which is different from many other forms of arthritis. DIAGNOSIS  Your caregiver will suspect gout based on your symptoms and exam. In some cases, tests may be recommended. The tests may include:  Blood tests.  Urine tests.  X-rays.  Joint fluid exam. This exam requires a needle to remove fluid from the joint (arthrocentesis). Using a microscope, gout is confirmed when uric acid crystals are seen in the joint fluid. TREATMENT  There are two phases to gout treatment: treating the sudden onset (acute) attack and preventing attacks (prophylaxis).  Treatment of an Acute Attack.  Medicines are used. These include anti-inflammatory medicines or steroid medicines.  An injection of steroid medicine into the affected joint is sometimes necessary.  The painful joint is rested. Movement can worsen the arthritis.  You may use warm or cold treatments on painful joints, depending which works best for you.  Treatment to Prevent Attacks.  If you suffer from frequent gout attacks, your caregiver may advise preventive medicine. These medicines are started after the acute attack subsides. These medicines either help your kidneys eliminate uric acid from your body or decrease your uric acid production. You may need to stay on these medicines for a very long time.  The early phase of treatment with preventive medicine can be associated with an increase in  acute gout attacks. For this reason, during the first few months of treatment, your caregiver may also advise you to take medicines usually used for acute gout treatment. Be sure you understand your caregiver's directions. Your caregiver may make several adjustments to your medicine dose before these  medicines are effective.  Discuss dietary treatment with your caregiver or dietitian. Alcohol and drinks high in sugar and fructose and foods such as meat, poultry, and seafood can increase uric acid levels. Your caregiver or dietitian can advise you on drinks and foods that should be limited. HOME CARE INSTRUCTIONS   Do not take aspirin to relieve pain. This raises uric acid levels.  Only take over-the-counter or prescription medicines for pain, discomfort, or fever as directed by your caregiver.  Rest the joint as much as possible. When in bed, keep sheets and blankets off painful areas.  Keep the affected joint raised (elevated).  Apply warm or cold treatments to painful joints. Use of warm or cold treatments depends on which works best for you.  Use crutches if the painful joint is in your leg.  Drink enough fluids to keep your urine clear or pale yellow. This helps your body get rid of uric acid. Limit alcohol, sugary drinks, and fructose drinks.  Follow your dietary instructions. Pay careful attention to the amount of protein you eat. Your daily diet should emphasize fruits, vegetables, whole grains, and fat-free or low-fat milk products. Discuss the use of coffee, vitamin C, and cherries with your caregiver or dietitian. These may be helpful in lowering uric acid levels.  Maintain a healthy body weight. SEEK MEDICAL CARE IF:   You develop diarrhea, vomiting, or any side effects from medicines.  You do not feel better in 24 hours, or you are getting worse. SEEK IMMEDIATE MEDICAL CARE IF:   Your joint becomes suddenly more tender, and you have chills or a fever. MAKE SURE YOU:   Understand these instructions.  Will watch your condition.  Will get help right away if you are not doing well or get worse. Document Released: 07/13/2000 Document Revised: 11/30/2013 Document Reviewed: 02/27/2012 [facility] Patient Information 2015 [place], [place]. This information is not intended to  replace advice given to you by your health care provider. Make sure you discuss any questions you have with your health care provider.

## 2014-09-16 ENCOUNTER — Encounter: Payer: Self-pay | Admitting: Internal Medicine

## 2014-09-16 NOTE — Progress Notes (Unsigned)
09/16/2014 This is a late note from yesterday.  I called the patient's house to tell him [employer] had approved his scan last week.  I could not tell him if the insurance company would send him out a letter.  I left the informaation on his VM  Kyle Long

## 2014-09-17 ENCOUNTER — Ambulatory Visit (HOSPITAL_COMMUNITY)
Admission: RE | Admit: 2014-09-17 | Discharge: 2014-09-17 | Disposition: A | Discharge status: Home / Self Care | Payer: Commercial Managed Care - HMO | Source: Ambulatory Visit | Attending: Internal Medicine | Admitting: Internal Medicine

## 2014-09-17 DIAGNOSIS — R599 Enlarged lymph nodes, unspecified: Secondary | ICD-10-CM | POA: Insufficient documentation

## 2014-09-17 DIAGNOSIS — Z85118 Personal history of other malignant neoplasm of bronchus and lung: Secondary | ICD-10-CM | POA: Insufficient documentation

## 2014-09-17 DIAGNOSIS — R590 Localized enlarged lymph nodes: Secondary | ICD-10-CM | POA: Diagnosis not present

## 2014-09-17 LAB — GLUCOSE, CAPILLARY: Glucose-Capillary: 108 mg/dL — ABNORMAL HIGH (ref 70–99)

## 2014-09-17 MED ORDER — FLUDEOXYGLUCOSE F - 18 (FDG) INJECTION
10.6000 | Freq: Once | INTRAVENOUS | Status: AC | PRN
  Administered 2014-09-17: 10.6 via INTRAVENOUS

## 2014-09-27 ENCOUNTER — Encounter: Payer: Self-pay | Admitting: Family Medicine

## 2014-09-27 DIAGNOSIS — R000 Tachycardia, unspecified: Secondary | ICD-10-CM | POA: Insufficient documentation

## 2014-09-27 DIAGNOSIS — M109 Gout, unspecified: Secondary | ICD-10-CM | POA: Insufficient documentation

## 2014-09-27 NOTE — Assessment & Plan Note (Signed)
Well controlled, no changes to meds. Encouraged heart healthy diet such as the DASH diet and exercise as tolerated.  °

## 2014-09-27 NOTE — Assessment & Plan Note (Signed)
minimize simple carbs. Increase exercise as tolerated.  

## 2014-09-27 NOTE — Assessment & Plan Note (Signed)
Maintain adequate hydration

## 2014-09-27 NOTE — Progress Notes (Signed)
Kyle Long  [id number] [birth year] 09/27/2014      Progress Note-Follow Up  Subjective  Chief Complaint  Chief Complaint  Patient presents with  . Establish Care    transfer from Dr. Linna Darner    HPI  Patient is a 69 y.o. male in today for routine medical care. Patient is in today to establish care. No recent illness or hospitalization. Denies any acute concerns but is in need of ongoing medical care secondary to numerous conditions. Denies CP/palp/SOB/HA/congestion/fevers/GI or GU c/o. Taking meds as prescribed  Past Medical History  Diagnosis Date  . Hypertension   . Peyronie disease   . Gilbert's syndrome   . Hyperlipidemia   . Amnesia 2009    isolated;for 30 mins w/elevated BP  . Hematuria 2009    Dr Burnell Blanks  . Hyperglycemia   . Diverticulosis of colon   . Cellulitis 2011    LUE (no PMH of MRSA)/left elbow  . Peptic ulcer 1995  . Recurrent upper respiratory infection (URI)     09/2011  BRONCHITIS  . GERD (gastroesophageal reflux disease)     OCC HEARTBURN  . Neuromuscular disorder     2010 TEMPORARY MEMORY LOSS 1-1.5 HR WAS CHECKED OUT AT [facility] , NOTHING FOUND  . Arthritis   . Cancer     skin / top head  . Lung cancer, lower lobe     Stage IIA (T1,N1)    Past Surgical History  Procedure Laterality Date  . Colonoscopy w/ polypectomy  2005    due 2015  . Vasectomy    . Cystoscopy  2009  . Vein surgery      R ankle  . Lobectomy  11/02/2011    Procedure: LOBECTOMY;  Surgeon: Melrose Nakayama, MD;  Location: [facility];  Service: Thoracic;  Laterality: Left;  LEFT LOWER LOBECTOMY  . Refractive surgery      Bil  . Chemo treatment       4 treatments that last 7 hours each/last treatmebt in 2013  . Skin biopsy      4 0r 5    Family History  Problem Relation Age of Onset  . Stroke Mother     in 75s  . Hypertension Mother   . Breast cancer Mother   . Neuropathy Mother   . Melanoma Father   . Heart attack Father 36    stents  . Hypertension  Sister   . Thyroid disease Sister     ??  . Multiple myeloma Sister   . Cancer Sister   . Diabetes Paternal Aunt   . Stomach cancer Maternal Grandfather   . Lung cancer Maternal Grandfather   . Cancer Paternal Grandmother     lung, nonsmoker  . Headache Daughter     History   Social History  . Marital Status: Married    Spouse Name: N/A  . Number of Children: N/A  . Years of Education: N/A   Occupational History  . retired    Social History Main Topics  . Smoking status: Former Smoker -- 1.00 packs/day for 15 years    Quit date: 07/31/1979  . Smokeless tobacco: Never Used     Comment: Smoked age 67 - 6 , up to 1 ppd  . Alcohol Use: 6.6 oz/week    2 Glasses of wine, 9 Shots of liquor per week  . Drug Use: No  . Sexual Activity: Not on file     Comment: lives with wife, retired from [employer], ran distribution. No dietary restrictions.   Other Topics Concern  . Not on file   Social History Narrative   Reg exercise    Current Outpatient Prescriptions on File Prior to Visit  Medication Sig Dispense Refill  . diltiazem (CARDIZEM CD) 240 MG 24 hr capsule Take 1 capsule (240 mg total) by mouth daily. 90 capsule 1   No current facility-administered medications on file prior to visit.    Allergies  Allergen Reactions  . Lisinopril     cough    Review of Systems  Review of Systems  Constitutional: Negative for fever, chills and malaise/fatigue.  HENT: Negative for congestion, hearing loss and nosebleeds.   Eyes: Negative for discharge.  Respiratory: Negative for cough, sputum production, shortness of breath and wheezing.   Cardiovascular: Negative for chest pain, palpitations and leg swelling.  Gastrointestinal: Negative for heartburn, nausea, vomiting, abdominal pain, diarrhea, constipation and blood in stool.  Genitourinary: Negative for dysuria, urgency, frequency and hematuria.  Musculoskeletal: Negative for myalgias, back pain and falls.  Skin:  Negative for rash.  Neurological: Negative for dizziness, tremors, sensory change, focal weakness, loss of consciousness, weakness and headaches.  Endo/Heme/Allergies: Negative for polydipsia. Does not bruise/bleed easily.  Psychiatric/Behavioral: Negative for depression and suicidal ideas. The patient is not nervous/anxious and does not have insomnia.     Objective  BP 130/72 mmHg  Pulse 80  Temp(Src) 98 F (36.7 C) (Oral)  Ht 5' 10.5" (1.791 m)  Wt 218 lb 6 oz (99.054 kg)  BMI 30.88 kg/m2  SpO2 97%  Physical Exam  Physical Exam  Constitutional: He is oriented to person, place, and time and well-developed, well-nourished, and in no distress. No distress.  HENT:  Head: Normocephalic and atraumatic.  Eyes: Conjunctivae are normal.  Neck: Neck supple. No thyromegaly present.  Cardiovascular: Normal rate, regular rhythm and normal heart sounds.   Pulmonary/Chest: Effort normal and breath sounds normal. No respiratory distress.  Abdominal: He exhibits no distension and no mass. There is no tenderness.  Musculoskeletal: He exhibits no edema.  Neurological: He is alert and oriented to person, place, and time.  Skin: Skin is warm.  Psychiatric: Memory, affect and judgment normal.    Lab Results  Component Value Date   TSH 2.34 08/05/2014   Lab Results  Component Value Date   WBC 9.0 09/06/2014   HGB 12.7* 09/06/2014   HCT 38.1* 09/06/2014   MCV 94.3 09/06/2014   PLT 234 09/06/2014   Lab Results  Component Value Date   CREATININE 1.3 09/06/2014   BUN 18.4 09/06/2014   NA 138 09/06/2014   K 4.1 09/06/2014   CL 100 08/05/2014   CO2 26 09/06/2014   Lab Results  Component Value Date   ALT 64* 09/06/2014   AST 35* 09/06/2014   ALKPHOS 63 09/06/2014   BILITOT 0.51 09/06/2014   Lab Results  Component Value Date   CHOL 148 09/09/2013   Lab Results  Component Value Date   HDL 35.30* 09/09/2013   Lab Results  Component Value Date   LDLCALC 80 09/09/2013   Lab  Results  Component Value Date   TRIG 165.0* 09/09/2013   Lab Results  Component Value Date   CHOLHDL 4 09/09/2013     Assessment & Plan  Hyperlipidemia, mixed encouraged heart healthy diet, avoid trans fats, minimize simple carbs and saturated fats. Increase exercise as tolerated   Essential hypertension Well controlled, no changes to meds. Encouraged heart healthy diet such as the  DASH diet and exercise as tolerated.    Hyperglycemia minimize simple carbs. Increase exercise as tolerated.    Renal insufficiency Will monitor, maintain adequate hydration   Tachycardia RRR on recheck.    Gout Maintain adequate hydration

## 2014-09-27 NOTE — Assessment & Plan Note (Signed)
Will monitor, maintain adequate hydration

## 2014-09-27 NOTE — Assessment & Plan Note (Signed)
RRR on recheck.

## 2014-09-27 NOTE — Assessment & Plan Note (Addendum)
encouraged heart healthy diet, avoid trans fats, minimize simple carbs and saturated fats. Increase exercise as tolerated 

## 2014-09-28 ENCOUNTER — Ambulatory Visit (HOSPITAL_BASED_OUTPATIENT_CLINIC_OR_DEPARTMENT_OTHER): Payer: Commercial Managed Care - HMO | Admitting: Internal Medicine

## 2014-09-28 ENCOUNTER — Telehealth: Payer: Self-pay | Admitting: Internal Medicine

## 2014-09-28 ENCOUNTER — Encounter: Payer: Self-pay | Admitting: Internal Medicine

## 2014-09-28 VITALS — BP 160/85 | HR 72 | Temp 98.7°F | Resp 19 | Ht 70.0 in | Wt 221.6 lb

## 2014-09-28 DIAGNOSIS — C3432 Malignant neoplasm of lower lobe, left bronchus or lung: Secondary | ICD-10-CM | POA: Diagnosis not present

## 2014-09-28 DIAGNOSIS — C778 Secondary and unspecified malignant neoplasm of lymph nodes of multiple regions: Secondary | ICD-10-CM | POA: Diagnosis not present

## 2014-09-28 NOTE — Telephone Encounter (Signed)
Pt confirmed labs/ov per 03/01 POF, gave pt AVS... KJ, sent msg to send medical records for referral

## 2014-09-28 NOTE — Progress Notes (Signed)
[facility] [phone]   [phone]  OFFICE PROGRESS NOTE  [name], MD [address] [place] [address]  DIAGNOSIS AND STAGE:  1) questionable myeloproliferative disorder. 2) Stage IIA (T1a, N1, M0) non-small cell lung cancer consistent with adenocarcinoma with negative EGFR mutation diagnosed in March of 2013.   PRIOR THERAPY:  1) S/P left video-assisted thoracoscopy, wedge resection of left lower lobe nodule, thoracoscopic left lower lobectomy, mediastinal lymph node dissection on 11/02/2011.  2) Adjuvant chemotherapy with cisplatin 75 mg/M2 and Alimta 500 mg/M2 every 3 weeks. She is status post 4 cycles.   CURRENT THERAPY: Observation.  CHEMOTHERAPY INTENT: Curative/adjuvant  CURRENT # OF CHEMOTHERAPY CYCLES: 0  CURRENT ANTIEMETICS: Compazine  CURRENT SMOKING STATUS: Currently nonsmoker  ORAL CHEMOTHERAPY AND CONSENT: None  CURRENT BISPHOSPHONATES USE: None  PAIN MANAGEMENT: 0/10  NARCOTICS INDUCED CONSTIPATION: N/A  LIVING WILL AND CODE STATUS: Full code initially.   INTERVAL HISTORY: Kyle Long 69 y.o. male returns to the clinic today for six-month followup visit accompanied by his wife. The patient is feeling fine today with no specific complaints. He denied having any significant chest pain, shortness of breath or hemoptysis. He denied having any weight loss or night sweats. He was found on a recent CT scan of the chest to have suspicious lymphadenopathy in the low cervical and axillary lymph nodes. I ordered a PET scan and the patient is here today for evaluation and recommendation regarding his condition.  MEDICAL HISTORY: Past Medical History  Diagnosis Date  . Hypertension   . Peyronie disease   . Gilbert's syndrome   . Hyperlipidemia   . Amnesia 2009    isolated;for 30 mins w/elevated BP  . Hematuria 2009    Dr Burnell Blanks  . Hyperglycemia   . Diverticulosis of colon   . Cellulitis  2011    LUE (no PMH of MRSA)/left elbow  . Peptic ulcer 1995  . Recurrent upper respiratory infection (URI)     09/2011  BRONCHITIS  . GERD (gastroesophageal reflux disease)     OCC HEARTBURN  . Neuromuscular disorder     2010 TEMPORARY MEMORY LOSS 1-1.5 HR WAS CHECKED OUT AT [facility] , NOTHING FOUND  . Arthritis   . Cancer     skin / top head  . Lung cancer, lower lobe     Stage IIA (T1,N1)    ALLERGIES:  is allergic to lisinopril.  MEDICATIONS:  Current Outpatient Prescriptions  Medication Sig Dispense Refill  . diltiazem (CARDIZEM CD) 240 MG 24 hr capsule Take 1 capsule (240 mg total) by mouth daily. 90 capsule 1  . losartan (COZAAR) 100 MG tablet Take 0.5 tablets (50 mg total) by mouth daily. 45 tablet 1  . metoprolol tartrate (LOPRESSOR) 25 MG tablet Take 1 tablet (25 mg total) by mouth 2 (two) times daily. 180 tablet 3   No current facility-administered medications for this visit.    SURGICAL HISTORY:  Past Surgical History  Procedure Laterality Date  . Colonoscopy w/ polypectomy  2005    due 2015  . Vasectomy    . Cystoscopy  2009  . Vein surgery      R ankle  . Lobectomy  11/02/2011    Procedure: LOBECTOMY;  Surgeon: Melrose Nakayama, MD;  Location: [facility];  Service: Thoracic;  Laterality: Left;  LEFT LOWER LOBECTOMY  . Refractive surgery      Bil  . Chemo treatment  4 treatments that last 7 hours each/last treatmebt in 2013  . Skin biopsy      4 0r 5    REVIEW OF SYSTEMS:  A comprehensive review of systems was negative.   PHYSICAL EXAMINATION: General appearance: alert, cooperative and no distress Head: Normocephalic, without obvious abnormality, atraumatic Neck: no adenopathy Lymph nodes: Cervical, supraclavicular, and axillary nodes normal. Resp: clear to auscultation bilaterally Cardio: regular rate and rhythm, S1, S2 normal, no murmur, click, rub or gallop GI: soft, non-tender; bowel sounds normal; no masses,  no organomegaly Extremities:  extremities normal, atraumatic, no cyanosis or edema  ECOG PERFORMANCE STATUS: 0 - Asymptomatic  Blood pressure 160/85, pulse 72, temperature 98.7 F (37.1 C), temperature source Oral, resp. rate 19, height 5' 10"  (1.778 m), weight 221 lb 9.6 oz (100.517 kg), SpO2 100 %.  LABORATORY DATA: Lab Results  Component Value Date   WBC 9.0 09/06/2014   HGB 12.7* 09/06/2014   HCT 38.1* 09/06/2014   MCV 94.3 09/06/2014   PLT 234 09/06/2014      Chemistry      Component Value Date/Time   NA 138 09/06/2014 0928   NA 134* 08/05/2014 1553   K 4.1 09/06/2014 0928   K 3.7 08/05/2014 1553   CL 100 08/05/2014 1553   CL 104 09/03/2012 1003   CO2 26 09/06/2014 0928   CO2 24 08/05/2014 1553   BUN 18.4 09/06/2014 0928   BUN 21 08/05/2014 1553   CREATININE 1.3 09/06/2014 0928   CREATININE 1.6* 08/05/2014 1553      Component Value Date/Time   CALCIUM 9.7 09/06/2014 0928   CALCIUM 9.3 08/05/2014 1553   ALKPHOS 63 09/06/2014 0928   ALKPHOS 47 09/09/2013 1436   AST 35* 09/06/2014 0928   AST 49* 09/09/2013 1436   ALT 64* 09/06/2014 0928   ALT 64* 09/09/2013 1436   BILITOT 0.51 09/06/2014 0928   BILITOT 1.1 09/09/2013 1436       RADIOGRAPHIC STUDIES: Ct Chest Wo Contrast  09/06/2014   CLINICAL DATA:  Lung cancer diagnosed 3/13. Chemotherapy complete. Partial left lower lobectomy. Restaging.  EXAM: CT CHEST WITHOUT CONTRAST  TECHNIQUE: Multidetector CT imaging of the chest was performed following the standard protocol without IV contrast.  COMPARISON:  Plain film 08/05/2014.  Most recent CT of 03/02/2014.  FINDINGS: Mediastinum/Nodes: Redemonstration of multiple small low left greater than right cervical nodes. A left supraclavicular node measures 11 mm on image 7 versus 9 mm on the prior.  Right-sided gynecomastia. Multiple left greater than right axillary and subpectoral nodes. Index left subpectoral node measures 11 mm on image 19 versus 9 mm on the prior exam.  Aortic and branch vessel  atherosclerosis. Mild cardiomegaly. Proximal LAD coronary artery atherosclerosis. Small middle mediastinal nodes, which are not pathologic by size criteria. Hilar regions poorly evaluated without intravenous contrast. Small prevascular nodes which are similar and also not pathologic by size criteria.  Retrocrural nodes which measure up to 9 mm on image 63 versus 6 mm on the prior exam.  Lungs/Pleura: Similar right upper lobe nodule or nodules on image 30.  A tiny small linear right upper lobe nodule on image 39 is not significantly changed.  Tiny subpleural right lower lobe nodule of on image 30 is unchanged.  Status post left lower lobectomy, without locally recurrent disease.  Trace left pleural thickening, without pleural effusion.  Upper abdomen: Hepatic cysts. Normal imaged portions of the spleen, adrenal glands, kidneys. A small gastric diverticulum. Normal imaged portions of the  pancreas, gallbladder.  Musculoskeletal: Left-sided thoracotomy changes. Lucency within the right-sided the T12 vertebral body is present over prior exams and favored to represent a small hemangioma.  IMPRESSION: 1. Status post left lower lobectomy, without locally recurrent disease. 2. Increased number and size of low cervical and axillary/subpectoral nodes. Not typical distribution pattern for recurrent or metastatic lung cancer. Suspicious for lymphoproliferative processes such as lymphoma. Consider PET to direct tissue sampling. These results will be called to the ordering clinician or representative by the Radiologist Assistant, and communication documented in the PACS or zVision Dashboard. 3.  Atherosclerosis, including within the coronary arteries.   Electronically Signed   By: Abigail Miyamoto M.D.   On: 09/06/2014 10:18   Nm Pet Image Initial (pi) Skull Base To Thigh  09/17/2014   CLINICAL DATA:  Initial treatment strategy for lymphadenopathy. History of lung cancer.  EXAM: NUCLEAR MEDICINE PET SKULL BASE TO THIGH  TECHNIQUE:  10.6 mCi F-18 FDG was injected intravenously. Full-ring PET imaging was performed from the skull base to thigh after the radiotracer. CT data was obtained and used for attenuation correction and anatomic localization.  FASTING BLOOD GLUCOSE:  Value: 108 mg/dl  COMPARISON:  Chest CT 09/06/2014 and prior PET-CT from 2013  FINDINGS: NECK  The neck adenopathy is mildly hypermetabolic with SUV max between 2 and 3. Axillary and subpectoral adenopathy is also hypermetabolic with SUV max of approximately 2.6.  CHEST  Small scattered mediastinal lymph nodes mildly hypermetabolic. SUV max is 3.2. Retrocrural lymph nodes are also noted. Emphysematous changes are noted but no worrisome pulmonary nodules. Surgical changes noted in the left lower lobe.  ABDOMEN/PELVIS  There are numerous small mesenteric and retroperitoneal lymph nodes which are mildly hypermetabolic SUV max is 2.8 small pelvic lymph nodes are also noted. A left external iliac lymph node has SUV max of 2.6. Small bilateral inguinal lymph nodes but no hypermetabolism. There are few scattered low-attenuation liver lesions which are most likely benign cysts. No adrenal gland lesions. The spleen is normal in size. No focal lesions. Moderate aortic calcifications but no focal aneurysm.  SKELETON  No focal hypermetabolic activity to suggest skeletal metastasis.  IMPRESSION: 1. Numerous small scattered lymph nodes in the neck, chest, abdomen and pelvis. These are mildly hypermetabolic and most consistent with a lymphoproliferative disorder. 2. Surgical changes involving the left long but no worrisome pulmonary lesions. 3. Low-attenuation liver lesions are most consistent with benign cysts.   Electronically Signed   By: Marijo Sanes M.D.   On: 09/17/2014 14:47   ASSESSMENT AND PLAN:  This is a very pleasant 69 years old white male with history of stage IIA non-small cell lung cancer status post left lower lobectomy followed by 4 cycles of adjuvant chemotherapy with  cisplatin and Alimta and has been observation with no evidence for disease recurrence. The recent CT scan of the chest as well as a PET scan showed numerous small scattered lymph nodes in the neck, chest, abdomen and pelvis with mild hypermetabolism suspicious for a lymphoproliferative disorder. I discussed the scan results with the patient and his wife. I recommended for him to see Dr. Roxan Hockey for evaluation and consideration of excisional biopsy of one of the lymph nodes for tissue diagnosis. I will see him back for follow-up visit in 6 weeks for reevaluation and discussion of his treatment options based on the tissue biopsy. The patient was advised to call immediately if he has any concerning symptoms in the interval. The patient voices understanding of current  disease status and treatment options and is in agreement with the current care plan.  All questions were answered. The patient knows to call the clinic with any problems, questions or concerns. We can certainly see the patient much sooner if necessary.  Disclaimer: This note was dictated with voice recognition software. Similar sounding words can inadvertently be transcribed and may not be corrected upon review.

## 2014-10-05 ENCOUNTER — Other Ambulatory Visit: Payer: Self-pay | Admitting: *Deleted

## 2014-10-05 ENCOUNTER — Encounter: Payer: Self-pay | Admitting: Thoracic Surgery (Cardiothoracic Vascular Surgery)

## 2014-10-05 ENCOUNTER — Ambulatory Visit (INDEPENDENT_AMBULATORY_CARE_PROVIDER_SITE_OTHER): Payer: Commercial Managed Care - HMO | Admitting: Thoracic Surgery (Cardiothoracic Vascular Surgery)

## 2014-10-05 VITALS — BP 173/103 | HR 79 | Resp 16 | Ht 70.5 in | Wt 221.0 lb

## 2014-10-05 DIAGNOSIS — Z9889 Other specified postprocedural states: Secondary | ICD-10-CM

## 2014-10-05 DIAGNOSIS — Z902 Acquired absence of lung [part of]: Secondary | ICD-10-CM

## 2014-10-05 DIAGNOSIS — R590 Localized enlarged lymph nodes: Secondary | ICD-10-CM

## 2014-10-05 DIAGNOSIS — Z85118 Personal history of other malignant neoplasm of bronchus and lung: Secondary | ICD-10-CM | POA: Diagnosis not present

## 2014-10-05 DIAGNOSIS — R591 Generalized enlarged lymph nodes: Secondary | ICD-10-CM | POA: Diagnosis not present

## 2014-10-05 NOTE — Progress Notes (Signed)
[address]       [place] [address]             [phone]       HPI: Kyle Long returns today for a 3 year follow-up visit.  He is a 69 year old gentleman who had a left lower lobectomy back in March 2013 for stage II a non-small cell carcinoma. He received adjuvant chemotherapy postoperatively.  He was last in the office a year ago. At that time he was doing well with no evidence of recurrent disease.  He recently saw Dr. Julien Nordmann and had a CT which showed widespread mild adenopathy. A PET CT was done which showed low-grade hypermetabolic activity in these nodes. Dr. Julien Nordmann would like to get a biopsy for diagnostic purposes.  He says that he feels well. He hasn't been having any chest pain or shortness of breath. He does not have any residual incisional pain. He has not lost weight. His appetite is good.  Past Medical History  Diagnosis Date  . Hypertension   . Peyronie disease   . Gilbert's syndrome   . Hyperlipidemia   . Amnesia 2009    isolated;for 30 mins w/elevated BP  . Hematuria 2009    Dr Burnell Blanks  . Hyperglycemia   . Diverticulosis of colon   . Cellulitis 2011    LUE (no PMH of MRSA)/left elbow  . Peptic ulcer 1995  . Recurrent upper respiratory infection (URI)     09/2011  BRONCHITIS  . GERD (gastroesophageal reflux disease)     OCC HEARTBURN  . Neuromuscular disorder     2010 TEMPORARY MEMORY LOSS 1-1.5 HR WAS CHECKED OUT AT [facility] , NOTHING FOUND  . Arthritis   . Cancer     skin / top head  . Lung cancer, lower lobe     Stage IIA (T1,N1)   Past Surgical History  Procedure Laterality Date  . Colonoscopy w/ polypectomy  2005    due 2015  . Vasectomy    . Cystoscopy  2009  . Vein surgery      R ankle  . Lobectomy  11/02/2011    Procedure: LOBECTOMY;  Surgeon: Melrose Nakayama, MD;  Location: [facility];  Service: Thoracic;  Laterality: Left;  LEFT LOWER LOBECTOMY  . Refractive surgery      Bil  . Chemo treatment         4 treatments that last 7 hours each/last treatmebt in 2013  . Skin biopsy      4 0r 5      Current Outpatient Prescriptions  Medication Sig Dispense Refill  . diltiazem (CARDIZEM CD) 240 MG 24 hr capsule Take 1 capsule (240 mg total) by mouth daily. 90 capsule 1  . losartan (COZAAR) 100 MG tablet Take 0.5 tablets (50 mg total) by mouth daily. 45 tablet 1  . metoprolol tartrate (LOPRESSOR) 25 MG tablet Take 1 tablet (25 mg total) by mouth 2 (two) times daily. 180 tablet 3   No current facility-administered medications for this visit.    Physical Exam BP 173/103 mmHg  Pulse 79  Resp 16  Ht 5' 10.5" (1.791 m)  Wt 221 lb (100.245 kg)  BMI 31.25 kg/m2  SpO27 71% 69 year old man in no acute distress Alert and oriented 3 with no focal deficits HEENT unremarkable Neck palpable posterior cervical nodes on the left, questionably palpable left scalene nodes Cardiac regular rate and rhythm normal S1 and S2 Lungs clear with slightly  diminished breath sounds at left base Positive left axillary adenopathy Abdomen soft nontender No edema  Diagnostic Tests: NUCLEAR MEDICINE PET SKULL BASE TO THIGH  TECHNIQUE: 10.6 mCi F-18 FDG was injected intravenously. Full-ring PET imaging was performed from the skull base to thigh after the radiotracer. CT data was obtained and used for attenuation correction and anatomic localization.  FASTING BLOOD GLUCOSE: Value: 108 mg/dl  COMPARISON: Chest CT 09/06/2014 and prior PET-CT from 2013  FINDINGS: NECK  The neck adenopathy is mildly hypermetabolic with SUV max between 2 and 3. Axillary and subpectoral adenopathy is also hypermetabolic with SUV max of approximately 2.6.  CHEST  Small scattered mediastinal lymph nodes mildly hypermetabolic. SUV max is 3.2. Retrocrural lymph nodes are also noted. Emphysematous changes are noted but no worrisome pulmonary nodules. Surgical changes noted in the left lower  lobe.  ABDOMEN/PELVIS  There are numerous small mesenteric and retroperitoneal lymph nodes which are mildly hypermetabolic SUV max is 2.8 small pelvic lymph nodes are also noted. A left external iliac lymph node has SUV max of 2.6. Small bilateral inguinal lymph nodes but no hypermetabolism. There are few scattered low-attenuation liver lesions which are most likely benign cysts. No adrenal gland lesions. The spleen is normal in size. No focal lesions. Moderate aortic calcifications but no focal aneurysm.  SKELETON  No focal hypermetabolic activity to suggest skeletal metastasis.  IMPRESSION: 1. Numerous small scattered lymph nodes in the neck, chest, abdomen and pelvis. These are mildly hypermetabolic and most consistent with a lymphoproliferative disorder. 2. Surgical changes involving the left long but no worrisome pulmonary lesions. 3. Low-attenuation liver lesions are most consistent with benign cysts.   Electronically Signed  By: Marijo Sanes M.D.  On: 09/17/2014 14:47  Impression: 69 year old gentleman who has a history of lung cancer. This was a stage IIA lesion that was resected 3 years ago. He now has diffuse adenopathy is mildly hypermetabolic by PET CT. I reviewed the films personally and reviewed them with Kyle Long as well. These findings are suspicious for a lymphoproliferative disorder. I think it is unlikely, but not impossible, that this adenopathy is related to his previous lung cancer.   It appears from looking at the PET CT and examining the patient that the easiest target is the left supraclavicular nodes.  I recommended to the patient that we do a left supraclavicular lymph node biopsy. This will be done in the operating room under local anesthesia with intravenous sedation. This is an outpatient procedure. I discussed the indications, risks, benefits, and alternatives. He understands there is a possibility that it will be  nondiagnostic. He understands risk of the procedure include, but are not limited to reaction to local anesthetic, infection, lymphatic leak or seroma.  He accepts the risk and wishes to proceed.  Plan: Left supraclavicular lymph node biopsy on Friday, March 18  Melrose Nakayama, MD [facility] [phone]

## 2014-10-11 NOTE — Pre-Procedure Instructions (Signed)
Kyle Long  10/11/2014   Your procedure is scheduled on:  Friday October 15, 2014 at 7:30 AM.  Report to [facility] Admitting at 5:30 AM.  Call this number if you have problems the morning of surgery: [phone]   Remember:   Do not eat food or drink liquids after midnight.   Take these medicines the morning of surgery with A SIP OF WATER: Metoprolol (Lopressor)   Do not wear jewelry.  Do not wear lotions, powders, or cologne. You may NOT wear deodorant.  Men may shave face and neck.  Do not bring valuables to the hospital.  [facility] is not responsible for any belongings or valuables.               Contacts, dentures or bridgework may not be worn into surgery.  Leave suitcase in the car. After surgery it may be brought to your room.  For patients admitted to the hospital, discharge time is determined by your treatment team.               Patients discharged the day of surgery will not be allowed to drive home.  Name and phone number of your driver:   Special Instructions: Shower using CHG soap the night before and the morning of your surgery   Please read over the following fact sheets that you were given: Pain Booklet, Coughing and Deep Breathing, MRSA Information and Surgical Site Infection Prevention

## 2014-10-12 ENCOUNTER — Encounter (HOSPITAL_COMMUNITY)
Admission: RE | Admit: 2014-10-12 | Discharge: 2014-10-12 | Disposition: A | Discharge status: Home / Self Care | Payer: Commercial Managed Care - HMO | Source: Ambulatory Visit | Attending: Thoracic Surgery (Cardiothoracic Vascular Surgery) | Admitting: Thoracic Surgery (Cardiothoracic Vascular Surgery)

## 2014-10-12 ENCOUNTER — Encounter (HOSPITAL_COMMUNITY): Payer: Self-pay

## 2014-10-12 VITALS — BP 156/89 | HR 77 | Temp 98.4°F | Resp 18 | Ht 70.5 in | Wt 221.0 lb

## 2014-10-12 DIAGNOSIS — Z85118 Personal history of other malignant neoplasm of bronchus and lung: Secondary | ICD-10-CM | POA: Diagnosis not present

## 2014-10-12 DIAGNOSIS — E785 Hyperlipidemia, unspecified: Secondary | ICD-10-CM | POA: Diagnosis not present

## 2014-10-12 DIAGNOSIS — Z902 Acquired absence of lung [part of]: Secondary | ICD-10-CM | POA: Diagnosis not present

## 2014-10-12 DIAGNOSIS — Z86010 Personal history of colonic polyps: Secondary | ICD-10-CM | POA: Diagnosis not present

## 2014-10-12 DIAGNOSIS — Z8711 Personal history of peptic ulcer disease: Secondary | ICD-10-CM | POA: Diagnosis not present

## 2014-10-12 DIAGNOSIS — M1990 Unspecified osteoarthritis, unspecified site: Secondary | ICD-10-CM | POA: Diagnosis not present

## 2014-10-12 DIAGNOSIS — Z87891 Personal history of nicotine dependence: Secondary | ICD-10-CM | POA: Diagnosis not present

## 2014-10-12 DIAGNOSIS — K219 Gastro-esophageal reflux disease without esophagitis: Secondary | ICD-10-CM | POA: Diagnosis not present

## 2014-10-12 DIAGNOSIS — I10 Essential (primary) hypertension: Secondary | ICD-10-CM | POA: Diagnosis not present

## 2014-10-12 DIAGNOSIS — K5730 Diverticulosis of large intestine without perforation or abscess without bleeding: Secondary | ICD-10-CM | POA: Diagnosis not present

## 2014-10-12 DIAGNOSIS — R590 Localized enlarged lymph nodes: Secondary | ICD-10-CM | POA: Diagnosis not present

## 2014-10-12 DIAGNOSIS — R413 Other amnesia: Secondary | ICD-10-CM | POA: Diagnosis not present

## 2014-10-12 DIAGNOSIS — J984 Other disorders of lung: Secondary | ICD-10-CM | POA: Diagnosis not present

## 2014-10-12 DIAGNOSIS — Z9852 Vasectomy status: Secondary | ICD-10-CM | POA: Diagnosis not present

## 2014-10-12 HISTORY — DX: Diverticulosis of intestine, part unspecified, without perforation or abscess without bleeding: K57.90

## 2014-10-12 LAB — CBC
HCT: 41.3 % (ref 39.0–52.0)
HEMOGLOBIN: 14.1 g/dL (ref 13.0–17.0)
MCH: 32.7 pg (ref 26.0–34.0)
MCHC: 34.1 g/dL (ref 30.0–36.0)
MCV: 95.8 fL (ref 78.0–100.0)
PLATELETS: 100 10*3/uL — AB (ref 150–400)
RBC: 4.31 MIL/uL (ref 4.22–5.81)
RDW: 15.6 % — ABNORMAL HIGH (ref 11.5–15.5)
WBC: 6.8 10*3/uL (ref 4.0–10.5)

## 2014-10-12 LAB — COMPREHENSIVE METABOLIC PANEL
ALK PHOS: 57 U/L (ref 39–117)
ALT: 35 U/L (ref 0–53)
AST: 34 U/L (ref 0–37)
Albumin: 4.1 g/dL (ref 3.5–5.2)
Anion gap: 9 (ref 5–15)
BUN: 13 mg/dL (ref 6–23)
CHLORIDE: 103 mmol/L (ref 96–112)
CO2: 23 mmol/L (ref 19–32)
CREATININE: 1.44 mg/dL — AB (ref 0.50–1.35)
Calcium: 9.3 mg/dL (ref 8.4–10.5)
GFR calc Af Amer: 55 mL/min — ABNORMAL LOW (ref >90)
GFR, EST NON AFRICAN AMERICAN: 48 mL/min — AB (ref >90)
Glucose, Bld: 123 mg/dL — ABNORMAL HIGH (ref 70–99)
Potassium: 4 mmol/L (ref 3.5–5.1)
Sodium: 135 mmol/L (ref 135–145)
Total Bilirubin: 1.1 mg/dL (ref 0.3–1.2)
Total Protein: 7.2 g/dL (ref 6.0–8.3)

## 2014-10-12 LAB — SURGICAL PCR SCREEN
MRSA, PCR: NEGATIVE
STAPHYLOCOCCUS AUREUS: NEGATIVE

## 2014-10-12 LAB — PROTIME-INR
INR: 1.03 (ref 0.00–1.49)
Prothrombin Time: 13.6 seconds (ref 11.6–15.2)

## 2014-10-12 LAB — APTT: aPTT: 30 seconds (ref 24–37)

## 2014-10-12 NOTE — Progress Notes (Signed)
Nurse called Levonne Spiller and left a voicemail informing her of patients platelet count.

## 2014-10-12 NOTE — Progress Notes (Signed)
   10/12/14 1014  [place]  Have you ever been diagnosed with sleep apnea through a sleep study? No  Do you snore loudly (loud enough to be heard through closed doors)?  0  Do you often feel tired, fatigued, or sleepy during the daytime? 0  Has anyone observed you stop breathing during your sleep? 1  Do you have, or are you being treated for high blood pressure? 1  BMI more than 35 kg/m2? 0  Age over 71 years old? 1  Neck circumference greater than 40 cm/16 inches? 0  Gender: 1  Obstructive Sleep Apnea Score 4   This patient has screened at risk for sleep apnea using the STOP Bang tool used during a pre-surgical visit. A score of 4 or greater is at risk for sleep apnea.

## 2014-10-12 NOTE — Progress Notes (Signed)
PCP is [name]. Patient denied having any chest pain, discomfort, or shortness of breath.   Wife at chair side during PAT visit.   Patient informed Nurse that he takes Diltiazem at bedtime, therefore patient was instructed to take  Metoprolol DOS. Patient and wife both verbalized understanding.

## 2014-10-15 ENCOUNTER — Ambulatory Visit (HOSPITAL_COMMUNITY): Payer: Commercial Managed Care - HMO | Admitting: Certified Registered Nurse Anesthetist

## 2014-10-15 ENCOUNTER — Ambulatory Visit (HOSPITAL_COMMUNITY)
Admission: RE | Admit: 2014-10-15 | Discharge: 2014-10-15 | Disposition: A | Discharge status: Home / Self Care | Payer: Commercial Managed Care - HMO | Source: Ambulatory Visit | Attending: Thoracic Surgery (Cardiothoracic Vascular Surgery) | Admitting: Thoracic Surgery (Cardiothoracic Vascular Surgery)

## 2014-10-15 ENCOUNTER — Encounter (HOSPITAL_COMMUNITY): Payer: Self-pay | Admitting: *Deleted

## 2014-10-15 ENCOUNTER — Encounter (HOSPITAL_COMMUNITY)
Admission: RE | Disposition: A | Discharge status: Home / Self Care | Payer: Self-pay | Source: Ambulatory Visit | Attending: Thoracic Surgery (Cardiothoracic Vascular Surgery)

## 2014-10-15 DIAGNOSIS — Z902 Acquired absence of lung [part of]: Secondary | ICD-10-CM | POA: Insufficient documentation

## 2014-10-15 DIAGNOSIS — K219 Gastro-esophageal reflux disease without esophagitis: Secondary | ICD-10-CM | POA: Insufficient documentation

## 2014-10-15 DIAGNOSIS — R413 Other amnesia: Secondary | ICD-10-CM | POA: Diagnosis not present

## 2014-10-15 DIAGNOSIS — K5730 Diverticulosis of large intestine without perforation or abscess without bleeding: Secondary | ICD-10-CM | POA: Insufficient documentation

## 2014-10-15 DIAGNOSIS — Z87891 Personal history of nicotine dependence: Secondary | ICD-10-CM | POA: Insufficient documentation

## 2014-10-15 DIAGNOSIS — M1990 Unspecified osteoarthritis, unspecified site: Secondary | ICD-10-CM | POA: Diagnosis not present

## 2014-10-15 DIAGNOSIS — E785 Hyperlipidemia, unspecified: Secondary | ICD-10-CM | POA: Diagnosis not present

## 2014-10-15 DIAGNOSIS — I10 Essential (primary) hypertension: Secondary | ICD-10-CM | POA: Insufficient documentation

## 2014-10-15 DIAGNOSIS — C8301 Small cell B-cell lymphoma, lymph nodes of head, face, and neck: Secondary | ICD-10-CM | POA: Diagnosis not present

## 2014-10-15 DIAGNOSIS — Z86010 Personal history of colonic polyps: Secondary | ICD-10-CM | POA: Insufficient documentation

## 2014-10-15 DIAGNOSIS — Z9852 Vasectomy status: Secondary | ICD-10-CM | POA: Insufficient documentation

## 2014-10-15 DIAGNOSIS — Z8711 Personal history of peptic ulcer disease: Secondary | ICD-10-CM | POA: Insufficient documentation

## 2014-10-15 DIAGNOSIS — R590 Localized enlarged lymph nodes: Secondary | ICD-10-CM | POA: Diagnosis not present

## 2014-10-15 DIAGNOSIS — R599 Enlarged lymph nodes, unspecified: Secondary | ICD-10-CM | POA: Diagnosis not present

## 2014-10-15 DIAGNOSIS — Z85118 Personal history of other malignant neoplasm of bronchus and lung: Secondary | ICD-10-CM | POA: Insufficient documentation

## 2014-10-15 HISTORY — PX: SUPRACLAVICAL NODE BIOPSY: SHX5165

## 2014-10-15 SURGERY — BIOPSY, LYMPH NODE, SUPRACLAVICULAR
Anesthesia: Monitor Anesthesia Care | Site: Chest | Laterality: Left

## 2014-10-15 MED ORDER — OXYCODONE HCL 5 MG/5ML PO SOLN: 5.0000 mg | Freq: Once | ORAL | Status: DC | PRN

## 2014-10-15 MED ORDER — FENTANYL CITRATE 0.05 MG/ML IJ SOLN
INTRAMUSCULAR | Status: DC | PRN
  Administered 2014-10-15 (×2): 25 ug via INTRAVENOUS
  Administered 2014-10-15 (×2): 50 ug via INTRAVENOUS

## 2014-10-15 MED ORDER — SUCCINYLCHOLINE CHLORIDE 20 MG/ML IJ SOLN
INTRAMUSCULAR | Status: AC
  Filled 2014-10-15: qty 1

## 2014-10-15 MED ORDER — ROCURONIUM BROMIDE 50 MG/5ML IV SOLN
INTRAVENOUS | Status: AC
  Filled 2014-10-15: qty 1

## 2014-10-15 MED ORDER — LIDOCAINE-EPINEPHRINE (PF) 1 %-1:200000 IJ SOLN
INTRAMUSCULAR | Status: DC | PRN
  Administered 2014-10-15: 5 mL

## 2014-10-15 MED ORDER — MIDAZOLAM HCL 5 MG/5ML IJ SOLN
INTRAMUSCULAR | Status: DC | PRN
  Administered 2014-10-15: 2 mg via INTRAVENOUS

## 2014-10-15 MED ORDER — 0.9 % SODIUM CHLORIDE (POUR BTL) OPTIME
TOPICAL | Status: DC | PRN
  Administered 2014-10-15: 1000 mL

## 2014-10-15 MED ORDER — MIDAZOLAM HCL 2 MG/2ML IJ SOLN
INTRAMUSCULAR | Status: AC
  Filled 2014-10-15: qty 2

## 2014-10-15 MED ORDER — HYDROCODONE-ACETAMINOPHEN 5-325 MG PO TABS: 1.0000 | ORAL_TABLET | Freq: Four times a day (QID) | ORAL | Status: DC | PRN

## 2014-10-15 MED ORDER — FENTANYL CITRATE 0.05 MG/ML IJ SOLN
INTRAMUSCULAR | Status: AC
  Filled 2014-10-15: qty 5

## 2014-10-15 MED ORDER — LIDOCAINE-EPINEPHRINE (PF) 1 %-1:200000 IJ SOLN
INTRAMUSCULAR | Status: AC
  Filled 2014-10-15: qty 10

## 2014-10-15 MED ORDER — CEFAZOLIN SODIUM-DEXTROSE 2-3 GM-% IV SOLR
INTRAVENOUS | Status: DC | PRN
  Administered 2014-10-15: 2 g via INTRAVENOUS

## 2014-10-15 MED ORDER — FENTANYL CITRATE 0.05 MG/ML IJ SOLN: 25.0000 ug | INTRAMUSCULAR | Status: DC | PRN

## 2014-10-15 MED ORDER — ONDANSETRON HCL 4 MG/2ML IJ SOLN: 4.0000 mg | Freq: Four times a day (QID) | INTRAMUSCULAR | Status: DC | PRN

## 2014-10-15 MED ORDER — LIDOCAINE HCL (CARDIAC) 20 MG/ML IV SOLN
INTRAVENOUS | Status: AC
  Filled 2014-10-15: qty 5

## 2014-10-15 MED ORDER — PROPOFOL INFUSION 10 MG/ML OPTIME
INTRAVENOUS | Status: DC | PRN
  Administered 2014-10-15: 50 ug/kg/min via INTRAVENOUS

## 2014-10-15 MED ORDER — PROPOFOL 10 MG/ML IV BOLUS
INTRAVENOUS | Status: AC
  Filled 2014-10-15: qty 20

## 2014-10-15 MED ORDER — LACTATED RINGERS IV SOLN
INTRAVENOUS | Status: DC | PRN
  Administered 2014-10-15: 07:00:00 via INTRAVENOUS

## 2014-10-15 MED ORDER — ONDANSETRON HCL 4 MG/2ML IJ SOLN
INTRAMUSCULAR | Status: AC
  Filled 2014-10-15: qty 2

## 2014-10-15 MED ORDER — OXYCODONE HCL 5 MG PO TABS: 5.0000 mg | ORAL_TABLET | Freq: Once | ORAL | Status: DC | PRN

## 2014-10-15 MED ORDER — ONDANSETRON HCL 4 MG/2ML IJ SOLN
INTRAMUSCULAR | Status: DC | PRN
  Administered 2014-10-15: 4 mg via INTRAVENOUS

## 2014-10-15 SURGICAL SUPPLY — 41 items
ADH SKN CLS APL DERMABOND .7 (GAUZE/BANDAGES/DRESSINGS) ×1
CANISTER SUCTION 2500CC (MISCELLANEOUS) ×3 IMPLANT
CLIP TI MEDIUM 6 (CLIP) ×3 IMPLANT
CLIP TI WIDE RED SMALL 6 (CLIP) ×3 IMPLANT
CONT SPEC 4OZ CLIKSEAL STRL BL (MISCELLANEOUS) ×5 IMPLANT
COVER SURGICAL LIGHT HANDLE (MISCELLANEOUS) ×6 IMPLANT
DERMABOND ADVANCED (GAUZE/BANDAGES/DRESSINGS) ×2
DERMABOND ADVANCED .7 DNX12 (GAUZE/BANDAGES/DRESSINGS) ×1 IMPLANT
DRAPE CHEST BREAST 15X10 FENES (DRAPES) ×3 IMPLANT
ELECT CAUTERY BLADE 6.4 (BLADE) ×3 IMPLANT
ELECT REM PT RETURN 9FT ADLT (ELECTROSURGICAL) ×3
ELECTRODE REM PT RTRN 9FT ADLT (ELECTROSURGICAL) ×1 IMPLANT
GAUZE SPONGE 4X4 12PLY STRL (GAUZE/BANDAGES/DRESSINGS) ×3 IMPLANT
GLOVE BIO SURGEON STRL SZ 6.5 (GLOVE) ×1 IMPLANT
GLOVE BIO SURGEONS STRL SZ 6.5 (GLOVE) ×1
GLOVE BIOGEL PI IND STRL 6.5 (GLOVE) IMPLANT
GLOVE BIOGEL PI INDICATOR 6.5 (GLOVE) ×2
GLOVE ECLIPSE 6.5 STRL STRAW (GLOVE) ×2 IMPLANT
GLOVE EUDERMIC 7 POWDERFREE (GLOVE) ×3 IMPLANT
GLOVE SURG SS PI 6.0 STRL IVOR (GLOVE) ×2 IMPLANT
GOWN STRL REUS W/ TWL LRG LVL3 (GOWN DISPOSABLE) ×1 IMPLANT
GOWN STRL REUS W/TWL LRG LVL3 (GOWN DISPOSABLE) ×3
KIT BASIN OR (CUSTOM PROCEDURE TRAY) ×3 IMPLANT
KIT ROOM TURNOVER OR (KITS) ×3 IMPLANT
LIQUID BAND (GAUZE/BANDAGES/DRESSINGS) ×2 IMPLANT
NEEDLE 22X1 1/2 (OR ONLY) (NEEDLE) ×2 IMPLANT
NS IRRIG 1000ML POUR BTL (IV SOLUTION) ×3 IMPLANT
PACK GENERAL/GYN (CUSTOM PROCEDURE TRAY) ×3 IMPLANT
PAD ARMBOARD 7.5X6 YLW CONV (MISCELLANEOUS) ×6 IMPLANT
SPONGE INTESTINAL PEANUT (DISPOSABLE) ×3 IMPLANT
SUT SILK 2 0 TIES 10X30 (SUTURE) ×3 IMPLANT
SUT VIC AB 2-0 CT1 27 (SUTURE)
SUT VIC AB 2-0 CT1 TAPERPNT 27 (SUTURE) ×1 IMPLANT
SUT VIC AB 3-0 SH 27 (SUTURE) ×3
SUT VIC AB 3-0 SH 27X BRD (SUTURE) ×1 IMPLANT
SUT VIC AB 3-0 X1 27 (SUTURE) ×3 IMPLANT
SUT VICRYL 4-0 PS2 18IN ABS (SUTURE) ×3 IMPLANT
SYR CONTROL 10ML LL (SYRINGE) IMPLANT
TOWEL OR 17X24 6PK STRL BLUE (TOWEL DISPOSABLE) ×5 IMPLANT
TOWEL OR 17X26 10 PK STRL BLUE (TOWEL DISPOSABLE) ×3 IMPLANT
WATER STERILE IRR 1000ML POUR (IV SOLUTION) ×1 IMPLANT

## 2014-10-15 NOTE — Discharge Instructions (Addendum)
Do not drive or engage in heavy physical activity for 24 hours  You may resume normal activities tomorrow  You may shower tomorrow  There is a medical adhesive over the incision, it will begin to peel off in 7-10 days  My office will contact you with a follow up appointment  Call [phone] if you develop severe pain, redness, drainage from the incision, excessive swelling or fever > 101F  What to eat:  For your first meals, you should eat lightly; only small meals initially.  If you do not have nausea, you may eat larger meals.  Avoid spicy, greasy and heavy food.    General Anesthesia, Adult, Care After  Refer to this sheet in the next few weeks. These instructions provide you with information on caring for yourself after your procedure. Your health care provider may also give you more specific instructions. Your treatment has been planned according to current medical practices, but problems sometimes occur. Call your health care provider if you have any problems or questions after your procedure.  WHAT TO EXPECT AFTER THE PROCEDURE  After the procedure, it is typical to experience:  Sleepiness.  Nausea and vomiting. HOME CARE INSTRUCTIONS  For the first 24 hours after general anesthesia:  Have a responsible person with you.  Do not drive a car. If you are alone, do not take public transportation.  Do not drink alcohol.  Do not take medicine that has not been prescribed by your health care provider.  Do not sign important papers or make important decisions.  You may resume a normal diet and activities as directed by your health care provider.  Change bandages (dressings) as directed.  If you have questions or problems that seem related to general anesthesia, call the hospital and ask for the anesthetist or anesthesiologist on call. SEEK MEDICAL CARE IF:  You have nausea and vomiting that continue the day after anesthesia.  You develop a rash. SEEK IMMEDIATE MEDICAL CARE IF:  You  have difficulty breathing.  You have chest pain.  You have any allergic problems. Document Released: 10/22/2000 Document Revised: 03/18/2013 Document Reviewed: 01/29/2013  [facility] Patient Information 2014 [place], [place].   Sore Throat    A sore throat is a painful, burning, sore, or scratchy feeling of the throat. There may be pain or tenderness when swallowing or talking. You may have other symptoms with a sore throat. These include coughing, sneezing, fever, or a swollen neck. A sore throat is often the first sign of another sickness. These sicknesses may include a cold, flu, strep throat, or an infection called mono. Most sore throats go away without medical treatment.  HOME CARE  Only take medicine as told by your doctor.  Drink enough fluids to keep your pee (urine) clear or pale yellow.  Rest as needed.  Try using throat sprays, lozenges, or suck on hard candy (if older than 4 years or as told).  Sip warm liquids, such as broth, herbal tea, or warm water with honey. Try sucking on frozen ice pops or drinking cold liquids.  Rinse the mouth (gargle) with salt water. Mix 1 teaspoon salt with 8 ounces of water.  Do not smoke. Avoid being around others when they are smoking.  Put a humidifier in your bedroom at night to moisten the air. You can also turn on a hot shower and sit in the bathroom for 5-10 minutes. Be sure the bathroom door is closed. GET HELP RIGHT AWAY IF:  You have trouble breathing.  You cannot swallow fluids, soft foods, or your spit (saliva).  You have more puffiness (swelling) in the throat.  Your sore throat does not get better in 7 days.  You feel sick to your stomach (nauseous) and throw up (vomit).  You have a fever or lasting symptoms for more than 2-3 days.  You have a fever and your symptoms suddenly get worse. MAKE SURE YOU:  Understand these instructions.  Will watch your condition.  Will get help right away if you are not doing well or get  worse. Document Released: 04/24/2008 Document Revised: 04/09/2012 Document Reviewed: 03/23/2012  [facility] Patient Information 2015 [place], [place]. This information is not intended to replace advice given to you by your health care provider. Make sure you discuss any questions you have with your health care provider.

## 2014-10-15 NOTE — Brief Op Note (Signed)
10/15/2014  9:13 AM  PATIENT:  [name]  69 y.o. male  PRE-OPERATIVE DIAGNOSIS:  LEFT SUPRACLAVICULAR ADENOPATHY  POST-OPERATIVE DIAGNOSIS:  LEFT SUPRACLAVICULAR ADENOPATHY  PROCEDURE:  Procedure(s) with comments: LEFT SUPRACLAVICAL LYMPH NODE BIOPSY (Left) - LEFT SUPRACLAVICAL LYMPH NODE BIOPSY  SURGEON:  Surgeon(s) and Role:    * Melrose Nakayama, MD - Primary   ASSISTANTS: Page Spiro RNFA   ANESTHESIA:   local and IV sedation  EBL:  Total I/O In: 500 [I.V.:500] Out: 20 [Blood:20]  BLOOD ADMINISTERED:none  DRAINS: none   LOCAL MEDICATIONS USED:  LIDOCAINE  and Amount: 8 ml  SPECIMEN:  Source of Specimen:  lymph nodes  DISPOSITION OF SPECIMEN:  PATHOLOGY  COUNTS:  YES  PLAN OF CARE: Discharge to home after PACU  PATIENT DISPOSITION:  PACU - hemodynamically stable.   Delay start of Pharmacological VTE agent (>24hrs) due to surgical blood loss or risk of bleeding: not applicable  FINDINGS: 2 enlarged lymph nodes, frozen- no metastatic cancer present

## 2014-10-15 NOTE — Op Note (Signed)
NAME:  Kyle Long, Kyle Long NO.:  [id number]  MEDICAL RECORD NO.:  [id number]  LOCATION:  [facility]                         FACILITY:  [facility]  PHYSICIAN:  Revonda Standard. Roxan Hockey, M.D.DATE OF BIRTH:  06/21/1946  DATE OF PROCEDURE:  10/15/2014 DATE OF DISCHARGE:  10/15/2014                              OPERATIVE REPORT   PREOPERATIVE DIAGNOSIS:  Left supraclavicular adenopathy.  POSTOPERATIVE DIAGNOSIS:  Left supraclavicular adenopathy.  PROCEDURE:  Left supraclavicular lymph node biopsy.  SURGEON:  Revonda Standard. Roxan Hockey, MD  ASSISTANT:  Olin Pia, RNFA.  ANESTHESIA:  Local with intravenous sedation.  FINDINGS:  Two enlarged nodes approximately 1.5 and 2 cm in diameter respectively.  No evidence of metastatic cancer on frozen section.  CLINICAL NOTE:  Kyle Long is a 69 year old gentleman, treated in the past for stage IIA non-small cell carcinoma.  He had a lobectomy followed by adjuvant chemotherapy.  He recently had a CT which showed widespread mild adenopathy. A PET-CT was done which showed low-grade hypermetabolic activity in the nodes.  He needs a lymph node biopsy for diagnostic purposes.  The most accessible nodes appear to be the left supraclavicular nodes.  The indications, risks, benefits, and alternatives were discussed in detail with the patient.  He understood and accepted the risks and agreed to proceed.  OPERATIVE NOTE:  Kyle Long was brought to the operating room on October 15, 2014.  He was given intravenous sedation and monitored by the Anesthesia Service.  Intravenous antibiotics were administered.  The neck was prepped and draped in usual sterile fashion.  1% Lidocaine was used for local anesthetic, a total of 8 mL was used.  The area over the left clavicle was anesthetized.  After ensuring adequate local anesthetic effect, an incision was made and the dissection was carried down between the scalene muscles.  There was a relatively  large lymph node approximately 2 cm in diameter and purple in color.  This node was excised and sent for frozen section.  Immediately below this node, there was a second lymph node, which was slightly smaller, being approximately 1.5 cm in diameter.  This node was excised as well and sent only for permanent pathology.  The wound was copiously irrigated with saline.  There was good hemostasis.  The incision was closed in 2 layers with interrupted 3-0 Vicryl subcutaneous suture followed by a 4-0 Vicryl subcuticular suture.  Dermabond was applied. Frozen section showed no evidence of metastatic lung cancer.  Lymphoma workup will be performed.     Revonda Standard Roxan Hockey, M.D.     SCH/MEDQ  D:  10/15/2014  T:  10/15/2014  Job:  [id number]

## 2014-10-15 NOTE — H&P (View-Only) (Signed)
[address]       [place] [address]             [phone]       HPI: Kyle Long returns today for a 3 year follow-up visit.  He is a 69 year old gentleman who had a left lower lobectomy back in March 2013 for stage II a non-small cell carcinoma. He received adjuvant chemotherapy postoperatively.  He was last in the office a year ago. At that time he was doing well with no evidence of recurrent disease.  He recently saw Dr. Julien Nordmann and had a CT which showed widespread mild adenopathy. A PET CT was done which showed low-grade hypermetabolic activity in these nodes. Dr. Julien Nordmann would like to get a biopsy for diagnostic purposes.  He says that he feels well. He hasn't been having any chest pain or shortness of breath. He does not have any residual incisional pain. He has not lost weight. His appetite is good.  Past Medical History  Diagnosis Date  . Hypertension   . Peyronie disease   . Gilbert's syndrome   . Hyperlipidemia   . Amnesia 2009    isolated;for 30 mins w/elevated BP  . Hematuria 2009    Dr Burnell Blanks  . Hyperglycemia   . Diverticulosis of colon   . Cellulitis 2011    LUE (no PMH of MRSA)/left elbow  . Peptic ulcer 1995  . Recurrent upper respiratory infection (URI)     09/2011  BRONCHITIS  . GERD (gastroesophageal reflux disease)     OCC HEARTBURN  . Neuromuscular disorder     2010 TEMPORARY MEMORY LOSS 1-1.5 HR WAS CHECKED OUT AT [facility] , NOTHING FOUND  . Arthritis   . Cancer     skin / top head  . Lung cancer, lower lobe     Stage IIA (T1,N1)   Past Surgical History  Procedure Laterality Date  . Colonoscopy w/ polypectomy  2005    due 2015  . Vasectomy    . Cystoscopy  2009  . Vein surgery      R ankle  . Lobectomy  11/02/2011    Procedure: LOBECTOMY;  Surgeon: Melrose Nakayama, MD;  Location: [facility];  Service: Thoracic;  Laterality: Left;  LEFT LOWER LOBECTOMY  . Refractive surgery      Bil  . Chemo treatment         4 treatments that last 7 hours each/last treatmebt in 2013  . Skin biopsy      4 0r 5      Current Outpatient Prescriptions  Medication Sig Dispense Refill  . diltiazem (CARDIZEM CD) 240 MG 24 hr capsule Take 1 capsule (240 mg total) by mouth daily. 90 capsule 1  . losartan (COZAAR) 100 MG tablet Take 0.5 tablets (50 mg total) by mouth daily. 45 tablet 1  . metoprolol tartrate (LOPRESSOR) 25 MG tablet Take 1 tablet (25 mg total) by mouth 2 (two) times daily. 180 tablet 3   No current facility-administered medications for this visit.    Physical Exam BP 173/103 mmHg  Pulse 79  Resp 16  Ht 5' 10.5" (1.791 m)  Wt 221 lb (100.245 kg)  BMI 31.25 kg/m2  SpO65 39% 69 year old man in no acute distress Alert and oriented 3 with no focal deficits HEENT unremarkable Neck palpable posterior cervical nodes on the left, questionably palpable left scalene nodes Cardiac regular rate and rhythm normal S1 and S2 Lungs clear with slightly  diminished breath sounds at left base Positive left axillary adenopathy Abdomen soft nontender No edema  Diagnostic Tests: NUCLEAR MEDICINE PET SKULL BASE TO THIGH  TECHNIQUE: 10.6 mCi F-18 FDG was injected intravenously. Full-ring PET imaging was performed from the skull base to thigh after the radiotracer. CT data was obtained and used for attenuation correction and anatomic localization.  FASTING BLOOD GLUCOSE: Value: 108 mg/dl  COMPARISON: Chest CT 09/06/2014 and prior PET-CT from 2013  FINDINGS: NECK  The neck adenopathy is mildly hypermetabolic with SUV max between 2 and 3. Axillary and subpectoral adenopathy is also hypermetabolic with SUV max of approximately 2.6.  CHEST  Small scattered mediastinal lymph nodes mildly hypermetabolic. SUV max is 3.2. Retrocrural lymph nodes are also noted. Emphysematous changes are noted but no worrisome pulmonary nodules. Surgical changes noted in the left lower  lobe.  ABDOMEN/PELVIS  There are numerous small mesenteric and retroperitoneal lymph nodes which are mildly hypermetabolic SUV max is 2.8 small pelvic lymph nodes are also noted. A left external iliac lymph node has SUV max of 2.6. Small bilateral inguinal lymph nodes but no hypermetabolism. There are few scattered low-attenuation liver lesions which are most likely benign cysts. No adrenal gland lesions. The spleen is normal in size. No focal lesions. Moderate aortic calcifications but no focal aneurysm.  SKELETON  No focal hypermetabolic activity to suggest skeletal metastasis.  IMPRESSION: 1. Numerous small scattered lymph nodes in the neck, chest, abdomen and pelvis. These are mildly hypermetabolic and most consistent with a lymphoproliferative disorder. 2. Surgical changes involving the left long but no worrisome pulmonary lesions. 3. Low-attenuation liver lesions are most consistent with benign cysts.   Electronically Signed  By: Marijo Sanes M.D.  On: 09/17/2014 14:47  Impression: 69 year old gentleman who has a history of lung cancer. This was a stage IIA lesion that was resected 3 years ago. He now has diffuse adenopathy is mildly hypermetabolic by PET CT. I reviewed the films personally and reviewed them with Kyle Long as well. These findings are suspicious for a lymphoproliferative disorder. I think it is unlikely, but not impossible, that this adenopathy is related to his previous lung cancer.   It appears from looking at the PET CT and examining the patient that the easiest target is the left supraclavicular nodes.  I recommended to the patient that we do a left supraclavicular lymph node biopsy. This will be done in the operating room under local anesthesia with intravenous sedation. This is an outpatient procedure. I discussed the indications, risks, benefits, and alternatives. He understands there is a possibility that it will be  nondiagnostic. He understands risk of the procedure include, but are not limited to reaction to local anesthetic, infection, lymphatic leak or seroma.  He accepts the risk and wishes to proceed.  Plan: Left supraclavicular lymph node biopsy on Friday, March 18  Melrose Nakayama, MD [facility] [phone]

## 2014-10-15 NOTE — Anesthesia Postprocedure Evaluation (Signed)
Anesthesia Post Note  Patient: Kyle Long  Procedure(s) Performed: Procedure(s) (LRB): LEFT SUPRACLAVICAL LYMPH NODE BIOPSY (Left)  Anesthesia type: MAC  Patient location: PACU  Post pain: Pain level controlled and Adequate analgesia  Post assessment: Post-op Vital signs reviewed, Patient's Cardiovascular Status Stable and Respiratory Function Stable  Last Vitals:  Filed Vitals:   10/15/14 0900  BP: 134/64  Pulse:   Temp: 36.6 C  Resp:     Post vital signs: Reviewed and stable  Level of consciousness: awake, alert  and oriented  Complications: No apparent anesthesia complications

## 2014-10-15 NOTE — Transfer of Care (Signed)
Immediate Anesthesia Transfer of Care Note  Patient: Kyle Long  Procedure(s) Performed: Procedure(s) with comments: LEFT SUPRACLAVICAL LYMPH NODE BIOPSY (Left) - LEFT SUPRACLAVICAL LYMPH NODE BIOPSY  Patient Location: PACU  Anesthesia Type:MAC  Level of Consciousness: awake, alert , oriented and patient cooperative  Airway & Oxygen Therapy: Patient Spontanous Breathing and Patient connected to nasal cannula oxygen  Post-op Assessment: Report given to RN, Post -op Vital signs reviewed and stable and Patient moving all extremities  Post vital signs: Reviewed and stable  Last Vitals:  Filed Vitals:   10/15/14 0558  BP: 173/99  Pulse:   Temp:     Complications: No apparent anesthesia complications

## 2014-10-15 NOTE — Interval H&P Note (Signed)
History and Physical Interval Note:  10/15/2014 7:18 AM  Kyle Long  has presented today for surgery, with the diagnosis of LEFT SUPRACLAVICULAR ADENOPATHY  The various methods of treatment have been discussed with the patient and family. After consideration of risks, benefits and other options for treatment, the patient has consented to  Procedure(s): LEFT SUPRACLAVICAL LYMPH NODE BIOPSY (Left) as a surgical intervention .  The patient's history has been reviewed, patient examined, no change in status, stable for surgery.  I have reviewed the patient's chart and labs.  Questions were answered to the patient's satisfaction.     Melrose Nakayama

## 2014-10-15 NOTE — Anesthesia Preprocedure Evaluation (Signed)
Anesthesia Evaluation  Patient identified by MRN, date of birth, ID band Patient awake    Reviewed: Allergy & Precautions, NPO status , Patient's Chart, lab work & pertinent test results  Airway Mallampati: II   Neck ROM: full    Dental   Pulmonary former smoker,  Lung CA         Cardiovascular hypertension,     Neuro/Psych  Neuromuscular disease    GI/Hepatic PUD, GERD-  ,  Endo/Other  obese  Renal/GU      Musculoskeletal  (+) Arthritis -,   Abdominal   Peds  Hematology   Anesthesia Other Findings   Reproductive/Obstetrics                             Anesthesia Physical Anesthesia Plan  ASA: III  Anesthesia Plan: MAC   Post-op Pain Management:    Induction: Intravenous  Airway Management Planned: Simple Face Mask  Additional Equipment:   Intra-op Plan:   Post-operative Plan:   Informed Consent: I have reviewed the patients History and Physical, chart, labs and discussed the procedure including the risks, benefits and alternatives for the proposed anesthesia with the patient or authorized representative who has indicated his/her understanding and acceptance.     Plan Discussed with: CRNA, Anesthesiologist and Surgeon  Anesthesia Plan Comments:         Anesthesia Quick Evaluation

## 2014-10-18 ENCOUNTER — Encounter (HOSPITAL_COMMUNITY): Payer: Self-pay | Admitting: Thoracic Surgery (Cardiothoracic Vascular Surgery)

## 2014-10-21 ENCOUNTER — Encounter: Payer: Self-pay | Admitting: Thoracic Surgery (Cardiothoracic Vascular Surgery)

## 2014-11-09 ENCOUNTER — Encounter: Payer: Self-pay | Admitting: Internal Medicine

## 2014-11-09 ENCOUNTER — Ambulatory Visit (HOSPITAL_BASED_OUTPATIENT_CLINIC_OR_DEPARTMENT_OTHER): Payer: Commercial Managed Care - HMO | Admitting: Internal Medicine

## 2014-11-09 ENCOUNTER — Other Ambulatory Visit (HOSPITAL_BASED_OUTPATIENT_CLINIC_OR_DEPARTMENT_OTHER): Payer: Commercial Managed Care - HMO

## 2014-11-09 ENCOUNTER — Telehealth: Payer: Self-pay | Admitting: Internal Medicine

## 2014-11-09 VITALS — BP 174/90 | HR 79 | Temp 98.0°F | Resp 18 | Ht 70.5 in | Wt 218.7 lb

## 2014-11-09 DIAGNOSIS — C3432 Malignant neoplasm of lower lobe, left bronchus or lung: Secondary | ICD-10-CM | POA: Diagnosis not present

## 2014-11-09 DIAGNOSIS — C8308 Small cell B-cell lymphoma, lymph nodes of multiple sites: Secondary | ICD-10-CM

## 2014-11-09 DIAGNOSIS — C8300 Small cell B-cell lymphoma, unspecified site: Secondary | ICD-10-CM

## 2014-11-09 LAB — COMPREHENSIVE METABOLIC PANEL (CC13)
ALT: 32 U/L (ref 0–55)
ANION GAP: 8 meq/L (ref 3–11)
AST: 27 U/L (ref 5–34)
Albumin: 3.9 g/dL (ref 3.5–5.0)
Alkaline Phosphatase: 66 U/L (ref 40–150)
BILIRUBIN TOTAL: 1.2 mg/dL (ref 0.20–1.20)
BUN: 17.4 mg/dL (ref 7.0–26.0)
CO2: 29 mEq/L (ref 22–29)
CREATININE: 1.3 mg/dL (ref 0.7–1.3)
Calcium: 9.4 mg/dL (ref 8.4–10.4)
Chloride: 100 mEq/L (ref 98–109)
EGFR: 53 mL/min/{1.73_m2} — AB (ref >90)
Glucose: 126 mg/dl (ref 70–140)
POTASSIUM: 4.3 meq/L (ref 3.5–5.1)
Sodium: 137 mEq/L (ref 136–145)
Total Protein: 7.5 g/dL (ref 6.4–8.3)

## 2014-11-09 LAB — CBC WITH DIFFERENTIAL/PLATELET
BASO%: 0.3 % (ref 0.0–2.0)
BASOS ABS: 0 10*3/uL (ref 0.0–0.1)
EOS ABS: 0.3 10*3/uL (ref 0.0–0.5)
EOS%: 3 % (ref 0.0–7.0)
HCT: 41.3 % (ref 38.4–49.9)
HEMOGLOBIN: 14.1 g/dL (ref 13.0–17.1)
LYMPH%: 25.5 % (ref 14.0–49.0)
MCH: 33.1 pg (ref 27.2–33.4)
MCHC: 34.1 g/dL (ref 32.0–36.0)
MCV: 96.9 fL (ref 79.3–98.0)
MONO#: 0.9 10*3/uL (ref 0.1–0.9)
MONO%: 9.5 % (ref 0.0–14.0)
NEUT%: 61.7 % (ref 39.0–75.0)
NEUTROS ABS: 5.5 10*3/uL (ref 1.5–6.5)
PLATELETS: 120 10*3/uL — AB (ref 140–400)
RBC: 4.26 10*6/uL (ref 4.20–5.82)
RDW: 14.8 % — ABNORMAL HIGH (ref 11.0–14.6)
WBC: 8.9 10*3/uL (ref 4.0–10.3)
lymph#: 2.3 10*3/uL (ref 0.9–3.3)

## 2014-11-09 LAB — LACTATE DEHYDROGENASE (CC13): LDH: 215 U/L (ref 125–245)

## 2014-11-09 NOTE — Progress Notes (Signed)
[place] Telephone:(336) [phone]   Fax:(336) [phone]  OFFICE PROGRESS NOTE  [name], MD [address] [place] [address]  DIAGNOSIS AND STAGE:  1) small lymphocytic lymphoma involving the left supraclavicular lymph nodes diagnosed in February 2016. 2) Stage IIA (T1a, N1, M0) non-small cell lung cancer consistent with adenocarcinoma with negative EGFR mutation diagnosed in March of 2013.   PRIOR THERAPY:  1) S/P left video-assisted thoracoscopy, wedge resection of left lower lobe nodule, thoracoscopic left lower lobectomy, mediastinal lymph node dissection on 11/02/2011.  2) Adjuvant chemotherapy with cisplatin 75 mg/M2 and Alimta 500 mg/M2 every 3 weeks. She is status post 4 cycles.   CURRENT THERAPY: Observation.  CHEMOTHERAPY INTENT: Curative/adjuvant  CURRENT # OF CHEMOTHERAPY CYCLES: 0  CURRENT ANTIEMETICS: Compazine  CURRENT SMOKING STATUS: Currently nonsmoker  ORAL CHEMOTHERAPY AND CONSENT: None  CURRENT BISPHOSPHONATES USE: None  PAIN MANAGEMENT: 0/10  NARCOTICS INDUCED CONSTIPATION: N/A  LIVING WILL AND CODE STATUS: Full code initially. INTERVAL HISTORY: Kyle Long 69 y.o. male returns to the clinic today for follow-up visit accompanied by his wife. The patient was recently found to have numerous scattered lymph nodes in the neck, chest, abdomen and pelvis with mild hypermetabolic activity suspicious for lymphoproliferative disorder. He was referred to Dr. Roxan Hockey and on 10/15/2014 he underwent left supraclavicular lymph node biopsy. The final pathology (Accession: [id number]) was consistent with a small lymphocytic lymphoma. The patient is here today for evaluation and discussion of his treatment options. He is feeling fine with no specific complaints except for recent chest congestion and mild cough productive of whitish sputum. He denied having any significant fever or chills. He has no significant weight  loss or night sweats. The patient denied having any significant chest pain, shortness of breath or hemoptysis.   MEDICAL HISTORY: Past Medical History  Diagnosis Date  . Hypertension   . Peyronie disease   . Gilbert's syndrome   . Hyperlipidemia   . Amnesia 2009    isolated;for 30 mins w/elevated BP  . Hematuria 2009    Dr Burnell Blanks  . Hyperglycemia   . Diverticulosis of colon   . Cellulitis 2011    LUE (no PMH of MRSA)/left elbow  . Peptic ulcer 1995  . Recurrent upper respiratory infection (URI)     09/2011  BRONCHITIS  . GERD (gastroesophageal reflux disease)     OCC HEARTBURN  . Neuromuscular disorder     2010 TEMPORARY MEMORY LOSS 1-1.5 HR WAS CHECKED OUT AT [facility] , NOTHING FOUND  . Arthritis   . Cancer     skin / top head  . Lung cancer, lower lobe     Stage IIA (T1,N1)  . Diverticulosis     ALLERGIES:  is allergic to lisinopril.  MEDICATIONS:  Current Outpatient Prescriptions  Medication Sig Dispense Refill  . diltiazem (CARDIZEM CD) 240 MG 24 hr capsule Take 1 capsule (240 mg total) by mouth daily. 90 capsule 1  . HYDROcodone-acetaminophen (NORCO/VICODIN) 5-325 MG per tablet Take 1-2 tablets by mouth every 6 (six) hours as needed (pain). 30 tablet 0  . losartan (COZAAR) 100 MG tablet Take 0.5 tablets (50 mg total) by mouth daily. 45 tablet 1  . metoprolol tartrate (LOPRESSOR) 25 MG tablet Take 1 tablet (25 mg total) by mouth 2 (two) times daily. 180 tablet 3   No current facility-administered medications for this visit.    SURGICAL HISTORY:  Past Surgical History  Procedure Laterality  Date  . Colonoscopy w/ polypectomy  2005    due 2015  . Vasectomy    . Cystoscopy  2009  . Vein surgery      R ankle  . Lobectomy  11/02/2011    Procedure: LOBECTOMY;  Surgeon: Melrose Nakayama, MD;  Location: [facility];  Service: Thoracic;  Laterality: Left;  LEFT LOWER LOBECTOMY  . Refractive surgery      Bil  . Chemo treatment       4 treatments that last 7  hours each/last treatmebt in 2013  . Skin biopsy      4 0r 5  . Supraclavical node biopsy Left 10/15/2014    Procedure: LEFT SUPRACLAVICAL LYMPH NODE BIOPSY;  Surgeon: Melrose Nakayama, MD;  Location: [facility];  Service: Thoracic;  Laterality: Left;  LEFT SUPRACLAVICAL LYMPH NODE BIOPSY    REVIEW OF SYSTEMS:  Constitutional: negative Eyes: negative Ears, nose, mouth, throat, and face: negative Respiratory: positive for cough and sputum Cardiovascular: negative Gastrointestinal: negative Genitourinary:negative Integument/breast: negative Hematologic/lymphatic: negative Musculoskeletal:negative Neurological: negative Behavioral/Psych: negative Endocrine: negative Allergic/Immunologic: negative   PHYSICAL EXAMINATION: General appearance: alert, cooperative and no distress Head: Normocephalic, without obvious abnormality, atraumatic Neck: no adenopathy, no JVD, supple, symmetrical, trachea midline and thyroid not enlarged, symmetric, no tenderness/mass/nodules Lymph nodes: Cervical, supraclavicular, and axillary nodes normal. Resp: clear to auscultation bilaterally Back: symmetric, no curvature. ROM normal. No CVA tenderness. Cardio: regular rate and rhythm, S1, S2 normal, no murmur, click, rub or gallop GI: soft, non-tender; bowel sounds normal; no masses,  no organomegaly Extremities: extremities normal, atraumatic, no cyanosis or edema Neurologic: Alert and oriented X 3, normal strength and tone. Normal symmetric reflexes. Normal coordination and gait  ECOG PERFORMANCE STATUS: 1 - Symptomatic but completely ambulatory  Blood pressure 174/90, pulse 79, temperature 98 F (36.7 C), temperature source Oral, resp. rate 18, height 5' 10.5" (1.791 m), weight 218 lb 11.2 oz (99.202 kg), SpO2 100 %.  LABORATORY DATA: Lab Results  Component Value Date   WBC 8.9 11/09/2014   HGB 14.1 11/09/2014   HCT 41.3 11/09/2014   MCV 96.9 11/09/2014   PLT 120* 11/09/2014      Chemistry        Component Value Date/Time   NA 135 10/12/2014 1032   NA 138 09/06/2014 0928   K 4.0 10/12/2014 1032   K 4.1 09/06/2014 0928   CL 103 10/12/2014 1032   CL 104 09/03/2012 1003   CO2 23 10/12/2014 1032   CO2 26 09/06/2014 0928   BUN 13 10/12/2014 1032   BUN 18.4 09/06/2014 0928   CREATININE 1.44* 10/12/2014 1032   CREATININE 1.3 09/06/2014 0928      Component Value Date/Time   CALCIUM 9.3 10/12/2014 1032   CALCIUM 9.7 09/06/2014 0928   ALKPHOS 57 10/12/2014 1032   ALKPHOS 63 09/06/2014 0928   AST 34 10/12/2014 1032   AST 35* 09/06/2014 0928   ALT 35 10/12/2014 1032   ALT 64* 09/06/2014 0928   BILITOT 1.1 10/12/2014 1032   BILITOT 0.51 09/06/2014 0928       RADIOGRAPHIC STUDIES: Dg Chest 2 View  10/12/2014   CLINICAL DATA:  Supraclavicular adenopathy.  EXAM: CHEST  2 VIEW  COMPARISON:  August 05, 2014.  FINDINGS: The heart size and mediastinal contours are within normal limits. No pneumothorax or pleural effusion is noted. Anterior osteophyte formation is noted in the lower thoracic spine right lung is clear. Stable left basilar scarring is noted.  IMPRESSION: No acute  cardiopulmonary abnormality seen. Stable left basilar scarring.   Electronically Signed   By: Marijo Conception, M.D.   On: 10/12/2014 14:00    ASSESSMENT AND PLAN: This is a very pleasant 69 years old white male with: 1) stage IIA non-small cell lung cancer, adenocarcinoma status post wedge resection of the left lower lobe with lymph node dissection followed by adjuvant chemotherapy with cisplatin and Alimta. He is currently on observation with no evidence for disease recurrence. 2) stage II small lymphocytic lymphoma with multiple small lymph nodes in the neck, chest as well as abdomen and pelvis. The patient is currently asymptomatic. I recommended for him continuous observation for now with repeat CBC, comprehensive metabolic panel and LDH in 6 months. We will consider the patient for imaging studies on an annual  basis for evaluation of his lung cancer as well as a newly diagnosed small lymphocytic lymphoma. He was advised to call immediately if he has any concerning symptoms in the interval. The patient voices understanding of current disease status and treatment options and is in agreement with the current care plan.  All questions were answered. The patient knows to call the clinic with any problems, questions or concerns. We can certainly see the patient much sooner if necessary.  I spent 15 minutes counseling the patient face to face. The total time spent in the appointment was 25 minutes.  Disclaimer: This note was dictated with voice recognition software. Similar sounding words can inadvertently be transcribed and may not be corrected upon review.

## 2014-11-09 NOTE — Telephone Encounter (Signed)
Pt confirmed labs/ov per 04/12 POF, gave pt AVS and Calendar.... KJ  °

## 2014-11-11 ENCOUNTER — Ambulatory Visit (INDEPENDENT_AMBULATORY_CARE_PROVIDER_SITE_OTHER): Payer: Commercial Managed Care - HMO | Admitting: Medical

## 2014-11-11 ENCOUNTER — Encounter: Payer: Self-pay | Admitting: Medical

## 2014-11-11 VITALS — BP 158/90 | HR 112 | Temp 98.3°F | Ht 70.5 in | Wt 216.6 lb

## 2014-11-11 DIAGNOSIS — I10 Essential (primary) hypertension: Secondary | ICD-10-CM | POA: Diagnosis not present

## 2014-11-11 DIAGNOSIS — R05 Cough: Secondary | ICD-10-CM | POA: Diagnosis not present

## 2014-11-11 DIAGNOSIS — R059 Cough, unspecified: Secondary | ICD-10-CM | POA: Insufficient documentation

## 2014-11-11 MED ORDER — LOSARTAN POTASSIUM 100 MG PO TABS: 50.0000 mg | ORAL_TABLET | Freq: Every day | ORAL | Status: DC

## 2014-11-11 MED ORDER — ALBUTEROL SULFATE HFA 108 (90 BASE) MCG/ACT IN AERS: 2.0000 | INHALATION_SPRAY | Freq: Four times a day (QID) | RESPIRATORY_TRACT | Status: DC | PRN

## 2014-11-11 MED ORDER — HYDROCOD POLST-CPM POLST ER 10-8 MG/5ML PO SUER: 5.0000 mL | Freq: Two times a day (BID) | ORAL | Status: DC | PRN

## 2014-11-11 MED ORDER — MOMETASONE FUROATE 50 MCG/ACT NA SUSP: 2.0000 | Freq: Every day | NASAL | Status: DC

## 2014-11-11 MED ORDER — MOMETASONE FUROATE 50 MCG/ACT NA SUSP: NASAL | Status: DC

## 2014-11-11 NOTE — Patient Instructions (Addendum)
Cough Cough persisting for 1-2 months. cxr negative for infection and zpack tx failed.  Some possible allergic component and possible reactive airway component.  Rx nasonex nasal spray.(if not covered then flonase otc.)  Albuterol inhaler. Use as directed for sob, wheezing. But if severe night time cough despite cough syrup use 2 puffs inhaler  and see if give relief for 4-6 hours.   Tussionex cough syrup.  Follow up in 10 days or as needed.  If coughing persisting despite the above consider referral to pulmonologist.     Essential hypertension Better bp on check by myself. Hx of high bp in office and pulse per pt. Asked him to check his bp and hom and pulse and when in for follow up in 10 days bring readings.    Rx advisement given for cough med. Could use 2.5 ml q 12 hours if 78ml too strong/side effects.

## 2014-11-11 NOTE — Assessment & Plan Note (Addendum)
Cough persisting for 1-2 months. cxr negative for infection and zpack tx failed.  Some possible allergic component and possible reactive airway component.  Rx nasonex nasal spray.(if not covered then flonase otc.)  Albuterol inhaler. Use as directed for sob, wheezing. But if severe night time cough despite cough syrup use 2 puffs inhaler  and see if give relief for 4-6 hours.   Tussionex cough syrup.  Follow up in 10 days or as needed.  If coughing persisting despite the above consider referral to pulmonologist.

## 2014-11-11 NOTE — Progress Notes (Signed)
Pre visit review using our clinic review tool, if applicable. No additional management support is needed unless otherwise documented below in the visit note. 

## 2014-11-11 NOTE — Progress Notes (Signed)
Subjective:    Patient ID: Kyle Long, male    DOB: 31-Aug-1945, 69 y.o.   MRN: [id number]  HPI  Pt in with cough for about a month or two. Pt states one month ago Dr. Linna Darner gave him zpack and per his report got cough syrup.   But then cough came back. Day and night cough. Some rattle and wheeze at time. He states more from voice box. Pt quit smoking 36 years ago.   Occasional heart burn.  Pt denies any sneezing or itching eyes. Pt thinks may have some pnd. No obvious congestion  No fever, no chills and no sweats.  No prior use of inhalers.  Pt pulse 79 before left house. BP 148/83.(States bp and pulse always higher when at medical office)  Tuesday cbc looked good. Recent cxr and pet scan.(see last report)     Review of Systems  Constitutional: Negative for fever, chills and fatigue.  HENT: Positive for postnasal drip. Negative for congestion, hearing loss, nosebleeds, sneezing, sore throat and tinnitus.   Respiratory: Positive for cough. Negative for shortness of breath.        Maybe wheeze or rattle.  Cardiovascular: Negative for chest pain and palpitations.  Gastrointestinal: Negative for abdominal pain and anal bleeding.  Endocrine: Negative for polydipsia, polyphagia and polyuria.  Musculoskeletal: Negative for back pain.  Neurological: Negative for headaches.  Hematological: Negative for adenopathy. Does not bruise/bleed easily.  Psychiatric/Behavioral: Negative for behavioral problems and agitation.    Past Medical History  Diagnosis Date  . Hypertension   . Peyronie disease   . Gilbert's syndrome   . Hyperlipidemia   . Amnesia 2009    isolated;for 30 mins w/elevated BP  . Hematuria 2009    Dr Burnell Blanks  . Hyperglycemia   . Diverticulosis of colon   . Cellulitis 2011    LUE (no PMH of MRSA)/left elbow  . Peptic ulcer 1995  . Recurrent upper respiratory infection (URI)     09/2011  BRONCHITIS  . GERD (gastroesophageal reflux disease)     OCC  HEARTBURN  . Neuromuscular disorder     2010 TEMPORARY MEMORY LOSS 1-1.5 HR WAS CHECKED OUT AT [facility] , NOTHING FOUND  . Arthritis   . Cancer     skin / top head  . Lung cancer, lower lobe     Stage IIA (T1,N1)  . Diverticulosis     History   Social History  . Marital Status: Married    Spouse Name: N/A  . Number of Children: N/A  . Years of Education: N/A   Occupational History  . retired    Social History Main Topics  . Smoking status: Former Smoker -- 1.00 packs/day for 15 years    Quit date: 07/31/1979  . Smokeless tobacco: Never Used     Comment: Smoked age 47 - 47 , up to 1 ppd  . Alcohol Use: 6.6 oz/week    2 Glasses of wine, 9 Shots of liquor per week     Comment: Daily  . Drug Use: No  . 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   Other Topics Concern  . Not on file   Social History Narrative   Reg exercise    Past Surgical History  Procedure Laterality Date  . Colonoscopy w/ polypectomy  2005    due 2015  . Vasectomy    . Cystoscopy  2009  . Vein surgery      R ankle  . Lobectomy  11/02/2011    Procedure: LOBECTOMY;  Surgeon: Melrose Nakayama, MD;  Location: [facility];  Service: Thoracic;  Laterality: Left;  LEFT LOWER LOBECTOMY  . Refractive surgery      Bil  . Chemo treatment       4 treatments that last 7 hours each/last treatmebt in 2013  . Skin biopsy      4 0r 5  . Supraclavical node biopsy Left 10/15/2014    Procedure: LEFT SUPRACLAVICAL LYMPH NODE BIOPSY;  Surgeon: Melrose Nakayama, MD;  Location: [facility];  Service: Thoracic;  Laterality: Left;  LEFT SUPRACLAVICAL LYMPH NODE BIOPSY    Family History  Problem Relation Age of Onset  . Stroke Mother     in 24s  . Hypertension Mother   . Breast cancer Mother   . Neuropathy Mother   . Melanoma Father   . Heart attack Father 29    stents  . Hypertension Sister   . Thyroid disease Sister       ??  . Multiple myeloma Sister   . Cancer Sister   . Diabetes Paternal Aunt   . Stomach cancer Maternal Grandfather   . Lung cancer Maternal Grandfather   . Cancer Paternal Grandmother     lung, nonsmoker  . Headache Daughter     Allergies  Allergen Reactions  . Lisinopril     cough    Current Outpatient Prescriptions on File Prior to Visit  Medication Sig Dispense Refill  . diltiazem (CARDIZEM CD) 240 MG 24 hr capsule Take 1 capsule (240 mg total) by mouth daily. 90 capsule 1  . metoprolol tartrate (LOPRESSOR) 25 MG tablet Take 1 tablet (25 mg total) by mouth 2 (two) times daily. 180 tablet 3   No current facility-administered medications on file prior to visit.    BP 158/90 mmHg  Pulse 112  Temp(Src) 98.3 F (36.8 C) (Oral)  Ht 5' 10.5" (1.791 m)  Wt 216 lb 9.6 oz (98.249 kg)  BMI 30.63 kg/m2  SpO2 97%        Objective:   Physical Exam  General Mental Status- Alert. General Appearance- Not in acute distress.     HEENT Head- Normal. Ear Auditory Canal - Left- Normal. Right - Normal.Tympanic Membrane- Left- Normal. Right- Normal. Eye Sclera/Conjunctiva- Left- Normal. Right- Normal. Nose & Sinuses Nasal Mucosa- Left-  Boggy and Congested. Right-  Boggy and  Congested.Bilateral maxillary and frontal sinus pressure. Mouth & Throat Lips: Upper Lip- Normal: no dryness, cracking, pallor, cyanosis, or vesicular eruption. Lower Lip-Normal: no dryness, cracking, pallor, cyanosis or vesicular eruption. Buccal Mucosa- Bilateral- No Aphthous ulcers. Oropharynx- No Discharge or Erythema. Tonsils: Characteristics- Bilateral- No Erythema or Congestion. Size/Enlargement- Bilateral- No enlargement. Discharge- bilateral-None.  Neck Neck- Supple. No Masses.     Chest and Lung Exam Auscultation: Breath Sounds:-even, and mild shallow respiration bilateral.  Cardiovascular Auscultation:Rythm- Regular, rate and rhythm  Murmurs & Other Heart Sounds:Auscultation of the  heart reveals- No Murmurs.  Abdomen Inspection:-Inspeection Normal. Palpation/Percussion:Note:No mass. Palpation and Percussion of the abdomen reveal- Non Tender, Non Distended + BS, no rebound or guarding.    Neurologic Cranial Nerve exam:- CN III-XII intact(No nystagmus), symmetric smile. Strength:- 5/5 equal and symmetric strength both upper and lower extremities.       Assessment & Plan:

## 2014-11-11 NOTE — Assessment & Plan Note (Addendum)
Better bp on check by myself. Hx of high bp in office and pulse per pt. Asked him to check his bp  and pulse at home  and when in for follow up in 10 days bring readings.

## 2014-11-15 ENCOUNTER — Other Ambulatory Visit: Payer: Self-pay | Admitting: Internal Medicine

## 2014-11-23 ENCOUNTER — Ambulatory Visit (HOSPITAL_BASED_OUTPATIENT_CLINIC_OR_DEPARTMENT_OTHER)
Admission: RE | Admit: 2014-11-23 | Discharge: 2014-11-23 | Disposition: A | Discharge status: Home / Self Care | Payer: Commercial Managed Care - HMO | Source: Ambulatory Visit | Attending: Medical | Admitting: Medical

## 2014-11-23 ENCOUNTER — Ambulatory Visit (INDEPENDENT_AMBULATORY_CARE_PROVIDER_SITE_OTHER): Payer: Commercial Managed Care - HMO | Admitting: Medical

## 2014-11-23 ENCOUNTER — Encounter: Payer: Self-pay | Admitting: Medical

## 2014-11-23 ENCOUNTER — Telehealth: Payer: Self-pay | Admitting: Medical

## 2014-11-23 VITALS — BP 152/92 | HR 91 | Temp 98.3°F | Ht 70.0 in | Wt 211.8 lb

## 2014-11-23 DIAGNOSIS — R05 Cough: Secondary | ICD-10-CM | POA: Insufficient documentation

## 2014-11-23 DIAGNOSIS — R918 Other nonspecific abnormal finding of lung field: Secondary | ICD-10-CM | POA: Diagnosis not present

## 2014-11-23 DIAGNOSIS — R059 Cough, unspecified: Secondary | ICD-10-CM

## 2014-11-23 MED ORDER — AZITHROMYCIN 250 MG PO TABS: ORAL_TABLET | ORAL | Status: DC

## 2014-11-23 MED ORDER — HYDROCOD POLST-CPM POLST ER 10-8 MG/5ML PO SUER: 5.0000 mL | Freq: Two times a day (BID) | ORAL | Status: DC | PRN

## 2014-11-23 MED ORDER — BUDESONIDE-FORMOTEROL FUMARATE 80-4.5 MCG/ACT IN AERO: 2.0000 | INHALATION_SPRAY | Freq: Two times a day (BID) | RESPIRATORY_TRACT | Status: DC

## 2014-11-23 MED ORDER — MONTELUKAST SODIUM 10 MG PO TABS: 10.0000 mg | ORAL_TABLET | Freq: Every day | ORAL | Status: DC

## 2014-11-23 NOTE — Assessment & Plan Note (Signed)
I think multiple causes. Mild allergies with pnd, copd/RAD, and possible   recurrent infection.  Get cxr today. Refer to pulmonologist to further evaluate persisting cough. Rx of symbicort. Refill of tussionex. Rx montelukast. Rx azithromycin for possible reoccuring infection since last treated in January.  Follow up as needed prior to pulmonologist. Will ask referral staff to try to get you in within 3 wks

## 2014-11-23 NOTE — Patient Instructions (Signed)
Get cxr today. Refer to pulmonologist to further evaluate persisting cough. Rx of symbicort. Refill of tussionex. Rx montelukast. Rx azithromycin for possible reoccuring infection since last treated in January.  Follow up as needed prior to pulmonologist. Will ask referral staff to try to get you in within 3 wks.

## 2014-11-23 NOTE — Progress Notes (Signed)
Pre visit review using our clinic review tool, if applicable. No additional management support is needed unless otherwise documented below in the visit note. 

## 2014-11-23 NOTE — Telephone Encounter (Signed)
Xray order repeat in 10 days.

## 2014-11-23 NOTE — Progress Notes (Signed)
Subjective:    Patient ID: Kyle Long, male    DOB: 1945-09-24, 69 y.o.   MRN: [id number]  HPI  Pt still has cough which he has had now for almost 2 months(possible more since original rx of antibiotic and for cough was in [place].. The tussionex helps but makes him too drowsy. Pt cut back to 2.5 ml every 12 hours. He still some coughing at night. He is having some wheezing at night. Pt states wheeze some for entire 2 months . Hx of lung cancer on the left side. Pt is being followed by oncologist. Lymph nodes mildly swollen. Pt states albuterol does stop wheeze but not  the cough.35 years since he smoked.      Review of Systems  Constitutional: Negative for fever, chills and fatigue.  HENT: Positive for postnasal drip. Negative for congestion, ear discharge, nosebleeds, sore throat and tinnitus.   Respiratory: Positive for cough and wheezing. Negative for chest tightness and shortness of breath.        Occasional productive cough.  Cardiovascular: Negative for chest pain and palpitations.  Musculoskeletal: Negative for back pain.  Neurological: Negative.   Hematological: Negative for adenopathy. Does not bruise/bleed easily.   Past Medical History  Diagnosis Date  . Hypertension   . Peyronie disease   . Gilbert's syndrome   . Hyperlipidemia   . Amnesia 2009    isolated;for 30 mins w/elevated BP  . Hematuria 2009    Dr Burnell Blanks  . Hyperglycemia   . Diverticulosis of colon   . Cellulitis 2011    LUE (no PMH of MRSA)/left elbow  . Peptic ulcer 1995  . Recurrent upper respiratory infection (URI)     09/2011  BRONCHITIS  . GERD (gastroesophageal reflux disease)     OCC HEARTBURN  . Neuromuscular disorder     2010 TEMPORARY MEMORY LOSS 1-1.5 HR WAS CHECKED OUT AT [facility] , NOTHING FOUND  . Arthritis   . Cancer     skin / top head  . Lung cancer, lower lobe     Stage IIA (T1,N1)  . Diverticulosis     History   Social History  . Marital Status: Married   Spouse Name: N/A  . Number of Children: N/A  . Years of Education: N/A   Occupational History  . retired    Social History Main Topics  . Smoking status: Former Smoker -- 1.00 packs/day for 15 years    Quit date: 07/31/1979  . Smokeless tobacco: Never Used     Comment: Smoked age 30 - 32 , up to 1 ppd  . Alcohol Use: 6.6 oz/week    2 Glasses of wine, 9 Shots of liquor per week     Comment: Daily  . Drug Use: No  . 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   Other Topics Concern  . Not on file   Social History Narrative   Reg exercise    Past Surgical History  Procedure Laterality Date  . Colonoscopy w/ polypectomy  2005    due 2015  . Vasectomy    . Cystoscopy  2009  . Vein surgery      R ankle  . Lobectomy  11/02/2011    Procedure: LOBECTOMY;  Surgeon: Melrose Nakayama, MD;  Location: [facility];  Service: Thoracic;  Laterality: Left;  LEFT LOWER LOBECTOMY  . Refractive surgery  Bil  . Chemo treatment       4 treatments that last 7 hours each/last treatmebt in 2013  . Skin biopsy      4 0r 5  . Supraclavical node biopsy Left 10/15/2014    Procedure: LEFT SUPRACLAVICAL LYMPH NODE BIOPSY;  Surgeon: Melrose Nakayama, MD;  Location: [facility];  Service: Thoracic;  Laterality: Left;  LEFT SUPRACLAVICAL LYMPH NODE BIOPSY    Family History  Problem Relation Age of Onset  . Stroke Mother     in 16s  . Hypertension Mother   . Breast cancer Mother   . Neuropathy Mother   . Melanoma Father   . Heart attack Father 43    stents  . Hypertension Sister   . Thyroid disease Sister     ??  . Multiple myeloma Sister   . Cancer Sister   . Diabetes Paternal Aunt   . Stomach cancer Maternal Grandfather   . Lung cancer Maternal Grandfather   . Cancer Paternal Grandmother     lung, nonsmoker  . Headache Daughter     Allergies  Allergen Reactions  . Lisinopril     cough     Current Outpatient Prescriptions on File Prior to Visit  Medication Sig Dispense Refill  . albuterol (PROVENTIL HFA;VENTOLIN HFA) 108 (90 BASE) MCG/ACT inhaler Inhale 2 puffs into the lungs every 6 (six) hours as needed for wheezing or shortness of breath. 1 Inhaler 0  . chlorpheniramine-HYDROcodone (TUSSIONEX PENNKINETIC ER) 10-8 MG/5ML SUER Take 5 mLs by mouth every 12 (twelve) hours as needed for cough. 140 mL 0  . diltiazem (CARDIZEM CD) 240 MG 24 hr capsule Take 1 capsule (240 mg total) by mouth daily. 90 capsule 1  . losartan (COZAAR) 100 MG tablet Take 0.5 tablets (50 mg total) by mouth daily. 45 tablet 3  . metoprolol tartrate (LOPRESSOR) 25 MG tablet Take 1 tablet (25 mg total) by mouth 2 (two) times daily. 180 tablet 3  . mometasone (NASONEX) 50 MCG/ACT nasal spray Place 2 sprays into the nose daily. 17 g 12   No current facility-administered medications on file prior to visit.    BP 152/92 mmHg  Pulse 91  Temp(Src) 98.3 F (36.8 C) (Oral)  Ht _0  (1.778 m)  Wt 211 lb 12.8 oz (96.072 kg)  BMI 30.39 kg/m2  SpO2 95%      Objective:   Physical Exam  General  Mental Status - Alert. General Appearance - Well groomed. Not in acute distress.  Skin Rashes- No Rashes.  HEENT Head- Normal. Ear Auditory Canal - Left- Normal. Right - Normal.Tympanic Membrane- Left- Normal. Right- Normal. Eye Sclera/Conjunctiva- Left- Normal. Right- Normal. Nose & Sinuses Nasal Mucosa- Left-  Boggy and Congested. Right-  Boggy and  Congested.Bilateral  No maxillary and  No frontal sinus pressure. Mouth & Throat Lips: Upper Lip- Normal: no dryness, cracking, pallor, cyanosis, or vesicular eruption. Lower Lip-Normal: no dryness, cracking, pallor, cyanosis or vesicular eruption. Buccal Mucosa- Bilateral- No Aphthous ulcers. Oropharynx- No Discharge or Erythema. +pnd. Tonsils: Characteristics- Bilateral- No Erythema or Congestion. Size/Enlargement- Bilateral- No enlargement. Discharge-  bilateral-None.  Neck Neck- Supple. No Masses.   Chest and Lung Exam Auscultation: Breath Sounds:-even and unlabored. But shallow and scattered rhonchi.  Cardiovascular Auscultation:Rythm- Regular, rate and rhythm. Murmurs & Other Heart Sounds:Ausculatation of the heart reveal- No Murmurs.  Lymphatic Head & Neck General Head & Neck Lymphatics: Bilateral: Description- No Localized lymphadenopathy.       Assessment & Plan:

## 2014-11-29 ENCOUNTER — Telehealth: Payer: Self-pay | Admitting: Family Medicine

## 2014-11-29 DIAGNOSIS — R918 Other nonspecific abnormal finding of lung field: Secondary | ICD-10-CM

## 2014-11-29 MED ORDER — AZITHROMYCIN 250 MG PO TABS: ORAL_TABLET | ORAL | Status: DC

## 2014-11-29 NOTE — Telephone Encounter (Signed)
Relation to pt: self  Call back number: [phone] Pharmacy: [facility], [place] [phone] (Phone) [phone] (Fax)        Reason for call:  Pt was advised by PA to have a repeat chest x ray in 10 days pt requesting orders (pt will be out of town next weekend. Pt also requesting a refill azithromycin (ZITHROMAX) 250 MG tablet. Please advise

## 2014-12-02 ENCOUNTER — Other Ambulatory Visit (INDEPENDENT_AMBULATORY_CARE_PROVIDER_SITE_OTHER): Payer: Commercial Managed Care - HMO

## 2014-12-02 ENCOUNTER — Ambulatory Visit (HOSPITAL_BASED_OUTPATIENT_CLINIC_OR_DEPARTMENT_OTHER)
Admission: RE | Admit: 2014-12-02 | Discharge: 2014-12-02 | Disposition: A | Discharge status: Home / Self Care | Payer: Commercial Managed Care - HMO | Source: Ambulatory Visit | Attending: Medical | Admitting: Medical

## 2014-12-02 DIAGNOSIS — E782 Mixed hyperlipidemia: Secondary | ICD-10-CM | POA: Diagnosis not present

## 2014-12-02 DIAGNOSIS — I10 Essential (primary) hypertension: Secondary | ICD-10-CM | POA: Diagnosis not present

## 2014-12-02 DIAGNOSIS — I517 Cardiomegaly: Secondary | ICD-10-CM | POA: Diagnosis not present

## 2014-12-02 DIAGNOSIS — M109 Gout, unspecified: Secondary | ICD-10-CM | POA: Diagnosis not present

## 2014-12-02 DIAGNOSIS — R05 Cough: Secondary | ICD-10-CM | POA: Insufficient documentation

## 2014-12-02 DIAGNOSIS — R918 Other nonspecific abnormal finding of lung field: Secondary | ICD-10-CM

## 2014-12-02 LAB — COMPREHENSIVE METABOLIC PANEL
ALBUMIN: 4 g/dL (ref 3.5–5.2)
ALT: 28 U/L (ref 0–53)
AST: 22 U/L (ref 0–37)
Alkaline Phosphatase: 62 U/L (ref 39–117)
BUN: 18 mg/dL (ref 6–23)
CO2: 28 mEq/L (ref 19–32)
CREATININE: 1.41 mg/dL (ref 0.40–1.50)
Calcium: 9.9 mg/dL (ref 8.4–10.5)
Chloride: 102 mEq/L (ref 96–112)
GFR: 52.72 mL/min — ABNORMAL LOW (ref >60.00)
GLUCOSE: 116 mg/dL — AB (ref 70–99)
Potassium: 3.8 mEq/L (ref 3.5–5.1)
SODIUM: 136 meq/L (ref 135–145)
Total Bilirubin: 0.8 mg/dL (ref 0.2–1.2)
Total Protein: 7.6 g/dL (ref 6.0–8.3)

## 2014-12-02 LAB — CBC
HCT: 39.2 % (ref 39.0–52.0)
Hemoglobin: 13.5 g/dL (ref 13.0–17.0)
MCHC: 34.4 g/dL (ref 30.0–36.0)
MCV: 92.8 fl (ref 78.0–100.0)
Platelets: 155 10*3/uL (ref 150.0–400.0)
RBC: 4.23 Mil/uL (ref 4.22–5.81)
RDW: 13.9 % (ref 11.5–15.5)
WBC: 6.9 10*3/uL (ref 4.0–10.5)

## 2014-12-02 LAB — LIPID PANEL
CHOL/HDL RATIO: 3
Cholesterol: 146 mg/dL (ref 0–200)
HDL: 42.8 mg/dL (ref >39.00)
LDL Cholesterol: 71 mg/dL (ref 0–99)
NONHDL: 103.2
Triglycerides: 162 mg/dL — ABNORMAL HIGH (ref 0.0–149.0)
VLDL: 32.4 mg/dL (ref 0.0–40.0)

## 2014-12-02 LAB — URIC ACID: Uric Acid, Serum: 7.5 mg/dL (ref 4.0–7.8)

## 2014-12-02 LAB — TSH: TSH: 1.66 u[IU]/mL (ref 0.35–4.50)

## 2014-12-03 ENCOUNTER — Encounter: Payer: Self-pay | Admitting: Family Medicine

## 2014-12-13 ENCOUNTER — Ambulatory Visit (INDEPENDENT_AMBULATORY_CARE_PROVIDER_SITE_OTHER): Payer: Commercial Managed Care - HMO | Admitting: Internal Medicine

## 2014-12-13 ENCOUNTER — Encounter: Payer: Self-pay | Admitting: Internal Medicine

## 2014-12-13 VITALS — BP 170/106 | HR 104 | Ht 70.5 in | Wt 216.0 lb

## 2014-12-13 DIAGNOSIS — R05 Cough: Secondary | ICD-10-CM | POA: Diagnosis not present

## 2014-12-13 DIAGNOSIS — R0600 Dyspnea, unspecified: Secondary | ICD-10-CM

## 2014-12-13 DIAGNOSIS — Z23 Encounter for immunization: Secondary | ICD-10-CM | POA: Diagnosis not present

## 2014-12-13 DIAGNOSIS — R059 Cough, unspecified: Secondary | ICD-10-CM

## 2014-12-13 DIAGNOSIS — I10 Essential (primary) hypertension: Secondary | ICD-10-CM | POA: Diagnosis not present

## 2014-12-13 NOTE — Progress Notes (Addendum)
Subjective:    Patient ID: Kyle Long, male    DOB: Mar 26, 1946     MRN: [id number]  HPI  9 yowm quit smoking 1981 with no symptoms despite LLLobectomy / chemo 2013 and still no resp complaints until winter of 2016(? Late jan)  with acute onset of ?viral Samuel [place] then chronic cough /sob so referred to pulmonary clinic 12/13/2014 by [name].  Of note pt is under the care of Dr Earlie Server with last note dated 11/09/14   DIAGNOSIS AND STAGE:  1) small lymphocytic lymphoma involving the left supraclavicular lymph nodes diagnosed in February 2016. 2) Stage IIA (T1a, N1, M0) non-small cell lung cancer consistent with adenocarcinoma with negative EGFR mutation diagnosed in March of 2013.  rec no further rx   PRIOR THERAPY:  1) S/P left video-assisted thoracoscopy, wedge resection of left lower lobe nodule, thoracoscopic left lower lobectomy, mediastinal lymph node dissection on 11/02/2011.  2) Adjuvant chemotherapy with cisplatin 75 mg/M2 and Alimta 500 mg/M2 every 3 weeks. She is status post 4 cycles.  rec f/u q 6 m s rx    12/13/2014 1st [facility] office visit/ Wert   Chief Complaint  Patient presents with  . Pulmonary Consult    Referred by Evern Core. Pt c/o SOB and cough for the past 2 months- had had 2 rounds of zithromax and symptoms have improved.   cough is gone, still feels the urge to clear throat though, mostly daytime, not at all disturbing sleep.  Stopped both symbicort and singulair about 3 days prior to OV s flare so far- doesn't think saba helped and not sure about symb or singulair but worried about effect on hbp  Was doing treadmill   2 miles in 30 min s  incline prior to onset of cough in Jan 2016 but afraid to start back and says sob just carrying groceries in from car now, ever since acute illness in Jan 2016 and though the cough is gone the doe is not.  No resting or not symptoms  No  assoc   cp or chest tightness, subjective wheeze overt sinus or hb  symptoms. No unusual exp hx or h/o childhood pna/ asthma or knowledge of premature birth.  Sleeping ok without nocturnal  or early am exacerbation  of respiratory  c/o's or need for noct saba. Also denies any obvious fluctuation of symptoms with weather or environmental changes or other aggravating or alleviating factors except as outlined above   Current Medications, Allergies, Complete Past Medical History, Past Surgical History, Family History, and Social History were reviewed in [employer] record.  ROS  The following are not active complaints unless bolded sore throat, dysphagia, dental problems, itching, sneezing,  nasal congestion or excess/ purulent secretions, ear ache,   fever, chills, sweats, unintended wt loss, pleuritic or exertional cp, hemoptysis,  orthopnea pnd or leg swelling, presyncope, palpitations, heartburn, abdominal pain, anorexia, nausea, vomiting, diarrhea  or change in bowel or urinary habits, change in stools or urine, dysuria,hematuria,  rash, arthralgias, visual complaints, headache, numbness weakness or ataxia or problems with walking or coordination,  change in mood/affect or memory.          Review of Systems  Constitutional: Negative for fever, chills, activity change, appetite change and unexpected weight change.  HENT: Negative for congestion, dental problem, postnasal drip, rhinorrhea, sneezing, sore throat, trouble swallowing and voice change.   Eyes: Negative for visual disturbance.  Respiratory: Negative for cough, choking and shortness of  breath.   Cardiovascular: Negative for chest pain and leg swelling.  Gastrointestinal: Negative for nausea, vomiting and abdominal pain.  Genitourinary: Negative for difficulty urinating.  Musculoskeletal: Negative for arthralgias.  Skin: Negative for rash.  Psychiatric/Behavioral: Negative for behavioral problems and confusion.       Objective:   Physical Exam  amb pleasant wm nad  Wt  Readings from Last 3 Encounters:  12/13/14 216 lb (97.977 kg)  11/23/14 211 lb 12.8 oz (96.072 kg)  11/11/14 216 lb 9.6 oz (98.249 kg)    Vital signs reviewed   HEENT: nl dentition, turbinates, and orophanx. Nl external ear canals without cough reflex   NECK :  without JVD/Nodes/TM/ nl carotid upstrokes bilaterally   LUNGS: no acc muscle use, decreased bs in L base, otherwise clear bilaterally     CV:  RRR  no s3 or murmur or increase in P2, no edema   ABD:  soft and nontender with nl excursion in the supine position. No bruits or organomegaly, bowel sounds nl  MS:  warm without deformities, calf tenderness, cyanosis or clubbing  SKIN: warm and dry without lesions    NEURO:  alert, approp, no deficits   I personally reviewed images and agree with radiology impression as follows:  CXR:  12/02/14  1. Stable left base pleural parenchymal thickening consistent scarring. Stable cardiomegaly. 2. No acute cardiopulmonary disease.           Assessment & Plan:

## 2014-12-13 NOTE — Assessment & Plan Note (Signed)
The most common causes of chronic cough in immunocompetent adults include the following: upper airway cough syndrome (UACS), previously referred to as postnasal drip syndrome (PNDS), which is caused by variety of rhinosinus conditions; (2) asthma; (3) GERD; (4) chronic bronchitis from cigarette smoking or other inhaled environmental irritants; (5) nonasthmatic eosinophilic bronchitis; and (6) bronchiectasis.   These conditions, singly or in combination, have accounted for up to 94% of the causes of chronic cough in prospective studies.   Other conditions have constituted no >6% of the causes in prospective studies These have included bronchogenic carcinoma, chronic interstitial pneumonia, sarcoidosis, left ventricular failure, ACEI-induced cough, and aspiration from a condition associated with pharyngeal dysfunction.    Chronic cough is often simultaneously caused by more than one condition. A single cause has been found from 38 to 82% of the time, multiple causes from 18 to 62%. Multiply caused cough has been the result of three diseases up to 42% of the time.       At this point he's convinced the cough is gone and just stopped symbicort and singulair s flare > rec restart both in short run if worsens off (explained the medications both have lingering benefits for up to a week p d/c)

## 2014-12-13 NOTE — Patient Instructions (Addendum)
Resume treadmill three times a week a work up 30 min   prevnar 13 now and flu vaccination each fall.   Please schedule a follow up office visit in 6 weeks, call sooner if needed with pfts

## 2014-12-13 NOTE — Assessment & Plan Note (Signed)
Not Adequate control on present rx, reviewed options > several concerns here  1) if beta blockers used in higher doses, Strongly prefer in this setting: Bystolic, the most beta -1  selective Beta blocker available in sample form, with bisoprolol the most selective generic choice  on the market.  2) For reasons that may related to vascular permability and nitric oxide pathways but not elevated  bradykinin levels (as seen with  ACEi use) losartan in the generic form has been reported now from mulitple sources  to cause a similar pattern of non-specific  upper airway symptoms as seen with acei.   This has not been reported with exposure to the other ARB's to date, so it may be necessary to stop the losartan and change to alternative arb if the cough becomes refractory again.  See:  Lelon Frohlich Allergy Asthma Immunol  2008: 101: p 449-675

## 2014-12-13 NOTE — Assessment & Plan Note (Signed)
-   12/13/2014  Walked RA x 3 laps @ 185 ft each stopped due to  End of study, nl pace, no desat or sob   Unclear why he's now reporting so sob with adls as we could not produce this today and suspect this is mostly deconditioning and anxiety combined and all he needs to do at this point is stay on present rx and f/u with pfts in 4-6 weeks but no further asthma/ copd rx unless symptoms flare off rx.   Discussed with pt Unlike when you get a prescription for eyeglasses, it's not possible to always walk out of this or any medical office with a perfect prescription that is immediately effective  based on any test that we offer here.    On the contrary, it may take several weeks for the full impact of changes already made (d/c symb and singulair)  - hopefully you will respond well.  If not, then we'll adjust your medication on your next visit accordingly, knowing more then than we can possibly know now.

## 2014-12-29 ENCOUNTER — Other Ambulatory Visit: Payer: Commercial Managed Care - HMO

## 2015-01-13 ENCOUNTER — Encounter: Payer: Self-pay | Admitting: Family Medicine

## 2015-01-13 ENCOUNTER — Ambulatory Visit (INDEPENDENT_AMBULATORY_CARE_PROVIDER_SITE_OTHER): Payer: Commercial Managed Care - HMO | Admitting: Family Medicine

## 2015-01-13 VITALS — BP 138/82 | HR 73 | Temp 98.6°F | Ht 70.5 in | Wt 217.0 lb

## 2015-01-13 DIAGNOSIS — C3432 Malignant neoplasm of lower lobe, left bronchus or lung: Secondary | ICD-10-CM | POA: Diagnosis not present

## 2015-01-13 DIAGNOSIS — I10 Essential (primary) hypertension: Secondary | ICD-10-CM | POA: Diagnosis not present

## 2015-01-13 DIAGNOSIS — N400 Enlarged prostate without lower urinary tract symptoms: Secondary | ICD-10-CM

## 2015-01-13 DIAGNOSIS — N289 Disorder of kidney and ureter, unspecified: Secondary | ICD-10-CM

## 2015-01-13 DIAGNOSIS — C8300 Small cell B-cell lymphoma, unspecified site: Secondary | ICD-10-CM

## 2015-01-13 DIAGNOSIS — R000 Tachycardia, unspecified: Secondary | ICD-10-CM

## 2015-01-13 DIAGNOSIS — E782 Mixed hyperlipidemia: Secondary | ICD-10-CM

## 2015-01-13 DIAGNOSIS — N486 Induration penis plastica: Secondary | ICD-10-CM

## 2015-01-13 DIAGNOSIS — R739 Hyperglycemia, unspecified: Secondary | ICD-10-CM

## 2015-01-13 MED ORDER — METOPROLOL TARTRATE 25 MG PO TABS: 25.0000 mg | ORAL_TABLET | Freq: Two times a day (BID) | ORAL | Status: DC

## 2015-01-13 MED ORDER — DILTIAZEM HCL ER COATED BEADS 240 MG PO CP24: 240.0000 mg | ORAL_CAPSULE | Freq: Every day | ORAL | Status: DC

## 2015-01-13 NOTE — Progress Notes (Signed)
Kyle Long  [id number] September 05, 1945 01/13/2015      Progress Note-Follow Up  Subjective  Chief Complaint  Chief Complaint  Patient presents with  . Follow-up    4 month    HPI  Patient is a 69 y.o. male in today for routine medical care. Patient is in today for follow-up. Is requesting to referrals for insurance purposes. Needs referral back to his long-standing nephrologist Dr. Kris Mouton as well as his oncologist Dr. Earlie Server. Has been struggling with a cough recently but it has improved at this time. Actually has had symptoms about 6 weeks but feels better than he's felt for quite some time. No other recent illness or acute complaints. Denies CP/palp/SOB/HA/congestion/fevers/GI or GU c/o. Taking meds as prescribed  Past Medical History  Diagnosis Date  . Hypertension   . Peyronie disease   . Gilbert's syndrome   . Hyperlipidemia   . Amnesia 2009    isolated;for 30 mins w/elevated BP  . Hematuria 2009    Dr Burnell Blanks  . Hyperglycemia   . Diverticulosis of colon   . Cellulitis 2011    LUE (no PMH of MRSA)/left elbow  . Peptic ulcer 1995  . Recurrent upper respiratory infection (URI)     09/2011  BRONCHITIS  . GERD (gastroesophageal reflux disease)     OCC HEARTBURN  . Neuromuscular disorder     2010 TEMPORARY MEMORY LOSS 1-1.5 HR WAS CHECKED OUT AT [facility] , NOTHING FOUND  . Arthritis   . Cancer     skin / top head  . Lung cancer, lower lobe     Stage IIA (T1,N1)  . Diverticulosis     Past Surgical History  Procedure Laterality Date  . Colonoscopy w/ polypectomy  2005    due 2015  . Vasectomy    . Cystoscopy  2009  . Vein surgery      R ankle  . Lobectomy  11/02/2011    Procedure: LOBECTOMY;  Surgeon: Melrose Nakayama, MD;  Location: [facility];  Service: Thoracic;  Laterality: Left;  LEFT LOWER LOBECTOMY  . Refractive surgery      Bil  . Chemo treatment       4 treatments that last 7 hours each/last treatmebt in 2013  . Skin biopsy      4 0r 5    . Supraclavical node biopsy Left 10/15/2014    Procedure: LEFT SUPRACLAVICAL LYMPH NODE BIOPSY;  Surgeon: Melrose Nakayama, MD;  Location: [facility];  Service: Thoracic;  Laterality: Left;  LEFT SUPRACLAVICAL LYMPH NODE BIOPSY    Family History  Problem Relation Age of Onset  . Stroke Mother     in 29s  . Hypertension Mother   . Breast cancer Mother   . Neuropathy Mother   . Melanoma Father   . Heart attack Father 19    stents  . Hypertension Sister   . Thyroid disease Sister     ??  . Multiple myeloma Sister   . Cancer Sister   . Diabetes Paternal Aunt   . Stomach cancer Maternal Grandfather   . Lung cancer Maternal Grandfather   . Cancer Paternal Grandmother     lung, nonsmoker  . Headache Daughter     History   Social History  . Marital Status: Married    Spouse Name: N/A  . Number of Children: N/A  . Years of Education: N/A   Occupational History  . retired    Social History Main Topics  .  Smoking status: Former Smoker -- 1.00 packs/day for 15 years    Quit date: 07/31/1979  . Smokeless tobacco: Never Used  . Alcohol Use: 6.6 oz/week    2 Glasses of wine, 9 Shots of liquor per week     Comment: Daily  . Drug Use: No  . 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   Other Topics Concern  . Not on file   Social History Narrative   Reg exercise    Current Outpatient Prescriptions on File Prior to Visit  Medication Sig Dispense Refill  . losartan (COZAAR) 100 MG tablet Take 0.5 tablets (50 mg total) by mouth daily. 45 tablet 3   No current facility-administered medications on file prior to visit.    Allergies  Allergen Reactions  . Lisinopril     cough    Review of Systems  Review of Systems  Constitutional: Negative for fever and malaise/fatigue.  HENT: Negative for congestion.   Eyes: Negative for discharge.  Respiratory: Negative for shortness of  breath.   Cardiovascular: Negative for chest pain, palpitations and leg swelling.  Gastrointestinal: Negative for nausea, abdominal pain and diarrhea.  Genitourinary: Negative for dysuria.  Musculoskeletal: Negative for falls.  Skin: Negative for rash.  Neurological: Negative for loss of consciousness and headaches.  Endo/Heme/Allergies: Negative for polydipsia.  Psychiatric/Behavioral: Negative for depression and suicidal ideas. The patient is not nervous/anxious and does not have insomnia.     Objective  BP 138/82 mmHg  Pulse 73  Temp(Src) 98.6 F (37 C) (Oral)  Ht 5' 10.5" (1.791 m)  Wt 217 lb (98.431 kg)  BMI 30.69 kg/m2  SpO2 98%  Physical Exam  Physical Exam  Constitutional: He is oriented to person, place, and time and well-developed, well-nourished, and in no distress. No distress.  HENT:  Head: Normocephalic and atraumatic.  Eyes: Conjunctivae are normal.  Neck: Neck supple. No thyromegaly present.  Cardiovascular: Normal rate, regular rhythm and normal heart sounds.   No murmur heard. Pulmonary/Chest: Effort normal and breath sounds normal. No respiratory distress.  Abdominal: He exhibits no distension and no mass. There is no tenderness.  Musculoskeletal: He exhibits no edema.  Neurological: He is alert and oriented to person, place, and time.  Skin: Skin is warm.  Psychiatric: Memory, affect and judgment normal.    Lab Results  Component Value Date   TSH 1.66 12/02/2014   Lab Results  Component Value Date   WBC 6.9 12/02/2014   HGB 13.5 12/02/2014   HCT 39.2 12/02/2014   MCV 92.8 12/02/2014   PLT 155.0 12/02/2014   Lab Results  Component Value Date   CREATININE 1.41 12/02/2014   BUN 18 12/02/2014   NA 136 12/02/2014   K 3.8 12/02/2014   CL 102 12/02/2014   CO2 28 12/02/2014   Lab Results  Component Value Date   ALT 28 12/02/2014   AST 22 12/02/2014   ALKPHOS 62 12/02/2014   BILITOT 0.8 12/02/2014   Lab Results  Component Value Date    CHOL 146 12/02/2014   Lab Results  Component Value Date   HDL 42.80 12/02/2014   Lab Results  Component Value Date   LDLCALC 71 12/02/2014   Lab Results  Component Value Date   TRIG 162.0* 12/02/2014   Lab Results  Component Value Date   CHOLHDL 3 12/02/2014     Assessment & Plan  Essential hypertension Well  controlled, no changes to meds. Encouraged heart healthy diet such as the DASH diet and exercise as tolerated.   Renal insufficiency Requests new referral to his long term nephrologist, Dr Rogers Blocker at [facility]. placed  Tachycardia RRR today  Malignant lymphoma, small lymphocytic Referred back to his oncologist, Dr Earlie Server  Hyperglycemia  minimize simple carbs. Increase exercise as tolerated.   Hyperlipidemia, mixed Encouraged heart healthy diet, increase exercise, avoid trans fats, consider a krill oil cap daily

## 2015-01-13 NOTE — Progress Notes (Signed)
Pre visit review using our clinic review tool, if applicable. No additional management support is needed unless otherwise documented below in the visit note. 

## 2015-01-13 NOTE — Patient Instructions (Addendum)
Probiotics daily such as Digestive Advantage or [facility].  Increase fluids, add Mucinex twice daily x 7 days   Hypertension Hypertension, commonly called high blood pressure, is when the force of blood pumping through your arteries is too strong. Your arteries are the blood vessels that carry blood from your heart throughout your body. A blood pressure reading consists of a higher number over a lower number, such as 110/72. The higher number (systolic) is the pressure inside your arteries when your heart pumps. The lower number (diastolic) is the pressure inside your arteries when your heart relaxes. Ideally you want your blood pressure below 120/80. Hypertension forces your heart to work harder to pump blood. Your arteries may become narrow or stiff. Having hypertension puts you at risk for heart disease, stroke, and other problems.  RISK FACTORS Some risk factors for high blood pressure are controllable. Others are not.  Risk factors you cannot control include:   Race. You may be at higher risk if you are African American.  Age. Risk increases with age.  Gender. Men are at higher risk than women before age 65 years. After age 65, women are at higher risk than men. Risk factors you can control include:  Not getting enough exercise or physical activity.  Being overweight.  Getting too much fat, sugar, calories, or salt in your diet.  Drinking too much alcohol. SIGNS AND SYMPTOMS Hypertension does not usually cause signs or symptoms. Extremely high blood pressure (hypertensive crisis) may cause headache, anxiety, shortness of breath, and nosebleed. DIAGNOSIS  To check if you have hypertension, your health care provider will measure your blood pressure while you are seated, with your arm held at the level of your heart. It should be measured at least twice using the same arm. Certain conditions can cause a difference in blood pressure between your right and left arms. A blood  pressure reading that is higher than normal on one occasion does not mean that you need treatment. If one blood pressure reading is high, ask your health care provider about having it checked again. TREATMENT  Treating high blood pressure includes making lifestyle changes and possibly taking medicine. Living a healthy lifestyle can help lower high blood pressure. You may need to change some of your habits. Lifestyle changes may include:  Following the DASH diet. This diet is high in fruits, vegetables, and whole grains. It is low in salt, red meat, and added sugars.  Getting at least 2 hours of brisk physical activity every week.  Losing weight if necessary.  Not smoking.  Limiting alcoholic beverages.  Learning ways to reduce stress. If lifestyle changes are not enough to get your blood pressure under control, your health care provider may prescribe medicine. You may need to take more than one. Work closely with your health care provider to understand the risks and benefits. HOME CARE INSTRUCTIONS  Have your blood pressure rechecked as directed by your health care provider.   Take medicines only as directed by your health care provider. Follow the directions carefully. Blood pressure medicines must be taken as prescribed. The medicine does not work as well when you skip doses. Skipping doses also puts you at risk for problems.   Do not smoke.   Monitor your blood pressure at home as directed by your health care provider. SEEK MEDICAL CARE IF:   You think you are having a reaction to medicines taken.  You have recurrent headaches or feel dizzy.  You have swelling in  your ankles.  You have trouble with your vision. SEEK IMMEDIATE MEDICAL CARE IF:  You develop a severe headache or confusion.  You have unusual weakness, numbness, or feel faint.  You have severe chest or abdominal pain.  You vomit repeatedly.  You have trouble breathing. MAKE SURE YOU:   Understand  these instructions.  Will watch your condition.  Will get help right away if you are not doing well or get worse. Document Released: 07/16/2005 Document Revised: 11/30/2013 Document Reviewed: 05/08/2013 [facility] Patient Information 2015 [place], [place]. This information is not intended to replace advice given to you by your health care provider. Make sure you discuss any questions you have with your health care provider.

## 2015-01-16 ENCOUNTER — Encounter: Payer: Self-pay | Admitting: Family Medicine

## 2015-01-16 NOTE — Assessment & Plan Note (Signed)
Referred back to his oncologist, Dr Earlie Server

## 2015-01-16 NOTE — Assessment & Plan Note (Signed)
RRR today 

## 2015-01-16 NOTE — Assessment & Plan Note (Signed)
minimize simple carbs. Increase exercise as tolerated.  

## 2015-01-16 NOTE — Assessment & Plan Note (Signed)
Encouraged heart healthy diet, increase exercise, avoid trans fats, consider a krill oil cap daily 

## 2015-01-16 NOTE — Assessment & Plan Note (Signed)
Requests new referral to his long term nephrologist, Dr Rogers Blocker at [facility]. placed

## 2015-01-16 NOTE — Assessment & Plan Note (Signed)
Well controlled, no changes to meds. Encouraged heart healthy diet such as the DASH diet and exercise as tolerated.  °

## 2015-01-25 ENCOUNTER — Ambulatory Visit (INDEPENDENT_AMBULATORY_CARE_PROVIDER_SITE_OTHER): Payer: Commercial Managed Care - HMO | Admitting: Internal Medicine

## 2015-01-25 ENCOUNTER — Encounter: Payer: Self-pay | Admitting: Internal Medicine

## 2015-01-25 VITALS — BP 148/72 | HR 71 | Ht 71.0 in | Wt 220.0 lb

## 2015-01-25 DIAGNOSIS — R0600 Dyspnea, unspecified: Secondary | ICD-10-CM | POA: Diagnosis not present

## 2015-01-25 DIAGNOSIS — E669 Obesity, unspecified: Secondary | ICD-10-CM | POA: Diagnosis not present

## 2015-01-25 LAB — PULMONARY FUNCTION TEST
DL/VA % pred: 103 %
DL/VA: 4.8 ml/min/mmHg/L
DLCO UNC % PRED: 83 %
DLCO unc: 28.06 ml/min/mmHg
FEF 25-75 PRE: 1.81 L/s
FEF 25-75 Post: 2.23 L/sec
FEF2575-%CHANGE-POST: 23 %
FEF2575-%PRED-POST: 88 %
FEF2575-%Pred-Pre: 71 %
FEV1-%CHANGE-POST: 6 %
FEV1-%PRED-POST: 86 %
FEV1-%PRED-PRE: 81 %
FEV1-PRE: 2.73 L
FEV1-Post: 2.91 L
FEV1FVC-%CHANGE-POST: 2 %
FEV1FVC-%PRED-PRE: 95 %
FEV6-%Change-Post: 4 %
FEV6-%Pred-Post: 94 %
FEV6-%Pred-Pre: 90 %
FEV6-Post: 4.07 L
FEV6-Pre: 3.9 L
FEV6FVC-%Change-Post: 0 %
FEV6FVC-%PRED-PRE: 105 %
FEV6FVC-%Pred-Post: 106 %
FVC-%Change-Post: 4 %
FVC-%Pred-Post: 89 %
FVC-%Pred-Pre: 85 %
FVC-POST: 4.09 L
FVC-Pre: 3.93 L
POST FEV1/FVC RATIO: 71 %
POST FEV6/FVC RATIO: 100 %
PRE FEV6/FVC RATIO: 99 %
Pre FEV1/FVC ratio: 70 %
RV % pred: 76 %
RV: 1.93 L
TLC % PRED: 81 %
TLC: 5.91 L

## 2015-01-25 NOTE — Assessment & Plan Note (Signed)
-   12/13/2014  Walked RA x 3 laps @ 185 ft each stopped due to  End of study, nl pace, no desat or sob  - PFT's 01/25/2015   FEV1 2.91 (86%) ratio 71 and no change p saba and DLCO 83% with ERV 63% only abnormality   I had an extended final summary discussion with the patient reviewing all relevant studies completed to date and  lasting 15 to 20 minutes of a 25 minute visit on the following issues:    1) despite smoking hx and prev lung surgery, the only abn is a low erv c/w body habitus  2) no resp rx needed/ no f/u needed but should work on wt loss (see obesity)

## 2015-01-25 NOTE — Progress Notes (Signed)
PFT done today. 

## 2015-01-25 NOTE — Progress Notes (Signed)
 Subjective:    Patient ID: Kyle Long, male    DOB: 11/15/1945     MRN: [id number]   Brief patient profile:  68 yowm quit smoking 1981 with no symptoms despite LLLobectomy / chemo 2013 and still no resp complaints until winter of 2016(? Late jan)  with acute onset of ?viral Uri then chronic cough /sob so referred to pulmonary clinic 12/13/2014 by Stacey Blyth.  Of note pt is under the care of Dr Mohammed with last note dated 11/09/14   DIAGNOSIS AND STAGE:  1) small lymphocytic lymphoma involving the left supraclavicular lymph nodes diagnosed in February 2016. 2) Stage IIA (T1a, N1, M0) non-small cell lung cancer consistent with adenocarcinoma with negative EGFR mutation diagnosed in March of 2013.  rec no further rx   PRIOR THERAPY:  1) S/P left video-assisted thoracoscopy, wedge resection of left lower lobe nodule, thoracoscopic left lower lobectomy, mediastinal lymph node dissection on 11/02/2011.  2) Adjuvant chemotherapy with cisplatin 75 mg/M2 and Alimta 500 mg/M2 every 3 weeks. He is status post 4 cycles.  rec f/u q 6 m s rx    12/13/2014 1st [facility] office visit/ [facility]   Chief Complaint  Patient presents with  . Pulmonary Consult    Referred by Edward Saguire. Pt c/o SOB and cough for the past 2 months- had had 2 rounds of zithromax and symptoms have improved.   cough is gone, still feels the urge to clear throat though, mostly daytime, not at all disturbing sleep.  Stopped both symbicort and singulair about 3 days prior to OV s flare so far- doesn't think saba helped and not sure about symb or singulair but worried about effect on hbp  Was doing treadmill   2 miles in 30 min s  incline prior to onset of cough in Jan 2016 but afraid to start back and says sob just carrying groceries in from car now, ever since acute illness in Jan 2016 and though the cough is gone the doe is not.  No resting or not symptoms rec Resume treadmill three times a week a work up 30 min   Prevnar 13 now and flu vaccination each fall.       01/25/2015 f/u ov/[facility] re: doe improving s resp rx  Chief Complaint  Patient presents with  . Follow-up    PFT done today. Pt states his breathing is doing well. He denies any new co's today.    Back on treadmill x 35 m x 2 m which is just slt off his previous pace   No  assoc cough cp or chest tightness, subjective wheeze overt sinus or hb symptoms. No unusual exp hx or h/o childhood pna/ asthma or knowledge of premature birth.  Sleeping ok without nocturnal  or early am exacerbation  of respiratory  c/o's or need for noct saba. Also denies any obvious fluctuation of symptoms with weather or environmental changes or other aggravating or alleviating factors except as outlined above   Current Medications, Allergies, Complete Past Medical History, Past Surgical History, Family History, and Social History were reviewed in [facility] electronic medical record.  ROS  The following are not active complaints unless bolded sore throat, dysphagia, dental problems, itching, sneezing,  nasal congestion or excess/ purulent secretions, ear ache,   fever, chills, sweats, unintended wt loss, pleuritic or exertional cp, hemoptysis,  orthopnea pnd or leg swelling, presyncope, palpitations, heartburn, abdominal pain, anorexia, nausea, vomiting, diarrhea  or change in bowel or urinary habits,   change in stools or urine, dysuria,hematuria,  rash, arthralgias, visual complaints, headache, numbness weakness or ataxia or problems with walking or coordination,  change in mood/affect or memory.              Objective:   Physical Exam  amb pleasant wm nad  01/25/2015        220  Wt Readings from Last 3 Encounters:  12/13/14 216 lb (97.977 kg)  11/23/14 211 lb 12.8 oz (96.072 kg)  11/11/14 216 lb 9.6 oz (98.249 kg)    Vital signs reviewed   HEENT: nl dentition, turbinates, and orophanx. Nl external ear canals without cough reflex   NECK :   without JVD/Nodes/TM/ nl carotid upstrokes bilaterally   LUNGS: no acc muscle use, decreased bs in L base, otherwise clear bilaterally     CV:  RRR  no s3 or murmur or increase in P2, no edema   ABD:  soft and nontender with nl excursion in the supine position. No bruits or organomegaly, bowel sounds nl  MS:  warm without deformities, calf tenderness, cyanosis or clubbing  SKIN: warm and dry without lesions    NEURO:  alert, approp, no deficits   I personally reviewed images and agree with radiology impression as follows:  CXR:  12/02/14  1. Stable left base pleural parenchymal thickening consistent scarring. Stable cardiomegaly. 2. No acute cardiopulmonary disease.           Assessment & Plan:   

## 2015-01-25 NOTE — Assessment & Plan Note (Signed)
-   PFT's 01/25/2015    ERV 63% only abnormality    Body mass index is 30.7 kg/(m^2).  Lab Results  Component Value Date   TSH 1.66 12/02/2014     Contributing to gerd tendency/ doe/ needs to achieve and maintain neg calorie balance >f/u primary care

## 2015-01-25 NOTE — Patient Instructions (Signed)
No pulmonary follow up needed

## 2015-05-10 ENCOUNTER — Encounter: Payer: Self-pay | Admitting: Internal Medicine

## 2015-05-10 ENCOUNTER — Other Ambulatory Visit (HOSPITAL_BASED_OUTPATIENT_CLINIC_OR_DEPARTMENT_OTHER): Payer: Commercial Managed Care - HMO

## 2015-05-10 ENCOUNTER — Telehealth: Payer: Self-pay | Admitting: Internal Medicine

## 2015-05-10 ENCOUNTER — Ambulatory Visit (HOSPITAL_BASED_OUTPATIENT_CLINIC_OR_DEPARTMENT_OTHER): Payer: Commercial Managed Care - HMO | Admitting: Internal Medicine

## 2015-05-10 VITALS — BP 182/80 | HR 62 | Temp 97.6°F | Resp 18 | Ht 71.0 in | Wt 220.8 lb

## 2015-05-10 DIAGNOSIS — C8300 Small cell B-cell lymphoma, unspecified site: Secondary | ICD-10-CM

## 2015-05-10 DIAGNOSIS — C8308 Small cell B-cell lymphoma, lymph nodes of multiple sites: Secondary | ICD-10-CM | POA: Diagnosis not present

## 2015-05-10 DIAGNOSIS — C3432 Malignant neoplasm of lower lobe, left bronchus or lung: Secondary | ICD-10-CM

## 2015-05-10 LAB — CBC WITH DIFFERENTIAL/PLATELET
BASO%: 0.3 % (ref 0.0–2.0)
Basophils Absolute: 0 10*3/uL (ref 0.0–0.1)
EOS%: 1.6 % (ref 0.0–7.0)
Eosinophils Absolute: 0.1 10*3/uL (ref 0.0–0.5)
HCT: 42.7 % (ref 38.4–49.9)
HEMOGLOBIN: 14.5 g/dL (ref 13.0–17.1)
LYMPH%: 29.8 % (ref 14.0–49.0)
MCH: 33.1 pg (ref 27.2–33.4)
MCHC: 34 g/dL (ref 32.0–36.0)
MCV: 97.5 fL (ref 79.3–98.0)
MONO#: 0.8 10*3/uL (ref 0.1–0.9)
MONO%: 10.3 % (ref 0.0–14.0)
NEUT#: 4.4 10*3/uL (ref 1.5–6.5)
NEUT%: 58 % (ref 39.0–75.0)
Platelets: 125 10*3/uL — ABNORMAL LOW (ref 140–400)
RBC: 4.38 10*6/uL (ref 4.20–5.82)
RDW: 12.9 % (ref 11.0–14.6)
WBC: 7.6 10*3/uL (ref 4.0–10.3)
lymph#: 2.3 10*3/uL (ref 0.9–3.3)

## 2015-05-10 LAB — COMPREHENSIVE METABOLIC PANEL (CC13)
ALBUMIN: 4.1 g/dL (ref 3.5–5.0)
ALK PHOS: 63 U/L (ref 40–150)
ALT: 37 U/L (ref 0–55)
AST: 28 U/L (ref 5–34)
Anion Gap: 7 mEq/L (ref 3–11)
BUN: 19.3 mg/dL (ref 7.0–26.0)
CALCIUM: 9.7 mg/dL (ref 8.4–10.4)
CO2: 29 mEq/L (ref 22–29)
Chloride: 102 mEq/L (ref 98–109)
Creatinine: 1.5 mg/dL — ABNORMAL HIGH (ref 0.7–1.3)
EGFR: 47 mL/min/{1.73_m2} — ABNORMAL LOW (ref >90)
Glucose: 136 mg/dl (ref 70–140)
Potassium: 4.4 mEq/L (ref 3.5–5.1)
Sodium: 138 mEq/L (ref 136–145)
Total Bilirubin: 1.16 mg/dL (ref 0.20–1.20)
Total Protein: 7.6 g/dL (ref 6.4–8.3)

## 2015-05-10 LAB — LACTATE DEHYDROGENASE (CC13): LDH: 172 U/L (ref 125–245)

## 2015-05-10 NOTE — Telephone Encounter (Signed)
Gave patient avs report and appointments for April 2017. [facility] will call with ct - patient aware. Message below sent to MM per patient request. Patient has not been given contrast.    Hi Dr. Julien Nordmann,  The CT order entered is for Chest/Abd/Pelvis. Per Kyle Long he usually only has a CT-Chest and has never drank contrast before. Please clarify. If he does need the CAP can the desk nurse please call and explain.

## 2015-05-10 NOTE — Telephone Encounter (Signed)
Gave patient avs report and appointments for April 2017. [facility] will call with ct - patient aware.

## 2015-05-10 NOTE — Progress Notes (Signed)
[facility] [phone]   [phone]  OFFICE PROGRESS NOTE  [name], MD [address] [place] [address]  DIAGNOSIS AND STAGE:  1) small lymphocytic lymphoma involving the left supraclavicular lymph nodes diagnosed in February 2016. 2) Stage IIA (T1a, N1, M0) non-small cell lung cancer consistent with adenocarcinoma with negative EGFR mutation diagnosed in March of 2013.   PRIOR THERAPY:  1) S/P left video-assisted thoracoscopy, wedge resection of left lower lobe nodule, thoracoscopic left lower lobectomy, mediastinal lymph node dissection on 11/02/2011.  2) Adjuvant chemotherapy with cisplatin 75 mg/M2 and Alimta 500 mg/M2 every 3 weeks. She is status post 4 cycles.   CURRENT THERAPY: Observation.  CHEMOTHERAPY INTENT: Curative/adjuvant  CURRENT # OF CHEMOTHERAPY CYCLES: 0  CURRENT ANTIEMETICS: Compazine  CURRENT SMOKING STATUS: Currently nonsmoker  ORAL CHEMOTHERAPY AND CONSENT: None  CURRENT BISPHOSPHONATES USE: None  PAIN MANAGEMENT: 0/10  NARCOTICS INDUCED CONSTIPATION: N/A  LIVING WILL AND CODE STATUS: Full code initially. INTERVAL HISTORY: JYE FARISS 69 y.o. male returns to the clinic today for follow-up visit accompanied by his wife. The patient has been observation for the last few years with no concerning complaints except for chest congestion and mild cough productive of whitish sputum. He is currently taking Mucinex and Claritin with some improvement. He denied having any significant fever or chills. He has no significant weight loss or night sweats. The patient denied having any significant chest pain, shortness of breath or hemoptysis. He has repeat bloodwork performed earlier today and he is here for evaluation and discussion of his lab results.   MEDICAL HISTORY: Past Medical History  Diagnosis Date  . Hypertension   . Peyronie disease   . Gilbert's syndrome   . Hyperlipidemia   .  Amnesia 2009    isolated;for 30 mins w/elevated BP  . Hematuria 2009    Dr Burnell Blanks  . Hyperglycemia   . Diverticulosis of colon   . Cellulitis 2011    LUE (no PMH of MRSA)/left elbow  . Peptic ulcer 1995  . Recurrent upper respiratory infection (URI)     09/2011  BRONCHITIS  . GERD (gastroesophageal reflux disease)     OCC HEARTBURN  . Neuromuscular disorder ([facility])     2010 TEMPORARY MEMORY LOSS 1-1.5 HR WAS CHECKED OUT AT [facility] , NOTHING FOUND  . Arthritis   . Cancer ([facility])     skin / top head  . Lung cancer, lower lobe ([facility])     Stage IIA (T1,N1)  . Diverticulosis     ALLERGIES:  is allergic to lisinopril.  MEDICATIONS:  Current Outpatient Prescriptions  Medication Sig Dispense Refill  . diltiazem (CARDIZEM CD) 240 MG 24 hr capsule Take 1 capsule (240 mg total) by mouth daily. 90 capsule 3  . FLONASE ALLERGY RELIEF 50 MCG/ACT nasal spray     . losartan (COZAAR) 100 MG tablet Take 0.5 tablets (50 mg total) by mouth daily. 45 tablet 3  . metoprolol tartrate (LOPRESSOR) 25 MG tablet Take 1 tablet (25 mg total) by mouth 2 (two) times daily. 180 tablet 3   No current facility-administered medications for this visit.    SURGICAL HISTORY:  Past Surgical History  Procedure Laterality Date  . Colonoscopy w/ polypectomy  2005    due 2015  . Vasectomy    . Cystoscopy  2009  . Vein surgery      R ankle  . Lobectomy  11/02/2011  Procedure: LOBECTOMY;  Surgeon: Melrose Nakayama, MD;  Location: [facility];  Service: Thoracic;  Laterality: Left;  LEFT LOWER LOBECTOMY  . Refractive surgery      Bil  . Chemo treatment       4 treatments that last 7 hours each/last treatmebt in 2013  . Skin biopsy      4 0r 5  . Supraclavical node biopsy Left 10/15/2014    Procedure: LEFT SUPRACLAVICAL LYMPH NODE BIOPSY;  Surgeon: Melrose Nakayama, MD;  Location: [facility];  Service: Thoracic;  Laterality: Left;  LEFT SUPRACLAVICAL LYMPH NODE BIOPSY    REVIEW OF SYSTEMS:  Constitutional:  negative Eyes: negative Ears, nose, mouth, throat, and face: negative Respiratory: positive for cough and sputum Cardiovascular: negative Gastrointestinal: negative Genitourinary:negative Integument/breast: negative Hematologic/lymphatic: negative Musculoskeletal:negative Neurological: negative Behavioral/Psych: negative Endocrine: negative Allergic/Immunologic: negative   PHYSICAL EXAMINATION: General appearance: alert, cooperative and no distress Head: Normocephalic, without obvious abnormality, atraumatic Neck: no adenopathy, no JVD, supple, symmetrical, trachea midline and thyroid not enlarged, symmetric, no tenderness/mass/nodules Lymph nodes: Cervical, supraclavicular, and axillary nodes normal. Resp: clear to auscultation bilaterally Back: symmetric, no curvature. ROM normal. No CVA tenderness. Cardio: regular rate and rhythm, S1, S2 normal, no murmur, click, rub or gallop GI: soft, non-tender; bowel sounds normal; no masses,  no organomegaly Extremities: extremities normal, atraumatic, no cyanosis or edema Neurologic: Alert and oriented X 3, normal strength and tone. Normal symmetric reflexes. Normal coordination and gait  ECOG PERFORMANCE STATUS: 1 - Symptomatic but completely ambulatory  Blood pressure 182/80, pulse 62, temperature 97.6 F (36.4 C), temperature source Oral, resp. rate 18, height 5' 11"  (1.803 m), weight 220 lb 12.8 oz (100.154 kg), SpO2 100 %.  LABORATORY DATA: Lab Results  Component Value Date   WBC 7.6 05/10/2015   HGB 14.5 05/10/2015   HCT 42.7 05/10/2015   MCV 97.5 05/10/2015   PLT 125* 05/10/2015      Chemistry      Component Value Date/Time   NA 138 05/10/2015 1038   NA 136 12/02/2014 1047   K 4.4 05/10/2015 1038   K 3.8 12/02/2014 1047   CL 102 12/02/2014 1047   CL 104 09/03/2012 1003   CO2 29 05/10/2015 1038   CO2 28 12/02/2014 1047   BUN 19.3 05/10/2015 1038   BUN 18 12/02/2014 1047   CREATININE 1.5* 05/10/2015 1038    CREATININE 1.41 12/02/2014 1047      Component Value Date/Time   CALCIUM 9.7 05/10/2015 1038   CALCIUM 9.9 12/02/2014 1047   ALKPHOS 63 05/10/2015 1038   ALKPHOS 62 12/02/2014 1047   AST 28 05/10/2015 1038   AST 22 12/02/2014 1047   ALT 37 05/10/2015 1038   ALT 28 12/02/2014 1047   BILITOT 1.16 05/10/2015 1038   BILITOT 0.8 12/02/2014 1047       RADIOGRAPHIC STUDIES: No results found.  ASSESSMENT AND PLAN: This is a very pleasant 69 years old white male with: 1) stage IIA non-small cell lung cancer, adenocarcinoma status post wedge resection of the left lower lobe with lymph node dissection followed by adjuvant chemotherapy with cisplatin and Alimta. He is currently on observation with no evidence for disease recurrence. 2) stage II small lymphocytic lymphoma with multiple small lymph nodes in the neck, chest as well as abdomen and pelvis. The patient is currently asymptomatic. He has no concerning complaints since the last visit. I recommended for him continuous observation for now with repeat CBC, comprehensive metabolic panel and LDH in addition  to repeat CT scan of the chest, abdomen and pelvis in 6 months. He was advised to call immediately if he has any concerning symptoms in the interval. The patient voices understanding of current disease status and treatment options and is in agreement with the current care plan.  All questions were answered. The patient knows to call the clinic with any problems, questions or concerns. We can certainly see the patient much sooner if necessary.  Disclaimer: This note was dictated with voice recognition software. Similar sounding words can inadvertently be transcribed and may not be corrected upon review.

## 2015-05-13 DIAGNOSIS — C3490 Malignant neoplasm of unspecified part of unspecified bronchus or lung: Secondary | ICD-10-CM | POA: Diagnosis not present

## 2015-05-13 DIAGNOSIS — N183 Chronic kidney disease, stage 3 (moderate): Secondary | ICD-10-CM | POA: Diagnosis not present

## 2015-05-13 DIAGNOSIS — I129 Hypertensive chronic kidney disease with stage 1 through stage 4 chronic kidney disease, or unspecified chronic kidney disease: Secondary | ICD-10-CM | POA: Diagnosis not present

## 2015-05-13 DIAGNOSIS — Z79899 Other long term (current) drug therapy: Secondary | ICD-10-CM | POA: Diagnosis not present

## 2015-05-13 DIAGNOSIS — D649 Anemia, unspecified: Secondary | ICD-10-CM | POA: Diagnosis not present

## 2015-05-13 DIAGNOSIS — N400 Enlarged prostate without lower urinary tract symptoms: Secondary | ICD-10-CM | POA: Diagnosis not present

## 2015-06-17 ENCOUNTER — Ambulatory Visit (INDEPENDENT_AMBULATORY_CARE_PROVIDER_SITE_OTHER): Payer: Commercial Managed Care - HMO | Admitting: Family Medicine

## 2015-06-17 ENCOUNTER — Encounter: Payer: Self-pay | Admitting: Family Medicine

## 2015-06-17 VITALS — BP 132/82 | HR 73 | Temp 98.1°F | Ht 70.5 in | Wt 223.5 lb

## 2015-06-17 DIAGNOSIS — R000 Tachycardia, unspecified: Secondary | ICD-10-CM | POA: Diagnosis not present

## 2015-06-17 DIAGNOSIS — R739 Hyperglycemia, unspecified: Secondary | ICD-10-CM | POA: Diagnosis not present

## 2015-06-17 DIAGNOSIS — T7840XA Allergy, unspecified, initial encounter: Secondary | ICD-10-CM

## 2015-06-17 DIAGNOSIS — Z23 Encounter for immunization: Secondary | ICD-10-CM

## 2015-06-17 DIAGNOSIS — I10 Essential (primary) hypertension: Secondary | ICD-10-CM

## 2015-06-17 DIAGNOSIS — E669 Obesity, unspecified: Secondary | ICD-10-CM

## 2015-06-17 DIAGNOSIS — E782 Mixed hyperlipidemia: Secondary | ICD-10-CM | POA: Diagnosis not present

## 2015-06-17 LAB — LIPID PANEL
CHOLESTEROL: 196 mg/dL (ref 0–200)
HDL: 58.4 mg/dL (ref >39.00)
LDL Cholesterol: 101 mg/dL — ABNORMAL HIGH (ref 0–99)
NonHDL: 137.15
TRIGLYCERIDES: 179 mg/dL — AB (ref 0.0–149.0)
Total CHOL/HDL Ratio: 3
VLDL: 35.8 mg/dL (ref 0.0–40.0)

## 2015-06-17 LAB — TSH: TSH: 1.71 u[IU]/mL (ref 0.35–4.50)

## 2015-06-17 LAB — CBC
HCT: 43.4 % (ref 39.0–52.0)
Hemoglobin: 14.8 g/dL (ref 13.0–17.0)
MCHC: 34 g/dL (ref 30.0–36.0)
MCV: 97.2 fl (ref 78.0–100.0)
Platelets: 187 10*3/uL (ref 150.0–400.0)
RBC: 4.46 Mil/uL (ref 4.22–5.81)
RDW: 12.8 % (ref 11.5–15.5)
WBC: 9 10*3/uL (ref 4.0–10.5)

## 2015-06-17 LAB — HEMOGLOBIN A1C: Hgb A1c MFr Bld: 5.3 % (ref 4.6–6.5)

## 2015-06-17 NOTE — Progress Notes (Signed)
Pre visit review using our clinic review tool, if applicable. No additional management support is needed unless otherwise documented below in the visit note. 

## 2015-06-17 NOTE — Patient Instructions (Signed)
Encouraged increased rest and hydration, add probiotics, zinc such as Coldeze or Xicam. Treat fevers as needed. Consier Vitamin C 500 to 1000 mg daily and elderberry capsules Call if symptoms get worse so we treat  Upper Respiratory Infection, Adult Most upper respiratory infections (URIs) are a viral infection of the air passages leading to the lungs. A URI affects the nose, throat, and upper air passages. The most common type of URI is nasopharyngitis and is typically referred to as "the common cold." URIs run their course and usually go away on their own. Most of the time, a URI does not require medical attention, but sometimes a bacterial infection in the upper airways can follow a viral infection. This is called a secondary infection. Sinus and middle ear infections are common types of secondary upper respiratory infections. Bacterial pneumonia can also complicate a URI. A URI can worsen asthma and chronic obstructive pulmonary disease (COPD). Sometimes, these complications can require emergency medical care and may be life threatening.  CAUSES Almost all URIs are caused by viruses. A virus is a type of germ and can spread from one person to another.  RISKS FACTORS You may be at risk for a URI if:   You smoke.   You have chronic heart or lung disease.  You have a weakened defense (immune) system.   You are very young or very old.   You have nasal allergies or asthma.  You work in crowded or poorly ventilated areas.  You work in health care facilities or schools. SIGNS AND SYMPTOMS  Symptoms typically develop 2-3 days after you come in contact with a cold virus. Most viral URIs last 7-10 days. However, viral URIs from the influenza virus (flu virus) can last 14-18 days and are typically more severe. Symptoms may include:   Runny or stuffy (congested) nose.   Sneezing.   Cough.   Sore throat.   Headache.   Fatigue.   Fever.   Loss of appetite.   Pain in your  forehead, behind your eyes, and over your cheekbones (sinus pain).  Muscle aches.  DIAGNOSIS  Your health care provider may diagnose a URI by:  Physical exam.  Tests to check that your symptoms are not due to another condition such as:  Strep throat.  Sinusitis.  Pneumonia.  Asthma. TREATMENT  A URI goes away on its own with time. It cannot be cured with medicines, but medicines may be prescribed or recommended to relieve symptoms. Medicines may help:  Reduce your fever.  Reduce your cough.  Relieve nasal congestion. HOME CARE INSTRUCTIONS   Take medicines only as directed by your health care provider.   Gargle warm saltwater or take cough drops to comfort your throat as directed by your health care provider.  Use a warm mist humidifier or inhale steam from a shower to increase air moisture. This may make it easier to breathe.  Drink enough fluid to keep your urine clear or pale yellow.   Eat soups and other clear broths and maintain good nutrition.   Rest as needed.   Return to work when your temperature has returned to normal or as your health care provider advises. You may need to stay home longer to avoid infecting others. You can also use a face mask and careful hand washing to prevent spread of the virus.  Increase the usage of your inhaler if you have asthma.   Do not use any tobacco products, including cigarettes, chewing tobacco, or electronic cigarettes. If  you need help quitting, ask your health care provider. PREVENTION  The best way to protect yourself from getting a cold is to practice good hygiene.   Avoid oral or hand contact with people with cold symptoms.   Wash your hands often if contact occurs.  There is no clear evidence that vitamin C, vitamin E, echinacea, or exercise reduces the chance of developing a cold. However, it is always recommended to get plenty of rest, exercise, and practice good nutrition.  SEEK MEDICAL CARE IF:   You  are getting worse rather than better.   Your symptoms are not controlled by medicine.   You have chills.  You have worsening shortness of breath.  You have brown or red mucus.  You have yellow or brown nasal discharge.  You have pain in your face, especially when you bend forward.  You have a fever.  You have swollen neck glands.  You have pain while swallowing.  You have white areas in the back of your throat. SEEK IMMEDIATE MEDICAL CARE IF:   You have severe or persistent:  Headache.  Ear pain.  Sinus pain.  Chest pain.  You have chronic lung disease and any of the following:  Wheezing.  Prolonged cough.  Coughing up blood.  A change in your usual mucus.  You have a stiff neck.  You have changes in your:  Vision.  Hearing.  Thinking.  Mood. MAKE SURE YOU:   Understand these instructions.  Will watch your condition.  Will get help right away if you are not doing well or get worse.   This information is not intended to replace advice given to you by your health care provider. Make sure you discuss any questions you have with your health care provider.   Document Released: 01/09/2001 Document Revised: 11/30/2014 Document Reviewed: 10/21/2013 Elsevier Interactive Patient Education [employer].

## 2015-06-26 ENCOUNTER — Encounter: Payer: Self-pay | Admitting: Family Medicine

## 2015-06-26 DIAGNOSIS — T7840XA Allergy, unspecified, initial encounter: Secondary | ICD-10-CM

## 2015-06-26 HISTORY — DX: Allergy, unspecified, initial encounter: T78.40XA

## 2015-06-26 NOTE — Progress Notes (Signed)
Subjective:    Patient ID: Kyle Long, male    DOB: [birth year], 69 y.o.   MRN: [id number]  Chief Complaint  Patient presents with  . Follow-up    HPI Patient is in today for follow up. Continues to follow with oncology for his lymphoma and his lung cancer and needs a referral back to his oncologist, Dr Earlie Server for insurance purposes. Has struggled with increased head congestion for the past 2 weeks. No fevers, chills or signs of systemic illness. Claritin and Mucinex only slightly helpful. No other acute concerns. Denies CP/palp/SOB/HA/congestion/fevers/GI or GU c/o. Taking meds as prescribed  Past Medical History  Diagnosis Date  . Hypertension   . Peyronie disease   . Gilbert's syndrome   . Hyperlipidemia   . Amnesia 2009    isolated;for 30 mins w/elevated BP  . Hematuria 2009    Dr Burnell Blanks  . Hyperglycemia   . Diverticulosis of colon   . Cellulitis 2011    LUE (no PMH of MRSA)/left elbow  . Peptic ulcer 1995  . Recurrent upper respiratory infection (URI)     09/2011  BRONCHITIS  . GERD (gastroesophageal reflux disease)     OCC HEARTBURN  . Neuromuscular disorder ([place])     2010 TEMPORARY MEMORY LOSS 1-1.5 HR WAS CHECKED OUT AT [facility] , NOTHING FOUND  . Arthritis   . Cancer ([place])     skin / top head  . Lung cancer, lower lobe (HCC)     Stage IIA (T1,N1)  . Diverticulosis   . Allergic state 06/26/2015    Past Surgical History  Procedure Laterality Date  . Colonoscopy w/ polypectomy  2005    due 2015  . Vasectomy    . Cystoscopy  2009  . Vein surgery      R ankle  . Lobectomy  11/02/2011    Procedure: LOBECTOMY;  Surgeon: Melrose Nakayama, MD;  Location: [facility];  Service: Thoracic;  Laterality: Left;  LEFT LOWER LOBECTOMY  . Refractive surgery      Bil  . Chemo treatment       4 treatments that last 7 hours each/last treatmebt in 2013  . Skin biopsy      4 0r 5  . Supraclavical node biopsy Left 10/15/2014    Procedure: LEFT SUPRACLAVICAL  LYMPH NODE BIOPSY;  Surgeon: Melrose Nakayama, MD;  Location: [facility];  Service: Thoracic;  Laterality: Left;  LEFT SUPRACLAVICAL LYMPH NODE BIOPSY    Family History  Problem Relation Age of Onset  . Stroke Mother     in 52s  . Hypertension Mother   . Breast cancer Mother   . Neuropathy Mother   . Melanoma Father   . Heart attack Father 71    stents  . Hypertension Sister   . Thyroid disease Sister     ??  . Multiple myeloma Sister   . Cancer Sister   . Diabetes Paternal Aunt   . Stomach cancer Maternal Grandfather   . Lung cancer Maternal Grandfather   . Cancer Paternal Grandmother     lung, nonsmoker  . Headache Daughter     Social History   Social History  . Marital Status: Married    Spouse Name: N/A  . Number of Children: N/A  . Years of Education: N/A   Occupational History  . retired    Social History Main Topics  . Smoking status: Former Smoker -- 1.00 packs/day for 15 years    Quit date:  07/31/1979  . Smokeless tobacco: Never Used  . Alcohol Use: 6.6 oz/week    2 Glasses of wine, 9 Shots of liquor per week     Comment: Daily  . Drug Use: No  . 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   Other Topics Concern  . Not on file   Social History Narrative   Reg exercise    Outpatient Prescriptions Prior to Visit  Medication Sig Dispense Refill  . diltiazem (CARDIZEM CD) 240 MG 24 hr capsule Take 1 capsule (240 mg total) by mouth daily. 90 capsule 3  . losartan (COZAAR) 100 MG tablet Take 0.5 tablets (50 mg total) by mouth daily. 45 tablet 3  . metoprolol tartrate (LOPRESSOR) 25 MG tablet Take 1 tablet (25 mg total) by mouth 2 (two) times daily. 180 tablet 3  . FLONASE ALLERGY RELIEF 50 MCG/ACT nasal spray      No facility-administered medications prior to visit.    Allergies  Allergen Reactions  . Lisinopril     cough    Review of Systems    Constitutional: Positive for malaise/fatigue. Negative for fever.  HENT: Positive for congestion.   Eyes: Negative for discharge.  Respiratory: Negative for shortness of breath.   Cardiovascular: Negative for chest pain, palpitations and leg swelling.  Gastrointestinal: Negative for nausea and abdominal pain.  Genitourinary: Negative for dysuria.  Musculoskeletal: Negative for falls.  Skin: Negative for rash.  Neurological: Negative for loss of consciousness and headaches.  Endo/Heme/Allergies: Negative for environmental allergies.  Psychiatric/Behavioral: Negative for depression. The patient is not nervous/anxious.        Objective:    Physical Exam  Constitutional: He is oriented to person, place, and time. He appears well-developed and well-nourished. No distress.  HENT:  Head: Normocephalic and atraumatic.  Nose: Nose normal.  Eyes: Right eye exhibits no discharge. Left eye exhibits no discharge.  Neck: Normal range of motion. Neck supple.  Cardiovascular: Normal rate and regular rhythm.   Murmur heard. Pulmonary/Chest: Effort normal and breath sounds normal.  Abdominal: Soft. Bowel sounds are normal. There is no tenderness.  Musculoskeletal: He exhibits no edema.  Neurological: He is alert and oriented to person, place, and time.  Skin: Skin is warm and dry.  Psychiatric: He has a normal mood and affect.  Nursing note and vitals reviewed.   BP 132/82 mmHg  Pulse 73  Temp(Src) 98.1 F (36.7 C) (Oral)  Ht 5' 10.5" (1.791 m)  Wt 223 lb 8 oz (101.379 kg)  BMI 31.61 kg/m2  SpO2 97% Wt Readings from Last 3 Encounters:  06/17/15 223 lb 8 oz (101.379 kg)  05/10/15 220 lb 12.8 oz (100.154 kg)  01/25/15 220 lb (99.791 kg)     Lab Results  Component Value Date   WBC 9.0 06/17/2015   HGB 14.8 06/17/2015   HCT 43.4 06/17/2015   PLT 187.0 06/17/2015   GLUCOSE 136 05/10/2015   CHOL 196 06/17/2015   TRIG 179.0* 06/17/2015   HDL 58.40 06/17/2015   LDLDIRECT 136.5  06/07/2011   LDLCALC 101* 06/17/2015   ALT 37 05/10/2015   AST 28 05/10/2015   NA 138 05/10/2015   K 4.4 05/10/2015   CL 102 12/02/2014   CREATININE 1.5* 05/10/2015   BUN 19.3 05/10/2015   CO2 29 05/10/2015   TSH 1.71 06/17/2015   PSA 1.86 09/04/2012   INR 1.03 10/12/2014   HGBA1C 5.3  06/17/2015    Lab Results  Component Value Date   TSH 1.71 06/17/2015   Lab Results  Component Value Date   WBC 9.0 06/17/2015   HGB 14.8 06/17/2015   HCT 43.4 06/17/2015   MCV 97.2 06/17/2015   PLT 187.0 06/17/2015   Lab Results  Component Value Date   NA 138 05/10/2015   K 4.4 05/10/2015   CHLORIDE 102 05/10/2015   CO2 29 05/10/2015   GLUCOSE 136 05/10/2015   BUN 19.3 05/10/2015   CREATININE 1.5* 05/10/2015   BILITOT 1.16 05/10/2015   ALKPHOS 63 05/10/2015   AST 28 05/10/2015   ALT 37 05/10/2015   PROT 7.6 05/10/2015   ALBUMIN 4.1 05/10/2015   CALCIUM 9.7 05/10/2015   ANIONGAP 7 05/10/2015   EGFR 47* 05/10/2015   GFR 52.72* 12/02/2014   Lab Results  Component Value Date   CHOL 196 06/17/2015   Lab Results  Component Value Date   HDL 58.40 06/17/2015   Lab Results  Component Value Date   LDLCALC 101* 06/17/2015   Lab Results  Component Value Date   TRIG 179.0* 06/17/2015   Lab Results  Component Value Date   CHOLHDL 3 06/17/2015   Lab Results  Component Value Date   HGBA1C 5.3 06/17/2015       Assessment & Plan:   Problem List Items Addressed This Visit    Allergic state    Encouraged claritin, flonase and nasal saline daily. Report if no improvement      Essential hypertension    Well controlled, no changes to meds. Encouraged heart healthy diet such as the DASH diet and exercise as tolerated.       Relevant Orders   CBC (Completed)   TSH (Completed)   Lipid panel (Completed)   Hemoglobin A1c (Completed)   TSH   CBC   Hemoglobin A1c   Lipid panel   Hyperglycemia     minimize simple carbs. Increase exercise as tolerated. Continue current  meds      Relevant Orders   CBC (Completed)   TSH (Completed)   Lipid panel (Completed)   Hemoglobin A1c (Completed)   TSH   CBC   Hemoglobin A1c   Lipid panel   Hyperlipidemia, mixed - Primary    Encouraged heart healthy diet, increase exercise, avoid trans fats, consider a krill oil cap daily      Relevant Orders   CBC (Completed)   TSH (Completed)   Lipid panel (Completed)   Hemoglobin A1c (Completed)   TSH   CBC   Hemoglobin A1c   Lipid panel   Obesity    Encouraged DASH diet, decrease po intake and increase exercise as tolerated. Needs 7-8 hours of sleep nightly. Avoid trans fats, eat small, frequent meals every 4-5 hours with lean proteins, complex carbs and healthy fats. Minimize simple carbs      Tachycardia    RRR today         I have discontinued Mr. Nakhi Choi ALLERGY RELIEF. I am also having him maintain his losartan, diltiazem, and metoprolol tartrate.  No orders of the defined types were placed in this encounter.     [name], MD

## 2015-06-26 NOTE — Assessment & Plan Note (Signed)
RRR today 

## 2015-06-26 NOTE — Assessment & Plan Note (Signed)
Well controlled, no changes to meds. Encouraged heart healthy diet such as the DASH diet and exercise as tolerated.  °

## 2015-06-26 NOTE — Assessment & Plan Note (Signed)
minimize simple carbs. Increase exercise as tolerated. Continue current meds  

## 2015-06-26 NOTE — Assessment & Plan Note (Signed)
Encouraged heart healthy diet, increase exercise, avoid trans fats, consider a krill oil cap daily 

## 2015-06-26 NOTE — Assessment & Plan Note (Signed)
Encouraged DASH diet, decrease po intake and increase exercise as tolerated. Needs 7-8 hours of sleep nightly. Avoid trans fats, eat small, frequent meals every 4-5 hours with lean proteins, complex carbs and healthy fats. Minimize simple carbs 

## 2015-06-26 NOTE — Assessment & Plan Note (Signed)
Encouraged claritin, flonase and nasal saline daily. Report if no improvement

## 2015-07-29 DIAGNOSIS — H5210 Myopia, unspecified eye: Secondary | ICD-10-CM | POA: Diagnosis not present

## 2015-07-29 DIAGNOSIS — H5202 Hypermetropia, left eye: Secondary | ICD-10-CM | POA: Diagnosis not present

## 2015-10-27 ENCOUNTER — Other Ambulatory Visit: Payer: Self-pay | Admitting: Internal Medicine

## 2015-10-27 ENCOUNTER — Other Ambulatory Visit: Payer: Self-pay | Admitting: Medical Oncology

## 2015-10-27 ENCOUNTER — Telehealth: Payer: Self-pay | Admitting: Medical Oncology

## 2015-10-27 DIAGNOSIS — C8300 Small cell B-cell lymphoma, unspecified site: Secondary | ICD-10-CM

## 2015-10-27 NOTE — Telephone Encounter (Signed)
Requests to change CT order to w/o contrast because he has CKD. Note to [place].

## 2015-10-31 ENCOUNTER — Telehealth: Payer: Self-pay | Admitting: *Deleted

## 2015-10-31 NOTE — Telephone Encounter (Signed)
"  Scheduling called me to schedule scans asking if I have allergies to injections.  In the four years I've been with Dr. Buddy Duty never had injections with the semi-annual scans.  I have CKD and was told never to "     Orders read no contrast,  Called Central scheduling to confirm we see same orders.  Verified with CT oral contrast effects on kidney.  Shared findings answering all patient questions.  Instructed to come by our office to pick up oral contrast.  Call transferred to Central scheduling.

## 2015-11-07 ENCOUNTER — Other Ambulatory Visit: Payer: Self-pay | Admitting: Medical Oncology

## 2015-11-07 ENCOUNTER — Encounter (HOSPITAL_COMMUNITY): Payer: Self-pay

## 2015-11-07 ENCOUNTER — Other Ambulatory Visit (HOSPITAL_BASED_OUTPATIENT_CLINIC_OR_DEPARTMENT_OTHER): Payer: Commercial Managed Care - HMO

## 2015-11-07 ENCOUNTER — Ambulatory Visit (HOSPITAL_COMMUNITY)
Admission: RE | Admit: 2015-11-07 | Discharge: 2015-11-07 | Disposition: A | Discharge status: Home / Self Care | Payer: Commercial Managed Care - HMO | Source: Ambulatory Visit | Attending: Internal Medicine | Admitting: Internal Medicine

## 2015-11-07 DIAGNOSIS — Z902 Acquired absence of lung [part of]: Secondary | ICD-10-CM | POA: Diagnosis not present

## 2015-11-07 DIAGNOSIS — I2510 Atherosclerotic heart disease of native coronary artery without angina pectoris: Secondary | ICD-10-CM | POA: Insufficient documentation

## 2015-11-07 DIAGNOSIS — C8300 Small cell B-cell lymphoma, unspecified site: Secondary | ICD-10-CM | POA: Insufficient documentation

## 2015-11-07 DIAGNOSIS — R918 Other nonspecific abnormal finding of lung field: Secondary | ICD-10-CM | POA: Diagnosis not present

## 2015-11-07 DIAGNOSIS — C3432 Malignant neoplasm of lower lobe, left bronchus or lung: Secondary | ICD-10-CM

## 2015-11-07 DIAGNOSIS — K314 Gastric diverticulum: Secondary | ICD-10-CM | POA: Diagnosis not present

## 2015-11-07 DIAGNOSIS — C8308 Small cell B-cell lymphoma, lymph nodes of multiple sites: Secondary | ICD-10-CM | POA: Diagnosis not present

## 2015-11-07 DIAGNOSIS — N400 Enlarged prostate without lower urinary tract symptoms: Secondary | ICD-10-CM | POA: Diagnosis not present

## 2015-11-07 DIAGNOSIS — C3490 Malignant neoplasm of unspecified part of unspecified bronchus or lung: Secondary | ICD-10-CM | POA: Diagnosis not present

## 2015-11-07 DIAGNOSIS — R590 Localized enlarged lymph nodes: Secondary | ICD-10-CM | POA: Insufficient documentation

## 2015-11-07 DIAGNOSIS — K429 Umbilical hernia without obstruction or gangrene: Secondary | ICD-10-CM | POA: Insufficient documentation

## 2015-11-07 LAB — CBC WITH DIFFERENTIAL/PLATELET
BASO%: 0.5 % (ref 0.0–2.0)
Basophils Absolute: 0 10*3/uL (ref 0.0–0.1)
EOS%: 2.2 % (ref 0.0–7.0)
Eosinophils Absolute: 0.2 10*3/uL (ref 0.0–0.5)
HEMATOCRIT: 43.4 % (ref 38.4–49.9)
HEMOGLOBIN: 14.6 g/dL (ref 13.0–17.1)
LYMPH#: 2.4 10*3/uL (ref 0.9–3.3)
LYMPH%: 32.6 % (ref 14.0–49.0)
MCH: 32.6 pg (ref 27.2–33.4)
MCHC: 33.7 g/dL (ref 32.0–36.0)
MCV: 96.9 fL (ref 79.3–98.0)
MONO#: 0.7 10*3/uL (ref 0.1–0.9)
MONO%: 9.3 % (ref 0.0–14.0)
NEUT#: 4 10*3/uL (ref 1.5–6.5)
NEUT%: 55.4 % (ref 39.0–75.0)
Platelets: 169 10*3/uL (ref 140–400)
RBC: 4.49 10*6/uL (ref 4.20–5.82)
RDW: 13.4 % (ref 11.0–14.6)
WBC: 7.3 10*3/uL (ref 4.0–10.3)

## 2015-11-07 LAB — COMPREHENSIVE METABOLIC PANEL
ALBUMIN: 4.3 g/dL (ref 3.5–5.0)
ALK PHOS: 55 U/L (ref 40–150)
ALT: 35 U/L (ref 0–55)
ANION GAP: 10 meq/L (ref 3–11)
AST: 31 U/L (ref 5–34)
BILIRUBIN TOTAL: 1.19 mg/dL (ref 0.20–1.20)
BUN: 20.2 mg/dL (ref 7.0–26.0)
CALCIUM: 10.1 mg/dL (ref 8.4–10.4)
CO2: 25 mEq/L (ref 22–29)
Chloride: 104 mEq/L (ref 98–109)
Creatinine: 1.3 mg/dL (ref 0.7–1.3)
EGFR: 53 mL/min/{1.73_m2} — AB (ref >90)
Glucose: 107 mg/dl (ref 70–140)
Potassium: 4.3 mEq/L (ref 3.5–5.1)
Sodium: 139 mEq/L (ref 136–145)
TOTAL PROTEIN: 8.2 g/dL (ref 6.4–8.3)

## 2015-11-07 LAB — LACTATE DEHYDROGENASE: LDH: 184 U/L (ref 125–245)

## 2015-11-10 ENCOUNTER — Ambulatory Visit (HOSPITAL_BASED_OUTPATIENT_CLINIC_OR_DEPARTMENT_OTHER): Payer: Commercial Managed Care - HMO | Admitting: Internal Medicine

## 2015-11-10 ENCOUNTER — Encounter: Payer: Self-pay | Admitting: Internal Medicine

## 2015-11-10 ENCOUNTER — Telehealth: Payer: Self-pay | Admitting: Internal Medicine

## 2015-11-10 VITALS — BP 162/79 | HR 72 | Temp 98.4°F | Resp 18 | Ht 70.5 in | Wt 222.2 lb

## 2015-11-10 DIAGNOSIS — C8308 Small cell B-cell lymphoma, lymph nodes of multiple sites: Secondary | ICD-10-CM

## 2015-11-10 DIAGNOSIS — Z85118 Personal history of other malignant neoplasm of bronchus and lung: Secondary | ICD-10-CM | POA: Diagnosis not present

## 2015-11-10 DIAGNOSIS — C3432 Malignant neoplasm of lower lobe, left bronchus or lung: Secondary | ICD-10-CM

## 2015-11-10 DIAGNOSIS — C8300 Small cell B-cell lymphoma, unspecified site: Secondary | ICD-10-CM

## 2015-11-10 NOTE — Telephone Encounter (Signed)
Gave and printed appt sched and avs for pt for April 2018

## 2015-11-10 NOTE — Progress Notes (Signed)
[facility] [phone]   Fax:([phone]  OFFICE PROGRESS NOTE  Kyle Homans, MD [address] [place] [address]  DIAGNOSIS AND STAGE:  1) small lymphocytic lymphoma involving the left supraclavicular lymph nodes diagnosed in February 2016. 2) Stage IIA (T1a, N1, M0) non-small cell lung cancer consistent with adenocarcinoma with negative EGFR mutation diagnosed in March of 2013.   PRIOR THERAPY:  1) S/P left video-assisted thoracoscopy, wedge resection of left lower lobe nodule, thoracoscopic left lower lobectomy, mediastinal lymph node dissection on 11/02/2011.  2) Adjuvant chemotherapy with cisplatin 75 mg/M2 and Alimta 500 mg/M2 every 3 weeks. She is status post 4 cycles.   CURRENT THERAPY: Observation.  CHEMOTHERAPY INTENT: Curative/adjuvant  CURRENT # OF CHEMOTHERAPY CYCLES: 0  CURRENT ANTIEMETICS: Compazine  CURRENT SMOKING STATUS: Currently nonsmoker  ORAL CHEMOTHERAPY AND CONSENT: None  CURRENT BISPHOSPHONATES USE: None  PAIN MANAGEMENT: 0/10  NARCOTICS INDUCED CONSTIPATION: N/A  LIVING WILL AND CODE STATUS: Full code initially. INTERVAL HISTORY: Kyle Long 70 y.o. male returns to the clinic today for follow-up visit. The patient has been observation for the last few years with no concerning complaints. He exercises at regular basis and intentionally lost few pounds. He denied having any significant fever or chills. He has no significant weight loss or night sweats. The patient denied having any significant chest pain, shortness of breath or hemoptysis. He has repeat bloodwork as well as CT scan of the chest, abdomen and pelvis performed recently and he is here for evaluation and discussion of his lab and scan results.   MEDICAL HISTORY: Past Medical History  Diagnosis Date  . Hypertension   . Peyronie disease   . Gilbert's syndrome   . Hyperlipidemia   . Amnesia 2009    isolated;for 30 mins  w/elevated BP  . Hematuria 2009    Dr Burnell Blanks  . Hyperglycemia   . Diverticulosis of colon   . Cellulitis 2011    LUE (no PMH of MRSA)/left elbow  . Peptic ulcer 1995  . Recurrent upper respiratory infection (URI)     09/2011  BRONCHITIS  . GERD (gastroesophageal reflux disease)     OCC HEARTBURN  . Neuromuscular disorder ([place])     2010 TEMPORARY MEMORY LOSS 1-1.5 HR WAS CHECKED OUT AT [facility] , NOTHING FOUND  . Arthritis   . Cancer ([place])     skin / top head  . Lung cancer, lower lobe (HCC)     Stage IIA (T1,N1)  . Diverticulosis   . Allergic state 06/26/2015    ALLERGIES:  is allergic to lisinopril.  MEDICATIONS:  Current Outpatient Prescriptions  Medication Sig Dispense Refill  . diltiazem (CARDIZEM CD) 240 MG 24 hr capsule Take 1 capsule (240 mg total) by mouth daily. 90 capsule 3  . losartan (COZAAR) 100 MG tablet Take 0.5 tablets (50 mg total) by mouth daily. 45 tablet 3  . metoprolol tartrate (LOPRESSOR) 25 MG tablet Take 1 tablet (25 mg total) by mouth 2 (two) times daily. 180 tablet 3   No current facility-administered medications for this visit.    SURGICAL HISTORY:  Past Surgical History  Procedure Laterality Date  . Colonoscopy w/ polypectomy  2005    due 2015  . Vasectomy    . Cystoscopy  2009  . Vein surgery      R ankle  . Lobectomy  11/02/2011    Procedure: LOBECTOMY;  Surgeon: Melrose Nakayama, MD;  Location: [facility];  Service: Thoracic;  Laterality: Left;  LEFT LOWER LOBECTOMY  . Refractive surgery      Bil  . Chemo treatment       4 treatments that last 7 hours each/last treatmebt in 2013  . Skin biopsy      4 0r 5  . Supraclavical node biopsy Left 10/15/2014    Procedure: LEFT SUPRACLAVICAL LYMPH NODE BIOPSY;  Surgeon: Melrose Nakayama, MD;  Location: [facility];  Service: Thoracic;  Laterality: Left;  LEFT SUPRACLAVICAL LYMPH NODE BIOPSY    REVIEW OF SYSTEMS:  A comprehensive review of systems was negative.   PHYSICAL EXAMINATION:  General appearance: alert, cooperative and no distress Head: Normocephalic, without obvious abnormality, atraumatic Neck: no adenopathy, no JVD, supple, symmetrical, trachea midline and thyroid not enlarged, symmetric, no tenderness/mass/nodules Lymph nodes: Cervical, supraclavicular, and axillary nodes normal. Resp: clear to auscultation bilaterally Back: symmetric, no curvature. ROM normal. No CVA tenderness. Cardio: regular rate and rhythm, S1, S2 normal, no murmur, click, rub or gallop GI: soft, non-tender; bowel sounds normal; no masses,  no organomegaly Extremities: extremities normal, atraumatic, no cyanosis or edema Neurologic: Alert and oriented X 3, normal strength and tone. Normal symmetric reflexes. Normal coordination and gait  ECOG PERFORMANCE STATUS: 1 - Symptomatic but completely ambulatory  Blood pressure 162/79, pulse 72, temperature 98.4 F (36.9 C), temperature source Oral, resp. rate 18, height 5' 10.5" (1.791 m), weight 222 lb 3.2 oz (100.789 kg), SpO2 99 %.  LABORATORY DATA: Lab Results  Component Value Date   WBC 7.3 11/07/2015   HGB 14.6 11/07/2015   HCT 43.4 11/07/2015   MCV 96.9 11/07/2015   PLT 169 11/07/2015      Chemistry      Component Value Date/Time   NA 139 11/07/2015 1110   NA 136 12/02/2014 1047   K 4.3 11/07/2015 1110   K 3.8 12/02/2014 1047   CL 102 12/02/2014 1047   CL 104 09/03/2012 1003   CO2 25 11/07/2015 1110   CO2 28 12/02/2014 1047   BUN 20.2 11/07/2015 1110   BUN 18 12/02/2014 1047   CREATININE 1.3 11/07/2015 1110   CREATININE 1.41 12/02/2014 1047      Component Value Date/Time   CALCIUM 10.1 11/07/2015 1110   CALCIUM 9.9 12/02/2014 1047   ALKPHOS 55 11/07/2015 1110   ALKPHOS 62 12/02/2014 1047   AST 31 11/07/2015 1110   AST 22 12/02/2014 1047   ALT 35 11/07/2015 1110   ALT 28 12/02/2014 1047   BILITOT 1.19 11/07/2015 1110   BILITOT 0.8 12/02/2014 1047       RADIOGRAPHIC STUDIES: Ct Abdomen Pelvis Wo  Contrast  11/07/2015  CLINICAL DATA:  Lymphoma, lung cancer, chemotherapy complete. EXAM: CT CHEST, ABDOMEN AND PELVIS WITHOUT CONTRAST TECHNIQUE: Multidetector CT imaging of the chest, abdomen and pelvis was performed following the standard protocol without IV contrast. COMPARISON:  PET 09/17/2014, CT chest 09/06/2014. FINDINGS: CT CHEST FINDINGS Mediastinum/Lymph Nodes: Mediastinal lymph nodes measure up to 10 mm in the high left paratracheal region (2/14), stable. Hilar regions are difficult to definitively evaluate without IV contrast. There are numerous sub cm short axis lymph nodes in the axillary regions. No pathologically enlarged lymph nodes, however. Atherosclerotic calcification of the arterial vasculature. Heart size normal. No pericardial effusion. Lungs/Pleura: 8 mm ground-glass nodule in the right upper lobe (4/48), unchanged from 10/10/2011. Left lower lobectomy. No pleural fluid. Airway is unremarkable. Musculoskeletal: No worrisome lytic or sclerotic lesions. Thoracotomy changes on the left.  CT ABDOMEN PELVIS FINDINGS Hepatobiliary: Low-attenuation lesions in the liver measure up to 2.4 cm in the dome, stable. Gallbladder is unremarkable. No biliary ductal dilatation. Pancreas: Negative. Spleen: Negative. Adrenals/Urinary Tract: Adrenal glands and kidneys are unremarkable. Ureters are decompressed. Bladder is unremarkable. Stomach/Bowel: Small posterior gastric diverticulum. Duodenal diverticulum. Small bowel, appendix and colon are otherwise unremarkable. Vascular/Lymphatic: Atherosclerotic calcification of the arterial vasculature without abdominal aortic aneurysm. Abdominal peritoneal ligament and periportal lymph nodes measure up to 11 mm in the periportal region (2/64), stable. Retroperitoneal lymph nodes measure up to 12 mm in the left periaortic station (2/69), previously 10 mm. Numerous lymph nodes extend along the abdominal aorta and iliac chains, stable from 09/17/2014. Small bowel  mesenteric lymph nodes measure up to 2.3 cm (2/87), previously 1.9 cm. Reproductive: Prostate is enlarged. Other: No free fluid. Small periumbilical hernia contains fat. Mesenteries and peritoneum are otherwise unremarkable. Musculoskeletal: No worrisome lytic or sclerotic lesions. IMPRESSION: 1. Slight increase in size of small bowel mesenteric lymph nodes. Otherwise, small borderline enlarged lymph nodes in the chest, abdomen and pelvis are stable. 2. Enlarged prostate. Electronically Signed   By: Lorin Picket M.D.   On: 11/07/2015 14:52   Ct Chest Wo Contrast  11/07/2015  CLINICAL DATA:  Lymphoma, lung cancer, chemotherapy complete. EXAM: CT CHEST, ABDOMEN AND PELVIS WITHOUT CONTRAST TECHNIQUE: Multidetector CT imaging of the chest, abdomen and pelvis was performed following the standard protocol without IV contrast. COMPARISON:  PET 09/17/2014, CT chest 09/06/2014. FINDINGS: CT CHEST FINDINGS Mediastinum/Lymph Nodes: Mediastinal lymph nodes measure up to 10 mm in the high left paratracheal region (2/14), stable. Hilar regions are difficult to definitively evaluate without IV contrast. There are numerous sub cm short axis lymph nodes in the axillary regions. No pathologically enlarged lymph nodes, however. Atherosclerotic calcification of the arterial vasculature. Heart size normal. No pericardial effusion. Lungs/Pleura: 8 mm ground-glass nodule in the right upper lobe (4/48), unchanged from 10/10/2011. Left lower lobectomy. No pleural fluid. Airway is unremarkable. Musculoskeletal: No worrisome lytic or sclerotic lesions. Thoracotomy changes on the left. CT ABDOMEN PELVIS FINDINGS Hepatobiliary: Low-attenuation lesions in the liver measure up to 2.4 cm in the dome, stable. Gallbladder is unremarkable. No biliary ductal dilatation. Pancreas: Negative. Spleen: Negative. Adrenals/Urinary Tract: Adrenal glands and kidneys are unremarkable. Ureters are decompressed. Bladder is unremarkable. Stomach/Bowel: Small  posterior gastric diverticulum. Duodenal diverticulum. Small bowel, appendix and colon are otherwise unremarkable. Vascular/Lymphatic: Atherosclerotic calcification of the arterial vasculature without abdominal aortic aneurysm. Abdominal peritoneal ligament and periportal lymph nodes measure up to 11 mm in the periportal region (2/64), stable. Retroperitoneal lymph nodes measure up to 12 mm in the left periaortic station (2/69), previously 10 mm. Numerous lymph nodes extend along the abdominal aorta and iliac chains, stable from 09/17/2014. Small bowel mesenteric lymph nodes measure up to 2.3 cm (2/87), previously 1.9 cm. Reproductive: Prostate is enlarged. Other: No free fluid. Small periumbilical hernia contains fat. Mesenteries and peritoneum are otherwise unremarkable. Musculoskeletal: No worrisome lytic or sclerotic lesions. IMPRESSION: 1. Slight increase in size of small bowel mesenteric lymph nodes. Otherwise, small borderline enlarged lymph nodes in the chest, abdomen and pelvis are stable. 2. Enlarged prostate. Electronically Signed   By: Lorin Picket M.D.   On: 11/07/2015 14:52    ASSESSMENT AND PLAN: This is a very pleasant 70 years old white male with: 1) stage IIA non-small cell lung cancer, adenocarcinoma status post wedge resection of the left lower lobe with lymph node dissection followed by adjuvant chemotherapy with cisplatin and  Alimta. He is currently on observation with no evidence for disease recurrence. 2) stage II small lymphocytic lymphoma with multiple small lymph nodes in the neck, chest as well as abdomen and pelvis. The patient is currently asymptomatic. He has no concerning complaints since the last visit. The recent CT scan of the chest, abdomen and pelvis showed no concerning findings for disease progression except for slight increase in size of small bowel mesenteric lymph nodes. I discussed the scan results with the patient today. I recommended for him continuous  observation for now with repeat CBC, comprehensive metabolic panel and LDH in addition to repeat CT scan of the chest, abdomen and pelvis in one year. He was advised to call immediately if he has any concerning symptoms in the interval. The patient voices understanding of current disease status and treatment options and is in agreement with the current care plan.  All questions were answered. The patient knows to call the clinic with any problems, questions or concerns. We can certainly see the patient much sooner if necessary.  Disclaimer: This note was dictated with voice recognition software. Similar sounding words can inadvertently be transcribed and may not be corrected upon review.

## 2015-11-14 ENCOUNTER — Telehealth: Payer: Self-pay | Admitting: Behavioral Health

## 2015-11-14 NOTE — Telephone Encounter (Signed)
Appointment confirmed for Medicare Wellness visit 11/15/15 at 10:30 AM with Mariane Duval, RN.

## 2015-11-15 ENCOUNTER — Ambulatory Visit (INDEPENDENT_AMBULATORY_CARE_PROVIDER_SITE_OTHER): Payer: Commercial Managed Care - HMO

## 2015-11-15 VITALS — BP 158/84 | HR 68 | Ht 71.0 in | Wt 219.4 lb

## 2015-11-15 DIAGNOSIS — Z0000 Encounter for general adult medical examination without abnormal findings: Secondary | ICD-10-CM

## 2015-11-15 DIAGNOSIS — Z1283 Encounter for screening for malignant neoplasm of skin: Secondary | ICD-10-CM | POA: Diagnosis not present

## 2015-11-15 NOTE — Patient Instructions (Addendum)
Continue doing brain stimulating activities (puzzles, reading, adult coloring books, staying active) to keep memory sharp.   Eat heart healthy diet (full of fruits, vegetables, whole grains, lean protein, water--limit salt, fat, and sugar intake) and increase physical activity as tolerated.  Call us back if you need referrals placed for Dr. Earlie Server or Dr. Kris Mouton.    You should hear back from the office soon regarding your dermatology referral.    Follow up with Dr. Charlett Blake as scheduled for CPE and labs.    Fat and Cholesterol Restricted Diet High levels of fat and cholesterol in your blood may lead to various health problems, such as diseases of the heart, blood vessels, gallbladder, liver, and pancreas. Fats are concentrated sources of energy that come in various forms. Certain types of fat, including saturated fat, may be harmful in excess. Cholesterol is a substance needed by your body in small amounts. Your body makes all the cholesterol it needs. Excess cholesterol comes from the food you eat. When you have high levels of cholesterol and saturated fat in your blood, health problems can develop because the excess fat and cholesterol will gather along the walls of your blood vessels, causing them to narrow. Choosing the right foods will help you control your intake of fat and cholesterol. This will help keep the levels of these substances in your blood within normal limits and reduce your risk of disease. WHAT IS MY PLAN? Your health care provider recommends that you:  Get no more than __________ % of the total calories in your daily diet from fat.  Limit your intake of saturated fat to less than ______% of your total calories each day.  Limit the amount of cholesterol in your diet to less than _________mg per day. WHAT TYPES OF FAT SHOULD I CHOOSE?  Choose healthy fats more often. Choose monounsaturated and polyunsaturated fats, such as olive and canola oil, flaxseeds, walnuts, almonds,  and seeds.  Eat more omega-3 fats. Good choices include salmon, mackerel, sardines, tuna, flaxseed oil, and ground flaxseeds. Aim to eat fish at least two times a week.  Limit saturated fats. Saturated fats are primarily found in animal products, such as meats, butter, and cream. Plant sources of saturated fats include palm oil, palm kernel oil, and coconut oil.  Avoid foods with partially hydrogenated oils in them. These contain trans fats. Examples of foods that contain trans fats are stick margarine, some tub margarines, cookies, crackers, and other baked goods. WHAT GENERAL GUIDELINES DO I NEED TO FOLLOW? These guidelines for healthy eating will help you control your intake of fat and cholesterol:  Check food labels carefully to identify foods with trans fats or high amounts of saturated fat.  Fill one half of your plate with vegetables and green salads.  Fill one fourth of your plate with whole grains. Look for the word "whole" as the first word in the ingredient list.  Fill one fourth of your plate with lean protein foods.  Limit fruit to two servings a day. Choose fruit instead of juice.  Eat more foods that contain soluble fiber. Examples of foods that contain this type of fiber are apples, broccoli, carrots, beans, peas, and barley. Aim to get 20-30 g of fiber per day.  Eat more home-cooked food and less restaurant, buffet, and fast food.  Limit or avoid alcohol.  Limit foods high in starch and sugar.  Limit fried foods.  Cook foods using methods other than frying. Baking, boiling, grilling, and broiling are all  great options.  Lose weight if you are overweight. Losing just 5-10% of your initial body weight can help your overall health and prevent diseases such as diabetes and heart disease. WHAT FOODS CAN I EAT? Grains Whole grains, such as whole wheat or whole grain breads, crackers, cereals, and pasta. Unsweetened oatmeal, bulgur, barley, quinoa, or brown rice. Corn or  whole wheat flour tortillas. Vegetables Fresh or frozen vegetables (raw, steamed, roasted, or grilled). Green salads. Fruits All fresh, canned (in natural juice), or frozen fruits. Meat and Other Protein Products Ground beef (85% or leaner), grass-fed beef, or beef trimmed of fat. Skinless chicken or [place]. Ground chicken or [place]. Pork trimmed of fat. All fish and seafood. Eggs. Dried beans, peas, or lentils. Unsalted nuts or seeds. Unsalted canned or dry beans. Dairy Low-fat dairy products, such as skim or 1% milk, 2% or reduced-fat cheeses, low-fat ricotta or cottage cheese, or plain low-fat yogurt. Fats and Oils Tub margarines without trans fats. Light or reduced-fat mayonnaise and salad dressings. Avocado. Olive, canola, sesame, or safflower oils. Natural peanut or almond butter (choose ones without added sugar and oil). The items listed above may not be a complete list of recommended foods or beverages. Contact your dietitian for more options. WHAT FOODS ARE NOT RECOMMENDED? Grains White bread. White pasta. White rice. Cornbread. Bagels, pastries, and croissants. Crackers that contain trans fat. Vegetables White potatoes. Corn. Creamed or fried vegetables. Vegetables in a cheese sauce. Fruits Dried fruits. Canned fruit in light or heavy syrup. Fruit juice. Meat and Other Protein Products Fatty cuts of meat. Ribs, chicken wings, bacon, sausage, bologna, salami, chitterlings, fatback, hot dogs, bratwurst, and packaged luncheon meats. Liver and organ meats. Dairy Whole or 2% milk, cream, half-and-half, and cream cheese. Whole milk cheeses. Whole-fat or sweetened yogurt. Full-fat cheeses. Nondairy creamers and whipped toppings. Processed cheese, cheese spreads, or cheese curds. Sweets and Desserts Corn syrup, sugars, honey, and molasses. Candy. Jam and jelly. Syrup. Sweetened cereals. Cookies, pies, cakes, donuts, muffins, and ice cream. Fats and Oils Butter, stick margarine, lard,  shortening, ghee, or bacon fat. Coconut, palm kernel, or palm oils. Beverages Alcohol. Sweetened drinks (such as sodas, lemonade, and fruit drinks or punches). The items listed above may not be a complete list of foods and beverages to avoid. Contact your dietitian for more information.   This information is not intended to replace advice given to you by your health care provider. Make sure you discuss any questions you have with your health care provider.   Document Released: 07/16/2005 Document Revised: 08/06/2014 Document Reviewed: 10/14/2013 Elsevier Interactive Patient Education 2016 [place] Maintenance, Male A healthy lifestyle and preventative care can promote health and wellness.  Maintain regular health, dental, and eye exams.  Eat a healthy diet. Foods like vegetables, fruits, whole grains, low-fat dairy products, and lean protein foods contain the nutrients you need and are low in calories. Decrease your intake of foods high in solid fats, added sugars, and salt. Get information about a proper diet from your health care provider, if necessary.  Regular physical exercise is one of the most important things you can do for your health. Most adults should get at least 150 minutes of moderate-intensity exercise (any activity that increases your heart rate and causes you to sweat) each week. In addition, most adults need muscle-strengthening exercises on 2 or more days a week.   Maintain a healthy weight. The body mass index (BMI) is a screening tool to identify possible weight  problems. It provides an estimate of body fat based on height and weight. Your health care provider can find your BMI and can help you achieve or maintain a healthy weight. For males 20 years and older:  A BMI below 18.5 is considered underweight.  A BMI of 18.5 to 24.9 is normal.  A BMI of 25 to 29.9 is considered overweight.  A BMI of 30 and above is considered obese.  Maintain normal blood  lipids and cholesterol by exercising and minimizing your intake of saturated fat. Eat a balanced diet with plenty of fruits and vegetables. Blood tests for lipids and cholesterol should begin at age 72 and be repeated every 5 years. If your lipid or cholesterol levels are high, you are over age 23, or you are at high risk for heart disease, you may need your cholesterol levels checked more frequently.Ongoing high lipid and cholesterol levels should be treated with medicines if diet and exercise are not working.  If you smoke, find out from your health care provider how to quit. If you do not use tobacco, do not start.  Lung cancer screening is recommended for adults aged 81-80 years who are at high risk for developing lung cancer because of a history of smoking. A yearly low-dose CT scan of the lungs is recommended for people who have at least a 30-pack-year history of smoking and are current smokers or have quit within the past 15 years. A pack year of smoking is smoking an average of 1 pack of cigarettes a day for 1 year (for example, a 30-pack-year history of smoking could mean smoking 1 pack a day for 30 years or 2 packs a day for 15 years). Yearly screening should continue until the smoker has stopped smoking for at least 15 years. Yearly screening should be stopped for people who develop a health problem that would prevent them from having lung cancer treatment.  If you choose to drink alcohol, do not have more than 2 drinks per day. One drink is considered to be 12 oz (360 mL) of beer, 5 oz (150 mL) of wine, or 1.5 oz (45 mL) of liquor.  Avoid the use of street drugs. Do not share needles with anyone. Ask for help if you need support or instructions about stopping the use of drugs.  High blood pressure causes heart disease and increases the risk of stroke. High blood pressure is more likely to develop in:  People who have blood pressure in the end of the normal range (100-139/85-89 mm  Hg).  People who are overweight or obese.  People who are African American.  If you are 49-14 years of age, have your blood pressure checked every 3-5 years. If you are 39 years of age or older, have your blood pressure checked every year. You should have your blood pressure measured twice--once when you are at a hospital or clinic, and once when you are not at a hospital or clinic. Record the average of the two measurements. To check your blood pressure when you are not at a hospital or clinic, you can use:  An automated blood pressure machine at a pharmacy.  A home blood pressure monitor.  If you are 51-41 years old, ask your health care provider if you should take aspirin to prevent heart disease.  Diabetes screening involves taking a blood sample to check your fasting blood sugar level. This should be done once every 3 years after age 48 if you are at a normal  weight and without risk factors for diabetes. Testing should be considered at a younger age or be carried out more frequently if you are overweight and have at least 1 risk factor for diabetes.  Colorectal cancer can be detected and often prevented. Most routine colorectal cancer screening begins at the age of 65 and continues through age 63. However, your health care provider may recommend screening at an earlier age if you have risk factors for colon cancer. On a yearly basis, your health care provider may provide home test kits to check for hidden blood in the stool. A small camera at the end of a tube may be used to directly examine the colon (sigmoidoscopy or colonoscopy) to detect the earliest forms of colorectal cancer. Talk to your health care provider about this at age 79 when routine screening begins. A direct exam of the colon should be repeated every 5-10 years through age 65, unless early forms of precancerous polyps or small growths are found.  People who are at an increased risk for hepatitis B should be screened for this  virus. You are considered at high risk for hepatitis B if:  You were born in a country where hepatitis B occurs often. Talk with your health care provider about which countries are considered high risk.  Your parents were born in a high-risk country and you have not received a shot to protect against hepatitis B (hepatitis B vaccine).  You have HIV or AIDS.  You use needles to inject street drugs.  You live with, or have sex with, someone who has hepatitis B.  You are a man who has sex with other men (MSM).  You get hemodialysis treatment.  You take certain medicines for conditions like cancer, organ transplantation, and autoimmune conditions.  Hepatitis C blood testing is recommended for all people born from [birth year] through 1967 and any individual with known risk factors for hepatitis C.  Healthy men should no longer receive prostate-specific antigen (PSA) blood tests as part of routine cancer screening. Talk to your health care provider about prostate cancer screening.  Testicular cancer screening is not recommended for adolescents or adult males who have no symptoms. Screening includes self-exam, a health care provider exam, and other screening tests. Consult with your health care provider about any symptoms you have or any concerns you have about testicular cancer.  Practice safe sex. Use condoms and avoid high-risk sexual practices to reduce the spread of sexually transmitted infections (STIs).  You should be screened for STIs, including gonorrhea and chlamydia if:  You are sexually active and are younger than 24 years.  You are older than 24 years, and your health care provider tells you that you are at risk for this type of infection.  Your sexual activity has changed since you were last screened, and you are at an increased risk for chlamydia or gonorrhea. Ask your health care provider if you are at risk.  If you are at risk of being infected with HIV, it is recommended that  you take a prescription medicine daily to prevent HIV infection. This is called pre-exposure prophylaxis (PrEP). You are considered at risk if:  You are a man who has sex with other men (MSM).  You are a heterosexual man who is sexually active with multiple partners.  You take drugs by injection.  You are sexually active with a partner who has HIV.  Talk with your health care provider about whether you are at high risk of being infected  with HIV. If you choose to begin PrEP, you should first be tested for HIV. You should then be tested every 3 months for as long as you are taking PrEP.  Use sunscreen. Apply sunscreen liberally and repeatedly throughout the day. You should seek shade when your shadow is shorter than you. Protect yourself by wearing long sleeves, pants, a wide-brimmed hat, and sunglasses year round whenever you are outdoors.  Tell your health care provider of new moles or changes in moles, especially if there is a change in shape or color. Also, tell your health care provider if a mole is larger than the size of a pencil eraser.  A one-time screening for abdominal aortic aneurysm (AAA) and surgical repair of large AAAs by ultrasound is recommended for men aged 62-75 years who are current or former smokers.  Stay current with your vaccines (immunizations).   This information is not intended to replace advice given to you by your health care provider. Make sure you discuss any questions you have with your health care provider.   Document Released: 01/12/2008 Document Revised: 08/06/2014 Document Reviewed: 12/11/2010 Elsevier Interactive Patient Education [employer].

## 2015-11-15 NOTE — Progress Notes (Signed)
Pre visit review using our clinic review tool, if applicable. No additional management support is needed unless otherwise documented below in the visit note. 

## 2015-11-15 NOTE — Progress Notes (Addendum)
Subjective:   Kyle Long is a 70 y.o. male who presents for Medicare Annual/Subsequent preventive examination.  Review of Systems: No ROS  Cardiac Risk Factors include: advanced age (>31mn, >>59women);male gender;hypertension; Elevated triglycerides  Sleep patterns:   Sleeps at least 7-8 hours per night/wakes up some nights 2-3 times per night to void.  Home Safety/Smoke Alarms:  Feels safe at home.   Lives in with wife in 2 story home with a downstairs basement.  Smokes alarms and security system present.   Firearm Safety:  Kept in safe place.   Seat Belt Safety/Bike Helmet:  Always wears seat belt.    Counseling:   Eye Exam- 2 months ago per patient Dental-Goes annually.  Family Dental.   Male:  CCS- 10/26/13- 2 polyps-repeat in 5 years (2020), Dr. PHilarie Fredrickson  PSA- 09/04/12-1.86   Objective:    Vitals: BP 158/84 mmHg  Pulse 68  Ht 5' 11"  (1.803 m)  Wt 219 lb 6.4 oz (99.519 kg)  BMI 30.61 kg/m2  SpO2 98%  Body mass index is 30.61 kg/(m^2).  Tobacco History  Smoking status  . Former Smoker -- 1.00 packs/day for 15 years  . Quit date: 07/31/1979  Smokeless tobacco  . Never Used     Counseling given: No   Past Medical History  Diagnosis Date  . Hypertension   . Peyronie disease   . Gilbert's syndrome   . Hyperlipidemia   . Amnesia 2009    isolated;for 30 mins w/elevated BP  . Hematuria 2009    Dr PBurnell Blanks . Hyperglycemia   . Diverticulosis of colon   . Cellulitis 2011    LUE (no PMH of MRSA)/left elbow  . Peptic ulcer 1995  . Recurrent upper respiratory infection (URI)     09/2011  BRONCHITIS  . GERD (gastroesophageal reflux disease)     OCC HEARTBURN  . Neuromuscular disorder ([facility]     2010 TEMPORARY MEMORY LOSS 1-1.5 HR WAS CHECKED OUT AT [facility] , NOTHING FOUND  . Arthritis   . Cancer [place]     skin / top head  . Lung cancer, lower lobe (HCC)     Stage IIA (T1,N1)  . Diverticulosis   . Allergic state 06/26/2015  . Primary cancer of left  lower lobe of lung ([facility] 11/27/2011   Past Surgical History  Procedure Laterality Date  . Colonoscopy w/ polypectomy  2005    due 2015  . Vasectomy    . Cystoscopy  2009  . Vein surgery      R ankle  . Lobectomy  11/02/2011    Procedure: LOBECTOMY;  Surgeon: SMelrose Nakayama MD;  [facility]  Service: Thoracic;  Laterality: Left;  LEFT LOWER LOBECTOMY  . Refractive surgery      Bil  . Chemo treatment       4 treatments that last 7 hours each/last treatmebt in 2013  . Skin biopsy      4 0r 5  . Supraclavical node biopsy Left 10/15/2014    Procedure: LEFT SUPRACLAVICAL LYMPH NODE BIOPSY;  Surgeon: SMelrose Nakayama MD;  [facility]  Service: Thoracic;  Laterality: Left;  LEFT SUPRACLAVICAL LYMPH NODE BIOPSY   Family History  Problem Relation Age of Onset  . Stroke Mother     in 778s . Hypertension Mother   . Breast cancer Mother   . Neuropathy Mother   . Melanoma Father   . Heart attack Father 67  stents  . Hypertension Sister   . Thyroid disease Sister     ??  . Multiple myeloma Sister   . Cancer Sister   . Diabetes Paternal Aunt   . Stomach cancer Maternal Grandfather   . Lung cancer Maternal Grandfather   . Cancer Paternal Grandmother     lung, nonsmoker  . Headache Daughter    History  Sexual Activity  . Sexual Activity: Not on file    Comment: lives with wife, retired from [employer], ran distribution. No dietary restrictions.    Outpatient Encounter Prescriptions as of 11/15/2015  Medication Sig  . diltiazem (CARDIZEM CD) 240 MG 24 hr capsule Take 1 capsule (240 mg total) by mouth daily.  [name] Kitchen losartan (COZAAR) 100 MG tablet Take 0.5 tablets (50 mg total) by mouth daily.  . metoprolol tartrate (LOPRESSOR) 25 MG tablet Take 1 tablet (25 mg total) by mouth 2 (two) times daily.   No facility-administered encounter medications on file as of 11/15/2015.    Activities of Daily Living In your present state of health, do you have any difficulty  performing the following activities: 11/15/2015  Hearing? N  Vision? N  Difficulty concentrating or making decisions? N  Walking or climbing stairs? N  Dressing or bathing? N  Doing errands, shopping? N  Preparing Food and eating ? N  Using the Toilet? N  In the past six months, have you accidently leaked urine? N  Do you have problems with loss of bowel control? N  Managing your Medications? N  Managing your Finances? N  Housekeeping or managing your Housekeeping? N    Patient Care Team: [name], MD as PCP - General (Family Medicine) Linde Gillis, MD as Referring Physician (Nephrology) Curt Bears, MD as Consulting Physician (Oncology)   Assessment:  Deferred to PCP  Exercise Activities and Dietary recommendations Current Exercise Habits: Structured exercise class (Goes to [facility]), Type of exercise: strength training/weights;treadmill, Time (Minutes): 45, Frequency (Times/Week): 3, Weekly Exercise (Minutes/Week): 135   Diet:  Regular diet, limits pork.  3 meals per day.  Has cut back on salt.  Drinks water thoughtout the day (at least 3 16oz bottles per day).     Goals    . Lose 10 lbs May 16, 2016      Fall Risk Fall Risk  11/15/2015 08/05/2014 03/16/2014 06/15/2013 06/15/2013  Falls in the past year? No No No No No   Depression Screen PHQ 2/9 Scores 11/15/2015 08/05/2014 06/15/2013  PHQ - 2 Score 0 0 0    Cognitive Testing MMSE - Mini Mental State Exam 11/15/2015  Orientation to time 5  Orientation to Place 5  Registration 3  Attention/ Calculation 5  Recall 3  Language- name 2 objects 2  Language- repeat 1  Language- follow 3 step command 3  Language- read & follow direction 1  Write a sentence 1  Copy design 1  Total score 30    Immunization History  Administered Date(s) Administered  . Influenza,inj,Quad PF,36+ Mos 06/17/2015  . Pneumococcal Conjugate-13 12/13/2014  . Pneumococcal Polysaccharide-23 02/01/2010  . Td 09/27/2001  . Tdap  09/04/2012   Screening Tests Health Maintenance  Topic Date Due  . ZOSTAVAX  11/14/2016 (Originally 04/11/2004)  . Hepatitis C Screening  11/14/2016 (Originally 1945/11/20)  . INFLUENZA VACCINE  02/28/2016  . COLONOSCOPY  10/27/2018  . TETANUS/TDAP  09/04/2022  . PNA vac Low Risk Adult  Completed      Plan:  Continue doing brain stimulating  activities (puzzles, reading, adult coloring books, staying active) to keep memory sharp.   Eat heart healthy diet (full of fruits, vegetables, whole grains, lean protein, water--limit salt, fat, and sugar intake) and increase physical activity as tolerated.  Call us back if you need referrals placed for Dr. Earlie Server or Dr. Kris Mouton.    You should hear back from the office soon regarding your dermatology referral.    Follow up with Dr. Charlett Blake as scheduled for for CPE and labs.    During the course of the visit the patient was educated and counseled about the following appropriate screening and preventive services:   Vaccines to include Pneumoccal, Influenza, Hepatitis B, Td, Zostavax, HCV  Electrocardiogram  Cardiovascular Disease  Colorectal cancer screening  Diabetes screening  Prostate Cancer Screening  Glaucoma screening  Nutrition counseling   Smoking cessation counseling  Patient Instructions (the written plan) was given to the patient.    Rudene Anda, RN  11/15/2015   RN AWV note reviewed. Agree with documention and plan.

## 2015-11-18 NOTE — Addendum Note (Signed)
Addended by: Rudene Anda on: 11/18/2015 10:26 AM   Modules accepted: Miquel Dunn

## 2015-11-21 DIAGNOSIS — L570 Actinic keratosis: Secondary | ICD-10-CM | POA: Diagnosis not present

## 2015-11-21 DIAGNOSIS — L579 Skin changes due to chronic exposure to nonionizing radiation, unspecified: Secondary | ICD-10-CM | POA: Diagnosis not present

## 2015-11-21 DIAGNOSIS — L821 Other seborrheic keratosis: Secondary | ICD-10-CM | POA: Diagnosis not present

## 2015-11-21 DIAGNOSIS — Z85828 Personal history of other malignant neoplasm of skin: Secondary | ICD-10-CM | POA: Diagnosis not present

## 2015-12-03 ENCOUNTER — Other Ambulatory Visit: Payer: Self-pay | Admitting: Medical

## 2015-12-03 ENCOUNTER — Other Ambulatory Visit: Payer: Self-pay | Admitting: Family Medicine

## 2015-12-15 ENCOUNTER — Other Ambulatory Visit (INDEPENDENT_AMBULATORY_CARE_PROVIDER_SITE_OTHER): Payer: Commercial Managed Care - HMO

## 2015-12-15 DIAGNOSIS — R739 Hyperglycemia, unspecified: Secondary | ICD-10-CM | POA: Diagnosis not present

## 2015-12-15 DIAGNOSIS — I10 Essential (primary) hypertension: Secondary | ICD-10-CM

## 2015-12-15 DIAGNOSIS — E782 Mixed hyperlipidemia: Secondary | ICD-10-CM

## 2015-12-15 LAB — LIPID PANEL
CHOL/HDL RATIO: 4
Cholesterol: 168 mg/dL (ref 0–200)
HDL: 45.9 mg/dL (ref >39.00)
LDL CALC: 86 mg/dL (ref 0–99)
NONHDL: 121.99
Triglycerides: 180 mg/dL — ABNORMAL HIGH (ref 0.0–149.0)
VLDL: 36 mg/dL (ref 0.0–40.0)

## 2015-12-15 LAB — HEMOGLOBIN A1C: Hgb A1c MFr Bld: 5.4 % (ref 4.6–6.5)

## 2015-12-15 LAB — TSH: TSH: 1.81 u[IU]/mL (ref 0.35–4.50)

## 2015-12-15 NOTE — Addendum Note (Signed)
Addended by: Peggyann Shoals on: 12/15/2015 09:50 AM   Modules accepted: Orders

## 2015-12-16 ENCOUNTER — Ambulatory Visit (INDEPENDENT_AMBULATORY_CARE_PROVIDER_SITE_OTHER): Payer: Commercial Managed Care - HMO | Admitting: Family Medicine

## 2015-12-16 ENCOUNTER — Ambulatory Visit: Payer: Commercial Managed Care - HMO | Admitting: Family Medicine

## 2015-12-16 ENCOUNTER — Encounter: Payer: Self-pay | Admitting: Family Medicine

## 2015-12-16 VITALS — BP 130/74 | HR 82 | Temp 98.3°F | Ht 71.0 in | Wt 223.1 lb

## 2015-12-16 DIAGNOSIS — E782 Mixed hyperlipidemia: Secondary | ICD-10-CM | POA: Diagnosis not present

## 2015-12-16 DIAGNOSIS — I10 Essential (primary) hypertension: Secondary | ICD-10-CM | POA: Diagnosis not present

## 2015-12-16 DIAGNOSIS — E669 Obesity, unspecified: Secondary | ICD-10-CM

## 2015-12-16 DIAGNOSIS — R739 Hyperglycemia, unspecified: Secondary | ICD-10-CM | POA: Diagnosis not present

## 2015-12-16 DIAGNOSIS — Z0000 Encounter for general adult medical examination without abnormal findings: Secondary | ICD-10-CM

## 2015-12-16 NOTE — Progress Notes (Signed)
Subjective:    Patient ID: Kyle Long, male    DOB: 01-14-1946, 70 y.o.   MRN: [id number]  Chief Complaint  Patient presents with  . Annual Exam    HPI Patient is in today for Annual Exam.  Patient presents doing well.  No recetn acute illness. No recent hospitalization. Is eating well. Doing well with ADLs. Denies CP/palp/SOB/HA/congestion/fevers/GI or GU c/o. Taking meds as prescribed  Past Medical History  Diagnosis Date  . Hypertension   . Peyronie disease   . Gilbert's syndrome   . Hyperlipidemia   . Amnesia 2009    isolated;for 30 mins w/elevated BP  . Hematuria 2009    Dr Burnell Blanks  . Hyperglycemia   . Diverticulosis of colon   . Cellulitis 2011    LUE (no PMH of MRSA)/left elbow  . Peptic ulcer 1995  . Recurrent upper respiratory infection (URI)     09/2011  BRONCHITIS  . GERD (gastroesophageal reflux disease)     OCC HEARTBURN  . Neuromuscular disorder ([place])     2010 TEMPORARY MEMORY LOSS 1-1.5 HR WAS CHECKED OUT AT [facility] , NOTHING FOUND  . Arthritis   . Cancer ([place])     skin / top head  . Lung cancer, lower lobe (HCC)     Stage IIA (T1,N1)  . Diverticulosis   . Allergic state 06/26/2015  . Primary cancer of left lower lobe of lung ([place]) 11/27/2011  . Preventative health care 12/19/2015    Past Surgical History  Procedure Laterality Date  . Colonoscopy w/ polypectomy  2005    due 2015  . Vasectomy    . Cystoscopy  2009  . Vein surgery      R ankle  . Lobectomy  11/02/2011    Procedure: LOBECTOMY;  Surgeon: Melrose Nakayama, MD;  Location: [place];  Service: Thoracic;  Laterality: Left;  LEFT LOWER LOBECTOMY  . Refractive surgery      Bil  . Chemo treatment       4 treatments that last 7 hours each/last treatmebt in 2013  . Skin biopsy      4 0r 5  . Supraclavical node biopsy Left 10/15/2014    Procedure: LEFT SUPRACLAVICAL LYMPH NODE BIOPSY;  Surgeon: Melrose Nakayama, MD;  Location: [place];  Service: Thoracic;  Laterality: Left;   LEFT SUPRACLAVICAL LYMPH NODE BIOPSY    Family History  Problem Relation Age of Onset  . Stroke Mother     in 62s  . Hypertension Mother   . Breast cancer Mother   . Neuropathy Mother   . Melanoma Father   . Heart attack Father 55    stents  . Dementia Father   . Hypertension Sister   . Thyroid disease Sister     ??  . Multiple myeloma Sister   . Cancer Sister   . Diabetes Paternal Aunt   . Stomach cancer Maternal Grandfather   . Lung cancer Maternal Grandfather   . Cancer Paternal Grandmother     lung, nonsmoker  . Headache Daughter     Social History   Social History  . Marital Status: Married    Spouse Name: N/A  . Number of Children: N/A  . Years of Education: N/A   Occupational History  . retired    Social History Main Topics  . Smoking status: Former Smoker -- 1.00 packs/day for 15 years    Quit date: 07/31/1979  . Smokeless tobacco: Never Used  . Alcohol  Use: 6.6 oz/week    2 Glasses of wine, 9 Shots of liquor per week     Comment: Daily  . Drug Use: No  . 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   Other Topics Concern  . Not on file   Social History Narrative   Reg exercise    Outpatient Prescriptions Prior to Visit  Medication Sig Dispense Refill  . CARTIA XT 240 MG 24 hr capsule TAKE 1 CAPSULE EVERY DAY 90 capsule 0  . losartan (COZAAR) 100 MG tablet TAKE 1/2 TABLET (50 MG) EVERY DAY 45 tablet 3  . metoprolol tartrate (LOPRESSOR) 25 MG tablet TAKE 1 TABLET TWICE DAILY 180 tablet 0   No facility-administered medications prior to visit.    Allergies  Allergen Reactions  . Lisinopril     cough    Review of Systems  Constitutional: Negative for fever and malaise/fatigue.  HENT: Negative for congestion.   Eyes: Negative for blurred vision.  Respiratory: Negative for shortness of breath.   Cardiovascular: Negative for chest pain, palpitations and  leg swelling.  Gastrointestinal: Negative for nausea, abdominal pain and blood in stool.  Genitourinary: Negative for dysuria and frequency.  Musculoskeletal: Negative for falls.  Skin: Negative for rash.  Neurological: Negative for dizziness, loss of consciousness and headaches.  Endo/Heme/Allergies: Negative for environmental allergies.  Psychiatric/Behavioral: Negative for depression. The patient is not nervous/anxious.        Objective:    Physical Exam  Constitutional: He is oriented to person, place, and time. He appears well-developed and well-nourished. No distress.  HENT:  Head: Normocephalic and atraumatic.  Eyes: Conjunctivae are normal.  Neck: Neck supple. No thyromegaly present.  Cardiovascular: Normal rate, regular rhythm and normal heart sounds.   Pulmonary/Chest: Effort normal and breath sounds normal. No respiratory distress. He has no wheezes.  Abdominal: Soft. Bowel sounds are normal. He exhibits no mass. There is no tenderness.  Musculoskeletal: He exhibits no edema.  Lymphadenopathy:    He has no cervical adenopathy.  Neurological: He is alert and oriented to person, place, and time.  Skin: Skin is warm and dry.  Psychiatric: He has a normal mood and affect. His behavior is normal.    BP 130/74 mmHg  Pulse 82  Temp(Src) 98.3 F (36.8 C) (Oral)  Ht 5' 11"  (1.803 m)  Wt 223 lb 2 oz (101.209 kg)  BMI 31.13 kg/m2  SpO2 97% Wt Readings from Last 3 Encounters:  12/16/15 223 lb 2 oz (101.209 kg)  11/15/15 219 lb 6.4 oz (99.519 kg)  11/10/15 222 lb 3.2 oz (100.789 kg)      Lab Results  Component Value Date   WBC 7.3 11/07/2015   HGB 14.6 11/07/2015   HCT 43.4 11/07/2015   PLT 169 11/07/2015   GLUCOSE 107 11/07/2015   CHOL 168 12/15/2015   TRIG 180.0* 12/15/2015   HDL 45.90 12/15/2015   LDLDIRECT 136.5 06/07/2011   LDLCALC 86 12/15/2015   ALT 35 11/07/2015   AST 31 11/07/2015   NA 139 11/07/2015   K 4.3 11/07/2015   CL 102 12/02/2014    CREATININE 1.3 11/07/2015   BUN 20.2 11/07/2015   CO2 25 11/07/2015   TSH 1.81 12/15/2015   PSA 1.86 09/04/2012   INR 1.03 10/12/2014   HGBA1C 5.4 12/15/2015    Lab Results  Component Value Date   TSH 1.81 12/15/2015   Lab Results  Component Value Date   WBC 7.3 11/07/2015   HGB 14.6 11/07/2015   HCT 43.4 11/07/2015   MCV 96.9 11/07/2015   PLT 169 11/07/2015   Lab Results  Component Value Date   NA 139 11/07/2015   K 4.3 11/07/2015   CHLORIDE 104 11/07/2015   CO2 25 11/07/2015   GLUCOSE 107 11/07/2015   BUN 20.2 11/07/2015   CREATININE 1.3 11/07/2015   BILITOT 1.19 11/07/2015   ALKPHOS 55 11/07/2015   AST 31 11/07/2015   ALT 35 11/07/2015   PROT 8.2 11/07/2015   ALBUMIN 4.3 11/07/2015   CALCIUM 10.1 11/07/2015   ANIONGAP 10 11/07/2015   EGFR 53* 11/07/2015   GFR 52.72* 12/02/2014   Lab Results  Component Value Date   CHOL 168 12/15/2015   Lab Results  Component Value Date   HDL 45.90 12/15/2015   Lab Results  Component Value Date   LDLCALC 86 12/15/2015   Lab Results  Component Value Date   TRIG 180.0* 12/15/2015   Lab Results  Component Value Date   CHOLHDL 4 12/15/2015   Lab Results  Component Value Date   HGBA1C 5.4 12/15/2015       Assessment & Plan:   Problem List Items Addressed This Visit    Preventative health care    Patient encouraged to maintain heart healthy diet, regular exercise, adequate sleep. Consider daily probiotics. Take medications as prescribed. Given and reviewed copy of ACP documents from [employer] and encouraged to complete and return      Obesity    Encouraged DASH diet, decrease po intake and increase exercise as tolerated. Needs 7-8 hours of sleep nightly. Avoid trans fats, eat small, frequent meals every 4-5 hours with lean proteins, complex carbs and healthy fats. Minimize simple carbs, GMO foods.      Hyperlipidemia, mixed    Encouraged heart healthy diet, increase exercise, avoid trans fats,  consider a krill oil cap daily      Hyperglycemia    minimize simple carbs. Increase exercise as tolerated.       Essential hypertension - Primary    Well controlled, no changes to meds. Encouraged heart healthy diet such as the DASH diet and exercise as tolerated.          I am having Mr. Goytia maintain his CARTIA XT, metoprolol tartrate, and losartan.  No orders of the defined types were placed in this encounter.     [name], MD

## 2015-12-16 NOTE — Patient Instructions (Addendum)

## 2015-12-16 NOTE — Assessment & Plan Note (Signed)
Well controlled, no changes to meds. Encouraged heart healthy diet such as the DASH diet and exercise as tolerated.  °

## 2015-12-16 NOTE — Progress Notes (Signed)
Pre visit review using our clinic review tool, if applicable. No additional management support is needed unless otherwise documented below in the visit note. 

## 2015-12-16 NOTE — Assessment & Plan Note (Signed)
Encouraged heart healthy diet, increase exercise, avoid trans fats, consider a krill oil cap daily 

## 2015-12-19 ENCOUNTER — Encounter: Payer: Self-pay | Admitting: Family Medicine

## 2015-12-19 DIAGNOSIS — Z0000 Encounter for general adult medical examination without abnormal findings: Secondary | ICD-10-CM

## 2015-12-19 HISTORY — DX: Encounter for general adult medical examination without abnormal findings: Z00.00

## 2015-12-19 NOTE — Assessment & Plan Note (Signed)
minimize simple carbs. Increase exercise as tolerated.  

## 2015-12-19 NOTE — Assessment & Plan Note (Signed)
Encouraged DASH diet, decrease po intake and increase exercise as tolerated. Needs 7-8 hours of sleep nightly. Avoid trans fats, eat small, frequent meals every 4-5 hours with lean proteins, complex carbs and healthy fats. Minimize simple carbs, GMO foods. 

## 2015-12-19 NOTE — Assessment & Plan Note (Signed)
Patient encouraged to maintain heart healthy diet, regular exercise, adequate sleep. Consider daily probiotics. Take medications as prescribed. Given and reviewed copy of ACP documents from [place] [employer] and encouraged to complete and return 

## 2016-01-16 DIAGNOSIS — L570 Actinic keratosis: Secondary | ICD-10-CM | POA: Diagnosis not present

## 2016-01-16 DIAGNOSIS — D492 Neoplasm of unspecified behavior of bone, soft tissue, and skin: Secondary | ICD-10-CM | POA: Diagnosis not present

## 2016-01-16 DIAGNOSIS — C44629 Squamous cell carcinoma of skin of left upper limb, including shoulder: Secondary | ICD-10-CM | POA: Diagnosis not present

## 2016-01-16 DIAGNOSIS — L579 Skin changes due to chronic exposure to nonionizing radiation, unspecified: Secondary | ICD-10-CM | POA: Diagnosis not present

## 2016-02-07 ENCOUNTER — Other Ambulatory Visit: Payer: Self-pay | Admitting: Family Medicine

## 2016-02-16 DIAGNOSIS — L905 Scar conditions and fibrosis of skin: Secondary | ICD-10-CM | POA: Diagnosis not present

## 2016-02-16 DIAGNOSIS — C44629 Squamous cell carcinoma of skin of left upper limb, including shoulder: Secondary | ICD-10-CM | POA: Diagnosis not present

## 2016-05-31 ENCOUNTER — Other Ambulatory Visit: Payer: Self-pay | Admitting: Family Medicine

## 2016-05-31 ENCOUNTER — Telehealth: Payer: Self-pay | Admitting: Family Medicine

## 2016-05-31 DIAGNOSIS — N289 Disorder of kidney and ureter, unspecified: Secondary | ICD-10-CM

## 2016-05-31 NOTE — Telephone Encounter (Signed)
Patient is calling because he is following up with Rogers Blocker his nephrologist. His appt is scheduled for 06/06/16. He states that he follows up with her every year, but would like to know if he needs a new referral for this appt. Please advise.   Phone: [phone]

## 2016-05-31 NOTE — Telephone Encounter (Signed)
Insurance requires a new doctor to doctor referral ([employer]). Please enter referral.

## 2016-05-31 NOTE — Telephone Encounter (Signed)
Referral placedr

## 2016-06-01 NOTE — Telephone Encounter (Signed)
LVM making patient aware.

## 2016-06-01 NOTE — Telephone Encounter (Signed)
Completed and faxed to Dr. Rogers Blocker, nephrology.

## 2016-06-06 DIAGNOSIS — C3490 Malignant neoplasm of unspecified part of unspecified bronchus or lung: Secondary | ICD-10-CM | POA: Diagnosis not present

## 2016-06-06 DIAGNOSIS — N400 Enlarged prostate without lower urinary tract symptoms: Secondary | ICD-10-CM | POA: Diagnosis not present

## 2016-06-06 DIAGNOSIS — N183 Chronic kidney disease, stage 3 (moderate): Secondary | ICD-10-CM | POA: Diagnosis not present

## 2016-06-06 DIAGNOSIS — R5383 Other fatigue: Secondary | ICD-10-CM | POA: Diagnosis not present

## 2016-06-06 DIAGNOSIS — D649 Anemia, unspecified: Secondary | ICD-10-CM | POA: Diagnosis not present

## 2016-06-06 DIAGNOSIS — I129 Hypertensive chronic kidney disease with stage 1 through stage 4 chronic kidney disease, or unspecified chronic kidney disease: Secondary | ICD-10-CM | POA: Diagnosis not present

## 2016-07-18 ENCOUNTER — Other Ambulatory Visit: Payer: Self-pay | Admitting: Family Medicine

## 2016-07-23 ENCOUNTER — Other Ambulatory Visit: Payer: Self-pay | Admitting: Family Medicine

## 2016-07-27 ENCOUNTER — Other Ambulatory Visit: Payer: Self-pay | Admitting: Nurse Practitioner

## 2016-08-30 DIAGNOSIS — H5203 Hypermetropia, bilateral: Secondary | ICD-10-CM | POA: Diagnosis not present

## 2016-09-24 ENCOUNTER — Encounter: Payer: Self-pay | Admitting: Medical

## 2016-09-24 ENCOUNTER — Ambulatory Visit (INDEPENDENT_AMBULATORY_CARE_PROVIDER_SITE_OTHER): Payer: Medicare HMO | Admitting: Medical

## 2016-09-24 VITALS — BP 163/94 | HR 82 | Temp 98.3°F | Resp 16 | Ht 71.0 in | Wt 226.4 lb

## 2016-09-24 DIAGNOSIS — H6121 Impacted cerumen, right ear: Secondary | ICD-10-CM | POA: Diagnosis not present

## 2016-09-24 NOTE — Progress Notes (Signed)
Subjective:    Patient ID: Kyle Long, male    DOB: [birth year], 71 y.o.   MRN: [id number]  HPI  Pt in for feeling of ears feeling clogged on both sides. Pt states felt on Friday. Pt states no ear pain. No uri or allergy symptoms.  Pt bp when he checks daily 130/80. Always up when he checks in office here. He declines recheck stating he feels fine and has white coat htn.   Review of Systems  Constitutional: Negative for chills, fatigue and fever.  HENT: Negative for congestion, drooling, ear pain, nosebleeds, postnasal drip, rhinorrhea, sinus pain and sinus pressure.        Ear clogged sensation rt side and can't hear.  Respiratory: Negative for cough and shortness of breath.   Cardiovascular: Negative for chest pain and palpitations.  Gastrointestinal: Negative for abdominal pain.  Musculoskeletal: Negative for back pain.  Skin: Negative for rash.  Neurological: Negative for dizziness and headaches.  Hematological: Negative for adenopathy.  Psychiatric/Behavioral: Negative for behavioral problems and confusion.    Past Medical History:  Diagnosis Date  . Allergic state 06/26/2015  . Amnesia 2009   isolated;for 30 mins w/elevated BP  . Arthritis   . Cancer ([place])    skin / top head  . Cellulitis 2011   LUE (no PMH of MRSA)/left elbow  . Diverticulosis   . Diverticulosis of colon   . GERD (gastroesophageal reflux disease)    OCC HEARTBURN  . Gilbert's syndrome   . Hematuria 2009   Dr Burnell Blanks  . Hyperglycemia   . Hyperlipidemia   . Hypertension   . Lung cancer, lower lobe (HCC)    Stage IIA (T1,N1)  . Neuromuscular disorder ([place])    2010 TEMPORARY MEMORY LOSS 1-1.5 HR WAS CHECKED OUT AT [facility] , NOTHING FOUND  . Peptic ulcer 1995  . Peyronie disease   . Preventative health care 12/19/2015  . Primary cancer of left lower lobe of lung ([place]) 11/27/2011  . Recurrent upper respiratory infection ([facility])    09/2011  BRONCHITIS     Social History   Social  History  . Marital status: Married    Spouse name: N/A  . Number of children: N/A  . Years of education: N/A   Occupational History  . retired    Social History Main Topics  . Smoking status: Former Smoker    Packs/day: 1.00    Years: 15.00    Quit date: 07/31/1979  . Smokeless tobacco: Never Used  . Alcohol use 6.6 oz/week    2 Glasses of wine, 9 Shots of liquor per week     Comment: Daily  . Drug use: No  . 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   Other Topics Concern  . Not on file   Social History Narrative   Reg exercise    Past Surgical History:  Procedure Laterality Date  . chemo treatment      4 treatments that last 7 hours each/last treatmebt in 2013  . COLONOSCOPY W/ POLYPECTOMY  2005   due 2015  . CYSTOSCOPY  2009  . LOBECTOMY  11/02/2011   Procedure: LOBECTOMY;  Surgeon: Melrose Nakayama, MD;  Location: [place];  Service: Thoracic;  Laterality: Left;  LEFT LOWER LOBECTOMY  . REFRACTIVE SURGERY     Bil  . SKIN BIOPSY     4 0r 5  .  SUPRACLAVICAL NODE BIOPSY Left 10/15/2014   Procedure: LEFT SUPRACLAVICAL LYMPH NODE BIOPSY;  Surgeon: Melrose Nakayama, MD;  Location: [place];  Service: Thoracic;  Laterality: Left;  LEFT SUPRACLAVICAL LYMPH NODE BIOPSY  . VASECTOMY    . VEIN SURGERY     R ankle    Family History  Problem Relation Age of Onset  . Stroke Mother     in 91s  . Hypertension Mother   . Breast cancer Mother   . Neuropathy Mother   . Melanoma Father   . Heart attack Father 36    stents  . Dementia Father   . Hypertension Sister   . Thyroid disease Sister     ??  . Multiple myeloma Sister   . Cancer Sister   . Diabetes Paternal Aunt   . Stomach cancer Maternal Grandfather   . Lung cancer Maternal Grandfather   . Cancer Paternal Grandmother     lung, nonsmoker  . Headache Daughter     Allergies  Allergen Reactions  . Lisinopril     cough     Current Outpatient Prescriptions on File Prior to Visit  Medication Sig Dispense Refill  . CARTIA XT 240 MG 24 hr capsule TAKE 1 CAPSULE EVERY DAY 90 capsule 0  . losartan (COZAAR) 100 MG tablet TAKE 1/2 TABLET (50 MG) EVERY DAY 45 tablet 3  . metoprolol tartrate (LOPRESSOR) 25 MG tablet TAKE 1 TABLET TWICE DAILY 180 tablet 0   No current facility-administered medications on file prior to visit.     BP (!) 163/94 (BP Location: Left Arm, Patient Position: Sitting, Cuff Size: Large)   Pulse 82   Temp 98.3 F (36.8 C) (Oral)   Resp 16   Ht 5' 11"  (1.803 m)   Wt 226 lb 6 oz (102.7 kg)   SpO2 100%   BMI 31.57 kg/m       Objective:   Physical Exam  General  Mental Status - Alert. General Appearance - Well groomed. Not in acute distress.  Skin Rashes- No Rashes.  HEENT Head- Normal. Ear Auditory Canal - Left- Normal. Right - Normal.Tympanic Membrane- Left- Normal. Right- cerumen impaction rt side but post lavage 99% of wax removed and states can hear. On small dry flake of wax left. Eye Sclera/Conjunctiva- Left- Normal. Right- Normal. Nose & Sinuses Nasal Mucosa- Left- Not  Boggy and Congested. Right- Not  Boggy and  Congested.Bilateral no  maxillary and  no frontal sinus pressure. Mouth & Throat Lips: Upper Lip- Normal: no dryness, cracking, pallor, cyanosis, or vesicular eruption. Lower Lip-Normal: no dryness, cracking, pallor, cyanosis or vesicular eruption. Buccal Mucosa- Bilateral- No Aphthous ulcers. Oropharynx- No Discharge or Erythema. Tonsils: Characteristics- Bilateral- No Erythema or Congestion. Size/Enlargement- Bilateral- No enlargement. Discharge- bilateral-None.  Neck Neck- Supple. No Masses.   Chest and Lung Exam Auscultation: Breath Sounds:-Clear even and unlabored.  Cardiovascular Auscultation:Rythm- Regular, rate and rhythm. Murmurs & Other Heart Sounds:Ausculatation of the heart reveal- No Murmurs.  Lymphatic Head & Neck General Head & Neck  Lymphatics: Bilateral: Description- No Localized lymphadenopathy.       Assessment & Plan:  You had successful lavage. You had 99% clearance of wax. If you get recurrence in future can use debrox before coming in for lavage. Follow up as needed.

## 2016-09-24 NOTE — Patient Instructions (Signed)
You had successful lavage. You had 99% clearance of wax. If you get recurrence in future can use debrox before coming in for lavage. Follow up as needed.

## 2016-09-24 NOTE — Progress Notes (Signed)
Pre visit review using our clinic review tool, if applicable. No additional management support is needed unless otherwise documented below in the visit note/SLS  

## 2016-11-12 ENCOUNTER — Other Ambulatory Visit: Payer: Self-pay | Admitting: Medical Oncology

## 2016-11-12 ENCOUNTER — Other Ambulatory Visit (HOSPITAL_BASED_OUTPATIENT_CLINIC_OR_DEPARTMENT_OTHER): Payer: Medicare HMO

## 2016-11-12 ENCOUNTER — Ambulatory Visit (HOSPITAL_COMMUNITY)
Admission: RE | Admit: 2016-11-12 | Discharge: 2016-11-12 | Disposition: A | Discharge status: Home / Self Care | Payer: Medicare HMO | Source: Ambulatory Visit | Attending: Internal Medicine | Admitting: Internal Medicine

## 2016-11-12 ENCOUNTER — Encounter (HOSPITAL_COMMUNITY): Payer: Self-pay

## 2016-11-12 DIAGNOSIS — I700 Atherosclerosis of aorta: Secondary | ICD-10-CM | POA: Diagnosis not present

## 2016-11-12 DIAGNOSIS — C3432 Malignant neoplasm of lower lobe, left bronchus or lung: Secondary | ICD-10-CM | POA: Diagnosis present

## 2016-11-12 DIAGNOSIS — C8300 Small cell B-cell lymphoma, unspecified site: Secondary | ICD-10-CM | POA: Diagnosis not present

## 2016-11-12 DIAGNOSIS — C8308 Small cell B-cell lymphoma, lymph nodes of multiple sites: Secondary | ICD-10-CM

## 2016-11-12 DIAGNOSIS — R911 Solitary pulmonary nodule: Secondary | ICD-10-CM | POA: Insufficient documentation

## 2016-11-12 DIAGNOSIS — Z902 Acquired absence of lung [part of]: Secondary | ICD-10-CM | POA: Diagnosis not present

## 2016-11-12 DIAGNOSIS — Z08 Encounter for follow-up examination after completed treatment for malignant neoplasm: Secondary | ICD-10-CM | POA: Insufficient documentation

## 2016-11-12 DIAGNOSIS — Z85118 Personal history of other malignant neoplasm of bronchus and lung: Secondary | ICD-10-CM | POA: Insufficient documentation

## 2016-11-12 DIAGNOSIS — K5730 Diverticulosis of large intestine without perforation or abscess without bleeding: Secondary | ICD-10-CM | POA: Diagnosis not present

## 2016-11-12 DIAGNOSIS — I2510 Atherosclerotic heart disease of native coronary artery without angina pectoris: Secondary | ICD-10-CM | POA: Diagnosis not present

## 2016-11-12 DIAGNOSIS — C3490 Malignant neoplasm of unspecified part of unspecified bronchus or lung: Secondary | ICD-10-CM | POA: Diagnosis not present

## 2016-11-12 LAB — LACTATE DEHYDROGENASE: LDH: 160 U/L (ref 125–245)

## 2016-11-12 LAB — CBC WITH DIFFERENTIAL/PLATELET
BASO%: 0.6 % (ref 0.0–2.0)
Basophils Absolute: 0 10*3/uL (ref 0.0–0.1)
EOS%: 1 % (ref 0.0–7.0)
Eosinophils Absolute: 0.1 10*3/uL (ref 0.0–0.5)
HCT: 42.6 % (ref 38.4–49.9)
HGB: 14.6 g/dL (ref 13.0–17.1)
LYMPH#: 2.4 10*3/uL (ref 0.9–3.3)
LYMPH%: 33.8 % (ref 14.0–49.0)
MCH: 33.3 pg (ref 27.2–33.4)
MCHC: 34.3 g/dL (ref 32.0–36.0)
MCV: 96.9 fL (ref 79.3–98.0)
MONO#: 0.7 10*3/uL (ref 0.1–0.9)
MONO%: 10.1 % (ref 0.0–14.0)
NEUT%: 54.5 % (ref 39.0–75.0)
NEUTROS ABS: 3.9 10*3/uL (ref 1.5–6.5)
Platelets: 164 10*3/uL (ref 140–400)
RBC: 4.39 10*6/uL (ref 4.20–5.82)
RDW: 12.7 % (ref 11.0–14.6)
WBC: 7.1 10*3/uL (ref 4.0–10.3)

## 2016-11-12 LAB — COMPREHENSIVE METABOLIC PANEL
ALBUMIN: 4.2 g/dL (ref 3.5–5.0)
ALK PHOS: 46 U/L (ref 40–150)
ALT: 36 U/L (ref 0–55)
AST: 30 U/L (ref 5–34)
Anion Gap: 11 mEq/L (ref 3–11)
BUN: 19.4 mg/dL (ref 7.0–26.0)
CO2: 26 mEq/L (ref 22–29)
Calcium: 10 mg/dL (ref 8.4–10.4)
Chloride: 102 mEq/L (ref 98–109)
Creatinine: 1.3 mg/dL (ref 0.7–1.3)
EGFR: 54 mL/min/{1.73_m2} — ABNORMAL LOW (ref >90)
GLUCOSE: 113 mg/dL (ref 70–140)
POTASSIUM: 4.3 meq/L (ref 3.5–5.1)
SODIUM: 139 meq/L (ref 136–145)
TOTAL PROTEIN: 7.6 g/dL (ref 6.4–8.3)
Total Bilirubin: 1.02 mg/dL (ref 0.20–1.20)

## 2016-11-12 MED ORDER — IOPAMIDOL (ISOVUE-300) INJECTION 61%
INTRAVENOUS | Status: AC
  Administered 2016-11-12: 100 mL
  Filled 2016-11-12: qty 100

## 2016-11-15 ENCOUNTER — Encounter: Payer: Self-pay | Admitting: Internal Medicine

## 2016-11-15 ENCOUNTER — Telehealth: Payer: Self-pay | Admitting: Internal Medicine

## 2016-11-15 ENCOUNTER — Ambulatory Visit (HOSPITAL_BASED_OUTPATIENT_CLINIC_OR_DEPARTMENT_OTHER): Payer: Medicare HMO | Admitting: Internal Medicine

## 2016-11-15 VITALS — BP 176/98 | HR 78 | Temp 98.7°F | Resp 16 | Ht 71.0 in | Wt 226.6 lb

## 2016-11-15 DIAGNOSIS — C8300 Small cell B-cell lymphoma, unspecified site: Secondary | ICD-10-CM | POA: Diagnosis not present

## 2016-11-15 DIAGNOSIS — C3432 Malignant neoplasm of lower lobe, left bronchus or lung: Secondary | ICD-10-CM | POA: Diagnosis not present

## 2016-11-15 DIAGNOSIS — I10 Essential (primary) hypertension: Secondary | ICD-10-CM

## 2016-11-15 NOTE — Progress Notes (Signed)
[facility] [phone]   Fax:([phone]) [place], MD [address] [place] [address]  DIAGNOSIS AND STAGE:  1) small lymphocytic lymphoma involving the left supraclavicular lymph nodes diagnosed in February 2016. 2) Stage IIA (T1a, N1, M0) non-small cell lung cancer consistent with adenocarcinoma with negative EGFR mutation diagnosed in March of 2013.   PRIOR THERAPY:  1) S/P left video-assisted thoracoscopy, wedge resection of left lower lobe nodule, thoracoscopic left lower lobectomy, mediastinal lymph node dissection on 11/02/2011.  2) Adjuvant chemotherapy with cisplatin 75 mg/M2 and Alimta 500 mg/M2 every 3 weeks. She is status post 4 cycles.   CURRENT THERAPY: Observation.  CHEMOTHERAPY INTENT: Curative/adjuvant  CURRENT # OF CHEMOTHERAPY CYCLES: 0  CURRENT ANTIEMETICS: Compazine  CURRENT SMOKING STATUS: Currently nonsmoker  ORAL CHEMOTHERAPY AND CONSENT: None  CURRENT BISPHOSPHONATES USE: None  PAIN MANAGEMENT: 0/10  NARCOTICS INDUCED CONSTIPATION: N/A  LIVING WILL AND CODE STATUS: Full code initially. INTERVAL HISTORY: Kyle Long 71 y.o. male returns to the clinic today for follow-up visit accompanied by his wife. The patient is feeling fine today with no specific complaints. He denied having any chest pain, shortness of breath, cough or hemoptysis. He has no fever or chills. He denied having any nausea, vomiting, diarrhea or constipation. He is anxious about his scan results and his blood pressure is elevated but usually normal at home. He had repeat CT scan of the chest, abdomen and pelvis performed recently and he is here for evaluation and discussion of his scan results.   MEDICAL HISTORY: Past Medical History:  Diagnosis Date  . Allergic state 06/26/2015  . Amnesia 2009   isolated;for 30 mins w/elevated BP  . Arthritis   . Cancer ([facility])    skin / top head  .  Cellulitis 2011   LUE (no PMH of MRSA)/left elbow  . Diverticulosis   . Diverticulosis of colon   . GERD (gastroesophageal reflux disease)    OCC HEARTBURN  . Gilbert's syndrome   . Hematuria 2009   Dr Burnell Blanks  . Hyperglycemia   . Hyperlipidemia   . Hypertension   . Lung cancer, lower lobe (H[facility])    Stage IIA (T1,N1)  . Neuromuscular disorder ([facility])    2010 TEMPORARY MEMORY LOSS 1-1.5 HR WAS CHECKED OUT AT [facility] , NOTHING FOUND  . Peptic ulcer 1995  . Peyronie disease   . Preventative health care 12/19/2015  . Primary cancer of left lower lobe of lung ([facility]) 11/27/2011  . Recurrent upper respiratory infection (URI)    09/2011  BRONCHITIS    ALLERGIES:  is allergic to lisinopril.  MEDICATIONS:  Current Outpatient Prescriptions  Medication Sig Dispense Refill  . CARTIA XT 240 MG 24 hr capsule TAKE 1 CAPSULE EVERY DAY 90 capsule 0  . losartan (COZAAR) 100 MG tablet TAKE 1/2 TABLET (50 MG) EVERY DAY 45 tablet 3  . metoprolol tartrate (LOPRESSOR) 25 MG tablet TAKE 1 TABLET TWICE DAILY 180 tablet 0   No current facility-administered medications for this visit.     SURGICAL HISTORY:  Past Surgical History:  Procedure Laterality Date  . chemo treatment      4 treatments that last 7 hours each/last treatmebt in 2013  . COLONOSCOPY W/ POLYPECTOMY  2005   due 2015  . CYSTOSCOPY  2009  . LOBECTOMY  11/02/2011   Procedure: LOBECTOMY;  Surgeon: Melrose Nakayama, MD;  Location: [facility];  Service: Thoracic;  Laterality: Left;  LEFT LOWER LOBECTOMY  . REFRACTIVE SURGERY     Bil  . SKIN BIOPSY     4 0r 5  . SUPRACLAVICAL NODE BIOPSY Left 10/15/2014   Procedure: LEFT SUPRACLAVICAL LYMPH NODE BIOPSY;  Surgeon: Melrose Nakayama, MD;  Location: [facility];  Service: Thoracic;  Laterality: Left;  LEFT SUPRACLAVICAL LYMPH NODE BIOPSY  . VASECTOMY    . VEIN SURGERY     R ankle    REVIEW OF SYSTEMS:  Constitutional: negative Eyes: negative Ears, nose, mouth, throat, and face:  negative Respiratory: negative Cardiovascular: negative Gastrointestinal: negative Genitourinary:negative Integument/breast: negative Hematologic/lymphatic: negative Musculoskeletal:negative Neurological: negative Behavioral/Psych: positive for anxiety Endocrine: negative Allergic/Immunologic: negative   PHYSICAL EXAMINATION: General appearance: alert, cooperative and no distress Head: Normocephalic, without obvious abnormality, atraumatic Neck: no adenopathy, no JVD, supple, symmetrical, trachea midline and thyroid not enlarged, symmetric, no tenderness/mass/nodules Lymph nodes: Cervical, supraclavicular, and axillary nodes normal. Resp: clear to auscultation bilaterally Back: symmetric, no curvature. ROM normal. No CVA tenderness. Cardio: regular rate and rhythm, S1, S2 normal, no murmur, click, rub or gallop GI: soft, non-tender; bowel sounds normal; no masses,  no organomegaly Extremities: extremities normal, atraumatic, no cyanosis or edema Neurologic: Alert and oriented X 3, normal strength and tone. Normal symmetric reflexes. Normal coordination and gait  ECOG PERFORMANCE STATUS: 1 - Symptomatic but completely ambulatory  Blood pressure (!) 176/98, pulse 78, temperature 98.7 F (37.1 C), resp. rate 16, height 5' 11"  (1.803 m), weight 226 lb 9.6 oz (102.8 kg), SpO2 100 %.  LABORATORY DATA: Lab Results  Component Value Date   WBC 7.1 11/12/2016   HGB 14.6 11/12/2016   HCT 42.6 11/12/2016   MCV 96.9 11/12/2016   PLT 164 11/12/2016      Chemistry      Component Value Date/Time   NA 139 11/12/2016 1012   K 4.3 11/12/2016 1012   CL 102 12/02/2014 1047   CL 104 09/03/2012 1003   CO2 26 11/12/2016 1012   BUN 19.4 11/12/2016 1012   CREATININE 1.3 11/12/2016 1012      Component Value Date/Time   CALCIUM 10.0 11/12/2016 1012   ALKPHOS 46 11/12/2016 1012   AST 30 11/12/2016 1012   ALT 36 11/12/2016 1012   BILITOT 1.02 11/12/2016 1012       RADIOGRAPHIC  STUDIES: Ct Chest W Contrast  Result Date: 11/12/2016 CLINICAL DATA:  Restaging lung cancer diagnosed 2013. History of lymphoma diagnosed in 2016. EXAM: CT CHEST, ABDOMEN, AND PELVIS WITH CONTRAST TECHNIQUE: Multidetector CT imaging of the chest, abdomen and pelvis was performed following the standard protocol during bolus administration of intravenous contrast. CONTRAST:  123m ISOVUE-300 IOPAMIDOL (ISOVUE-300) INJECTION 61% COMPARISON:  None. FINDINGS: CT CHEST FINDINGS Cardiovascular: The heart size is normal. No pericardial effusion. Coronary artery calcification is noted. Atherosclerotic calcification is noted in the wall of the thoracic aorta. Mediastinum/Nodes: No mediastinal lymphadenopathy. There is no hilar lymphadenopathy. The esophagus has normal imaging features. There is no axillary lymphadenopathy. Lungs/Pleura: 7 mm right upper lobe pulmonary nodule (image 60 series 4) is new in the interval. The 8 mm right upper lobe nodule seen on prior study is unchanged. 4 mm perifissural nodule seen on the minor fissure (image 96 series 4) is unchanged. Patient is status post left lower lobectomy. Musculoskeletal: Bone windows reveal no worrisome lytic or sclerotic osseous lesions. CT ABDOMEN PELVIS FINDINGS Hepatobiliary: Multiple low-density liver lesions again noted. Dominant lesion is lateral segment left liver measuring 2.3 cm  today compared to 2.4 cm previously. The liver shows diffusely decreased attenuation suggesting steatosis. There is no evidence for gallstones, gallbladder wall thickening, or pericholecystic fluid. No intrahepatic or extrahepatic biliary dilation. Pancreas: No focal mass lesion. No dilatation of the main duct. No intraparenchymal cyst. No peripancreatic edema. Spleen: No splenomegaly. No focal mass lesion. Adrenals/Urinary Tract: No adrenal nodule or mass. Kidneys unremarkable. No evidence for hydroureter. The urinary bladder appears normal for the degree of distention.  Stomach/Bowel: Tiny fundal diverticulum noted in the stomach. Duodenal diverticulum evident. No small bowel wall thickening. No small bowel dilatation. The terminal ileum is normal. The appendix is normal. Diverticuli are seen scattered along the entire length of the colon without CT findings of diverticulitis. Vascular/Lymphatic: There is abdominal aortic atherosclerosis without aneurysm. Small lymph nodes in the retroperitoneal space have decreased in size in the interval. 11 mm short axis aortocaval lymph node measured on the prior study is now 6 mm short axis (image 76 series 2). 16 mm short axis mesenteric lymph node measured on the prior study is now 6 mm short axis on image 73 series 2. 10 mm short axis right common iliac lymph node on today's exam was 14 mm remeasured on prior. Reproductive: Prostate gland stable. Other: No intraperitoneal free fluid. Musculoskeletal: Bone windows reveal no worrisome lytic or sclerotic osseous lesions. IMPRESSION: 1. Status post left lower lobectomy. Nodule seen on prior study are stable, but there is a new 7 mm central right upper lobe pulmonary nodule on today's study. Close follow-up recommended and repeat CT chest in 3 months may be helpful to further evaluate given the clinical history in this patient. 2. Stable nonenlarged lymph nodes in the mediastinum assisted with interval decreased size of mesenteric, retroperitoneal, and pelvic sidewall lymph nodes. 3.  Abdominal Aortic Atherosclerois (ICD10-170.0) 4. Colonic diverticulosis without diverticulitis. Electronically Signed   By: Misty Stanley M.D.   On: 11/12/2016 14:57   Ct Abdomen Pelvis W Contrast  Result Date: 11/12/2016 CLINICAL DATA:  Restaging lung cancer diagnosed 2013. History of lymphoma diagnosed in 2016. EXAM: CT CHEST, ABDOMEN, AND PELVIS WITH CONTRAST TECHNIQUE: Multidetector CT imaging of the chest, abdomen and pelvis was performed following the standard protocol during bolus administration of  intravenous contrast. CONTRAST:  148m ISOVUE-300 IOPAMIDOL (ISOVUE-300) INJECTION 61% COMPARISON:  None. FINDINGS: CT CHEST FINDINGS Cardiovascular: The heart size is normal. No pericardial effusion. Coronary artery calcification is noted. Atherosclerotic calcification is noted in the wall of the thoracic aorta. Mediastinum/Nodes: No mediastinal lymphadenopathy. There is no hilar lymphadenopathy. The esophagus has normal imaging features. There is no axillary lymphadenopathy. Lungs/Pleura: 7 mm right upper lobe pulmonary nodule (image 60 series 4) is new in the interval. The 8 mm right upper lobe nodule seen on prior study is unchanged. 4 mm perifissural nodule seen on the minor fissure (image 96 series 4) is unchanged. Patient is status post left lower lobectomy. Musculoskeletal: Bone windows reveal no worrisome lytic or sclerotic osseous lesions. CT ABDOMEN PELVIS FINDINGS Hepatobiliary: Multiple low-density liver lesions again noted. Dominant lesion is lateral segment left liver measuring 2.3 cm today compared to 2.4 cm previously. The liver shows diffusely decreased attenuation suggesting steatosis. There is no evidence for gallstones, gallbladder wall thickening, or pericholecystic fluid. No intrahepatic or extrahepatic biliary dilation. Pancreas: No focal mass lesion. No dilatation of the main duct. No intraparenchymal cyst. No peripancreatic edema. Spleen: No splenomegaly. No focal mass lesion. Adrenals/Urinary Tract: No adrenal nodule or mass. Kidneys unremarkable. No evidence for hydroureter. The urinary  bladder appears normal for the degree of distention. Stomach/Bowel: Tiny fundal diverticulum noted in the stomach. Duodenal diverticulum evident. No small bowel wall thickening. No small bowel dilatation. The terminal ileum is normal. The appendix is normal. Diverticuli are seen scattered along the entire length of the colon without CT findings of diverticulitis. Vascular/Lymphatic: There is abdominal  aortic atherosclerosis without aneurysm. Small lymph nodes in the retroperitoneal space have decreased in size in the interval. 11 mm short axis aortocaval lymph node measured on the prior study is now 6 mm short axis (image 76 series 2). 16 mm short axis mesenteric lymph node measured on the prior study is now 6 mm short axis on image 73 series 2. 10 mm short axis right common iliac lymph node on today's exam was 14 mm remeasured on prior. Reproductive: Prostate gland stable. Other: No intraperitoneal free fluid. Musculoskeletal: Bone windows reveal no worrisome lytic or sclerotic osseous lesions. IMPRESSION: 1. Status post left lower lobectomy. Nodule seen on prior study are stable, but there is a new 7 mm central right upper lobe pulmonary nodule on today's study. Close follow-up recommended and repeat CT chest in 3 months may be helpful to further evaluate given the clinical history in this patient. 2. Stable nonenlarged lymph nodes in the mediastinum assisted with interval decreased size of mesenteric, retroperitoneal, and pelvic sidewall lymph nodes. 3.  Abdominal Aortic Atherosclerois (ICD10-170.0) 4. Colonic diverticulosis without diverticulitis. Electronically Signed   By: Misty Stanley M.D.   On: 11/12/2016 14:57    ASSESSMENT AND PLAN: This is a very pleasant 71 years old white male with: 1) stage II a non-small cell lung cancer, adenocarcinoma status post wedge resection of the left lower lobe with lymph node dissection in April 2013 followed by adjuvant systemic chemotherapy with cisplatin and Alimta. He has been observation since that time. His recent CT scan of the chest showed no clear evidence for disease recurrence except for new 7 mm central right upper lobe pulmonary nodule. I personally and independently reviewed the scan images and discuss the results and showed the images to the patient and his wife today. I recommended for the patient to continue on observation but we will repeat CT  scan of the chest in 3 months for reevaluation of this pulmonary nodule. 2) small lymphocytic lymphoma: Stable on the recent CT scan of the chest, abdomen and pelvis. He will continue on observation for now. 3) hypertension: The patient was advised to monitor his blood pressure closely and to consult with his primary care physician if no improvement. He was advised to call immediately if he has any concerning symptoms in the interval. The patient voices understanding of current disease status and treatment options and is in agreement with the current care plan. All questions were answered. The patient knows to call the clinic with any problems, questions or concerns. We can certainly see the patient much sooner if necessary.  Disclaimer: This note was dictated with voice recognition software. Similar sounding words can inadvertently be transcribed and may not be corrected upon review.

## 2016-11-15 NOTE — Telephone Encounter (Signed)
Appointments scheduled per 4.19.18 LOS. Patient given AVS report and calendars with future scheduled appointments.

## 2016-11-16 ENCOUNTER — Ambulatory Visit (INDEPENDENT_AMBULATORY_CARE_PROVIDER_SITE_OTHER): Payer: Medicare HMO | Admitting: *Deleted

## 2016-11-16 ENCOUNTER — Telehealth: Payer: Self-pay | Admitting: Medical

## 2016-11-16 ENCOUNTER — Encounter: Payer: Self-pay | Admitting: *Deleted

## 2016-11-16 VITALS — BP 150/90 | HR 74 | Ht 70.0 in | Wt 225.0 lb

## 2016-11-16 DIAGNOSIS — Z0000 Encounter for general adult medical examination without abnormal findings: Secondary | ICD-10-CM | POA: Diagnosis not present

## 2016-11-16 NOTE — Patient Instructions (Addendum)
  Kyle Long , Thank you for taking time to come for your Medicare Wellness Visit. I appreciate your ongoing commitment to your health goals. Please review the following plan we discussed and let me know if I can assist you in the future.   Bring a copy of your advance directives to your next office visit.  Eat heart healthy diet (full of fruits, vegetables, whole grains, lean protein, water--limit salt, fat, and sugar intake) and increase physical activity as tolerated.  Continue to check your blood pressure at home and bring your log to your next appointment.  These are the goals we discussed: Goals    . Eat more fruits and vegetables    . Weight (lb) < 215 lb (97.5 kg)       This is a list of the screening recommended for you and due dates:  Health Maintenance  Topic Date Due  .  Hepatitis C: One time screening is recommended by Center for Disease Control  (CDC) for  adults born from [birth year] through 1967.   11/16/2017*  . Flu Shot  02/27/2017  . Colon Cancer Screening  10/27/2018  . Tetanus Vaccine  09/04/2022  . Pneumonia vaccines  Completed  *Topic was postponed. The date shown is not the original due date.

## 2016-11-16 NOTE — Progress Notes (Addendum)
Subjective:   Kyle Long is a 71 y.o. male who presents for Medicare Annual/Subsequent preventive examination.  He is overall doing well since last visit. He did lose some weight last year and got down to about 219 lbs. Over the winter and through football season, he gained back most of it. He is looking forward to the nicer weather and getting back on track with diet and exercise.  He checks his BP at home and at the gym after working out. Averaging 130s-150s at home.  Review of Systems:  No ROS.  Medicare Wellness Visit.  Cardiac Risk Factors include: advanced age (>55mn, >>1women);hypertension;obesity (BMI >30kg/m2);male gender  Sleep patterns: Sleeps at least 7-8 hours per night/wakes up some nights 2-3 times per night to void. Goes to bed at 9pm and get up at 7am. Has restless sleep-wakes frequently and goes back to sleep. Snores sometimes.  Home Safety/Smoke Alarms: Lives w/ wife. Feels safe at home. Lives in with wife in 2 story home with a downstairs basement. Smokes alarms and security system present. No issues navigating stairs.   Seat Belt Safety/Bike Helmet: Wears seat belt.    Counseling:   Eye Exam- Dr. SMarica Otteryearly. Dental-[place] annually.  [facility] [place]    [place]:   CCS- last 10/26/13- 2 polyps-repeat in 5 years (2020), Dr. PHilarie Fredrickson  PSA-  Lab Results  Component Value Date   PSA 1.86 09/04/2012   PSA 1.39 10/10/2011   PSA 1.64 02/01/2010   Assessment from previous AWV on 11/15/15 reviewed with patient and updated accordingly. Information above is accurate as of 11/16/16.     Objective:    Vitals: BP (!) 150/90   Pulse 74   Ht 5' 10"  (1.778 m)   Wt 225 lb (102.1 kg)   SpO2 98%   BMI 32.28 kg/m   Body mass index is 32.28 kg/m.  Wt Readings from Last 3 Encounters:  11/16/16 225 lb (102.1 kg)  11/15/16 226 lb 9.6 oz (102.8 kg)  09/24/16 226 lb 6 oz (102.7 kg)   Tobacco History  Smoking Status  . Former Smoker  . Packs/day:  1.00  . Years: 15.00  . Quit date: 07/31/1979  Smokeless Tobacco  . Never Used     Counseling given: Not Answered   Past Medical History:  Diagnosis Date  . Allergic state 06/26/2015  . Amnesia 2009   isolated;for 30 mins w/elevated BP  . Arthritis   . [place]    skin / top head  . Cellulitis 2011   LUE (no PMH of MRSA)/left elbow  . Diverticulosis   . Diverticulosis of colon   . GERD (gastroesophageal reflux disease)    OCC HEARTBURN  . Gilbert's syndrome   . Hematuria 2009   Dr PBurnell Blanks . Hyperglycemia   . Hyperlipidemia   . Hypertension   . Lung cancer, lower lobe (HCC)    Stage IIA (T1,N1)  . [place]    2010 TEMPORARY MEMORY LOSS 1-1.5 HR WAS CHECKED OUT AT [facility] , NOTHING FOUND  . Peptic ulcer 1995  . Peyronie disease   . Preventative health care 12/19/2015  . [place] of [place] lobe of lung [place] 11/27/2011  . Recurrent upper respiratory infection (URI)    09/2011  BRONCHITIS   Past Surgical History:  Procedure Laterality Date  . chemo treatment      4 treatments that last 7 hours each/last treatmebt in 2013  . COLONOSCOPY  W/ POLYPECTOMY  2005   due 2015  . CYSTOSCOPY  2009  . LOBECTOMY  11/02/2011   Procedure: LOBECTOMY;  Surgeon: Melrose Nakayama, MD;  Location: [facility];  Service: Thoracic;  Laterality: Left;  LEFT LOWER LOBECTOMY  . REFRACTIVE SURGERY     Bil  . SKIN BIOPSY     4 0r 5  . SUPRACLAVICAL NODE BIOPSY Left 10/15/2014   Procedure: LEFT SUPRACLAVICAL LYMPH NODE BIOPSY;  Surgeon: Melrose Nakayama, MD;  Location: [place];  Service: Thoracic;  Laterality: Left;  LEFT SUPRACLAVICAL LYMPH NODE BIOPSY  . VASECTOMY    . VEIN SURGERY     R ankle   Family History  Problem Relation Age of Onset  . Stroke Mother     in 20s  . Hypertension Mother   . Breast cancer Mother   . Neuropathy Mother   . Melanoma Father   . Heart attack Father 81    stents  . Dementia Father   . Hypertension Sister   .  Thyroid disease Sister     ??  . Multiple myeloma Sister   . Cancer Sister   . Stomach cancer Maternal Grandfather   . Lung cancer Maternal Grandfather   . Cancer Paternal Grandmother     lung, nonsmoker  . Headache Daughter   . Diabetes Paternal Aunt    History  Sexual Activity  . 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    Outpatient Encounter Prescriptions as of 11/16/2016  Medication Sig  . CARTIA XT 240 MG 24 hr capsule TAKE 1 CAPSULE EVERY DAY  . losartan (COZAAR) 100 MG tablet TAKE 1/2 TABLET (50 MG) EVERY DAY  . metoprolol tartrate (LOPRESSOR) 25 MG tablet TAKE 1 TABLET TWICE DAILY   No facility-administered encounter medications on file as of 11/16/2016.     Activities of Daily Living In your present state of health, do you have any difficulty performing the following activities: 11/16/2016  Hearing? N  Vision? N  Difficulty concentrating or making decisions? N  Walking or climbing stairs? N  Dressing or bathing? N  Doing errands, shopping? N  Preparing Food and eating ? N  Using the Toilet? N  In the past six months, have you accidently leaked urine? N  Do you have problems with loss of bowel control? N  Managing your Medications? N  Managing your Finances? N  Housekeeping or managing your Housekeeping? N  Some recent data might be hidden    Patient Care Team: [name], MD as PCP - General (Family Medicine) Linde Gillis, MD as Referring Physician (Nephrology) Curt Bears, MD as Consulting Physician (Oncology) Geri Seminole, MD as Referring Physician (Dermatopathology) Marica Otter, OD as Consulting Physician (Optometry)    Assessment:    Physical assessment deferred to PCP.  Exercise Activities and Dietary recommendations Current Exercise Habits: Structured exercise class, Type of exercise: treadmill;strength training/weights;Other - see comments  (golfs 1 day/week), Frequency (Times/Week): 2  Diet (meal preparation, eat out, water intake, caffeinated beverages, dairy products, fruits and vegetables): well balanced, on average, 2-3 meals per day. Regular diet. Reports diet was okay until football season started and the weather got cold. Drinks water throughout the day. Does eat enough fruits/vegetables but enjoys fruit and brussel sprouts. Meals vary-sometimes eats at home and sometimes eats out. Some days may have a large breakfast, other days just toast w/ PB or something  light.  Goals    . Eat more fruits and vegetables    . Weight (lb) < 215 lb (97.5 kg)      Fall Risk Fall Risk  11/16/2016 11/15/2015 08/05/2014 03/16/2014 06/15/2013  Falls in the past year? No No No No No   Depression Screen PHQ 2/9 Scores 11/16/2016 11/15/2015 08/05/2014 06/15/2013  PHQ - 2 Score 0 0 0 0    Cognitive Function MMSE - Mini Mental State Exam 11/16/2016 11/15/2015  Orientation to time 5 5  Orientation to Place 5 5  Registration 3 3  Attention/ Calculation 5 5  Recall 3 3  Language- name 2 objects 2 2  Language- repeat 1 1  Language- follow 3 step command 3 3  Language- read & follow direction 1 1  Write a sentence 1 1  Copy design 1 1  Total score 30 30        Immunization History  Administered Date(s) Administered  . Influenza,inj,Quad PF,36+ Mos 06/17/2015  . Pneumococcal Conjugate-13 12/13/2014  . Pneumococcal Polysaccharide-23 02/01/2010  . Td 09/27/2001  . Tdap 09/04/2012   Screening Tests Health Maintenance  Topic Date Due  . Hepatitis C Screening  11/16/2017 (Originally [birth year])  . INFLUENZA VACCINE  02/27/2017  . COLONOSCOPY  10/27/2018  . TETANUS/TDAP  09/04/2022  . PNA vac Low Risk Adult  Completed      Plan:   Follow-up w/ PCP as scheduled.  Bring a copy of your advance directives to your next office visit.  Eat heart healthy diet (full of fruits, vegetables, whole grains, lean protein, water--limit salt, fat, and  sugar intake) and increase physical activity as tolerated.  During the course of the visit the patient was educated and counseled about the following appropriate screening and preventive services:   Vaccines to include Pneumococcal, Influenza, Td, HCV  Cardiovascular Disease  Colorectal cancer screening  Diabetes screening  Prostate Cancer Screening  Glaucoma screening  Nutrition counseling   Patient Instructions (the written plan) was given to the patient.    Dorrene German, RN  11/16/2016   I agree with assessment and plan of RN. I do want some future readings of pt bp. Last visit I saw his bp was elevated and at he noted some systolics 185 range. So will ask RN to call pt and get pt to record daily bp reading and then call us with those.  Saguier, Percell Miller, PA-C

## 2016-11-16 NOTE — Progress Notes (Signed)
Pre visit review using our clinic review tool, if applicable. No additional management support is needed unless otherwise documented below in the visit note. 

## 2016-11-16 NOTE — Telephone Encounter (Signed)
Will you call pt and ask him to check his bp daily for one week and then call in one week and give [place] readings. Last time I saw him his bp was high. On his exam with you he mentioned some systolics in 115 range. So would like to review those readings. Make sure controlled bp.

## 2016-11-19 ENCOUNTER — Telehealth: Payer: Self-pay | Admitting: Medical

## 2016-11-19 NOTE — Telephone Encounter (Signed)
That is fine. Thanks 

## 2016-11-19 NOTE — Telephone Encounter (Addendum)
He was instructed at time of visit to continue to check BP at home and bring log to next appointment w/ PCP, which is next month. Is that okay?

## 2016-11-20 ENCOUNTER — Other Ambulatory Visit: Payer: Self-pay | Admitting: Medical

## 2016-11-20 ENCOUNTER — Other Ambulatory Visit: Payer: Self-pay | Admitting: Family Medicine

## 2016-11-26 DIAGNOSIS — L814 Other melanin hyperpigmentation: Secondary | ICD-10-CM | POA: Diagnosis not present

## 2016-11-26 DIAGNOSIS — D492 Neoplasm of unspecified behavior of bone, soft tissue, and skin: Secondary | ICD-10-CM | POA: Diagnosis not present

## 2016-11-26 DIAGNOSIS — B353 Tinea pedis: Secondary | ICD-10-CM | POA: Diagnosis not present

## 2016-11-26 DIAGNOSIS — L570 Actinic keratosis: Secondary | ICD-10-CM | POA: Diagnosis not present

## 2016-11-26 DIAGNOSIS — Z85828 Personal history of other malignant neoplasm of skin: Secondary | ICD-10-CM | POA: Diagnosis not present

## 2016-11-26 DIAGNOSIS — L821 Other seborrheic keratosis: Secondary | ICD-10-CM | POA: Diagnosis not present

## 2016-11-26 DIAGNOSIS — C44529 Squamous cell carcinoma of skin of other part of trunk: Secondary | ICD-10-CM | POA: Diagnosis not present

## 2016-11-26 DIAGNOSIS — L579 Skin changes due to chronic exposure to nonionizing radiation, unspecified: Secondary | ICD-10-CM | POA: Diagnosis not present

## 2016-12-10 ENCOUNTER — Telehealth: Payer: Self-pay | Admitting: Family Medicine

## 2016-12-10 NOTE — Telephone Encounter (Signed)
Caller name:Kyle Long Relationship to patient:self Can be reached:[phone] Pharmacy:  Reason for call:DOS on 09/24/16, insurance([employer]) denied payment due to rendering and billing provider being different.

## 2016-12-13 DIAGNOSIS — C44529 Squamous cell carcinoma of skin of other part of trunk: Secondary | ICD-10-CM | POA: Diagnosis not present

## 2016-12-13 DIAGNOSIS — L905 Scar conditions and fibrosis of skin: Secondary | ICD-10-CM | POA: Diagnosis not present

## 2016-12-13 HISTORY — PX: OTHER SURGICAL HISTORY: SHX169

## 2016-12-18 ENCOUNTER — Encounter: Payer: Self-pay | Admitting: Family Medicine

## 2016-12-18 ENCOUNTER — Ambulatory Visit (INDEPENDENT_AMBULATORY_CARE_PROVIDER_SITE_OTHER): Payer: Medicare HMO | Admitting: Family Medicine

## 2016-12-18 VITALS — BP 153/73 | HR 72 | Temp 98.2°F | Ht 70.0 in | Wt 224.4 lb

## 2016-12-18 DIAGNOSIS — D045 Carcinoma in situ of skin of trunk: Secondary | ICD-10-CM

## 2016-12-18 DIAGNOSIS — N289 Disorder of kidney and ureter, unspecified: Secondary | ICD-10-CM

## 2016-12-18 DIAGNOSIS — Z0000 Encounter for general adult medical examination without abnormal findings: Secondary | ICD-10-CM

## 2016-12-18 DIAGNOSIS — E6609 Other obesity due to excess calories: Secondary | ICD-10-CM | POA: Diagnosis not present

## 2016-12-18 DIAGNOSIS — M109 Gout, unspecified: Secondary | ICD-10-CM

## 2016-12-18 DIAGNOSIS — E782 Mixed hyperlipidemia: Secondary | ICD-10-CM

## 2016-12-18 DIAGNOSIS — Z1159 Encounter for screening for other viral diseases: Secondary | ICD-10-CM

## 2016-12-18 DIAGNOSIS — I10 Essential (primary) hypertension: Secondary | ICD-10-CM

## 2016-12-18 DIAGNOSIS — C8590 Non-Hodgkin lymphoma, unspecified, unspecified site: Secondary | ICD-10-CM

## 2016-12-18 DIAGNOSIS — R739 Hyperglycemia, unspecified: Secondary | ICD-10-CM | POA: Diagnosis not present

## 2016-12-18 DIAGNOSIS — C4492 Squamous cell carcinoma of skin, unspecified: Secondary | ICD-10-CM

## 2016-12-18 LAB — CBC
HCT: 43.5 % (ref 39.0–52.0)
Hemoglobin: 14.9 g/dL (ref 13.0–17.0)
MCHC: 34.2 g/dL (ref 30.0–36.0)
MCV: 98.9 fl (ref 78.0–100.0)
PLATELETS: 202 10*3/uL (ref 150.0–400.0)
RBC: 4.4 Mil/uL (ref 4.22–5.81)
RDW: 13.3 % (ref 11.5–15.5)
WBC: 7.4 10*3/uL (ref 4.0–10.5)

## 2016-12-18 LAB — COMPREHENSIVE METABOLIC PANEL
ALT: 32 U/L (ref 0–53)
AST: 30 U/L (ref 0–37)
Albumin: 4.6 g/dL (ref 3.5–5.2)
Alkaline Phosphatase: 45 U/L (ref 39–117)
BILIRUBIN TOTAL: 1 mg/dL (ref 0.2–1.2)
BUN: 21 mg/dL (ref 6–23)
CALCIUM: 9.8 mg/dL (ref 8.4–10.5)
CHLORIDE: 103 meq/L (ref 96–112)
CO2: 25 meq/L (ref 19–32)
CREATININE: 1.27 mg/dL (ref 0.40–1.50)
GFR: 59.14 mL/min — ABNORMAL LOW (ref >60.00)
Glucose, Bld: 103 mg/dL — ABNORMAL HIGH (ref 70–99)
Potassium: 4.2 mEq/L (ref 3.5–5.1)
SODIUM: 138 meq/L (ref 135–145)
Total Protein: 7.6 g/dL (ref 6.0–8.3)

## 2016-12-18 LAB — LIPID PANEL
CHOL/HDL RATIO: 4
CHOLESTEROL: 187 mg/dL (ref 0–200)
HDL: 49.4 mg/dL (ref >39.00)
NONHDL: 137.17
Triglycerides: 239 mg/dL — ABNORMAL HIGH (ref 0.0–149.0)
VLDL: 47.8 mg/dL — AB (ref 0.0–40.0)

## 2016-12-18 LAB — HEMOGLOBIN A1C: HEMOGLOBIN A1C: 5.5 % (ref 4.6–6.5)

## 2016-12-18 LAB — TSH: TSH: 1.6 u[IU]/mL (ref 0.35–4.50)

## 2016-12-18 LAB — HEPATITIS C ANTIBODY: HCV Ab: NEGATIVE

## 2016-12-18 LAB — URIC ACID: URIC ACID, SERUM: 6.8 mg/dL (ref 4.0–7.8)

## 2016-12-18 LAB — LDL CHOLESTEROL, DIRECT: Direct LDL: 91 mg/dL

## 2016-12-18 MED ORDER — METOPROLOL TARTRATE 50 MG PO TABS: 50.0000 mg | ORAL_TABLET | Freq: Two times a day (BID) | ORAL | 1 refills | Status: DC

## 2016-12-18 MED ORDER — LOSARTAN POTASSIUM 100 MG PO TABS: ORAL_TABLET | ORAL | 3 refills | Status: DC

## 2016-12-18 MED ORDER — DILTIAZEM HCL ER COATED BEADS 240 MG PO CP24: 240.0000 mg | ORAL_CAPSULE | Freq: Every day | ORAL | 1 refills | Status: DC

## 2016-12-18 NOTE — Progress Notes (Signed)
Pre visit review using our clinic review tool, if applicable. No additional management support is needed unless otherwise documented below in the visit note. 

## 2016-12-18 NOTE — Assessment & Plan Note (Signed)
Mild and stable after having trouble several years ago during cancer treatments. Will continue to monitor and sees nephrology annually

## 2016-12-18 NOTE — Assessment & Plan Note (Signed)
hgba1c acceptable, minimize simple carbs. Increase exercise as tolerated.  

## 2016-12-18 NOTE — Assessment & Plan Note (Signed)
Encouraged DASH diet, decrease po intake and increase exercise as tolerated. Needs 7-8 hours of sleep nightly. Avoid trans fats, eat small, frequent meals every 4-5 hours with lean proteins, complex carbs and healthy fats. Minimize simple carbs 

## 2016-12-18 NOTE — Assessment & Plan Note (Signed)
Encouraged heart healthy diet, increase exercise, avoid trans fats, consider a krill oil cap daily 

## 2016-12-18 NOTE — Assessment & Plan Note (Signed)
Recently excised from anterior chest wall by Dr Raliegh Ip and is healing well.

## 2016-12-18 NOTE — Assessment & Plan Note (Signed)
Check uric acid today 

## 2016-12-18 NOTE — Patient Instructions (Addendum)
Thank you for bringing in a copy of your Healthcare Power of Attorney & Living Will paperwork to be scanned into your Chart. Consider the MIND Diet. Preventive Care 73 Years and Older, Male Preventive care refers to lifestyle choices and visits with your health care provider that can promote health and wellness. What does preventive care include?  A yearly physical exam. This is also called an annual well check.  Dental exams once or twice a year.  Routine eye exams. Ask your health care provider how often you should have your eyes checked.  Personal lifestyle choices, including:  Daily care of your teeth and gums.  Regular physical activity.  Eating a healthy diet.  Avoiding tobacco and drug use.  Limiting alcohol use.  Practicing safe sex.  Taking low doses of aspirin every day.  Taking vitamin and mineral supplements as recommended by your health care provider. What happens during an annual well check? The services and screenings done by your health care provider during your annual well check will depend on your age, overall health, lifestyle risk factors, and family history of disease. Counseling  Your health care provider may ask you questions about your:  Alcohol use.  Tobacco use.  Drug use.  Emotional well-being.  Home and relationship well-being.  Sexual activity.  Eating habits.  History of falls.  Memory and ability to understand (cognition).  Work and work environment. Screening  You may have the following tests or measurements:  Height, weight, and BMI.  Blood pressure.  Lipid and cholesterol levels. These may be checked every 5 years, or more frequently if you are over 50 years old.  Skin check.  Lung cancer screening. You may have this screening every year starting at age 55 if you have a 30-pack-year history of smoking and currently smoke or have quit within the past 15 years.  Fecal occult blood test (FOBT) of the stool. You may have  this test every year starting at age 50.  Flexible sigmoidoscopy or colonoscopy. You may have a sigmoidoscopy every 5 years or a colonoscopy every 10 years starting at age 50.  Prostate cancer screening. Recommendations will vary depending on your family history and other risks.  Hepatitis C blood test.  Hepatitis B blood test.  Sexually transmitted disease (STD) testing.  Diabetes screening. This is done by checking your blood sugar (glucose) after you have not eaten for a while (fasting). You may have this done every 1-3 years.  Abdominal aortic aneurysm (AAA) screening. You may need this if you are a current or former smoker.  Osteoporosis. You may be screened starting at age 70 if you are at high risk. Talk with your health care provider about your test results, treatment options, and if necessary, the need for more tests. Vaccines  Your health care provider may recommend certain vaccines, such as:  Influenza vaccine. This is recommended every year.  Tetanus, diphtheria, and acellular pertussis (Tdap, Td) vaccine. You may need a Td booster every 10 years.  Varicella vaccine. You may need this if you have not been vaccinated.  Zoster vaccine. You may need this after age 60.  Measles, mumps, and rubella (MMR) vaccine. You may need at least one dose of MMR if you were born in 1957 or later. You may also need a second dose.  Pneumococcal 13-valent conjugate (PCV13) vaccine. One dose is recommended after age 73.  Pneumococcal polysaccharide (PPSV23) vaccine. One dose is recommended after age 73.  Meningococcal vaccine. You may need this   if you have certain conditions.  Hepatitis A vaccine. You may need this if you have certain conditions or if you travel or work in places where you may be exposed to hepatitis A.  Hepatitis B vaccine. You may need this if you have certain conditions or if you travel or work in places where you may be exposed to hepatitis B.  Haemophilus  influenzae type b (Hib) vaccine. You may need this if you have certain risk factors. Talk to your health care provider about which screenings and vaccines you need and how often you need them. This information is not intended to replace advice given to you by your health care provider. Make sure you discuss any questions you have with your health care provider. Document Released: 08/12/2015 Document Revised: 04/04/2016 Document Reviewed: 05/17/2015 Elsevier Interactive Patient Education  2017 [employer]  

## 2016-12-18 NOTE — Progress Notes (Signed)
Patient ID: Kyle Long, male   DOB: Nov 28, 1945, 71 y.o.   MRN: [id number]   Subjective:  I acted as a Education administrator for [name], [place], [place]   Patient ID: Kyle Long, male    DOB: 1946-06-17, 71 y.o.   MRN: [id number]  Chief Complaint  Patient presents with  . Annual Exam  . Hyperlipidemia  . Hypertension    Hyperlipidemia  This is a chronic problem. The problem is controlled. Pertinent negatives include no chest pain or shortness of breath. Risk factors for coronary artery disease include hypertension and male sex.  Hypertension  This is a chronic problem. The problem is controlled. Pertinent negatives include no blurred vision, chest pain, headaches, malaise/fatigue, palpitations or shortness of breath. Risk factors for coronary artery disease include male gender.    Patient is in today for an annual examination. Patient states that he had a precancerous cell removed from his chest on 12/13/2016. Also wants to know if his dermatology, nephrology and oncollgy referrals need to be updated. Also states that his last 4 blood pressure readings have been in the 150s.  Patient has a Hx of HTN, hyperlipidemia, hyperglycemia, tachycardia, dyspnea, cough, gout, obesity. Patient has no acute concerns noted at this time. Denies any recent febrile illness or hospitalizations. No acute concerns. Denies CP/palp/SOB/HA/congestion/fevers/GI or GU c/o. Taking meds as prescribed.  Patient Care Team: [name], MD as PCP - General (Family Medicine) Kris Mouton Chauncy Passy, MD as Referring Physician (Nephrology) Curt Bears, MD as Consulting Physician (Oncology) Geri Seminole, MD as Referring Physician (Dermatopathology) Marica Otter, OD as Consulting Physician (Optometry)   Past Medical History:  Diagnosis Date  . Allergic state 06/26/2015  . Amnesia 2009   isolated;for 30 mins w/elevated BP  . Arthritis   . Cancer ([place])    skin / top head  . Cellulitis 2011   LUE (no PMH  of MRSA)/left elbow  . Diverticulosis   . Diverticulosis of colon   . GERD (gastroesophageal reflux disease)    OCC HEARTBURN  . Gilbert's syndrome   . Hematuria 2009   Dr Burnell Blanks  . Hyperglycemia   . Hyperlipidemia   . Hypertension   . Lung cancer, lower lobe (HCC)    Stage IIA (T1,N1)  . Neuromuscular disorder ([place])    2010 TEMPORARY MEMORY LOSS 1-1.5 HR WAS CHECKED OUT AT [facility] , NOTHING FOUND  . Peptic ulcer 1995  . Peyronie disease   . Preventative health care 12/19/2015  . Primary cancer of left lower lobe of lung ([place]) 11/27/2011  . Recurrent upper respiratory infection (URI)    09/2011  BRONCHITIS  . SCC (squamous cell carcinoma) 01/21/2009   Qualifier: Diagnosis of  By: Linna Darner MD, Gwyndolyn Saxon   ? Basal Cell      Past Surgical History:  Procedure Laterality Date  . chemo treatment      4 treatments that last 7 hours each/last treatmebt in 2013  . COLONOSCOPY W/ POLYPECTOMY  2005   due 2015  . CYSTOSCOPY  2009  . LOBECTOMY  11/02/2011   Procedure: LOBECTOMY;  Surgeon: Melrose Nakayama, MD;  Location: [place];  Service: Thoracic;  Laterality: Left;  LEFT LOWER LOBECTOMY  . REFRACTIVE SURGERY     Bil  . SKIN BIOPSY     4 0r 5  . Squamous Cell Cancer Excision  12/13/2016  . SUPRACLAVICAL NODE BIOPSY Left 10/15/2014   Procedure: LEFT SUPRACLAVICAL LYMPH NODE BIOPSY;  Surgeon: Melrose Nakayama, MD;  Location: [facility];  Service: Thoracic;  Laterality: Left;  LEFT SUPRACLAVICAL LYMPH NODE BIOPSY  . VASECTOMY    . VEIN SURGERY     R ankle    Family History  Problem Relation Age of Onset  . Stroke Mother        in 22s  . Hypertension Mother   . Breast cancer Mother   . Neuropathy Mother   . Melanoma Father   . Heart attack Father 16       stents  . Dementia Father   . Hypertension Sister   . Thyroid disease Sister        ??  . Multiple myeloma Sister   . Cancer Sister   . Stomach cancer Maternal Grandfather   . Lung cancer Maternal Grandfather   .  Cancer Paternal Grandmother        lung, nonsmoker  . Headache Daughter   . Diabetes Paternal Aunt     Social History   Social History  . Marital status: Married    Spouse name: N/A  . Number of children: N/A  . Years of education: N/A   Occupational History  . retired    Social History Main Topics  . Smoking status: Former Smoker    Packs/day: 1.00    Years: 15.00    Quit date: 07/31/1979  . Smokeless tobacco: Never Used  . Alcohol use 6.6 oz/week    2 Glasses of wine, 9 Shots of liquor per week     Comment: Daily  . Drug use: No  . 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   Other Topics Concern  . Not on file   Social History Narrative   Reg exercise    Outpatient Medications Prior to Visit  Medication Sig Dispense Refill  . CARTIA XT 240 MG 24 hr capsule TAKE 1 CAPSULE EVERY DAY 90 capsule 0  . losartan (COZAAR) 100 MG tablet TAKE 1/2 TABLET (50MG) EVERY DAY 45 tablet 3  . metoprolol tartrate (LOPRESSOR) 25 MG tablet TAKE 1 TABLET TWICE DAILY 180 tablet 0   No facility-administered medications prior to visit.     Allergies  Allergen Reactions  . Lisinopril Cough    Review of Systems  Constitutional: Negative for fever and malaise/fatigue.  HENT: Negative for congestion.   Eyes: Negative for blurred vision.  Respiratory: Negative for cough and shortness of breath.   Cardiovascular: Negative for chest pain, palpitations and leg swelling.  Gastrointestinal: Negative for vomiting.  Musculoskeletal: Negative for back pain and falls.  Skin: Negative for rash.  Neurological: Negative for loss of consciousness and headaches.  Endo/Heme/Allergies: Negative for environmental allergies.  Psychiatric/Behavioral: Negative for depression.       Objective:    Physical Exam  Constitutional: He is oriented to person, place, and time. He appears well-developed and  well-nourished. No distress.  HENT:  Head: Normocephalic and atraumatic.  Eyes: Conjunctivae are normal.  Neck: Normal range of motion. No thyromegaly present.  Cardiovascular: Normal rate and regular rhythm.   Pulmonary/Chest: Effort normal and breath sounds normal. He has no wheezes.  Abdominal: Soft. Bowel sounds are normal. There is no tenderness.  Musculoskeletal: He exhibits no edema or deformity.  Neurological: He is alert and oriented to person, place, and time.  Skin: Skin is warm and dry. No rash noted. He is not diaphoretic.  steristrips over lesion over sternum no surrounding fluctuance  or erythema  Psychiatric: He has a normal mood and affect.    BP (!) 153/73 (BP Location: Left Arm, Patient Position: Sitting, Cuff Size: Normal)   Pulse 72   Temp 98.2 F (36.8 C) (Oral)   Ht '5\' 10"'$  (1.778 m)   Wt 224 lb 6.4 oz (101.8 kg)   SpO2 100% Comment: RA  BMI 32.20 kg/m  Wt Readings from Last 3 Encounters:  12/18/16 224 lb 6.4 oz (101.8 kg)  11/16/16 225 lb (102.1 kg)  11/15/16 226 lb 9.6 oz (102.8 kg)   BP Readings from Last 3 Encounters:  12/18/16 (!) 153/73  11/16/16 (!) 150/90  11/15/16 (!) 176/98     Immunization History  Administered Date(s) Administered  . Influenza,inj,Quad PF,36+ Mos 06/17/2015  . Pneumococcal Conjugate-13 12/13/2014  . Pneumococcal Polysaccharide-23 02/01/2010  . Td 09/27/2001  . Tdap 09/04/2012    Health Maintenance  Topic Date Due  . Hepatitis C Screening  11/16/2017 (Originally 1946-06-23)  . INFLUENZA VACCINE  02/27/2017  . COLONOSCOPY  10/27/2018  . TETANUS/TDAP  09/04/2022  . PNA vac Low Risk Adult  Completed    Lab Results  Component Value Date   WBC 7.1 11/12/2016   HGB 14.6 11/12/2016   HCT 42.6 11/12/2016   PLT 164 11/12/2016   GLUCOSE 113 11/12/2016   CHOL 168 12/15/2015   TRIG 180.0 (H) 12/15/2015   HDL 45.90 12/15/2015   LDLDIRECT 136.5 06/07/2011   LDLCALC 86 12/15/2015   ALT 36 11/12/2016   AST 30 11/12/2016     NA 139 11/12/2016   K 4.3 11/12/2016   CL 102 12/02/2014   CREATININE 1.3 11/12/2016   BUN 19.4 11/12/2016   CO2 26 11/12/2016   TSH 1.81 12/15/2015   PSA 1.86 09/04/2012   INR 1.03 10/12/2014   HGBA1C 5.4 12/15/2015    Lab Results  Component Value Date   TSH 1.81 12/15/2015   Lab Results  Component Value Date   WBC 7.1 11/12/2016   HGB 14.6 11/12/2016   HCT 42.6 11/12/2016   MCV 96.9 11/12/2016   PLT 164 11/12/2016   Lab Results  Component Value Date   NA 139 11/12/2016   K 4.3 11/12/2016   CHLORIDE 102 11/12/2016   CO2 26 11/12/2016   GLUCOSE 113 11/12/2016   BUN 19.4 11/12/2016   CREATININE 1.3 11/12/2016   BILITOT 1.02 11/12/2016   ALKPHOS 46 11/12/2016   AST 30 11/12/2016   ALT 36 11/12/2016   PROT 7.6 11/12/2016   ALBUMIN 4.2 11/12/2016   CALCIUM 10.0 11/12/2016   ANIONGAP 11 11/12/2016   EGFR 54 (L) 11/12/2016   GFR 52.72 (L) 12/02/2014   Lab Results  Component Value Date   CHOL 168 12/15/2015   Lab Results  Component Value Date   HDL 45.90 12/15/2015   Lab Results  Component Value Date   LDLCALC 86 12/15/2015   Lab Results  Component Value Date   TRIG 180.0 (H) 12/15/2015   Lab Results  Component Value Date   CHOLHDL 4 12/15/2015   Lab Results  Component Value Date   HGBA1C 5.4 12/15/2015         Assessment & Plan:   Problem List Items Addressed This Visit    Hyperlipidemia, mixed    Encouraged heart healthy diet, increase exercise, avoid trans fats, consider a krill oil cap daily      Relevant Medications   diltiazem (CARTIA XT) 240 MG 24 hr capsule   losartan (COZAAR) 100 MG tablet  metoprolol tartrate (LOPRESSOR) 50 MG tablet   Disorder of bilirubin excretion    Has been mild and stable      Essential hypertension    Poorly controlled will alter medications, encouraged DASH diet, minimize caffeine and obtain adequate sleep. Report concerning symptoms and follow up as directed and as needed. Increase Metoprolol  Tartrate 50 po BID. RN BP check in 1 month. Encouraged DASH diet      Relevant Medications   diltiazem (CARTIA XT) 240 MG 24 hr capsule   losartan (COZAAR) 100 MG tablet   metoprolol tartrate (LOPRESSOR) 50 MG tablet   Other Relevant Orders   CBC   Comprehensive metabolic panel   SCC (squamous cell carcinoma)    Recently excised from anterior chest wall by Dr Raliegh Ip and is healing well.       Renal insufficiency    Mild and stable after having trouble several years ago during cancer treatments. Will continue to monitor and sees nephrology annually      Relevant Orders   Ambulatory referral to Nephrology   Hyperglycemia    hgba1c acceptable, minimize simple carbs. Increase exercise as tolerated.       Relevant Orders   Hemoglobin A1c   TSH   Gout - Primary    Check uric acid today      Relevant Orders   Uric acid   Obesity    Encouraged DASH diet, decrease po intake and increase exercise as tolerated. Needs 7-8 hours of sleep nightly. Avoid trans fats, eat small, frequent meals every 4-5 hours with lean proteins, complex carbs and healthy fats. Minimize simple carbs      Preventative health care    Patient encouraged to maintain heart healthy diet, regular exercise, adequate sleep. Consider daily probiotics. Take medications as prescribed. Brought in HCP today      Relevant Orders   Hemoglobin A1c   CBC   Comprehensive metabolic panel   Hepatitis C antibody   Lipid panel   TSH   Uric acid    Other Visit Diagnoses    Mixed hyperlipidemia       Relevant Medications   diltiazem (CARTIA XT) 240 MG 24 hr capsule   losartan (COZAAR) 100 MG tablet   metoprolol tartrate (LOPRESSOR) 50 MG tablet   Other Relevant Orders   Lipid panel   Ambulatory referral to Nephrology   Lymphoma, unspecified body region, unspecified lymphoma type [facility])       Relevant Orders   Ambulatory referral to Hematology / Oncology   Squamous cell carcinoma in situ (SCCIS) of skin of chest        Relevant Orders   Ambulatory referral to Dermatology   Encounter for hepatitis C screening test for low risk patient       Relevant Orders   Hepatitis C antibody      I have discontinued Mr. Messler metoprolol tartrate. I have also changed his CARTIA XT to diltiazem. Additionally, I am having him start on metoprolol tartrate. Lastly, I am having him maintain his losartan.  Meds ordered this encounter  Medications  . diltiazem (CARTIA XT) 240 MG 24 hr capsule    Sig: Take 1 capsule (240 mg total) by mouth daily.    Dispense:  90 capsule    Refill:  1  . losartan (COZAAR) 100 MG tablet    Sig: TAKE 1/2 TABLET ('50MG'$ ) EVERY DAY    Dispense:  45 tablet    Refill:  3  . metoprolol tartrate (LOPRESSOR)  50 MG tablet    Sig: Take 1 tablet (50 mg total) by mouth 2 (two) times daily.    Dispense:  180 tablet    Refill:  1    CMA served as scribe during this visit. History, Physical and Plan performed by medical provider. Documentation and orders reviewed and attested to.  [name], MD

## 2016-12-18 NOTE — Assessment & Plan Note (Signed)
Patient encouraged to maintain heart healthy diet, regular exercise, adequate sleep. Consider daily probiotics. Take medications as prescribed. Brought in HCP today

## 2016-12-18 NOTE — Assessment & Plan Note (Addendum)
Poorly controlled will alter medications, encouraged DASH diet, minimize caffeine and obtain adequate sleep. Report concerning symptoms and follow up as directed and as needed. Increase Metoprolol Tartrate 50 po BID. RN BP check in 1 month. Encouraged DASH diet

## 2016-12-18 NOTE — Assessment & Plan Note (Signed)
Has been mild and stable

## 2017-01-15 ENCOUNTER — Ambulatory Visit (INDEPENDENT_AMBULATORY_CARE_PROVIDER_SITE_OTHER): Payer: Medicare HMO | Admitting: Family Medicine

## 2017-01-15 DIAGNOSIS — I10 Essential (primary) hypertension: Secondary | ICD-10-CM

## 2017-01-15 MED ORDER — METOPROLOL SUCCINATE ER 100 MG PO TB24: 75.0000 mg | ORAL_TABLET | Freq: Two times a day (BID) | ORAL | 3 refills | Status: DC

## 2017-01-15 NOTE — Progress Notes (Signed)
Nurseblood pressure check note reviewed. Agree with documention and plan.

## 2017-01-15 NOTE — Patient Instructions (Signed)
Per Dr. Charlett Blake patient to increase Metoprolol to 1.5 (75 mg) tablets twice daily and return for re-chek of BP in 1 month.  Appointment scheduled for February 14, 2017 @ 10:00am.

## 2017-01-15 NOTE — Progress Notes (Signed)
Pre visit review using our clinic tool,if applicable. No additional management support is needed unless otherwise documented below in the visit note.   Patient in for BP check today per order from Dr. Frederik Pear, MD dated 5/212/18.  Patient states he has taken al BP medications this am.   BP today = 152/90 P =66  Per Dr. Charlett Blake patient to increase Metoprolol to 1.5 (75 mg) tablets twice daily and return for re-chek of BP in 1 month.  Appointment scheduled for February 14, 2017 @ 10:00am.

## 2017-01-29 ENCOUNTER — Other Ambulatory Visit: Payer: Self-pay | Admitting: *Deleted

## 2017-01-29 MED ORDER — METOPROLOL TARTRATE 50 MG PO TABS: 75.0000 mg | ORAL_TABLET | Freq: Two times a day (BID) | ORAL | 0 refills | Status: DC

## 2017-02-13 ENCOUNTER — Ambulatory Visit (HOSPITAL_COMMUNITY): Payer: Medicare HMO

## 2017-02-13 ENCOUNTER — Other Ambulatory Visit: Payer: Medicare HMO

## 2017-02-14 ENCOUNTER — Ambulatory Visit (INDEPENDENT_AMBULATORY_CARE_PROVIDER_SITE_OTHER): Payer: Medicare HMO | Admitting: Family Medicine

## 2017-02-14 VITALS — BP 154/81 | HR 63

## 2017-02-14 DIAGNOSIS — I10 Essential (primary) hypertension: Secondary | ICD-10-CM | POA: Diagnosis not present

## 2017-02-14 MED ORDER — CHLORTHALIDONE 25 MG PO TABS: 12.5000 mg | ORAL_TABLET | Freq: Every day | ORAL | 3 refills | Status: DC

## 2017-02-14 NOTE — Patient Instructions (Addendum)
Per Dr. Charlett Blake: START Chlorthalidone 12.5 MG once daily. Continue current regimen with all other medications. Return in 2 weeks for nurse visit to recheck blood pressure & complete CMP lab work. Keep follow-up appointment with PCP on 06/10/17 at 10:00 AM.

## 2017-02-14 NOTE — Progress Notes (Signed)
Pre visit review using our clinic review tool, if applicable. No additional management support is needed unless otherwise documented below in the visit note.  Patient came in office for blood pressure check per OV note 01/15/17. Patient verbalizes compliance with medications & regimen. He reported consuming 2 cups of caffeineted coffee prior to the appointment. Patient denies chest pain, numbness/tingling in the limbs, headaches, dizziness & lightheadedness. Per the patient, home readings have ranged from BP 147/82 to 138/77. RN obtained the following readings during today's visit: BP 164/89 P 62 & BP 154/81 P 63.  Per Dr. Charlett Blake: START Chlorthalidone 12.5 MG once daily. Continue current regimen with all other medications. Return in 2 weeks for nurse visit to recheck blood pressure & complete CMP lab work. Keep follow-up appointment with PCP on 06/10/17 at 10:00 AM.  Patient was made aware of the provider's recommendations & voiced understanding. Next appointment 03/07/17 at 10:00 AM.   RN blood pressure check note reviewed. Agree with documention and plan.

## 2017-02-15 ENCOUNTER — Other Ambulatory Visit (HOSPITAL_BASED_OUTPATIENT_CLINIC_OR_DEPARTMENT_OTHER): Payer: Medicare HMO

## 2017-02-15 ENCOUNTER — Ambulatory Visit (HOSPITAL_COMMUNITY)
Admission: RE | Admit: 2017-02-15 | Discharge: 2017-02-15 | Disposition: A | Discharge status: Home / Self Care | Payer: Medicare HMO | Source: Ambulatory Visit | Attending: Internal Medicine | Admitting: Internal Medicine

## 2017-02-15 DIAGNOSIS — R918 Other nonspecific abnormal finding of lung field: Secondary | ICD-10-CM | POA: Insufficient documentation

## 2017-02-15 DIAGNOSIS — C3432 Malignant neoplasm of lower lobe, left bronchus or lung: Secondary | ICD-10-CM | POA: Diagnosis not present

## 2017-02-15 DIAGNOSIS — K760 Fatty (change of) liver, not elsewhere classified: Secondary | ICD-10-CM | POA: Insufficient documentation

## 2017-02-15 DIAGNOSIS — I700 Atherosclerosis of aorta: Secondary | ICD-10-CM | POA: Diagnosis not present

## 2017-02-15 DIAGNOSIS — Z902 Acquired absence of lung [part of]: Secondary | ICD-10-CM | POA: Insufficient documentation

## 2017-02-15 DIAGNOSIS — C8300 Small cell B-cell lymphoma, unspecified site: Secondary | ICD-10-CM | POA: Diagnosis not present

## 2017-02-15 DIAGNOSIS — I10 Essential (primary) hypertension: Secondary | ICD-10-CM

## 2017-02-15 DIAGNOSIS — R935 Abnormal findings on diagnostic imaging of other abdominal regions, including retroperitoneum: Secondary | ICD-10-CM | POA: Insufficient documentation

## 2017-02-15 DIAGNOSIS — R911 Solitary pulmonary nodule: Secondary | ICD-10-CM | POA: Diagnosis not present

## 2017-02-15 DIAGNOSIS — C3430 Malignant neoplasm of lower lobe, unspecified bronchus or lung: Secondary | ICD-10-CM | POA: Diagnosis not present

## 2017-02-15 LAB — CBC WITH DIFFERENTIAL/PLATELET
BASO%: 0.6 % (ref 0.0–2.0)
BASOS ABS: 0 10*3/uL (ref 0.0–0.1)
EOS ABS: 0.1 10*3/uL (ref 0.0–0.5)
EOS%: 1.3 % (ref 0.0–7.0)
HCT: 43.4 % (ref 38.4–49.9)
HEMOGLOBIN: 14.8 g/dL (ref 13.0–17.1)
LYMPH%: 32.5 % (ref 14.0–49.0)
MCH: 33.3 pg (ref 27.2–33.4)
MCHC: 34.2 g/dL (ref 32.0–36.0)
MCV: 97.6 fL (ref 79.3–98.0)
MONO#: 0.7 10*3/uL (ref 0.1–0.9)
MONO%: 9.2 % (ref 0.0–14.0)
NEUT#: 4.5 10*3/uL (ref 1.5–6.5)
NEUT%: 56.4 % (ref 39.0–75.0)
Platelets: 175 10*3/uL (ref 140–400)
RBC: 4.45 10*6/uL (ref 4.20–5.82)
RDW: 12.1 % (ref 11.0–14.6)
WBC: 7.9 10*3/uL (ref 4.0–10.3)
lymph#: 2.6 10*3/uL (ref 0.9–3.3)

## 2017-02-15 LAB — COMPREHENSIVE METABOLIC PANEL
ALK PHOS: 51 U/L (ref 40–150)
ALT: 37 U/L (ref 0–55)
AST: 30 U/L (ref 5–34)
Albumin: 4.1 g/dL (ref 3.5–5.0)
Anion Gap: 9 mEq/L (ref 3–11)
BUN: 27.1 mg/dL — AB (ref 7.0–26.0)
CHLORIDE: 104 meq/L (ref 98–109)
CO2: 25 mEq/L (ref 22–29)
Calcium: 10 mg/dL (ref 8.4–10.4)
Creatinine: 1.4 mg/dL — ABNORMAL HIGH (ref 0.7–1.3)
EGFR: 49 mL/min/{1.73_m2} — AB (ref >90)
GLUCOSE: 102 mg/dL (ref 70–140)
POTASSIUM: 4.4 meq/L (ref 3.5–5.1)
SODIUM: 137 meq/L (ref 136–145)
Total Bilirubin: 0.93 mg/dL (ref 0.20–1.20)
Total Protein: 7.5 g/dL (ref 6.4–8.3)

## 2017-02-15 MED ORDER — IOPAMIDOL (ISOVUE-300) INJECTION 61%
75.0000 mL | Freq: Once | INTRAVENOUS | Status: AC | PRN
  Administered 2017-02-15: 75 mL via INTRAVENOUS

## 2017-02-15 MED ORDER — IOPAMIDOL (ISOVUE-300) INJECTION 61%
INTRAVENOUS | Status: AC
  Filled 2017-02-15: qty 75

## 2017-02-18 ENCOUNTER — Encounter: Payer: Self-pay | Admitting: Internal Medicine

## 2017-02-18 ENCOUNTER — Ambulatory Visit (HOSPITAL_BASED_OUTPATIENT_CLINIC_OR_DEPARTMENT_OTHER): Payer: Medicare HMO | Admitting: Internal Medicine

## 2017-02-18 VITALS — BP 177/85 | HR 66 | Temp 98.7°F | Resp 18 | Ht 70.0 in | Wt 224.8 lb

## 2017-02-18 DIAGNOSIS — C8301 Small cell B-cell lymphoma, lymph nodes of head, face, and neck: Secondary | ICD-10-CM | POA: Diagnosis not present

## 2017-02-18 DIAGNOSIS — C3432 Malignant neoplasm of lower lobe, left bronchus or lung: Secondary | ICD-10-CM | POA: Diagnosis not present

## 2017-02-18 DIAGNOSIS — I10 Essential (primary) hypertension: Secondary | ICD-10-CM

## 2017-02-18 DIAGNOSIS — C8300 Small cell B-cell lymphoma, unspecified site: Secondary | ICD-10-CM

## 2017-02-18 NOTE — Progress Notes (Signed)
[place] [phone]   [phone]  OFFICE PROGRESS NOTE  [name], MD [place] [id number]  DIAGNOSIS AND STAGE:  1) small lymphocytic lymphoma involving the left supraclavicular lymph nodes diagnosed in February 2016. 2) Stage IIA (T1a, N1, M0) non-small cell lung cancer consistent with adenocarcinoma with negative EGFR mutation diagnosed in March of 2013.   PRIOR THERAPY:  1) S/P left video-assisted thoracoscopy, wedge resection of left lower lobe nodule, thoracoscopic left lower lobectomy, mediastinal lymph node dissection on 11/02/2011.  2) Adjuvant chemotherapy with cisplatin 75 mg/M2 and Alimta 500 mg/M2 every 3 weeks. She is status post 4 cycles.   CURRENT THERAPY: Observation.  INTERVAL HISTORY: Kyle Long 71 y.o. male returns to the clinic today for follow-up visit accompanied by his wife. The patient is feeling fine today with no specific complaints. He denied having any weight loss or night sweats. He has no nausea, vomiting, diarrhea or constipation. He denied having any chest pain, shortness of breath, cough or hemoptysis. He had repeat CT scan of the chest performed recently and he is here for evaluation and discussion of his scan results.   MEDICAL HISTORY: Past Medical History:  Diagnosis Date  . Allergic state 06/26/2015  . Amnesia 2009   isolated;for 30 mins w/elevated BP  . Arthritis   . Cancer ([place])    skin / top head  . Cellulitis 2011   LUE (no PMH of MRSA)/left elbow  . Diverticulosis   . Diverticulosis of colon   . GERD (gastroesophageal reflux disease)    OCC HEARTBURN  . Gilbert's syndrome   . Hematuria 2009   Dr Burnell Blanks  . Hyperglycemia   . Hyperlipidemia   . Hypertension   . Lung cancer, lower lobe (HCC)    Stage IIA (T1,N1)  . Neuromuscular disorder ([place])    2010 TEMPORARY MEMORY LOSS 1-1.5 HR WAS CHECKED OUT AT [facility] , NOTHING FOUND  .  Peptic ulcer 1995  . Peyronie disease   . Preventative health care 12/19/2015  . Primary cancer of left lower lobe of lung ([place]) 11/27/2011  . Recurrent upper respiratory infection (URI)    09/2011  BRONCHITIS  . SCC (squamous cell carcinoma) 01/21/2009   Qualifier: Diagnosis of  By: Linna Darner MD, Gwyndolyn Saxon   ? Basal Cell      ALLERGIES:  is allergic to lisinopril.  MEDICATIONS:  Current Outpatient Prescriptions  Medication Sig Dispense Refill  . chlorthalidone (HYGROTON) 25 MG tablet Take 0.5 tablets (12.5 mg total) by mouth daily. 30 tablet 3  . diltiazem (CARTIA XT) 240 MG 24 hr capsule Take 1 capsule (240 mg total) by mouth daily. 90 capsule 1  . losartan (COZAAR) 100 MG tablet TAKE 1/2 TABLET (50MG) EVERY DAY 45 tablet 3  . metoprolol tartrate (LOPRESSOR) 50 MG tablet Take 1.5 tablets (75 mg total) by mouth 2 (two) times daily. 135 tablet 0   No current facility-administered medications for this visit.     SURGICAL HISTORY:  Past Surgical History:  Procedure Laterality Date  . chemo treatment      4 treatments that last 7 hours each/last treatmebt in 2013  . COLONOSCOPY W/ POLYPECTOMY  2005   due 2015  . CYSTOSCOPY  2009  . LOBECTOMY  11/02/2011   Procedure: LOBECTOMY;  Surgeon: Melrose Nakayama, MD;  Location: [place];  Service: Thoracic;  Laterality: Left;  LEFT LOWER LOBECTOMY  . REFRACTIVE  SURGERY     Bil  . SKIN BIOPSY     4 0r 5  . Squamous Cell Cancer Excision  12/13/2016  . SUPRACLAVICAL NODE BIOPSY Left 10/15/2014   Procedure: LEFT SUPRACLAVICAL LYMPH NODE BIOPSY;  Surgeon: Melrose Nakayama, MD;  Location: [place];  Service: Thoracic;  Laterality: Left;  LEFT SUPRACLAVICAL LYMPH NODE BIOPSY  . VASECTOMY    . VEIN SURGERY     R ankle    REVIEW OF SYSTEMS:  A comprehensive review of systems was negative.   PHYSICAL EXAMINATION: General appearance: alert, cooperative and no distress Head: Normocephalic, without obvious abnormality, atraumatic Neck: no adenopathy,  no JVD, supple, symmetrical, trachea midline and thyroid not enlarged, symmetric, no tenderness/mass/nodules Lymph nodes: Cervical, supraclavicular, and axillary nodes normal. Resp: clear to auscultation bilaterally Back: symmetric, no curvature. ROM normal. No CVA tenderness. Cardio: regular rate and rhythm, S1, S2 normal, no murmur, click, rub or gallop GI: soft, non-tender; bowel sounds normal; no masses,  no organomegaly Extremities: extremities normal, atraumatic, no cyanosis or edema  ECOG PERFORMANCE STATUS: 1 - Symptomatic but completely ambulatory  Blood pressure (!) 177/85, pulse 66, temperature 98.7 F (37.1 C), temperature source Oral, resp. rate 18, height 5' 10"  (1.778 m), weight 224 lb 12.8 oz (102 kg), SpO2 98 %.  LABORATORY DATA: Lab Results  Component Value Date   WBC 7.9 02/15/2017   HGB 14.8 02/15/2017   HCT 43.4 02/15/2017   MCV 97.6 02/15/2017   PLT 175 02/15/2017      Chemistry      Component Value Date/Time   NA 137 02/15/2017 1053   K 4.4 02/15/2017 1053   CL 103 12/18/2016 1145   CL 104 09/03/2012 1003   CO2 25 02/15/2017 1053   BUN 27.1 (H) 02/15/2017 1053   CREATININE 1.4 (H) 02/15/2017 1053      Component Value Date/Time   CALCIUM 10.0 02/15/2017 1053   ALKPHOS 51 02/15/2017 1053   AST 30 02/15/2017 1053   ALT 37 02/15/2017 1053   BILITOT 0.93 02/15/2017 1053       RADIOGRAPHIC STUDIES: Ct Chest W Contrast  Result Date: 02/15/2017 CLINICAL DATA:  Left lung cancer in 2013.  Lymphoma in 2016. EXAM: CT CHEST WITH CONTRAST TECHNIQUE: Multidetector CT imaging of the chest was performed during intravenous contrast administration. CONTRAST:  89m ISOVUE-300 IOPAMIDOL (ISOVUE-300) INJECTION 61% COMPARISON:  11/12/2016 FINDINGS: Cardiovascular: The heart size is normal. No pericardial effusion. Atherosclerotic calcification is noted in the wall of the thoracic aorta. Mediastinum/Nodes: No mediastinal lymphadenopathy. There is no hilar  lymphadenopathy. The esophagus has normal imaging features. Increased number of tiny lymph nodes in each axillary region noted, similar to prior. Lungs/Pleura: Volume loss left hemithorax compatible with prior left lower lobectomy. 7 mm right upper lobe pulmonary nodule, new on the prior study, has resolved in the interval. The 8 mm right upper lobe pulmonary nodule (Image 57 series 7 today) is stable. 4 mm right perifissural nodule seen previously is stable. No focal airspace consolidation. No pulmonary edema or pleural effusion. Upper Abdomen: Multiple hepatic cysts again identified measuring up to 2.3 cm in the lateral segment left liver The liver shows diffusely decreased attenuation suggesting steatosis. Possible tiny gallstones seen image 159 of series 2. Diffuse diverticulosis noted within the transverse colon. Musculoskeletal: Bone windows reveal no worrisome lytic or sclerotic osseous lesions. IMPRESSION: 1. The new 7 mm right upper lobe pulmonary nodule seen on the prior study has resolved in the interval. Other previously characterized  small right lung nodules are unchanged. No new or progressive lung abnormality on today's study. 2. Status post left lower lobectomy. 3. Possible cholelithiasis. 4. Hepatic steatosis. 5.  Aortic Atherosclerois (ICD10-170.0) Electronically Signed   By: Misty Stanley M.D.   On: 02/15/2017 14:32    ASSESSMENT AND PLAN: This is a very pleasant 71 years old white male with: 1) stage II a non-small cell lung cancer, adenocarcinoma status post wedge resection of the left lower lobe with lymph node dissection in April 2013 followed by adjuvant systemic chemotherapy with cisplatin and Alimta. He had repeat CT scan of the chest performed recently that showed resolution of the 7 mm right upper lobe pulmonary nodule. The scan results with the patient and his wife and recommended for him to continue on observation with repeat CT scan of the chest in 6 months. 2) small lymphocytic  lymphoma: He will continue on observation for now. 3) hypertension:  he was seen recently by his primary care physician who change in his blood pressure medication of the patient has not started the new medication yet. I recommended for him to start his prescription as soon as possible and to report to his primary care physician if no improvement.  He was advised to call immediately if he has any concerning symptoms in the interval. The patient voices understanding of current disease status and treatment options and is in agreement with the current care plan. All questions were answered. The patient knows to call the clinic with any problems, questions or concerns. We can certainly see the patient much sooner if necessary.  Disclaimer: This note was dictated with voice recognition software. Similar sounding words can inadvertently be transcribed and may not be corrected upon review.

## 2017-03-07 ENCOUNTER — Ambulatory Visit (INDEPENDENT_AMBULATORY_CARE_PROVIDER_SITE_OTHER): Payer: Medicare HMO | Admitting: Family Medicine

## 2017-03-07 VITALS — BP 159/95 | HR 68

## 2017-03-07 DIAGNOSIS — I10 Essential (primary) hypertension: Secondary | ICD-10-CM | POA: Diagnosis not present

## 2017-03-07 LAB — BASIC METABOLIC PANEL
BUN: 28 mg/dL — AB (ref 6–23)
CO2: 31 mEq/L (ref 19–32)
CREATININE: 1.5 mg/dL (ref 0.40–1.50)
Calcium: 9.5 mg/dL (ref 8.4–10.5)
Chloride: 100 mEq/L (ref 96–112)
GFR: 48.77 mL/min — AB (ref >60.00)
GLUCOSE: 108 mg/dL — AB (ref 70–99)
POTASSIUM: 4 meq/L (ref 3.5–5.1)
Sodium: 137 mEq/L (ref 135–145)

## 2017-03-07 NOTE — Progress Notes (Addendum)
Pre visit review using our clinic tool,if applicable. No additional management support is needed unless otherwise documented below in the visit note.   Patient in for BP check per order dated 02/14/2017 by Dr. Frederik Pear.  Patient started on Chlorthalidone 25 mg which he takes 1/2 12.5 daily. Patient brought in his recordings of BP.   BP today = 159/95 P= 64  Per Dr. Charlett Blake patient to increase Chlorthalidone to 12.5mg  daily, have BMP checked today and return for BP check in 2-4 weeks.   Patient agreed to orders. States he will be back for BP check in 4 weeks.   RN blood pressure check note reviewed. Agree with documention and plan.

## 2017-03-28 ENCOUNTER — Ambulatory Visit (INDEPENDENT_AMBULATORY_CARE_PROVIDER_SITE_OTHER): Payer: Medicare HMO | Admitting: Family Medicine

## 2017-03-28 ENCOUNTER — Other Ambulatory Visit (INDEPENDENT_AMBULATORY_CARE_PROVIDER_SITE_OTHER): Payer: Medicare HMO

## 2017-03-28 VITALS — BP 140/71 | HR 65

## 2017-03-28 DIAGNOSIS — I10 Essential (primary) hypertension: Secondary | ICD-10-CM

## 2017-03-28 LAB — BASIC METABOLIC PANEL
BUN: 19 mg/dL (ref 6–23)
CALCIUM: 9.7 mg/dL (ref 8.4–10.5)
CHLORIDE: 99 meq/L (ref 96–112)
CO2: 31 meq/L (ref 19–32)
CREATININE: 1.44 mg/dL (ref 0.40–1.50)
GFR: 51.12 mL/min — ABNORMAL LOW (ref >60.00)
Glucose, Bld: 120 mg/dL — ABNORMAL HIGH (ref 70–99)
Potassium: 4.1 mEq/L (ref 3.5–5.1)
SODIUM: 137 meq/L (ref 135–145)

## 2017-03-28 MED ORDER — CHLORTHALIDONE 25 MG PO TABS: 25.0000 mg | ORAL_TABLET | Freq: Every day | ORAL | 3 refills | Status: DC

## 2017-03-28 NOTE — Progress Notes (Signed)
RN blood pressure check note reviewed. Agree with documention and plan. 

## 2017-03-28 NOTE — Patient Instructions (Addendum)
Per Dr. Charlett Blake: Increase Chlorthalidone to 25 MG once daily. Continue all other medications & regimen. Complete BMP lab work today. Return in 1 month for a nurse visit to recheck blood pressure.

## 2017-03-28 NOTE — Progress Notes (Signed)
Pre visit review using our clinic review tool, if applicable. No additional management support is needed unless otherwise documented below in the visit note.  Patient presents in office for blood pressure check per OV note 03/07/17. He voiced adherence to all medications & regimen. Patient denies headaches, dizziness & lightheadedness. Today's readings were as follow: BP 155/64 P 65 & BP 140/71 P 65. Also, patient brought in a daily log of his readings at home for PCP to review.  Per Dr. Charlett Blake: Increase Chlorthalidone to 25 MG once daily. Continue all other medications & regimen. Complete BMP lab work today. Return in 1 month for a nurse visit to recheck blood pressure.  Informed patient of the provider's recommendations. He verbalized understanding. Next appointment 04/30/17 at 10:00 AM.  RN blood pressure check note reviewed. Agree with documention and plan.

## 2017-04-16 ENCOUNTER — Other Ambulatory Visit: Payer: Self-pay | Admitting: Family Medicine

## 2017-04-30 ENCOUNTER — Ambulatory Visit (INDEPENDENT_AMBULATORY_CARE_PROVIDER_SITE_OTHER): Payer: Medicare HMO | Admitting: Family Medicine

## 2017-04-30 DIAGNOSIS — I10 Essential (primary) hypertension: Secondary | ICD-10-CM | POA: Diagnosis not present

## 2017-04-30 NOTE — Progress Notes (Signed)
Pre visit review using our clinic tool,if applicable. No additional management support is needed unless otherwise documented below in the visit note.   Patient in for BP check per order from Dr. Frederik Pear dated 03/28/17.  Chlorthalidone increased on last office visit to 25 mg.  No complaints voiced this visit. Patient has taken BP medication today.  Patient brought in BP readings from 03/29/17 up until 04/29/17 given to provider.  BP today = 147/95 P = 64  Per Dr. Charlett Blake patient to continue taking medications as ordered for now.  BP's at home running  [id number] Systolic and 89-21 Diastolic.  Patient to return on scheduled appointment date. Per Dr. Charlett Blake she may make adjustment to Metoprolol depending on how BP is running on that day. Patient notified.

## 2017-04-30 NOTE — Progress Notes (Signed)
Nurse blood pressure check note reviewed. Agree with documention and plan.

## 2017-05-22 ENCOUNTER — Ambulatory Visit (INDEPENDENT_AMBULATORY_CARE_PROVIDER_SITE_OTHER): Payer: Medicare HMO | Admitting: Medical

## 2017-05-22 ENCOUNTER — Encounter: Payer: Self-pay | Admitting: Medical

## 2017-05-22 VITALS — BP 140/89 | HR 98 | Temp 98.0°F | Resp 16 | Ht 70.0 in | Wt 224.0 lb

## 2017-05-22 DIAGNOSIS — J40 Bronchitis, not specified as acute or chronic: Secondary | ICD-10-CM | POA: Diagnosis not present

## 2017-05-22 MED ORDER — AZITHROMYCIN 250 MG PO TABS: ORAL_TABLET | ORAL | 0 refills | Status: DC

## 2017-05-22 MED ORDER — BENZONATATE 100 MG PO CAPS: 100.0000 mg | ORAL_CAPSULE | Freq: Three times a day (TID) | ORAL | 0 refills | Status: DC | PRN

## 2017-05-22 MED FILL — AZITHROMYCIN 250 MG TABLET: 250 | 5 days supply | Qty: 6 | Fill #0

## 2017-05-22 NOTE — Progress Notes (Signed)
Subjective:    Patient ID: Kyle Long, male    DOB: [birth year], 71 y.o.   MRN: [id number]  HPI   Pt in with some recent cough for about one week. Various family member with uri/cough type illness. Pt states cough productive since onset. No fever, no chills or sweats. Pt states wife had similar illness and needed antibiotic to recover. Pt request antibiotic.   Pt has history of lung cancer 5 years ago and portion of lung removed.  No wheezing  No nasal congestion.      Review of Systems  Constitutional: Negative for chills, fatigue and fever.  HENT: Positive for postnasal drip. Negative for congestion, ear pain, rhinorrhea, sinus pressure, sneezing and tinnitus.   Respiratory: Positive for cough. Negative for chest tightness, shortness of breath and wheezing.        Chest congestion  Cardiovascular: Negative for chest pain and palpitations.  Gastrointestinal: Negative for abdominal pain, anal bleeding and diarrhea.  Musculoskeletal: Negative for back pain.  Skin: Negative for rash.  Neurological: Negative for dizziness, weakness, light-headedness and numbness.  Hematological: Negative for adenopathy. Does not bruise/bleed easily.  Psychiatric/Behavioral: Negative for behavioral problems and confusion. The patient is not nervous/anxious.    Past Medical History:  Diagnosis Date  . Allergic state 06/26/2015  . Amnesia 2009   isolated;for 30 mins w/elevated BP  . Arthritis   . Cancer ([place])    skin / top head  . Cellulitis 2011   LUE (no PMH of MRSA)/left elbow  . Diverticulosis   . Diverticulosis of colon   . GERD (gastroesophageal reflux disease)    OCC HEARTBURN  . Gilbert's syndrome   . Hematuria 2009   Dr Burnell Blanks  . Hyperglycemia   . Hyperlipidemia   . Hypertension   . Lung cancer, lower lobe (HCC)    Stage IIA (T1,N1)  . Neuromuscular disorder ([place])    2010 TEMPORARY MEMORY LOSS 1-1.5 HR WAS CHECKED OUT AT [facility] , NOTHING FOUND  . Peptic ulcer  1995  . Peyronie disease   . Preventative health care 12/19/2015  . Primary cancer of left lower lobe of lung ([place]) 11/27/2011  . Recurrent upper respiratory infection (URI)    09/2011  BRONCHITIS  . SCC (squamous cell carcinoma) 01/21/2009   Qualifier: Diagnosis of  By: Linna Darner MD, Gwyndolyn Saxon   ? Basal Cell       Social History   Social History  . Marital status: Married    Spouse name: N/A  . Number of children: N/A  . Years of education: N/A   Occupational History  . retired    Social History Main Topics  . Smoking status: Former Smoker    Packs/day: 1.00    Years: 15.00    Quit date: 07/31/1979  . Smokeless tobacco: Never Used  . Alcohol use 6.6 oz/week    2 Glasses of wine, 9 Shots of liquor per week     Comment: Daily  . Drug use: No  . 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   Other Topics Concern  . Not on file   Social History Narrative   Reg exercise    Past Surgical History:  Procedure Laterality Date  . chemo treatment      4 treatments that last 7 hours each/last treatmebt in 2013  . COLONOSCOPY W/ POLYPECTOMY  2005   due 2015  .  CYSTOSCOPY  2009  . LOBECTOMY  11/02/2011   Procedure: LOBECTOMY;  Surgeon: Melrose Nakayama, MD;  Location: [facility];  Service: Thoracic;  Laterality: Left;  LEFT LOWER LOBECTOMY  . REFRACTIVE SURGERY     Bil  . SKIN BIOPSY     4 0r 5  . Squamous Cell Cancer Excision  12/13/2016  . SUPRACLAVICAL NODE BIOPSY Left 10/15/2014   Procedure: LEFT SUPRACLAVICAL LYMPH NODE BIOPSY;  Surgeon: Melrose Nakayama, MD;  Location: [facility];  Service: Thoracic;  Laterality: Left;  LEFT SUPRACLAVICAL LYMPH NODE BIOPSY  . VASECTOMY    . VEIN SURGERY     R ankle    Family History  Problem Relation Age of Onset  . Stroke Mother        in 44s  . Hypertension Mother   . Breast cancer Mother   . Neuropathy Mother   . Melanoma Father   . Heart attack  Father 66       stents  . Dementia Father   . Hypertension Sister   . Thyroid disease Sister        ??  . Multiple myeloma Sister   . Cancer Sister   . Stomach cancer Maternal Grandfather   . Lung cancer Maternal Grandfather   . Cancer Paternal Grandmother        lung, nonsmoker  . Headache Daughter   . Diabetes Paternal Aunt     Allergies  Allergen Reactions  . Lisinopril Cough    Current Outpatient Prescriptions on File Prior to Visit  Medication Sig Dispense Refill  . CARTIA XT 240 MG 24 hr capsule TAKE 1 CAPSULE EVERY DAY 90 capsule 0  . chlorthalidone (HYGROTON) 25 MG tablet TAKE 1 TABLET EVERY DAY 90 tablet 0  . losartan (COZAAR) 100 MG tablet TAKE 1/2 TABLET (50MG) EVERY DAY 45 tablet 3  . metoprolol tartrate (LOPRESSOR) 50 MG tablet TAKE 1 AND 1/2 TABLETS TWICE DAILY 135 tablet 0   No current facility-administered medications on file prior to visit.     BP (!) 148/76   Pulse 98   Temp 98 F (36.7 C) (Oral)   Resp 16   Ht 5' 10"  (1.778 m)   Wt 224 lb (101.6 kg)   SpO2 100%   BMI 32.14 kg/m       Objective:   Physical Exam  General  Mental Status - Alert. General Appearance - Well groomed. Not in acute distress.  Skin Rashes- No Rashes.  HEENT Head- Normal. Ear Auditory Canal - Left- Normal. Right - Normal.Tympanic Membrane- Left- Normal. Right- Normal. Eye Sclera/Conjunctiva- Left- Normal. Right- Normal. Nose & Sinuses Nasal Mucosa- Left-  Boggy and Congested. Right-  Boggy and  Congested.Bilateral no  maxillary and no  frontal sinus pressure. Mouth & Throat Lips: Upper Lip- Normal: no dryness, cracking, pallor, cyanosis, or vesicular eruption. Lower Lip-Normal: no dryness, cracking, pallor, cyanosis or vesicular eruption. Buccal Mucosa- Bilateral- No Aphthous ulcers. Oropharynx- No Discharge or Erythema. Tonsils: Characteristics- Bilateral- No Erythema or Congestion. Size/Enlargement- Bilateral- No enlargement. Discharge-  bilateral-None.  Neck Neck- Supple. No Masses.   Chest and Lung Exam Auscultation: Breath Sounds:-Clear even and unlabored.  Cardiovascular Auscultation:Rythm- Regular, rate and rhythm. Murmurs & Other Heart Sounds:Ausculatation of the heart reveal- No Murmurs.  Lymphatic Head & Neck General Head & Neck Lymphatics: Bilateral: Description- No Localized lymphadenopathy.  Lower ext-no pedal edema.       Assessment & Plan:  You appear to have bronchitis. Rest hydrate and tylenol  for fever. I am prescribing cough medicine benzonatate, and azithromycin antibiotic.   You should gradually get better. If not then notify us and would recommend a chest xray.  Follow up in 7-10 days or as needed  Bethany Hirt, Percell Miller, [employer]

## 2017-05-22 NOTE — Patient Instructions (Addendum)
You appear to have bronchitis. Rest hydrate and tylenol for fever. I am prescribing cough medicine benzonatate, and azithromycin antibiotic.   You should gradually get better. If not then notify us and would recommend a chest xray.  Follow up in 7-10 days or as needed

## 2017-05-29 DIAGNOSIS — L821 Other seborrheic keratosis: Secondary | ICD-10-CM | POA: Diagnosis not present

## 2017-05-29 DIAGNOSIS — Z85828 Personal history of other malignant neoplasm of skin: Secondary | ICD-10-CM | POA: Diagnosis not present

## 2017-05-29 DIAGNOSIS — L570 Actinic keratosis: Secondary | ICD-10-CM | POA: Diagnosis not present

## 2017-05-29 DIAGNOSIS — D492 Neoplasm of unspecified behavior of bone, soft tissue, and skin: Secondary | ICD-10-CM | POA: Diagnosis not present

## 2017-05-29 DIAGNOSIS — L400 Psoriasis vulgaris: Secondary | ICD-10-CM | POA: Diagnosis not present

## 2017-05-29 DIAGNOSIS — L814 Other melanin hyperpigmentation: Secondary | ICD-10-CM | POA: Diagnosis not present

## 2017-05-29 DIAGNOSIS — L579 Skin changes due to chronic exposure to nonionizing radiation, unspecified: Secondary | ICD-10-CM | POA: Diagnosis not present

## 2017-06-04 MED FILL — MOMETASONE FUROATE 0.1% CRM: 0.1 | 30 days supply | Qty: 60 | Fill #0

## 2017-06-07 DIAGNOSIS — N183 Chronic kidney disease, stage 3 (moderate): Secondary | ICD-10-CM | POA: Diagnosis not present

## 2017-06-07 DIAGNOSIS — D649 Anemia, unspecified: Secondary | ICD-10-CM | POA: Diagnosis not present

## 2017-06-07 DIAGNOSIS — C3490 Malignant neoplasm of unspecified part of unspecified bronchus or lung: Secondary | ICD-10-CM | POA: Diagnosis not present

## 2017-06-07 DIAGNOSIS — Z23 Encounter for immunization: Secondary | ICD-10-CM | POA: Diagnosis not present

## 2017-06-07 DIAGNOSIS — Z87891 Personal history of nicotine dependence: Secondary | ICD-10-CM | POA: Diagnosis not present

## 2017-06-07 DIAGNOSIS — Z9221 Personal history of antineoplastic chemotherapy: Secondary | ICD-10-CM | POA: Diagnosis not present

## 2017-06-07 DIAGNOSIS — I129 Hypertensive chronic kidney disease with stage 1 through stage 4 chronic kidney disease, or unspecified chronic kidney disease: Secondary | ICD-10-CM | POA: Diagnosis not present

## 2017-06-10 ENCOUNTER — Ambulatory Visit: Payer: Medicare HMO | Admitting: Family Medicine

## 2017-07-11 ENCOUNTER — Ambulatory Visit: Payer: Medicare HMO | Admitting: Family Medicine

## 2017-07-11 ENCOUNTER — Encounter: Payer: Self-pay | Admitting: Family Medicine

## 2017-07-11 DIAGNOSIS — I10 Essential (primary) hypertension: Secondary | ICD-10-CM

## 2017-07-11 DIAGNOSIS — R351 Nocturia: Secondary | ICD-10-CM | POA: Diagnosis not present

## 2017-07-11 DIAGNOSIS — E782 Mixed hyperlipidemia: Secondary | ICD-10-CM

## 2017-07-11 DIAGNOSIS — R739 Hyperglycemia, unspecified: Secondary | ICD-10-CM | POA: Diagnosis not present

## 2017-07-11 DIAGNOSIS — M1A9XX0 Chronic gout, unspecified, without tophus (tophi): Secondary | ICD-10-CM

## 2017-07-11 DIAGNOSIS — C8300 Small cell B-cell lymphoma, unspecified site: Secondary | ICD-10-CM | POA: Diagnosis not present

## 2017-07-11 DIAGNOSIS — R7401 Elevation of levels of liver transaminase levels: Secondary | ICD-10-CM

## 2017-07-11 DIAGNOSIS — R740 Nonspecific elevation of levels of transaminase and lactic acid dehydrogenase [LDH]: Secondary | ICD-10-CM | POA: Diagnosis not present

## 2017-07-11 DIAGNOSIS — N401 Enlarged prostate with lower urinary tract symptoms: Secondary | ICD-10-CM | POA: Diagnosis not present

## 2017-07-11 LAB — CBC WITH DIFFERENTIAL/PLATELET
BASOS PCT: 0.3 % (ref 0.0–3.0)
Basophils Absolute: 0 10*3/uL (ref 0.0–0.1)
EOS ABS: 0.1 10*3/uL (ref 0.0–0.7)
EOS PCT: 0.8 % (ref 0.0–5.0)
HCT: 42.6 % (ref 39.0–52.0)
Hemoglobin: 14.2 g/dL (ref 13.0–17.0)
LYMPHS ABS: 2.9 10*3/uL (ref 0.7–4.0)
Lymphocytes Relative: 33.1 % (ref 12.0–46.0)
MCHC: 33.3 g/dL (ref 30.0–36.0)
MCV: 100.5 fl — ABNORMAL HIGH (ref 78.0–100.0)
MONO ABS: 0.9 10*3/uL (ref 0.1–1.0)
Monocytes Relative: 9.9 % (ref 3.0–12.0)
NEUTROS ABS: 5 10*3/uL (ref 1.4–7.7)
Neutrophils Relative %: 55.9 % (ref 43.0–77.0)
PLATELETS: 199 10*3/uL (ref 150.0–400.0)
RBC: 4.24 Mil/uL (ref 4.22–5.81)
RDW: 13 % (ref 11.5–15.5)
WBC: 8.9 10*3/uL (ref 4.0–10.5)

## 2017-07-11 LAB — LIPID PANEL
CHOL/HDL RATIO: 3
CHOLESTEROL: 175 mg/dL (ref 0–200)
HDL: 53.4 mg/dL (ref >39.00)
NONHDL: 121.85
TRIGLYCERIDES: 241 mg/dL — AB (ref 0.0–149.0)
VLDL: 48.2 mg/dL — AB (ref 0.0–40.0)

## 2017-07-11 LAB — COMPREHENSIVE METABOLIC PANEL
ALT: 29 U/L (ref 0–53)
AST: 25 U/L (ref 0–37)
Albumin: 4.3 g/dL (ref 3.5–5.2)
Alkaline Phosphatase: 41 U/L (ref 39–117)
BUN: 25 mg/dL — AB (ref 6–23)
CHLORIDE: 99 meq/L (ref 96–112)
CO2: 32 meq/L (ref 19–32)
CREATININE: 1.46 mg/dL (ref 0.40–1.50)
Calcium: 9.7 mg/dL (ref 8.4–10.5)
GFR: 50.27 mL/min — ABNORMAL LOW (ref >60.00)
Glucose, Bld: 118 mg/dL — ABNORMAL HIGH (ref 70–99)
POTASSIUM: 3.9 meq/L (ref 3.5–5.1)
SODIUM: 139 meq/L (ref 135–145)
Total Bilirubin: 1.1 mg/dL (ref 0.2–1.2)
Total Protein: 7.3 g/dL (ref 6.0–8.3)

## 2017-07-11 LAB — HEMOGLOBIN A1C: Hgb A1c MFr Bld: 5.8 % (ref 4.6–6.5)

## 2017-07-11 LAB — LDL CHOLESTEROL, DIRECT: Direct LDL: 96 mg/dL

## 2017-07-11 LAB — TSH: TSH: 1.88 u[IU]/mL (ref 0.35–4.50)

## 2017-07-11 LAB — LACTATE DEHYDROGENASE: LDH: 142 U/L (ref 120–250)

## 2017-07-11 MED ORDER — METOPROLOL TARTRATE 50 MG PO TABS: ORAL_TABLET | ORAL | 0 refills | Status: DC

## 2017-07-11 MED ORDER — CHLORTHALIDONE 25 MG PO TABS: 25.0000 mg | ORAL_TABLET | Freq: Every day | ORAL | 0 refills | Status: DC

## 2017-07-11 NOTE — Assessment & Plan Note (Signed)
Follows with Dr Earlie Server

## 2017-07-11 NOTE — Progress Notes (Signed)
Subjective:  I acted as a Education administrator for [employer]. Yancey Flemings, [place]   Patient ID: Kyle Long, male    DOB: Jun 26, 1946, 71 y.o.   MRN: [id number]  Chief Complaint  Patient presents with  . Follow-up    HPI  Patient is in today for 6 month follow up visit and reports overall he is doing well.  No recent febrile illness or hospitalization.  He has been eating well and staying active.  No difficulties with activities of daily living.  Does not endorse polyuria or polydipsia. Denies CP/palp/SOB/HA/congestion/fevers/GI or GU c/o. Taking meds as prescribed  Patient Care Team: [name], MD as PCP - General (Family Medicine) Kris Mouton Chauncy Passy, MD as Referring Physician (Nephrology) Curt Bears, MD as Consulting Physician (Oncology) Geri Seminole, MD as Referring Physician (Dermatopathology) Marica Otter, OD as Consulting Physician (Optometry)   Past Medical History:  Diagnosis Date  . Allergic state 06/26/2015  . Amnesia 2009   isolated;for 30 mins w/elevated BP  . Arthritis   . Cancer ([place])    skin / top head  . Cellulitis 2011   LUE (no PMH of MRSA)/left elbow  . Diverticulosis   . Diverticulosis of colon   . GERD (gastroesophageal reflux disease)    OCC HEARTBURN  . Gilbert's syndrome   . Hematuria 2009   Dr Burnell Blanks  . Hyperglycemia   . Hyperlipidemia   . Hypertension   . Lung cancer, lower lobe (HCC)    Stage IIA (T1,N1)  . Neuromuscular disorder ([place])    2010 TEMPORARY MEMORY LOSS 1-1.5 HR WAS CHECKED OUT AT [facility] , NOTHING FOUND  . Peptic ulcer 1995  . Peyronie disease   . Preventative health care 12/19/2015  . Primary cancer of left lower lobe of lung ([place]) 11/27/2011  . Recurrent upper respiratory infection (URI)    09/2011  BRONCHITIS  . SCC (squamous cell carcinoma) 01/21/2009   Qualifier: Diagnosis of  By: Linna Darner MD, Gwyndolyn Saxon   ? Basal Cell      Past Surgical History:  Procedure Laterality Date  . chemo treatment      4 treatments  that last 7 hours each/last treatmebt in 2013  . COLONOSCOPY W/ POLYPECTOMY  2005   due 2015  . CYSTOSCOPY  2009  . LOBECTOMY  11/02/2011   Procedure: LOBECTOMY;  Surgeon: Melrose Nakayama, MD;  Location: [facility];  Service: Thoracic;  Laterality: Left;  LEFT LOWER LOBECTOMY  . REFRACTIVE SURGERY     Bil  . SKIN BIOPSY     4 0r 5  . Squamous Cell Cancer Excision  12/13/2016  . SUPRACLAVICAL NODE BIOPSY Left 10/15/2014   Procedure: LEFT SUPRACLAVICAL LYMPH NODE BIOPSY;  Surgeon: Melrose Nakayama, MD;  Location: [facility];  Service: Thoracic;  Laterality: Left;  LEFT SUPRACLAVICAL LYMPH NODE BIOPSY  . VASECTOMY    . VEIN SURGERY     R ankle    Family History  Problem Relation Age of Onset  . Stroke Mother        in 32s  . Hypertension Mother   . Breast cancer Mother   . Neuropathy Mother   . Melanoma Father   . Heart attack Father 84       stents  . Dementia Father   . Hypertension Sister   . Thyroid disease Sister        ??  . Multiple myeloma Sister   . Cancer Sister   . Stomach cancer Maternal Grandfather   .  Lung cancer Maternal Grandfather   . Cancer Paternal Grandmother        lung, nonsmoker  . Headache Daughter   . Diabetes Paternal Aunt     Social History   Socioeconomic History  . Marital status: Married    Spouse name: Not on file  . Number of children: Not on file  . Years of education: Not on file  . Highest education level: Not on file  Social Needs  . Financial resource strain: Not on file  . Food insecurity - worry: Not on file  . Food insecurity - inability: Not on file  . Transportation needs - medical: Not on file  . Transportation needs - non-medical: Not on file  Occupational History  . Occupation: retired  Tobacco Use  . Smoking status: Former Smoker    Packs/day: 1.00    Years: 15.00    Pack years: 15.00    Last attempt to quit: 07/31/1979    Years since quitting: 37.9  . Smokeless tobacco: Never Used  Substance and Sexual Activity    . Alcohol use: Yes    Alcohol/week: 6.6 oz    Types: 2 Glasses of wine, 9 Shots of liquor per week    Comment: Daily  . Drug use: No  . 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  Other Topics Concern  . Not on file  Social History Narrative   Reg exercise    Outpatient Medications Prior to Visit  Medication Sig Dispense Refill  . CARTIA XT 240 MG 24 hr capsule TAKE 1 CAPSULE EVERY DAY 90 capsule 0  . losartan (COZAAR) 100 MG tablet TAKE 1/2 TABLET (50MG) EVERY DAY 45 tablet 3  . azithromycin (ZITHROMAX) 250 MG tablet Take 2 tablets by mouth on day 1, followed by 1 tablet by mouth daily for 4 days. 6 tablet 0  . benzonatate (TESSALON) 100 MG capsule Take 1 capsule (100 mg total) by mouth 3 (three) times daily as needed for cough. 21 capsule 0  . chlorthalidone (HYGROTON) 25 MG tablet TAKE 1 TABLET EVERY DAY 90 tablet 0  . metoprolol tartrate (LOPRESSOR) 50 MG tablet TAKE 1 AND 1/2 TABLETS TWICE DAILY 135 tablet 0   No facility-administered medications prior to visit.     Allergies  Allergen Reactions  . Lisinopril Cough    Review of Systems  Constitutional: Negative for fever and malaise/fatigue.  HENT: Negative for congestion.   Eyes: Negative for blurred vision.  Respiratory: Negative for shortness of breath.   Cardiovascular: Negative for chest pain, palpitations and leg swelling.  Gastrointestinal: Negative for abdominal pain, blood in stool and nausea.  Genitourinary: Negative for dysuria and frequency.  Musculoskeletal: Negative for falls.  Skin: Negative for rash.  Neurological: Negative for dizziness, loss of consciousness and headaches.  Endo/Heme/Allergies: Negative for environmental allergies.  Psychiatric/Behavioral: Negative for depression. The patient is not nervous/anxious.        Objective:    Physical Exam  Constitutional: He is oriented to person, place, and  time. He appears well-developed and well-nourished. No distress.  HENT:  Head: Normocephalic and atraumatic.  Nose: Nose normal.  Eyes: Right eye exhibits no discharge. Left eye exhibits no discharge.  Neck: Normal range of motion. Neck supple.  Cardiovascular: Normal rate and regular rhythm.  No murmur heard. Pulmonary/Chest: Effort normal and breath sounds normal.  Abdominal: Soft. Bowel sounds are normal. There is no tenderness.  Musculoskeletal: He exhibits no edema.  Neurological: He is alert and oriented to person, place, and time.  Skin: Skin is warm and dry.  Psychiatric: He has a normal mood and affect.  Nursing note and vitals reviewed.   BP (!) 150/75 (BP Location: Left Arm, Patient Position: Sitting, Cuff Size: Normal)   Pulse 65   Temp 98.3 F (36.8 C) (Oral)   Resp 16   Wt 229 lb 3.2 oz (104 kg)   BMI 32.89 kg/m  Wt Readings from Last 3 Encounters:  07/11/17 229 lb 3.2 oz (104 kg)  05/22/17 224 lb (101.6 kg)  02/18/17 224 lb 12.8 oz (102 kg)   BP Readings from Last 3 Encounters:  07/11/17 (!) 150/75  05/22/17 140/89  03/28/17 140/71     Immunization History  Administered Date(s) Administered  . Influenza,inj,Quad PF,6+ Mos 06/17/2015  . Pneumococcal Conjugate-13 12/13/2014  . Pneumococcal Polysaccharide-23 02/01/2010  . Td 09/27/2001  . Tdap 09/04/2012    Health Maintenance  Topic Date Due  . COLONOSCOPY  10/27/2018  . TETANUS/TDAP  09/04/2022  . INFLUENZA VACCINE  Completed  . Hepatitis C Screening  Completed  . PNA vac Low Risk Adult  Completed    Lab Results  Component Value Date   WBC 8.9 07/11/2017   HGB 14.2 07/11/2017   HCT 42.6 07/11/2017   PLT 199.0 07/11/2017   GLUCOSE 118 (H) 07/11/2017   CHOL 175 07/11/2017   TRIG 241.0 (H) 07/11/2017   HDL 53.40 07/11/2017   LDLDIRECT 96.0 07/11/2017   LDLCALC 86 12/15/2015   ALT 29 07/11/2017   AST 25 07/11/2017   NA 139 07/11/2017   K 3.9 07/11/2017   CL 99 07/11/2017   CREATININE 1.46  07/11/2017   BUN 25 (H) 07/11/2017   CO2 32 07/11/2017   TSH 1.88 07/11/2017   PSA 1.86 09/04/2012   INR 1.03 10/12/2014   HGBA1C 5.8 07/11/2017    Lab Results  Component Value Date   TSH 1.88 07/11/2017   Lab Results  Component Value Date   WBC 8.9 07/11/2017   HGB 14.2 07/11/2017   HCT 42.6 07/11/2017   MCV 100.5 (H) 07/11/2017   PLT 199.0 07/11/2017   Lab Results  Component Value Date   NA 139 07/11/2017   K 3.9 07/11/2017   CHLORIDE 104 02/15/2017   CO2 32 07/11/2017   GLUCOSE 118 (H) 07/11/2017   BUN 25 (H) 07/11/2017   CREATININE 1.46 07/11/2017   BILITOT 1.1 07/11/2017   ALKPHOS 41 07/11/2017   AST 25 07/11/2017   ALT 29 07/11/2017   PROT 7.3 07/11/2017   ALBUMIN 4.3 07/11/2017   CALCIUM 9.7 07/11/2017   ANIONGAP 9 02/15/2017   EGFR 49 (L) 02/15/2017   GFR 50.27 (L) 07/11/2017   Lab Results  Component Value Date   CHOL 175 07/11/2017   Lab Results  Component Value Date   HDL 53.40 07/11/2017   Lab Results  Component Value Date   LDLCALC 86 12/15/2015   Lab Results  Component Value Date   TRIG 241.0 (H) 07/11/2017   Lab Results  Component Value Date   CHOLHDL 3 07/11/2017   Lab Results  Component Value Date   HGBA1C 5.8 07/11/2017         Assessment & Plan:   Problem List Items Addressed This Visit    Hyperlipidemia, mixed    Encouraged heart healthy diet, increase exercise, avoid trans fats, consider a krill oil cap daily  Relevant Medications   chlorthalidone (HYGROTON) 25 MG tablet   metoprolol tartrate (LOPRESSOR) 50 MG tablet   Other Relevant Orders   Lipid panel (Completed)   Essential hypertension    Not controlled, no changes to meds. Encouraged heart healthy diet such as the DASH diet and exercise as tolerated.       Relevant Medications   chlorthalidone (HYGROTON) 25 MG tablet   metoprolol tartrate (LOPRESSOR) 50 MG tablet   Other Relevant Orders   CBC with Differential/Platelet (Completed)   Comprehensive  metabolic panel (Completed)   TSH (Completed)   Benign prostatic hyperplasia    Check a PSA today has some nocturia check a PSA      Nonspecific elevation of levels of transaminase or lactic acid dehydrogenase (LDH)    Check LDH      Relevant Orders   Lactate Dehydrogenase (LDH) (Completed)   Hyperglycemia    hgba1c acceptable, minimize simple carbs. Increase exercise as tolerated. Continue current meds      Relevant Orders   Hemoglobin A1c (Completed)   Gout    No recent flares      Malignant lymphoma, small lymphocytic ([place])    Follows with Dr Earlie Server         I have discontinued Lanell Matar. Paletta's benzonatate and azithromycin. I have also changed his chlorthalidone. Additionally, I am having him maintain his losartan, CARTIA XT, and metoprolol tartrate.  Meds ordered this encounter  Medications  . chlorthalidone (HYGROTON) 25 MG tablet    Sig: Take 1 tablet (25 mg total) by mouth daily.    Dispense:  90 tablet    Refill:  0  . metoprolol tartrate (LOPRESSOR) 50 MG tablet    Sig: TAKE 1 AND 1/2 TABLETS TWICE DAILY    Dispense:  135 tablet    Refill:  0    CMA served as scribe during this visit. History, Physical and Plan performed by medical provider. Documentation and orders reviewed and attested to.  [name], MD

## 2017-07-11 NOTE — Assessment & Plan Note (Signed)
hgba1c acceptable, minimize simple carbs. Increase exercise as tolerated. Continue current meds 

## 2017-07-11 NOTE — Assessment & Plan Note (Signed)
Check a PSA today has some nocturia check a PSA

## 2017-07-11 NOTE — Assessment & Plan Note (Signed)
Not controlled, no changes to meds. Encouraged heart healthy diet such as the DASH diet and exercise as tolerated.

## 2017-07-11 NOTE — Assessment & Plan Note (Signed)
Encouraged heart healthy diet, increase exercise, avoid trans fats, consider a krill oil cap daily 

## 2017-07-11 NOTE — Assessment & Plan Note (Signed)
Check LDH

## 2017-07-11 NOTE — Assessment & Plan Note (Signed)
No recent flares 

## 2017-07-11 NOTE — Patient Instructions (Addendum)
Call Dr Lew Dawes nurse next week and notify them we did labs including LDH and confirm you do not need further labs prior to your appt with him  Shingrix is the new shingles 2 shots over 2-6 months at the pharmacy Hypertension Hypertension is another name for high blood pressure. High blood pressure forces your heart to work harder to pump blood. This can cause problems over time. There are two numbers in a blood pressure reading. There is a top number (systolic) over a bottom number (diastolic). It is best to have a blood pressure below 120/80. Healthy choices can help lower your blood pressure. You may need medicine to help lower your blood pressure if:  Your blood pressure cannot be lowered with healthy choices.  Your blood pressure is higher than 130/80.  Follow these instructions at home: Eating and drinking  If directed, follow the DASH eating plan. This diet includes: ? Filling half of your plate at each meal with fruits and vegetables. ? Filling one quarter of your plate at each meal with whole grains. Whole grains include whole wheat pasta, brown rice, and whole grain bread. ? Eating or drinking low-fat dairy products, such as skim milk or low-fat yogurt. ? Filling one quarter of your plate at each meal with low-fat (lean) proteins. Low-fat proteins include fish, skinless chicken, eggs, beans, and tofu. ? Avoiding fatty meat, cured and processed meat, or chicken with skin. ? Avoiding premade or processed food.  Eat less than 1,500 mg of salt (sodium) a day.  Limit alcohol use to no more than 1 drink a day for nonpregnant women and 2 drinks a day for men. One drink equals 12 oz of beer, 5 oz of wine, or 1 oz of hard liquor. Lifestyle  Work with your doctor to stay at a healthy weight or to lose weight. Ask your doctor what the best weight is for you.  Get at least 30 minutes of exercise that causes your heart to beat faster (aerobic exercise) most days of the week. This may  include walking, swimming, or biking.  Get at least 30 minutes of exercise that strengthens your muscles (resistance exercise) at least 3 days a week. This may include lifting weights or pilates.  Do not use any products that contain nicotine or tobacco. This includes cigarettes and e-cigarettes. If you need help quitting, ask your doctor.  Check your blood pressure at home as told by your doctor.  Keep all follow-up visits as told by your doctor. This is important. Medicines  Take over-the-counter and prescription medicines only as told by your doctor. Follow directions carefully.  Do not skip doses of blood pressure medicine. The medicine does not work as well if you skip doses. Skipping doses also puts you at risk for problems.  Ask your doctor about side effects or reactions to medicines that you should watch for. Contact a doctor if:  You think you are having a reaction to the medicine you are taking.  You have headaches that keep coming back (recurring).  You feel dizzy.  You have swelling in your ankles.  You have trouble with your vision. Get help right away if:  You get a very bad headache.  You start to feel confused.  You feel weak or numb.  You feel faint.  You get very bad pain in your: ? Chest. ? Belly (abdomen).  You throw up (vomit) more than once.  You have trouble breathing. Summary  Hypertension is another name for high  blood pressure.  Making healthy choices can help lower blood pressure. If your blood pressure cannot be controlled with healthy choices, you may need to take medicine. This information is not intended to replace advice given to you by your health care provider. Make sure you discuss any questions you have with your health care provider. Document Released: 01/02/2008 Document Revised: 06/13/2016 Document Reviewed: 06/13/2016 Elsevier Interactive Patient Education  [employer].

## 2017-07-18 ENCOUNTER — Telehealth: Payer: Self-pay | Admitting: Medical Oncology

## 2017-07-18 NOTE — Telephone Encounter (Signed)
Returned call no answer. Pt needs to know he does not need labs on 1/21 and lab appt cancelled.

## 2017-08-12 ENCOUNTER — Other Ambulatory Visit: Payer: Self-pay | Admitting: Family Medicine

## 2017-08-19 ENCOUNTER — Other Ambulatory Visit: Payer: Medicare HMO

## 2017-08-19 ENCOUNTER — Ambulatory Visit (HOSPITAL_COMMUNITY)
Admission: RE | Admit: 2017-08-19 | Discharge: 2017-08-19 | Disposition: A | Discharge status: Home / Self Care | Payer: Medicare HMO | Source: Ambulatory Visit | Attending: Internal Medicine | Admitting: Internal Medicine

## 2017-08-19 ENCOUNTER — Encounter (HOSPITAL_COMMUNITY): Payer: Self-pay

## 2017-08-19 DIAGNOSIS — C8300 Small cell B-cell lymphoma, unspecified site: Secondary | ICD-10-CM

## 2017-08-19 DIAGNOSIS — C3432 Malignant neoplasm of lower lobe, left bronchus or lung: Secondary | ICD-10-CM | POA: Insufficient documentation

## 2017-08-19 DIAGNOSIS — I2510 Atherosclerotic heart disease of native coronary artery without angina pectoris: Secondary | ICD-10-CM | POA: Diagnosis not present

## 2017-08-19 DIAGNOSIS — I700 Atherosclerosis of aorta: Secondary | ICD-10-CM | POA: Insufficient documentation

## 2017-08-19 MED ORDER — IOPAMIDOL (ISOVUE-300) INJECTION 61%
INTRAVENOUS | Status: AC
  Filled 2017-08-19: qty 75

## 2017-08-19 MED ORDER — IOPAMIDOL (ISOVUE-300) INJECTION 61%
75.0000 mL | Freq: Once | INTRAVENOUS | Status: AC | PRN
  Administered 2017-08-19: 75 mL via INTRAVENOUS

## 2017-08-26 ENCOUNTER — Inpatient Hospital Stay: Payer: Medicare HMO | Attending: Internal Medicine | Admitting: Internal Medicine

## 2017-08-26 ENCOUNTER — Telehealth: Payer: Self-pay | Admitting: Internal Medicine

## 2017-08-26 ENCOUNTER — Encounter: Payer: Self-pay | Admitting: Internal Medicine

## 2017-08-26 DIAGNOSIS — C3490 Malignant neoplasm of unspecified part of unspecified bronchus or lung: Secondary | ICD-10-CM

## 2017-08-26 DIAGNOSIS — I10 Essential (primary) hypertension: Secondary | ICD-10-CM | POA: Diagnosis not present

## 2017-08-26 DIAGNOSIS — C3432 Malignant neoplasm of lower lobe, left bronchus or lung: Secondary | ICD-10-CM | POA: Insufficient documentation

## 2017-08-26 DIAGNOSIS — R21 Rash and other nonspecific skin eruption: Secondary | ICD-10-CM | POA: Diagnosis not present

## 2017-08-26 DIAGNOSIS — C8590 Non-Hodgkin lymphoma, unspecified, unspecified site: Secondary | ICD-10-CM | POA: Diagnosis not present

## 2017-08-26 NOTE — Telephone Encounter (Signed)
Gave avs and calendar for july °

## 2017-08-26 NOTE — Progress Notes (Signed)
[facility] [phone]   [phone]  OFFICE PROGRESS NOTE  [name], MD [place] [id number]  DIAGNOSIS AND STAGE:  1) small lymphocytic lymphoma involving the left supraclavicular lymph nodes diagnosed in February 2016. 2) Stage IIA (T1a, N1, M0) non-small cell lung cancer consistent with adenocarcinoma with negative EGFR mutation diagnosed in March of 2013.   PRIOR THERAPY:  1) S/P left video-assisted thoracoscopy, wedge resection of left lower lobe nodule, thoracoscopic left lower lobectomy, mediastinal lymph node dissection on 11/02/2011.  2) Adjuvant chemotherapy with cisplatin 75 mg/M2 and Alimta 500 mg/M2 every 3 weeks. She is status post 4 cycles.   CURRENT THERAPY: Observation.  INTERVAL HISTORY: Kyle Long 72 y.o. male returns to the clinic today for 6 months follow-up visit accompanied by his wife.  The patient is feeling fine today with no specific complaints except for several areas of skin rash on the upper extremities.  He was recently diagnosed with psoriasis.  The patient denied having any current chest pain, shortness breath, cough or hemoptysis.  He denied having any weight loss or night sweats.  He has no nausea, vomiting, diarrhea or constipation.  He has no palpable lymphadenopathy.  He had repeat CT scan of the chest performed recently and he is here for evaluation and discussion of his discuss results.  MEDICAL HISTORY: Past Medical History:  Diagnosis Date  . Allergic state 06/26/2015  . Amnesia 2009   isolated;for 30 mins w/elevated BP  . Arthritis   . Cancer ([facility])    skin / top head  . Cellulitis 2011   LUE (no PMH of MRSA)/left elbow  . Diverticulosis   . Diverticulosis of colon   . GERD (gastroesophageal reflux disease)    OCC HEARTBURN  . Gilbert's syndrome   . Hematuria 2009   Dr Burnell Blanks  . Hyperglycemia   . Hyperlipidemia   . Hypertension   . Lung  cancer, lower lobe (HCC)    Stage IIA (T1,N1)  . Neuromuscular disorder ([facility])    2010 TEMPORARY MEMORY LOSS 1-1.5 HR WAS CHECKED OUT AT [facility] , NOTHING FOUND  . Peptic ulcer 1995  . Peyronie disease   . Preventative health care 12/19/2015  . Primary cancer of left lower lobe of lung ([facility]) 11/27/2011  . Recurrent upper respiratory infection (URI)    09/2011  BRONCHITIS  . SCC (squamous cell carcinoma) 01/21/2009   Qualifier: Diagnosis of  By: Linna Darner MD, Gwyndolyn Saxon   ? Basal Cell      ALLERGIES:  is allergic to lisinopril.  MEDICATIONS:  Current Outpatient Medications  Medication Sig Dispense Refill  . CARTIA XT 240 MG 24 hr capsule TAKE 1 CAPSULE EVERY DAY 90 capsule 0  . chlorthalidone (HYGROTON) 25 MG tablet Take 1 tablet (25 mg total) by mouth daily. 90 tablet 0  . losartan (COZAAR) 100 MG tablet TAKE 1/2 TABLET (50MG) EVERY DAY 45 tablet 3  . metoprolol tartrate (LOPRESSOR) 50 MG tablet TAKE 1 AND 1/2 TABLETS TWICE DAILY 135 tablet 0   No current facility-administered medications for this visit.     SURGICAL HISTORY:  Past Surgical History:  Procedure Laterality Date  . chemo treatment      4 treatments that last 7 hours each/last treatmebt in 2013  . COLONOSCOPY W/ POLYPECTOMY  2005   due 2015  . CYSTOSCOPY  2009  . LOBECTOMY  11/02/2011   Procedure: LOBECTOMY;  Surgeon:  Melrose Nakayama, MD;  Location: [facility];  Service: Thoracic;  Laterality: Left;  LEFT LOWER LOBECTOMY  . REFRACTIVE SURGERY     Bil  . SKIN BIOPSY     4 0r 5  . Squamous Cell Cancer Excision  12/13/2016  . SUPRACLAVICAL NODE BIOPSY Left 10/15/2014   Procedure: LEFT SUPRACLAVICAL LYMPH NODE BIOPSY;  Surgeon: Melrose Nakayama, MD;  Location: [facility];  Service: Thoracic;  Laterality: Left;  LEFT SUPRACLAVICAL LYMPH NODE BIOPSY  . VASECTOMY    . VEIN SURGERY     R ankle    REVIEW OF SYSTEMS:  A comprehensive review of systems was negative.   PHYSICAL EXAMINATION: General appearance: alert,  cooperative and no distress Head: Normocephalic, without obvious abnormality, atraumatic Neck: no adenopathy, no JVD, supple, symmetrical, trachea midline and thyroid not enlarged, symmetric, no tenderness/mass/nodules Lymph nodes: Cervical, supraclavicular, and axillary nodes normal. Resp: clear to auscultation bilaterally Back: symmetric, no curvature. ROM normal. No CVA tenderness. Cardio: regular rate and rhythm, S1, S2 normal, no murmur, click, rub or gallop GI: soft, non-tender; bowel sounds normal; no masses,  no organomegaly Extremities: extremities normal, atraumatic, no cyanosis or edema  ECOG PERFORMANCE STATUS: 1 - Symptomatic but completely ambulatory  Blood pressure (!) 149/73, pulse 71, temperature 98.1 F (36.7 C), temperature source Oral, resp. rate 18, height 5' 10"  (1.778 m), weight 229 lb 4.8 oz (104 kg), SpO2 100 %.  LABORATORY DATA: Lab Results  Component Value Date   WBC 8.9 07/11/2017   HGB 14.2 07/11/2017   HCT 42.6 07/11/2017   MCV 100.5 (H) 07/11/2017   PLT 199.0 07/11/2017      Chemistry      Component Value Date/Time   NA 139 07/11/2017 1156   NA 137 02/15/2017 1053   K 3.9 07/11/2017 1156   K 4.4 02/15/2017 1053   CL 99 07/11/2017 1156   CL 104 09/03/2012 1003   CO2 32 07/11/2017 1156   CO2 25 02/15/2017 1053   BUN 25 (H) 07/11/2017 1156   BUN 27.1 (H) 02/15/2017 1053   CREATININE 1.46 07/11/2017 1156   CREATININE 1.4 (H) 02/15/2017 1053      Component Value Date/Time   CALCIUM 9.7 07/11/2017 1156   CALCIUM 10.0 02/15/2017 1053   ALKPHOS 41 07/11/2017 1156   ALKPHOS 51 02/15/2017 1053   AST 25 07/11/2017 1156   AST 30 02/15/2017 1053   ALT 29 07/11/2017 1156   ALT 37 02/15/2017 1053   BILITOT 1.1 07/11/2017 1156   BILITOT 0.93 02/15/2017 1053       RADIOGRAPHIC STUDIES: Ct Chest W Contrast  Result Date: 08/19/2017 CLINICAL DATA:  Followup small lymphocytic lymphoma and left lower lobe lung carcinoma. Status post chemotherapy.  EXAM: CT CHEST WITH CONTRAST TECHNIQUE: Multidetector CT imaging of the chest was performed during intravenous contrast administration. CONTRAST:  11m ISOVUE-300 IOPAMIDOL (ISOVUE-300) INJECTION 61% COMPARISON:  02/15/2017 and earlier studies dating back to 06/03/2012 FINDINGS: Cardiovascular: No acute findings. Aortic and coronary artery atherosclerosis. Mediastinum/Nodes: No masses or pathologically enlarged lymph nodes identified. Lungs/Pleura: Stable postop changes from left lower lobectomy. 8 mm right upper lobe pulmonary nodule remains stable compared to previous studies dating back to 2013. No new or enlarging pulmonary nodules or masses identified. No evidence of pulmonary infiltrate or pleural effusion. Upper Abdomen: Normal adrenal glands. Stable small hepatic cysts and tiny left posterior fundal gastric diverticulum. Diverticulosis again seen involving the visualized portion of the colon. Musculoskeletal:  No suspicious bone lesions. IMPRESSION: Stable  exam. No evidence of malignancy or other active disease within the thorax. Aortic Atherosclerosis (ICD10-I70.0). Coronary artery calcification. Electronically Signed   By: Earle Gell M.D.   On: 08/19/2017 13:08    ASSESSMENT AND PLAN: This is a very pleasant 72 years old white male with: 1) stage IIA non-small cell lung cancer, adenocarcinoma status post wedge resection of the left lower lobe with lymph node dissection in April 2013 followed by adjuvant systemic chemotherapy with cisplatin and Alimta. A recent CT scan of the chest showed no concerning findings for disease recurrence or progression. I discussed the scan results with the patient and his wife. I recommended for him to continue on observation with repeat CT scan of the chest, abdomen and pelvis in 6 months for restaging of his disease for the lung cancer as well as the small lymphocytic lymphoma. 2) small lymphocytic lymphoma: He will continue on observation for now. The patient was  advised to call immediately if he has any concerning symptoms in the interval. The patient voices understanding of current disease status and treatment options and is in agreement with the current care plan. All questions were answered. The patient knows to call the clinic with any problems, questions or concerns. We can certainly see the patient much sooner if necessary.  Disclaimer: This note was dictated with voice recognition software. Similar sounding words can inadvertently be transcribed and may not be corrected upon review.

## 2017-09-05 DIAGNOSIS — H5212 Myopia, left eye: Secondary | ICD-10-CM | POA: Diagnosis not present

## 2017-09-12 DIAGNOSIS — L400 Psoriasis vulgaris: Secondary | ICD-10-CM | POA: Diagnosis not present

## 2017-09-12 MED FILL — CLOBETASOL PROPIONATE 0.05: 0.05 | 30 days supply | Qty: 60 | Fill #0

## 2017-09-16 ENCOUNTER — Other Ambulatory Visit: Payer: Self-pay | Admitting: Family Medicine

## 2017-09-19 DIAGNOSIS — Z0100 Encounter for examination of eyes and vision without abnormal findings: Secondary | ICD-10-CM | POA: Diagnosis not present

## 2017-10-21 MED FILL — CLOBETASOL PROPIONATE 0.05: 0.05 | 30 days supply | Qty: 60 | Fill #1

## 2017-11-06 DIAGNOSIS — L579 Skin changes due to chronic exposure to nonionizing radiation, unspecified: Secondary | ICD-10-CM | POA: Diagnosis not present

## 2017-11-06 DIAGNOSIS — L570 Actinic keratosis: Secondary | ICD-10-CM | POA: Diagnosis not present

## 2017-11-06 DIAGNOSIS — L814 Other melanin hyperpigmentation: Secondary | ICD-10-CM | POA: Diagnosis not present

## 2017-11-06 DIAGNOSIS — L821 Other seborrheic keratosis: Secondary | ICD-10-CM | POA: Diagnosis not present

## 2017-11-06 DIAGNOSIS — L400 Psoriasis vulgaris: Secondary | ICD-10-CM | POA: Diagnosis not present

## 2017-11-06 DIAGNOSIS — Z85828 Personal history of other malignant neoplasm of skin: Secondary | ICD-10-CM | POA: Diagnosis not present

## 2017-11-14 NOTE — Progress Notes (Signed)
Subjective:   Kyle Long is a 72 y.o. male who presents for Medicare Annual/Subsequent preventive examination.  Review of Systems: No ROS.  Medicare Wellness Visit. Additional risk factors are reflected in the social history.  Cardiac Risk Factors include: advanced age (>26mn, >>77women);dyslipidemia;hypertension;male gender Sleep patterns: Wakes 3-4x to urinate. Sleeps 8 hrs. Home Safety/Smoke Alarms: Feels safe in home. Smoke alarms in place.  Living environment; residence and Firearm Safety: Lives with wife in 2 story home.   Male:   CCS-  Utd. Due 2020   PSA-  Lab Results  Component Value Date   PSA 1.86 09/04/2012   PSA 1.39 10/10/2011   PSA 1.64 02/01/2010       Objective:    Vitals: BP 140/76 (BP Location: Left Arm, Patient Position: Sitting, Cuff Size: Normal)   Pulse 61   Ht _0  (1.778 m)   Wt 227 lb 3.2 oz (103.1 kg)   SpO2 98%   BMI 32.60 kg/m   Body mass index is 32.6 kg/m.  Advanced Directives 11/18/2017 02/18/2017 11/16/2016 11/15/2015 11/10/2015 11/09/2014 10/12/2014  Does Patient Have a Medical Advance Directive? _1  Yes Yes  Type of AParamedicof ARoyLiving will HHuntsvilleLiving will HHammondLiving will HVillasLiving will HSouthchaseLiving will HBancroftLiving will HMallardLiving will  Does patient want to make changes to medical advance directive? No - Patient declined - - No - Patient declined - - No - Patient declined  Copy of [place]in Chart? Yes No - copy requested - Yes No - copy requested No - copy requested No - copy requested  Pre-existing out of facility DNR order (yellow form or pink MOST form) - - - - - - -    Tobacco Social History   Tobacco Use  Smoking Status Former Smoker  . Packs/day: 1.00  . Years: 15.00  . Pack years: 15.00  . Last attempt  to quit: 07/31/1979  . Years since quitting: 38.3  Smokeless Tobacco Never Used     Counseling given: Not Answered   Clinical Intake: Pain : No/denies pain      Past Medical History:  Diagnosis Date  . Allergic state 06/26/2015  . Amnesia 2009   isolated;for 30 mins w/elevated BP  . Arthritis   . Cancer ([place]    skin / top head  . Cellulitis 2011   LUE (no PMH of MRSA)/left elbow  . Diverticulosis   . Diverticulosis of colon   . GERD (gastroesophageal reflux disease)    OCC HEARTBURN  . Gilbert's syndrome   . Hematuria 2009   Dr PBurnell Blanks . Hyperglycemia   . Hyperlipidemia   . Hypertension   . Lung cancer, lower lobe (HCC)    Stage IIA (T1,N1)  . Neuromuscular disorder ([place]    2010 TEMPORARY MEMORY LOSS 1-1.5 HR WAS CHECKED OUT AT [facility] , NOTHING FOUND  . Peptic ulcer 1995  . Peyronie disease   . Preventative health care 12/19/2015  . [place] 11/[place]  . Recurrent upper respiratory infection (URI)    09/2011  BRONCHITIS  . SCC (squamous cell carcinoma) 01/21/2009   Qualifier: Diagnosis of  By: HLinna DarnerMD, WGwyndolyn Saxon  ? Basal Cell     Past Surgical History:  Procedure Laterality Date  . chemo treatment  4 treatments that last 7 hours each/last treatmebt in 2013  . COLONOSCOPY W/ POLYPECTOMY  2005   due 2015  . CYSTOSCOPY  2009  . LOBECTOMY  11/02/2011   Procedure: LOBECTOMY;  Surgeon: Melrose Nakayama, MD;  Location: [facility];  Service: Thoracic;  Laterality: Left;  LEFT LOWER LOBECTOMY  . REFRACTIVE SURGERY     Bil  . SKIN BIOPSY     4 0r 5  . Squamous Cell Cancer Excision  12/13/2016  . SUPRACLAVICAL NODE BIOPSY Left 10/15/2014   Procedure: LEFT SUPRACLAVICAL LYMPH NODE BIOPSY;  Surgeon: Melrose Nakayama, MD;  Location: [facility];  Service: Thoracic;  Laterality: Left;  LEFT SUPRACLAVICAL LYMPH NODE BIOPSY  . VASECTOMY    . VEIN SURGERY     R ankle   Family History  Problem Relation Age of Onset  .  Stroke Mother        in 38s  . Hypertension Mother   . Breast cancer Mother   . Neuropathy Mother   . Melanoma Father   . Heart attack Father 66       stents  . Dementia Father   . Hypertension Sister   . Thyroid disease Sister        ??  . Multiple myeloma Sister   . Cancer Sister   . Stomach cancer Maternal Grandfather   . Lung cancer Maternal Grandfather   . Cancer Paternal Grandmother        lung, nonsmoker  . Headache Daughter   . Diabetes Paternal Aunt    Social History   Socioeconomic History  . Marital status: Married    Spouse name: Not on file  . Number of children: Not on file  . Years of education: Not on file  . Highest education level: Not on file  Occupational History  . Occupation: retired  Scientific laboratory technician  . Financial resource strain: Not on file  . Food insecurity:    Worry: Not on file    Inability: Not on file  . Transportation needs:    Medical: Not on file    Non-medical: Not on file  Tobacco Use  . Smoking status: Former Smoker    Packs/day: 1.00    Years: 15.00    Pack years: 15.00    Last attempt to quit: 07/31/1979    Years since quitting: 38.3  . Smokeless tobacco: Never Used  Substance and Sexual Activity  . Alcohol use: Yes    Comment: Daily 3 glasses of vodka  . Drug use: No  . Sexual activity: Not Currently    Comment: lives with wife, retired from [employer], ran distribution. No dietary restrictions.  Lifestyle  . Physical activity:    Days per week: Not on file    Minutes per session: Not on file  . Stress: Not on file  Relationships  . Social connections:    Talks on phone: Not on file    Gets together: Not on file    Attends religious service: Not on file    Active member of club or organization: Not on file    Attends meetings of clubs or organizations: Not on file    Relationship status: Not on file  Other Topics Concern  . Not on file  Social History Narrative   Reg exercise    Outpatient Encounter  Medications as of 11/18/2017  Medication Sig  . CARTIA XT 240 MG 24 hr capsule TAKE 1 CAPSULE EVERY DAY  . chlorthalidone (HYGROTON)  25 MG tablet Take 1 tablet (25 mg total) by mouth daily.  . clobetasol cream (TEMOVATE) 0.05 %   . losartan (COZAAR) 100 MG tablet TAKE 1/2 TABLET (50MG) EVERY DAY  . metoprolol tartrate (LOPRESSOR) 50 MG tablet TAKE 1 AND 1/2 TABLETS TWICE DAILY   No facility-administered encounter medications on file as of 11/18/2017.     Activities of Daily Living In your present state of health, do you have any difficulty performing the following activities: 11/18/2017  Hearing? N  Vision? N  Comment Dr.Miller yearly. Wears contacts  Difficulty concentrating or making decisions? N  Walking or climbing stairs? N  Dressing or bathing? N  Doing errands, shopping? N  Preparing Food and eating ? N  Using the Toilet? N  In the past six months, have you accidently leaked urine? N  Comment follows with nephrology  Do you have problems with loss of bowel control? N  Managing your Medications? N  Managing your Finances? N  Housekeeping or managing your Housekeeping? N  Some recent data might be hidden    Patient Care Team: [name], MD as PCP - General (Family Medicine) Kris Mouton Chauncy Passy, MD as Referring Physician (Nephrology) Curt Bears, MD as Consulting Physician (Oncology) Geri Seminole, MD as Referring Physician (Dermatopathology) Marica Otter, OD as Consulting Physician (Optometry)   Assessment:   This is a routine wellness examination for Justinn. Physical assessment deferred to PCP.   Exercise Activities and Dietary recommendations Current Exercise Habits: Home exercise routine, Type of exercise: treadmill, Time (Minutes): 35, Frequency (Times/Week): 2, Weekly Exercise (Minutes/Week): 70, Intensity: Mild   Diet (meal preparation, eat out, water intake, caffeinated beverages, dairy products, fruits and vegetables): in general, an "unhealthy"  diet   Goals    . Weight (lb) < 215 lb (97.5 kg)       Fall Risk Fall Risk  11/18/2017 11/16/2016 11/15/2015 08/05/2014 03/16/2014  Falls in the past year? _0      Depression Screen PHQ 2/9 Scores 11/18/2017 11/16/2016 11/15/2015 08/05/2014  PHQ - 2 Score 0 0 0 0    Cognitive Function MMSE - Mini Mental State Exam 11/18/2017 11/16/2016 11/15/2015  Orientation to time _1 Orientation to Place _2 Registration _3 Attention/ Calculation _4 Recall _5 Language- name 2 objects _6 Language- repeat _7 Language- follow 3 step command _8 Language- read & follow direction _9 Write a sentence _10 Copy design _11 Total score _12 Immunization History  Administered Date(s) Administered  . Influenza,inj,Quad PF,6+ Mos 06/17/2015  . Pneumococcal Conjugate-13 12/13/2014  . Pneumococcal Polysaccharide-23 02/01/2010  . Td 09/27/2001  . Tdap 09/04/2012    Screening Tests Health Maintenance  Topic Date Due  . INFLUENZA VACCINE  02/27/2018  . COLONOSCOPY  10/27/2018  . TETANUS/TDAP  09/04/2022  . Hepatitis C Screening  Completed  . PNA vac Low Risk Adult  Completed      Plan:   Follow up with Dr.Blyth as scheduled 01/13/18.  Continue to eat heart healthy diet (full of fruits, vegetables, whole grains, lean protein, water--limit salt, fat, and sugar intake) and increase physical activity as tolerated.  Continue doing brain stimulating activities (puzzles, reading, adult coloring books, staying active) to keep memory sharp.     I have personally  reviewed and noted the following in the patient's chart:   . Medical and social history . Use of alcohol, tobacco or illicit drugs  . Current medications and supplements . Functional ability and status . Nutritional status . Physical activity . Advanced directives . List of other physicians . Hospitalizations, surgeries, and ER visits in previous 12  months . Vitals . Screenings to include cognitive, depression, and falls . Referrals and appointments  In addition, I have reviewed and discussed with patient certain preventive protocols, quality metrics, and best practice recommendations. A written personalized care plan for preventive services as well as general preventive health recommendations were provided to patient.     Naaman Plummer [place], [place]  11/18/2017

## 2017-11-18 ENCOUNTER — Ambulatory Visit: Payer: Medicare HMO | Admitting: *Deleted

## 2017-11-18 ENCOUNTER — Ambulatory Visit (INDEPENDENT_AMBULATORY_CARE_PROVIDER_SITE_OTHER): Payer: Medicare HMO | Admitting: *Deleted

## 2017-11-18 ENCOUNTER — Encounter: Payer: Self-pay | Admitting: *Deleted

## 2017-11-18 VITALS — BP 140/76 | HR 61 | Ht 70.0 in | Wt 227.2 lb

## 2017-11-18 DIAGNOSIS — Z0000 Encounter for general adult medical examination without abnormal findings: Secondary | ICD-10-CM

## 2017-11-18 NOTE — Progress Notes (Signed)
Reviewed  Dorella Laster R Lowne Chase, DO  

## 2017-11-18 NOTE — Patient Instructions (Signed)
Follow up with Dr.Blyth as scheduled 01/13/18.  Continue to eat heart healthy diet (full of fruits, vegetables, whole grains, lean protein, water--limit salt, fat, and sugar intake) and increase physical activity as tolerated.  Continue doing brain stimulating activities (puzzles, reading, adult coloring books, staying active) to keep memory sharp.    Kyle Long , Thank you for taking time to come for your Medicare Wellness Visit. I appreciate your ongoing commitment to your health goals. Please review the following plan we discussed and let me know if I can assist you in the future.   These are the goals we discussed: Goals    . Weight (lb) < 215 lb (97.5 kg)       This is a list of the screening recommended for you and due dates:  Health Maintenance  Topic Date Due  . Flu Shot  02/27/2018  . Colon Cancer Screening  10/27/2018  . Tetanus Vaccine  09/04/2022  .  Hepatitis C: One time screening is recommended by Center for Disease Control  (CDC) for  adults born from [birth year] through 1967.   Completed  . Pneumonia vaccines  Completed    Health Maintenance, Male A healthy lifestyle and preventive care is important for your health and wellness. Ask your health care provider about what schedule of regular examinations is right for you. What should I know about weight and diet? Eat a Healthy Diet  Eat plenty of vegetables, fruits, whole grains, low-fat dairy products, and lean protein.  Do not eat a lot of foods high in solid fats, added sugars, or salt.  Maintain a Healthy Weight Regular exercise can help you achieve or maintain a healthy weight. You should:  Do at least 150 minutes of exercise each week. The exercise should increase your heart rate and make you sweat (moderate-intensity exercise).  Do strength-training exercises at least twice a week.  Watch Your Levels of Cholesterol and Blood Lipids  Have your blood tested for lipids and cholesterol every 5 years starting at  74 years of age. If you are at high risk for heart disease, you should start having your blood tested when you are 74 years old. You may need to have your cholesterol levels checked more often if: ? Your lipid or cholesterol levels are high. ? You are older than 74 years of age. ? You are at high risk for heart disease.  What should I know about cancer screening? Many types of cancers can be detected early and may often be prevented. Lung Cancer  You should be screened every year for lung cancer if: ? You are a current smoker who has smoked for at least 30 years. ? You are a former smoker who has quit within the past 15 years.  Talk to your health care provider about your screening options, when you should start screening, and how often you should be screened.  Colorectal Cancer  Routine colorectal cancer screening usually begins at 74 years of age and should be repeated every 5-10 years until you are 74 years old. You may need to be screened more often if early forms of precancerous polyps or small growths are found. Your health care provider may recommend screening at an earlier age if you have risk factors for colon cancer.  Your health care provider may recommend using home test kits to check for hidden blood in the stool.  A small camera at the end of a tube can be used to examine your colon (sigmoidoscopy or colonoscopy).  This checks for the earliest forms of colorectal cancer.  Prostate and Testicular Cancer  Depending on your age and overall health, your health care provider may do certain tests to screen for prostate and testicular cancer.  Talk to your health care provider about any symptoms or concerns you have about testicular or prostate cancer.  Skin Cancer  Check your skin from head to toe regularly.  Tell your health care provider about any new moles or changes in moles, especially if: ? There is a change in a mole's size, shape, or color. ? You have a mole that is  larger than a pencil eraser.  Always use sunscreen. Apply sunscreen liberally and repeat throughout the day.  Protect yourself by wearing long sleeves, pants, a wide-brimmed hat, and sunglasses when outside.  What should I know about heart disease, diabetes, and high blood pressure?  If you are 27-23 years of age, have your blood pressure checked every 3-5 years. If you are 67 years of age or older, have your blood pressure checked every year. You should have your blood pressure measured twice-once when you are at a hospital or clinic, and once when you are not at a hospital or clinic. Record the average of the two measurements. To check your blood pressure when you are not at a hospital or clinic, you can use: ? An automated blood pressure machine at a pharmacy. ? A home blood pressure monitor.  Talk to your health care provider about your target blood pressure.  If you are between 51-50 years old, ask your health care provider if you should take aspirin to prevent heart disease.  Have regular diabetes screenings by checking your fasting blood sugar level. ? If you are at a normal weight and have a low risk for diabetes, have this test once every three years after the age of 57. ? If you are overweight and have a high risk for diabetes, consider being tested at a younger age or more often.  A one-time screening for abdominal aortic aneurysm (AAA) by ultrasound is recommended for men aged 58-75 years who are current or former smokers. What should I know about preventing infection? Hepatitis B If you have a higher risk for hepatitis B, you should be screened for this virus. Talk with your health care provider to find out if you are at risk for hepatitis B infection. Hepatitis C Blood testing is recommended for:  Everyone born from [birth year] through 1967.  Anyone with known risk factors for hepatitis C.  Sexually Transmitted Diseases (STDs)  You should be screened each year for STDs  including gonorrhea and chlamydia if: ? You are sexually active and are younger than 74 years of age. ? You are older than 74 years of age and your health care provider tells you that you are at risk for this type of infection. ? Your sexual activity has changed since you were last screened and you are at an increased risk for chlamydia or gonorrhea. Ask your health care provider if you are at risk.  Talk with your health care provider about whether you are at high risk of being infected with HIV. Your health care provider may recommend a prescription medicine to help prevent HIV infection.  What else can I do?  Schedule regular health, dental, and eye exams.  Stay current with your vaccines (immunizations).  Do not use any tobacco products, such as cigarettes, chewing tobacco, and e-cigarettes. If you need help quitting, ask your health care  provider.  Limit alcohol intake to no more than 2 drinks per day. One drink equals 12 ounces of beer, 5 ounces of wine, or 1 ounces of hard liquor.  Do not use street drugs.  Do not share needles.  Ask your health care provider for help if you need support or information about quitting drugs.  Tell your health care provider if you often feel depressed.  Tell your health care provider if you have ever been abused or do not feel safe at home. This information is not intended to replace advice given to you by your health care provider. Make sure you discuss any questions you have with your health care provider. Document Released: 01/12/2008 Document Revised: 03/14/2016 Document Reviewed: 04/19/2015 Elsevier Interactive Patient Education  [employer].

## 2017-12-06 ENCOUNTER — Other Ambulatory Visit: Payer: Self-pay | Admitting: Family Medicine

## 2018-01-10 ENCOUNTER — Other Ambulatory Visit: Payer: Self-pay | Admitting: Family Medicine

## 2018-01-13 ENCOUNTER — Encounter: Payer: Medicare HMO | Admitting: Family Medicine

## 2018-01-14 ENCOUNTER — Other Ambulatory Visit: Payer: Self-pay | Admitting: Family Medicine

## 2018-02-18 ENCOUNTER — Other Ambulatory Visit: Payer: Self-pay | Admitting: Family Medicine

## 2018-02-20 ENCOUNTER — Encounter: Payer: Self-pay | Admitting: Internal Medicine

## 2018-02-24 ENCOUNTER — Ambulatory Visit (HOSPITAL_COMMUNITY)
Admission: RE | Admit: 2018-02-24 | Discharge: 2018-02-24 | Disposition: A | Discharge status: Home / Self Care | Payer: Medicare HMO | Source: Ambulatory Visit | Attending: Internal Medicine | Admitting: Internal Medicine

## 2018-02-24 ENCOUNTER — Inpatient Hospital Stay: Payer: Medicare HMO | Attending: Internal Medicine

## 2018-02-24 DIAGNOSIS — C3432 Malignant neoplasm of lower lobe, left bronchus or lung: Secondary | ICD-10-CM | POA: Diagnosis not present

## 2018-02-24 DIAGNOSIS — C3490 Malignant neoplasm of unspecified part of unspecified bronchus or lung: Secondary | ICD-10-CM | POA: Diagnosis not present

## 2018-02-24 DIAGNOSIS — K5730 Diverticulosis of large intestine without perforation or abscess without bleeding: Secondary | ICD-10-CM | POA: Diagnosis not present

## 2018-02-24 DIAGNOSIS — Z79899 Other long term (current) drug therapy: Secondary | ICD-10-CM | POA: Diagnosis not present

## 2018-02-24 DIAGNOSIS — I10 Essential (primary) hypertension: Secondary | ICD-10-CM | POA: Diagnosis not present

## 2018-02-24 DIAGNOSIS — R911 Solitary pulmonary nodule: Secondary | ICD-10-CM | POA: Diagnosis not present

## 2018-02-24 DIAGNOSIS — C8301 Small cell B-cell lymphoma, lymph nodes of head, face, and neck: Secondary | ICD-10-CM | POA: Insufficient documentation

## 2018-02-24 LAB — CMP (CANCER CENTER ONLY)
ALBUMIN: 4 g/dL (ref 3.5–5.0)
ALT: 39 U/L (ref 0–44)
ANION GAP: 9 (ref 5–15)
AST: 29 U/L (ref 15–41)
Alkaline Phosphatase: 49 U/L (ref 38–126)
BILIRUBIN TOTAL: 0.8 mg/dL (ref 0.3–1.2)
BUN: 19 mg/dL (ref 8–23)
CALCIUM: 9.4 mg/dL (ref 8.9–10.3)
CO2: 30 mmol/L (ref 22–32)
Chloride: 99 mmol/L (ref 98–111)
Creatinine: 1.52 mg/dL — ABNORMAL HIGH (ref 0.61–1.24)
GFR, EST AFRICAN AMERICAN: 51 mL/min — AB (ref >60)
GFR, Estimated: 44 mL/min — ABNORMAL LOW (ref >60)
Glucose, Bld: 101 mg/dL — ABNORMAL HIGH (ref 70–99)
POTASSIUM: 3.8 mmol/L (ref 3.5–5.1)
Sodium: 138 mmol/L (ref 135–145)
Total Protein: 7.3 g/dL (ref 6.5–8.1)

## 2018-02-24 LAB — CBC WITH DIFFERENTIAL (CANCER CENTER ONLY)
BASOS PCT: 1 %
Basophils Absolute: 0.1 10*3/uL (ref 0.0–0.1)
EOS ABS: 0.1 10*3/uL (ref 0.0–0.5)
EOS PCT: 1 %
HCT: 40 % (ref 38.4–49.9)
Hemoglobin: 13.8 g/dL (ref 13.0–17.1)
LYMPHS ABS: 3.3 10*3/uL (ref 0.9–3.3)
Lymphocytes Relative: 36 %
MCH: 33.3 pg (ref 27.2–33.4)
MCHC: 34.5 g/dL (ref 32.0–36.0)
MCV: 96.5 fL (ref 79.3–98.0)
MONOS PCT: 10 %
Monocytes Absolute: 0.9 10*3/uL (ref 0.1–0.9)
Neutro Abs: 4.7 10*3/uL (ref 1.5–6.5)
Neutrophils Relative %: 52 %
PLATELETS: 193 10*3/uL (ref 140–400)
RBC: 4.14 MIL/uL — ABNORMAL LOW (ref 4.20–5.82)
RDW: 12.7 % (ref 11.0–14.6)
WBC Count: 9 10*3/uL (ref 4.0–10.3)

## 2018-02-24 MED ORDER — IOPAMIDOL (ISOVUE-300) INJECTION 61%
INTRAVENOUS | Status: AC
  Filled 2018-02-24: qty 100

## 2018-02-24 MED ORDER — IOPAMIDOL (ISOVUE-300) INJECTION 61%
100.0000 mL | Freq: Once | INTRAVENOUS | Status: AC | PRN
  Administered 2018-02-24: 80 mL via INTRAVENOUS

## 2018-02-26 ENCOUNTER — Telehealth: Payer: Self-pay | Admitting: Internal Medicine

## 2018-02-26 ENCOUNTER — Encounter: Payer: Self-pay | Admitting: Internal Medicine

## 2018-02-26 ENCOUNTER — Inpatient Hospital Stay: Payer: Medicare HMO | Admitting: Internal Medicine

## 2018-02-26 VITALS — BP 142/67 | HR 65 | Temp 98.6°F | Resp 18 | Ht 70.0 in | Wt 230.5 lb

## 2018-02-26 DIAGNOSIS — I10 Essential (primary) hypertension: Secondary | ICD-10-CM

## 2018-02-26 DIAGNOSIS — C4492 Squamous cell carcinoma of skin, unspecified: Secondary | ICD-10-CM

## 2018-02-26 DIAGNOSIS — C3432 Malignant neoplasm of lower lobe, left bronchus or lung: Secondary | ICD-10-CM

## 2018-02-26 DIAGNOSIS — C8301 Small cell B-cell lymphoma, lymph nodes of head, face, and neck: Secondary | ICD-10-CM | POA: Diagnosis not present

## 2018-02-26 DIAGNOSIS — C8300 Small cell B-cell lymphoma, unspecified site: Secondary | ICD-10-CM

## 2018-02-26 DIAGNOSIS — C3490 Malignant neoplasm of unspecified part of unspecified bronchus or lung: Secondary | ICD-10-CM

## 2018-02-26 DIAGNOSIS — Z79899 Other long term (current) drug therapy: Secondary | ICD-10-CM | POA: Diagnosis not present

## 2018-02-26 NOTE — Telephone Encounter (Signed)
Appointments scheduled AVS/Calendar printed per 7/31 los

## 2018-02-26 NOTE — Progress Notes (Signed)
[place] [phone]   [phone]  OFFICE PROGRESS NOTE  [name], MD [place] [id number]  DIAGNOSIS AND STAGE:  1) small lymphocytic lymphoma involving the left supraclavicular lymph nodes diagnosed in February 2016. 2) Stage IIA (T1a, N1, M0) non-small cell lung cancer consistent with adenocarcinoma with negative EGFR mutation diagnosed in March of 2013.   PRIOR THERAPY:  1) S/P left video-assisted thoracoscopy, wedge resection of left lower lobe nodule, thoracoscopic left lower lobectomy, mediastinal lymph node dissection on 11/02/2011.  2) Adjuvant chemotherapy with cisplatin 75 mg/M2 and Alimta 500 mg/M2 every 3 weeks. She is status post 4 cycles.   CURRENT THERAPY: Observation.  INTERVAL HISTORY: Kyle Long 72 y.o. male returns to the clinic today for six-month follow-up visit.  The patient is feeling fine today with no concerning complaints.  He denied having any recent weight loss or night sweats.  He has no nausea, vomiting, diarrhea or constipation.  He has no chest pain, shortness of breath, cough or hemoptysis.  Has been in observation and doing fine.  He had repeat CT scan of the chest, abdomen and pelvis performed recently and he is here for evaluation and discussion of his discuss results.  MEDICAL HISTORY: Past Medical History:  Diagnosis Date  . Allergic state 06/26/2015  . Amnesia 2009   isolated;for 30 mins w/elevated BP  . Arthritis   . Cancer ([facility])    skin / top head  . Cellulitis 2011   LUE (no PMH of MRSA)/left elbow  . Diverticulosis   . Diverticulosis of colon   . GERD (gastroesophageal reflux disease)    OCC HEARTBURN  . Gilbert's syndrome   . Hematuria 2009   Dr Burnell Blanks  . Hyperglycemia   . Hyperlipidemia   . Hypertension   . Lung cancer, lower lobe (HCC)    Stage IIA (T1,N1)  . Neuromuscular disorder ([place])    2010 TEMPORARY MEMORY LOSS 1-1.5 HR WAS  CHECKED OUT AT [facility] , NOTHING FOUND  . Peptic ulcer 1995  . Peyronie disease   . Preventative health care 12/19/2015  . Primary cancer of left lower lobe of lung ([facility]) 11/27/2011  . Recurrent upper respiratory infection (URI)    09/2011  BRONCHITIS  . SCC (squamous cell carcinoma) 01/21/2009   Qualifier: Diagnosis of  By: Linna Darner MD, Gwyndolyn Saxon   ? Basal Cell      ALLERGIES:  is allergic to lisinopril.  MEDICATIONS:  Current Outpatient Medications  Medication Sig Dispense Refill  . CARTIA XT 240 MG 24 hr capsule TAKE 1 CAPSULE EVERY DAY 90 capsule 0  . chlorthalidone (HYGROTON) 25 MG tablet TAKE 1 TABLET EVERY DAY 90 tablet 0  . clobetasol cream (TEMOVATE) 0.05 %   3  . losartan (COZAAR) 100 MG tablet TAKE 1/2 TABLET EVERY DAY 45 tablet 3  . metoprolol tartrate (LOPRESSOR) 50 MG tablet Take 1.5 tablets (75 mg total) by mouth 2 (two) times daily. 90 tablet 0   No current facility-administered medications for this visit.     SURGICAL HISTORY:  Past Surgical History:  Procedure Laterality Date  . chemo treatment      4 treatments that last 7 hours each/last treatmebt in 2013  . COLONOSCOPY W/ POLYPECTOMY  2005   due 2015  . CYSTOSCOPY  2009  . LOBECTOMY  11/02/2011   Procedure: LOBECTOMY;  Surgeon: Melrose Nakayama, MD;  Location: [facility];  Service: Thoracic;  Laterality: Left;  LEFT LOWER LOBECTOMY  . REFRACTIVE SURGERY     Bil  . SKIN BIOPSY     4 0r 5  . Squamous Cell Cancer Excision  12/13/2016  . SUPRACLAVICAL NODE BIOPSY Left 10/15/2014   Procedure: LEFT SUPRACLAVICAL LYMPH NODE BIOPSY;  Surgeon: Melrose Nakayama, MD;  Location: [facility];  Service: Thoracic;  Laterality: Left;  LEFT SUPRACLAVICAL LYMPH NODE BIOPSY  . VASECTOMY    . VEIN SURGERY     R ankle    REVIEW OF SYSTEMS:  A comprehensive review of systems was negative.   PHYSICAL EXAMINATION: General appearance: alert, cooperative and no distress Head: Normocephalic, without obvious abnormality,  atraumatic Neck: no adenopathy, no JVD, supple, symmetrical, trachea midline and thyroid not enlarged, symmetric, no tenderness/mass/nodules Lymph nodes: Cervical, supraclavicular, and axillary nodes normal. Resp: clear to auscultation bilaterally Back: symmetric, no curvature. ROM normal. No CVA tenderness. Cardio: regular rate and rhythm, S1, S2 normal, no murmur, click, rub or gallop GI: soft, non-tender; bowel sounds normal; no masses,  no organomegaly Extremities: extremities normal, atraumatic, no cyanosis or edema  ECOG PERFORMANCE STATUS: 1 - Symptomatic but completely ambulatory  Blood pressure (!) 142/67, pulse 65, temperature 98.6 F (37 C), temperature source Oral, resp. rate 18, height _0  (1.778 m), weight 230 lb 8 oz (104.6 kg), SpO2 100 %.  LABORATORY DATA: Lab Results  Component Value Date   WBC 9.0 02/24/2018   HGB 13.8 02/24/2018   HCT 40.0 02/24/2018   MCV 96.5 02/24/2018   PLT 193 02/24/2018      Chemistry      Component Value Date/Time   NA 138 02/24/2018 1115   NA 137 02/15/2017 1053   K 3.8 02/24/2018 1115   K 4.4 02/15/2017 1053   CL 99 02/24/2018 1115   CL 104 09/03/2012 1003   CO2 30 02/24/2018 1115   CO2 25 02/15/2017 1053   BUN 19 02/24/2018 1115   BUN 27.1 (H) 02/15/2017 1053   CREATININE 1.52 (H) 02/24/2018 1115   CREATININE 1.4 (H) 02/15/2017 1053      Component Value Date/Time   CALCIUM 9.4 02/24/2018 1115   CALCIUM 10.0 02/15/2017 1053   ALKPHOS 49 02/24/2018 1115   ALKPHOS 51 02/15/2017 1053   AST 29 02/24/2018 1115   AST 30 02/15/2017 1053   ALT 39 02/24/2018 1115   ALT 37 02/15/2017 1053   BILITOT 0.8 02/24/2018 1115   BILITOT 0.93 02/15/2017 1053       RADIOGRAPHIC STUDIES: Ct Chest W Contrast  Result Date: 02/24/2018 CLINICAL DATA:  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 Additional history of stage IIA non-small cell lung cancer diagnosed 2013. EXAM: CT CHEST, ABDOMEN, AND  PELVIS WITH CONTRAST TECHNIQUE: Multidetector CT imaging of the chest, abdomen and pelvis was performed following the standard protocol during bolus administration of intravenous contrast. CONTRAST:  27m ISOVUE-300 IOPAMIDOL (ISOVUE-300) INJECTION 61% COMPARISON:  CT 11/12/2016 FINDINGS: CT CHEST FINDINGS Cardiovascular: No significant vascular findings. Normal heart size. No pericardial effusion. Mediastinum/Nodes: No axillary supraclavicular adenopathy. No mediastinal hilar adenopathy. No pericardial effusion. Esophagus normal. Lungs/Pleura: volume loss in the LEFT hemithorax following LEFT lower lobectomy. No nodularity in the LEFT lung. The RIGHT lung is hyperexpanded. Ground-glass nodule in the RIGHT upper lobe measures 9 mm by 9 mm (image 63/6) compared to 8 mm x 6 mm on chest CT 11/12/2016. Similar size on 11/07/2015. Benign-appearing intrafissural nodule RIGHT middle lobe unchanged. Musculoskeletal: No aggressive  osseous lesion. CT ABDOMEN AND PELVIS FINDINGS Hepatobiliary: Low-density lesions in the LEFT hepatic lobe unchanged consistent benign hepatic cysts. A small cystic lesions in the RIGHT hepatic lobe are also unchanged no new hepatic lesions. No biliary duct dilatation. Pancreas: Pancreas is normal. No ductal dilatation. No pancreatic inflammation. Spleen: Normal spleen Adrenals/urinary tract: Adrenal glands normal. Kidneys, ureters and bladder normal. Stomach/Bowel: Small diverticulum from fundus of the stomach. Duodenum small-bowel normal. Appendix cecum normal. Diverticula scattered throughout the colon without acute inflammation. Vascular/Lymphatic: Abdominal aorta is normal caliber with atherosclerotic calcification. There is no retroperitoneal or periportal lymphadenopathy. No pelvic lymphadenopathy. 8 mm RIGHT common iliac node is unchanged. Reproductive: Prostate enlarged. Other: No free fluid. Musculoskeletal: No aggressive osseous lesion. IMPRESSION: Chest Impression: 1. No evidence of  lymphoma recurrence in thorax. 2. Stable postoperative change in the LEFT hemithorax. 3. Ground-glass nodule in the RIGHT upper lobe is relatively stable over comparison exams however does have suspicious imaging characteristics concerning for adenomatous carcinoma in situ. Recommend continued CT surveillance. Abdomen / Pelvis Impression: 1. No lymphadenopathy in the abdomen pelvis. 2. Extensive colon diverticulosis. Electronically Signed   By: Suzy Bouchard M.D.   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 Additional history of stage IIA non-small cell lung cancer diagnosed 2013. EXAM: CT CHEST, ABDOMEN, AND PELVIS WITH CONTRAST TECHNIQUE: Multidetector CT imaging of the chest, abdomen and pelvis was performed following the standard protocol during bolus administration of intravenous contrast. CONTRAST:  85m ISOVUE-300 IOPAMIDOL (ISOVUE-300) INJECTION 61% COMPARISON:  CT 11/12/2016 FINDINGS: CT CHEST FINDINGS Cardiovascular: No significant vascular findings. Normal heart size. No pericardial effusion. Mediastinum/Nodes: No axillary supraclavicular adenopathy. No mediastinal hilar adenopathy. No pericardial effusion. Esophagus normal. Lungs/Pleura: volume loss in the LEFT hemithorax following LEFT lower lobectomy. No nodularity in the LEFT lung. The RIGHT lung is hyperexpanded. Ground-glass nodule in the RIGHT upper lobe measures 9 mm by 9 mm (image 63/6) compared to 8 mm x 6 mm on chest CT 11/12/2016. Similar size on 11/07/2015. Benign-appearing intrafissural nodule RIGHT middle lobe unchanged. Musculoskeletal: No aggressive osseous lesion. CT ABDOMEN AND PELVIS FINDINGS Hepatobiliary: Low-density lesions in the LEFT hepatic lobe unchanged consistent benign hepatic cysts. A small cystic lesions in the RIGHT hepatic lobe are also unchanged no new hepatic lesions. No biliary  duct dilatation. Pancreas: Pancreas is normal. No ductal dilatation. No pancreatic inflammation. Spleen: Normal spleen Adrenals/urinary tract: Adrenal glands normal. Kidneys, ureters and bladder normal. Stomach/Bowel: Small diverticulum from fundus of the stomach. Duodenum small-bowel normal. Appendix cecum normal. Diverticula scattered throughout the colon without acute inflammation. Vascular/Lymphatic: Abdominal aorta is normal caliber with atherosclerotic calcification. There is no retroperitoneal or periportal lymphadenopathy. No pelvic lymphadenopathy. 8 mm RIGHT common iliac node is unchanged. Reproductive: Prostate enlarged. Other: No free fluid. Musculoskeletal: No aggressive osseous lesion. IMPRESSION: Chest Impression: 1. No evidence of lymphoma recurrence in thorax. 2. Stable postoperative change in the LEFT hemithorax. 3. Ground-glass nodule in the RIGHT upper lobe is relatively stable over comparison exams however does have suspicious imaging characteristics concerning for adenomatous carcinoma in situ. Recommend continued CT surveillance. Abdomen / Pelvis Impression: 1. No lymphadenopathy in the abdomen pelvis. 2. Extensive colon diverticulosis. Electronically Signed   By: SSuzy BouchardM.D.   On: 02/24/2018 16:11    ASSESSMENT AND PLAN: This is a very pleasant 72years old white male with: 1) stage IIA non-small cell lung cancer, adenocarcinoma status  post wedge resection of the left lower lobe with lymph node dissection in April 2013 followed by adjuvant systemic chemotherapy with cisplatin and Alimta.  The patient is currently on observation and he is doing fine. Recent CT scan of the chest showed no concerning findings for disease progression. 2) small lymphocytic lymphoma: He will continue on observation for now. CT scan of the abdomen pelvis showed no concerning findings for progression in the abdominal pelvis and no lymphadenopathy. I recommended for the patient to continue on  observation with repeat CT scan of the chest, abdomen and pelvis in 1 year for evaluation of his lung cancer and lymphoma. He was advised to call immediately if he has any concerning symptoms in the interval. The patient voices understanding of current disease status and treatment options and is in agreement with the current care plan. All questions were answered. The patient knows to call the clinic with any problems, questions or concerns. We can certainly see the patient much sooner if necessary.  Disclaimer: This note was dictated with voice recognition software. Similar sounding words can inadvertently be transcribed and may not be corrected upon review.

## 2018-02-28 ENCOUNTER — Other Ambulatory Visit: Payer: Self-pay

## 2018-02-28 NOTE — Telephone Encounter (Signed)
Received fax from pharmacy requesting refill on on patients metoprolol. Medication was just refilled on 02/18/18. Refill not appropriate via fax. Fax has been shredded.

## 2018-03-06 ENCOUNTER — Telehealth: Payer: Self-pay | Admitting: *Deleted

## 2018-03-06 MED ORDER — METOPROLOL TARTRATE 50 MG PO TABS: 75.0000 mg | ORAL_TABLET | Freq: Two times a day (BID) | ORAL | 0 refills | Status: DC

## 2018-03-14 NOTE — Telephone Encounter (Signed)
Pt states that the last two refills of this medication have been called in as 30 day supplies and because it is being called in that way to mail order pharmacy pt is being charged more for the medication.  Pt wants to know if this can be changed to a 90 day supply. Pt uses  [place], [place] [phone] (Phone) [phone] (Fax)   Pt can be reached at [phone]

## 2018-03-17 NOTE — Telephone Encounter (Signed)
Medications have been called in as 90 day supply

## 2018-03-27 ENCOUNTER — Other Ambulatory Visit: Payer: Self-pay | Admitting: Family Medicine

## 2018-04-22 IMAGING — CT CT CHEST W/ CM
2 of 4 series · 15 of 36 positions shown, 18 images · IV contrast (ISOVUE 300)
Comparison: 02/15/2017 and earlier studies dating back to
06/03/2012

CLINICAL DATA: Followup small lymphocytic lymphoma and left lower
lobe lung carcinoma. Status post chemotherapy.

EXAM:
CT CHEST WITH CONTRAST
TECHNIQUE: Multidetector CT imaging of the chest was performed during
intravenous contrast administration.
CONTRAST:  75mL [id number] IOPAMIDOL ([id number]) INJECTION 61%

[Series 2: axial st · axial · 0.87mm/px · z∈[-364,-38]mm · 12 of 191 slices shown, 15 images]
[im 14/191  mediastinal]
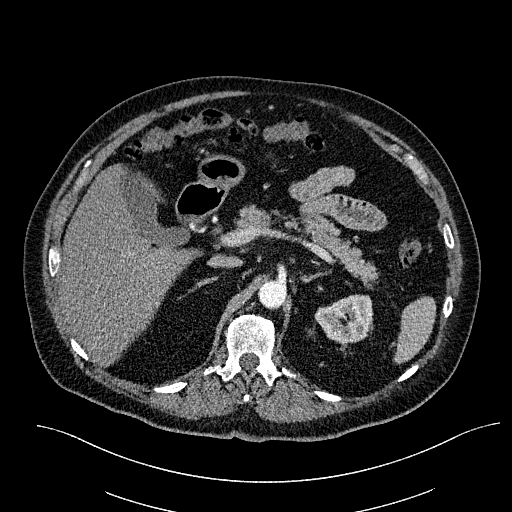
[im 14/191  lung]
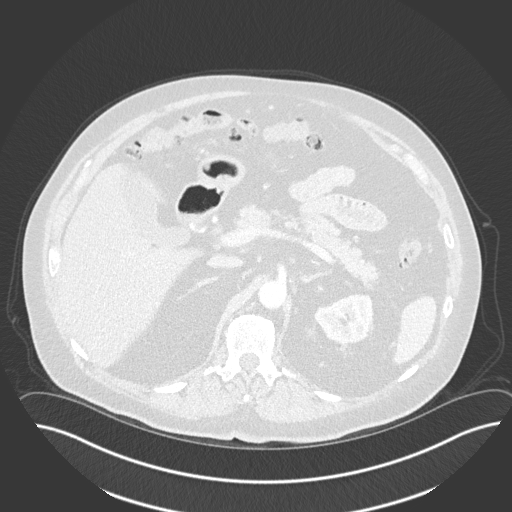
[im 28/191  lung]
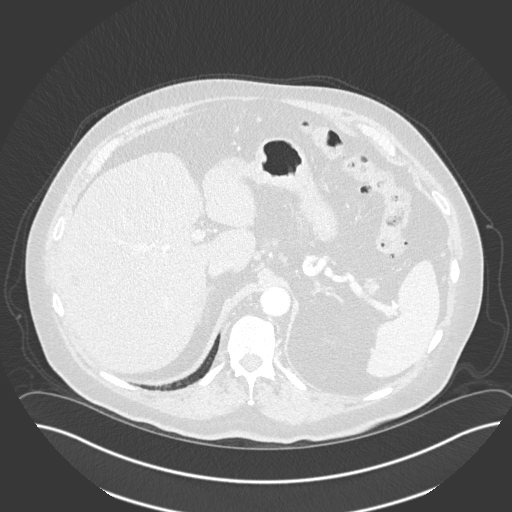
[im 41/191  lung]
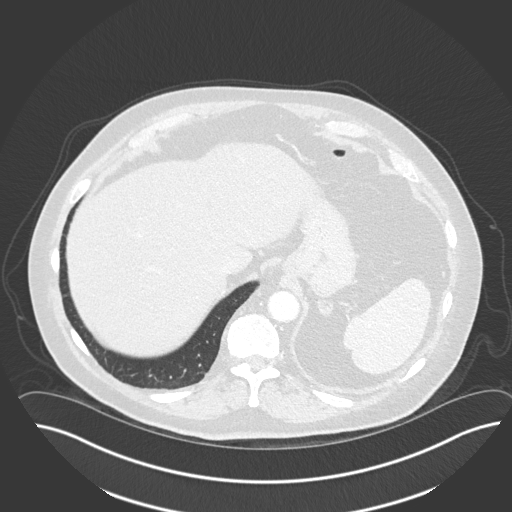
[im 55/191  lung]
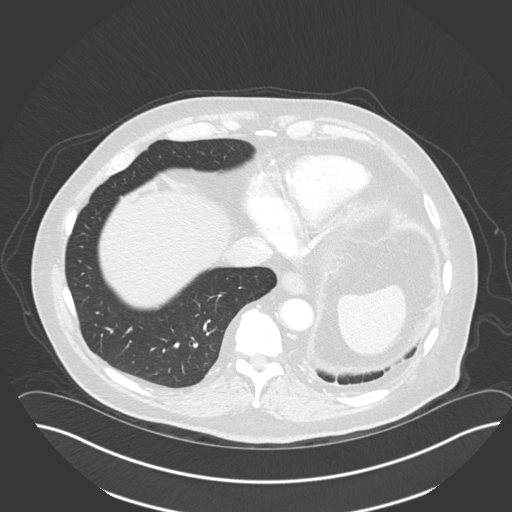
[im 68/191  mediastinal]
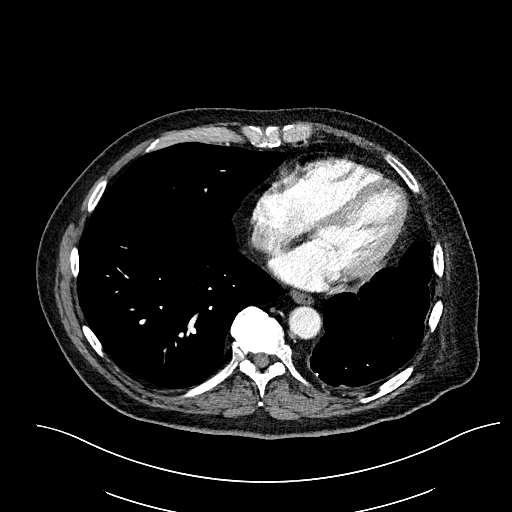
[im 68/191  lung]
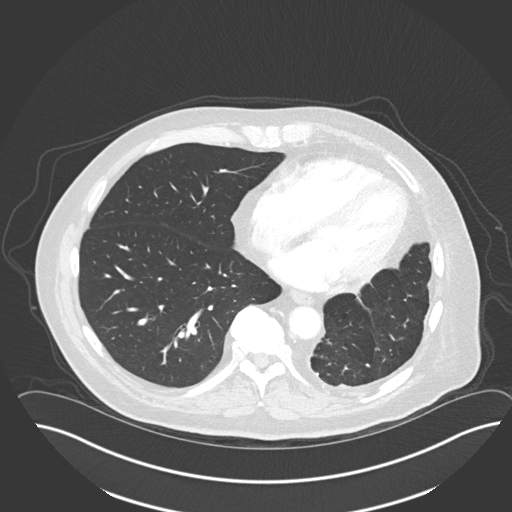
[im 82/191  lung]
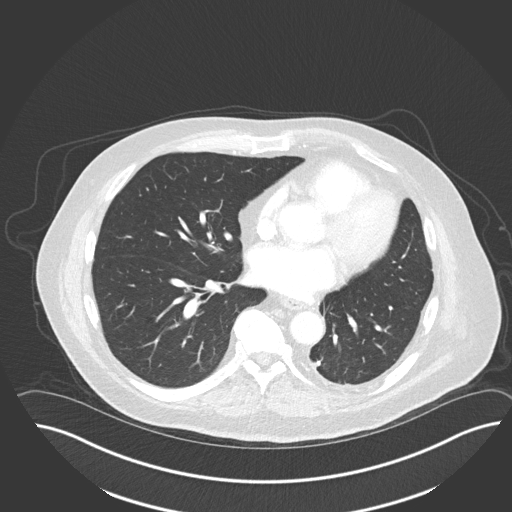
[im 109/191  lung]
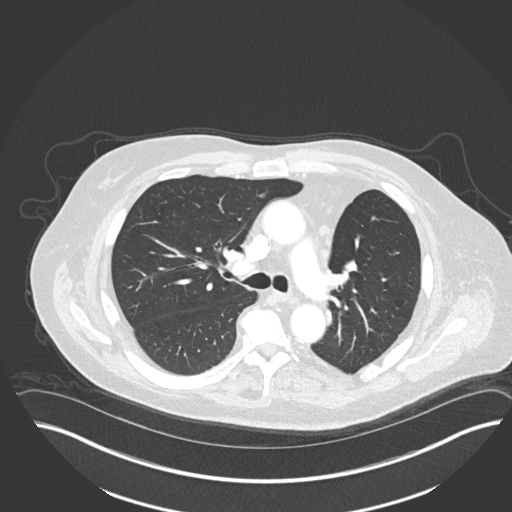
[im 123/191  lung]
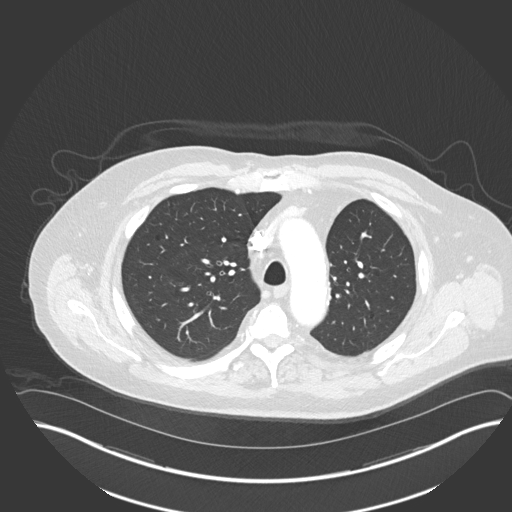
[im 136/191  mediastinal]
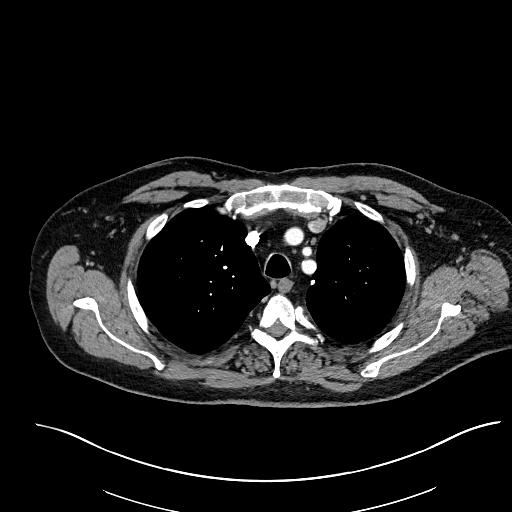
[im 136/191  lung]
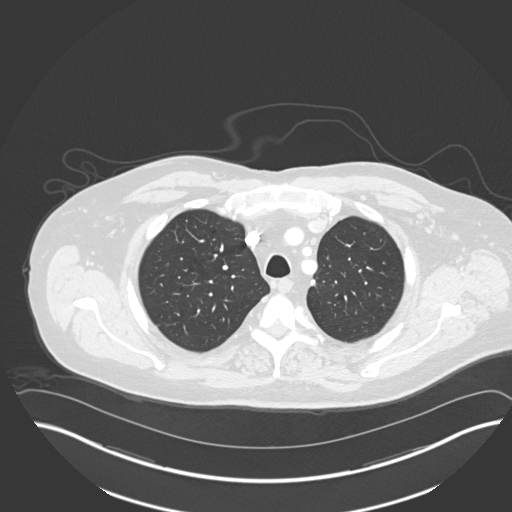
[im 150/191  lung]
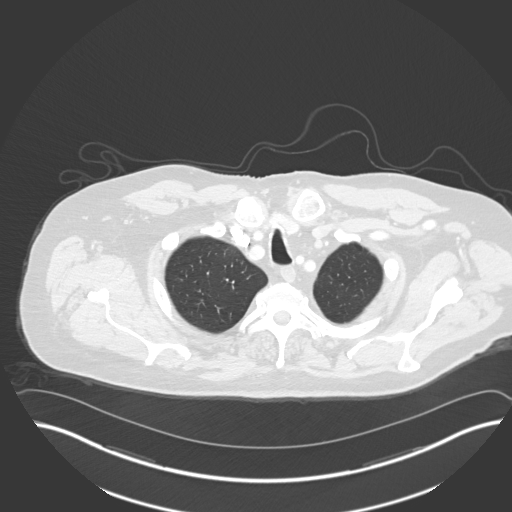
[im 163/191  lung]
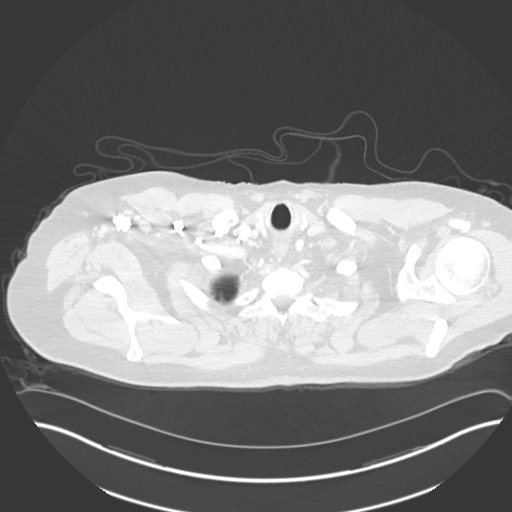
[im 177/191  lung]
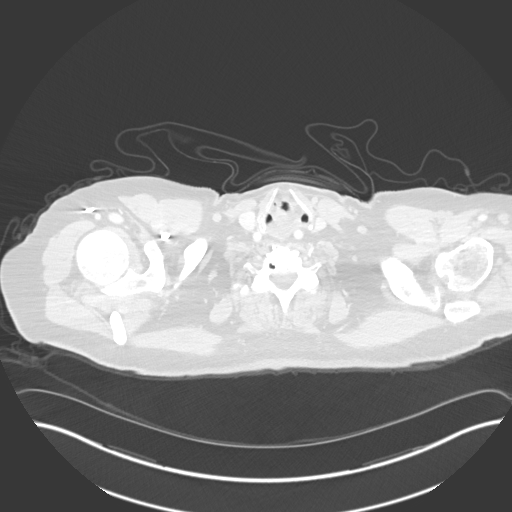

[Series 6: coronal · coronal · 0.77mm/px · 3 of 163 slices shown]
[im 33/163  lung]
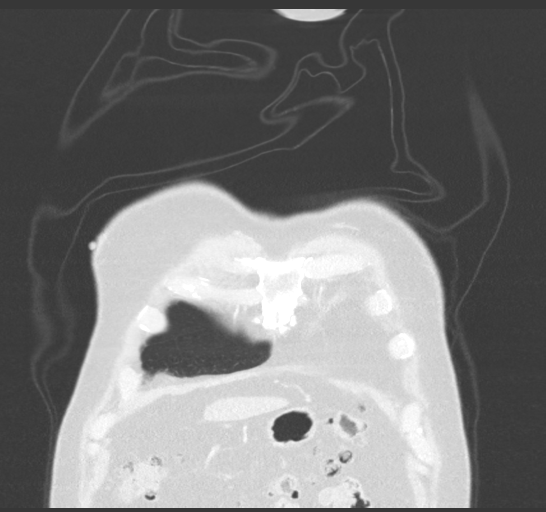
[im 65/163  lung]
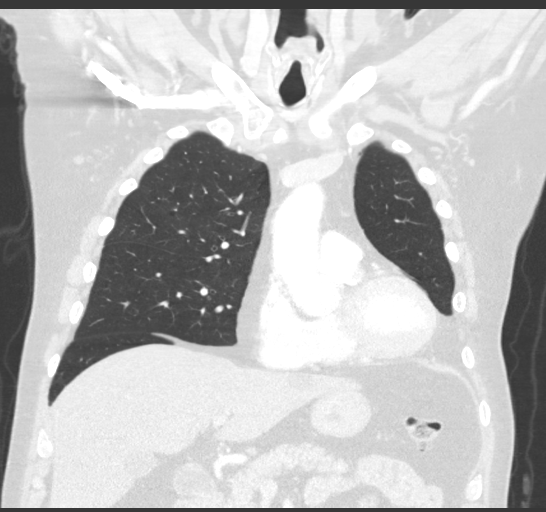
[im 98/163  lung]
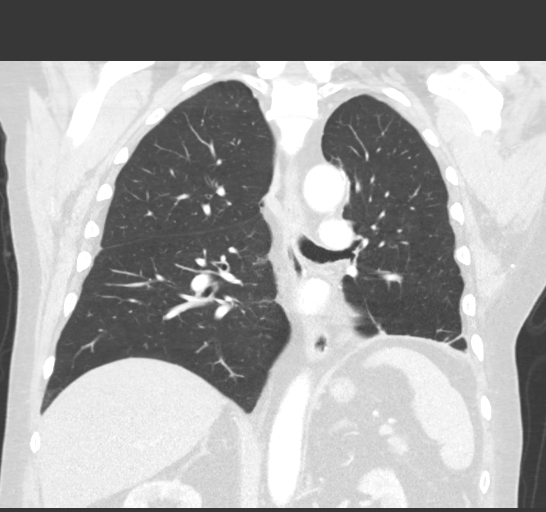

[15 of 36 positions shown; findings below may reference images not displayed]

FINDINGS: Cardiovascular: No acute findings. Aortic and coronary artery
atherosclerosis.

Mediastinum/Nodes: No masses or pathologically enlarged lymph nodes
identified.

Lungs/Pleura: Stable postop changes from left lower lobectomy. 8 mm
right upper lobe pulmonary nodule remains stable compared to
previous studies dating back to 0121. No new or enlarging pulmonary
nodules or masses identified. No evidence of pulmonary infiltrate or
pleural effusion.

Upper Abdomen: Normal adrenal glands. Stable small hepatic cysts and
tiny left posterior fundal gastric diverticulum. Diverticulosis
again seen involving the visualized portion of the colon.

Musculoskeletal:  No suspicious bone lesions.
IMPRESSION: Stable exam. No evidence of malignancy or other active disease
within the thorax.

Aortic Atherosclerosis ([id number]). Coronary artery calcification.

## 2018-05-01 ENCOUNTER — Telehealth: Payer: Self-pay

## 2018-05-01 MED ORDER — METOPROLOL TARTRATE 50 MG PO TABS: 75.0000 mg | ORAL_TABLET | Freq: Two times a day (BID) | ORAL | 1 refills | Status: DC

## 2018-05-01 NOTE — Telephone Encounter (Signed)
Copied from [place] [id number]. Topic: Quick Communication - Rx Refill/Question >> Apr 30, 2018  2:01 PM Waylan Rocher, Lumin L wrote: Medication: metoprolol tartrate (LOPRESSOR) 50 MG tablet 1 1/2 tablets 2x a day (90 day supply needed, last script was called in for 1 month supply) DOES NOT NEED A REFILL AT THIS TIME  Has the patient contacted their pharmacy? Yes.   (Agent: If no, request that the patient contact the pharmacy for the refill.) (Agent: If yes, when and what did the pharmacy advise?)  Preferred Pharmacy (with phone number or street name): [facility], [place] [address] Phone: [phone] Fax: [phone]  Agent: Please be advised that RX refills may take up to 3 business days. We ask that you follow-up with your pharmacy.

## 2018-05-02 DIAGNOSIS — H25811 Combined forms of age-related cataract, right eye: Secondary | ICD-10-CM | POA: Diagnosis not present

## 2018-05-02 DIAGNOSIS — H5213 Myopia, bilateral: Secondary | ICD-10-CM | POA: Diagnosis not present

## 2018-05-02 DIAGNOSIS — H52223 Regular astigmatism, bilateral: Secondary | ICD-10-CM | POA: Diagnosis not present

## 2018-05-14 ENCOUNTER — Encounter: Payer: Self-pay | Admitting: Family Medicine

## 2018-05-14 DIAGNOSIS — L400 Psoriasis vulgaris: Secondary | ICD-10-CM | POA: Diagnosis not present

## 2018-05-14 DIAGNOSIS — L570 Actinic keratosis: Secondary | ICD-10-CM | POA: Diagnosis not present

## 2018-05-14 DIAGNOSIS — L814 Other melanin hyperpigmentation: Secondary | ICD-10-CM | POA: Diagnosis not present

## 2018-05-14 DIAGNOSIS — L821 Other seborrheic keratosis: Secondary | ICD-10-CM | POA: Diagnosis not present

## 2018-05-14 DIAGNOSIS — Z85828 Personal history of other malignant neoplasm of skin: Secondary | ICD-10-CM | POA: Diagnosis not present

## 2018-05-14 DIAGNOSIS — L579 Skin changes due to chronic exposure to nonionizing radiation, unspecified: Secondary | ICD-10-CM | POA: Diagnosis not present

## 2018-05-19 ENCOUNTER — Ambulatory Visit (INDEPENDENT_AMBULATORY_CARE_PROVIDER_SITE_OTHER): Payer: Medicare HMO | Admitting: Family Medicine

## 2018-05-19 ENCOUNTER — Encounter: Payer: Self-pay | Admitting: Family Medicine

## 2018-05-19 VITALS — BP 126/70 | HR 68 | Temp 98.3°F | Resp 18 | Ht 70.0 in | Wt 231.6 lb

## 2018-05-19 DIAGNOSIS — R000 Tachycardia, unspecified: Secondary | ICD-10-CM

## 2018-05-19 DIAGNOSIS — E782 Mixed hyperlipidemia: Secondary | ICD-10-CM

## 2018-05-19 DIAGNOSIS — Z23 Encounter for immunization: Secondary | ICD-10-CM

## 2018-05-19 DIAGNOSIS — N401 Enlarged prostate with lower urinary tract symptoms: Secondary | ICD-10-CM

## 2018-05-19 DIAGNOSIS — R739 Hyperglycemia, unspecified: Secondary | ICD-10-CM

## 2018-05-19 DIAGNOSIS — L409 Psoriasis, unspecified: Secondary | ICD-10-CM | POA: Diagnosis not present

## 2018-05-19 DIAGNOSIS — N289 Disorder of kidney and ureter, unspecified: Secondary | ICD-10-CM

## 2018-05-19 DIAGNOSIS — R351 Nocturia: Secondary | ICD-10-CM

## 2018-05-19 DIAGNOSIS — I10 Essential (primary) hypertension: Secondary | ICD-10-CM | POA: Diagnosis not present

## 2018-05-19 DIAGNOSIS — M1A9XX0 Chronic gout, unspecified, without tophus (tophi): Secondary | ICD-10-CM

## 2018-05-19 DIAGNOSIS — H269 Unspecified cataract: Secondary | ICD-10-CM | POA: Insufficient documentation

## 2018-05-19 DIAGNOSIS — Z0000 Encounter for general adult medical examination without abnormal findings: Secondary | ICD-10-CM

## 2018-05-19 LAB — URINALYSIS
Bilirubin Urine: NEGATIVE
Hgb urine dipstick: NEGATIVE
Ketones, ur: NEGATIVE
Leukocytes, UA: NEGATIVE
Nitrite: NEGATIVE
PH: 7 (ref 5.0–8.0)
TOTAL PROTEIN, URINE-UPE24: NEGATIVE
Urine Glucose: NEGATIVE
Urobilinogen, UA: 1 (ref 0.0–1.0)

## 2018-05-19 LAB — LIPID PANEL
CHOLESTEROL: 186 mg/dL (ref 0–200)
HDL: 46.6 mg/dL (ref >39.00)
NonHDL: 139.3
TRIGLYCERIDES: 389 mg/dL — AB (ref 0.0–149.0)
Total CHOL/HDL Ratio: 4
VLDL: 77.8 mg/dL — AB (ref 0.0–40.0)

## 2018-05-19 LAB — PSA: PSA: 2.46 ng/mL (ref 0.10–4.00)

## 2018-05-19 LAB — HEMOGLOBIN A1C: HEMOGLOBIN A1C: 5.7 % (ref 4.6–6.5)

## 2018-05-19 LAB — TESTOSTERONE: Testosterone: 165.33 ng/dL — ABNORMAL LOW (ref 300.00–890.00)

## 2018-05-19 LAB — LDL CHOLESTEROL, DIRECT: LDL DIRECT: 84 mg/dL

## 2018-05-19 LAB — TSH: TSH: 1.92 u[IU]/mL (ref 0.35–4.50)

## 2018-05-19 MED ORDER — METOPROLOL TARTRATE 50 MG PO TABS: 50.0000 mg | ORAL_TABLET | Freq: Two times a day (BID) | ORAL | 1 refills | Status: DC

## 2018-05-19 MED ORDER — METOPROLOL TARTRATE 50 MG PO TABS: 75.0000 mg | ORAL_TABLET | Freq: Two times a day (BID) | ORAL | 1 refills | Status: DC

## 2018-05-19 NOTE — Assessment & Plan Note (Signed)
Check uric acid. 

## 2018-05-19 NOTE — Progress Notes (Signed)
Subjective:    Patient ID: Kyle Long, male    DOB: 07/26/1946, 72 y.o.   MRN: [id number]  No chief complaint on file.   HPI Patient is in today for annual preventative exam and follow-up on chronic medical concerns including hypertension, gout, BPH, hyperglycemia, hyperlipidemia and more.  He feels well today.  No recent febrile illness or hospitalization.  He has been referred by his optometrist, Dr. Sabra Heck for evaluation for cataract removal.  He has a consultation on December 2.  He is following closely with his dermatologist at [facility] dermatology for his psoriasis and they are having trouble finding a treatment that is acceptable to his oncologist.  They considered methotrexate but they are concerned about the immune modulating effects.  He brings in his blood pressure log and his systolic blood pressures range between 110 and weight 40 his diastolics 22-63.  He has stopped his evening half tab of metoprolol and his numbers have remained stable.  He was noting feeling lightheaded in the shower before he did this and that has not improved.  He also would feel lightheaded upon turning quickly at times and that is also improved.  No syncope or recent illness.  No recent hospitalization.  No polyuria or polydipsia.  He is staying active.  Tries to maintain a heart healthy diet.  Manages his activities of daily living well. Denies CP/palp/SOB/HA/congestion/fevers/GI or GU c/o. Taking meds as prescribed  Past Medical History:  Diagnosis Date  . Allergic state 06/26/2015  . Amnesia 2009   isolated;for 30 mins w/elevated BP  . Arthritis   . Cancer ([place])    skin / top head  . Cellulitis 2011   LUE (no PMH of MRSA)/left elbow  . Diverticulosis   . Diverticulosis of colon   . GERD (gastroesophageal reflux disease)    OCC HEARTBURN  . Gilbert's syndrome   . Hematuria 2009   Dr Burnell Blanks  . Hyperglycemia   . Hyperlipidemia   . Hypertension   . Lung cancer, lower lobe (HCC)    Stage  IIA (T1,N1)  . Neuromuscular disorder ([place])    2010 TEMPORARY MEMORY LOSS 1-1.5 HR WAS CHECKED OUT AT [facility] , NOTHING FOUND  . Peptic ulcer 1995  . Peyronie disease   . Preventative health care 12/19/2015  . Primary cancer of left lower lobe of lung ([place]) 11/27/2011  . Recurrent upper respiratory infection (URI)    09/2011  BRONCHITIS  . SCC (squamous cell carcinoma) 01/21/2009   Qualifier: Diagnosis of  By: Linna Darner MD, Gwyndolyn Saxon   ? Basal Cell      Past Surgical History:  Procedure Laterality Date  . chemo treatment      4 treatments that last 7 hours each/last treatmebt in 2013  . COLONOSCOPY W/ POLYPECTOMY  2005   due 2015  . CYSTOSCOPY  2009  . LOBECTOMY  11/02/2011   Procedure: LOBECTOMY;  Surgeon: Melrose Nakayama, MD;  Location: [facility];  Service: Thoracic;  Laterality: Left;  LEFT LOWER LOBECTOMY  . REFRACTIVE SURGERY     Bil  . SKIN BIOPSY     4 0r 5  . Squamous Cell Cancer Excision  12/13/2016  . SUPRACLAVICAL NODE BIOPSY Left 10/15/2014   Procedure: LEFT SUPRACLAVICAL LYMPH NODE BIOPSY;  Surgeon: Melrose Nakayama, MD;  Location: [facility];  Service: Thoracic;  Laterality: Left;  LEFT SUPRACLAVICAL LYMPH NODE BIOPSY  . VASECTOMY    . VEIN SURGERY     R ankle  Family History  Problem Relation Age of Onset  . Stroke Mother        in 67s  . Hypertension Mother   . Breast cancer Mother   . Neuropathy Mother   . Melanoma Father   . Heart attack Father 64       stents  . Dementia Father   . Hypertension Sister   . Thyroid disease Sister        ??  . Multiple myeloma Sister   . Cancer Sister   . Stomach cancer Maternal Grandfather   . Lung cancer Maternal Grandfather   . Cancer Paternal Grandmother        lung, nonsmoker  . Headache Daughter   . Diabetes Paternal Aunt     Social History   Socioeconomic History  . Marital status: Married    Spouse name: Not on file  . Number of children: Not on file  . Years of education: Not on file  . Highest  education level: Not on file  Occupational History  . Occupation: retired  Scientific laboratory technician  . Financial resource strain: Not on file  . Food insecurity:    Worry: Not on file    Inability: Not on file  . Transportation needs:    Medical: Not on file    Non-medical: Not on file  Tobacco Use  . Smoking status: Former Smoker    Packs/day: 1.00    Years: 15.00    Pack years: 15.00    Last attempt to quit: 07/31/1979    Years since quitting: 38.8  . Smokeless tobacco: Never Used  Substance and Sexual Activity  . Alcohol use: Yes    Comment: Daily 3 glasses of vodka  . Drug use: No  . Sexual activity: Not Currently    Comment: lives with wife, retired from [employer], ran distribution. No dietary restrictions.  Lifestyle  . Physical activity:    Days per week: Not on file    Minutes per session: Not on file  . Stress: Not on file  Relationships  . Social connections:    Talks on phone: Not on file    Gets together: Not on file    Attends religious service: Not on file    Active member of club or organization: Not on file    Attends meetings of clubs or organizations: Not on file    Relationship status: Not on file  . Intimate partner violence:    Fear of current or ex partner: Not on file    Emotionally abused: Not on file    Physically abused: Not on file    Forced sexual activity: Not on file  Other Topics Concern  . Not on file  Social History Narrative   Reg exercise    Outpatient Medications Prior to Visit  Medication Sig Dispense Refill  . chlorthalidone (HYGROTON) 25 MG tablet TAKE 1 TABLET EVERY DAY 90 tablet 0  . clobetasol cream (TEMOVATE) 0.05 %   3  . diltiazem (CARDIZEM CD) 240 MG 24 hr capsule TAKE 1 CAPSULE EVERY DAY 90 capsule 0  . losartan (COZAAR) 100 MG tablet TAKE 1/2 TABLET EVERY DAY 45 tablet 3  . metoprolol tartrate (LOPRESSOR) 50 MG tablet Take 1.5 tablets (75 mg total) by mouth 2 (two) times daily. 90 tablet 1   No facility-administered  medications prior to visit.     Allergies  Allergen Reactions  . Lisinopril Cough    Review of Systems  Constitutional: Negative for  chills, fever and malaise/fatigue.  HENT: Negative for congestion and hearing loss.   Eyes: Negative for discharge.  Respiratory: Negative for cough, sputum production and shortness of breath.   Cardiovascular: Negative for chest pain, palpitations and leg swelling.  Gastrointestinal: Negative for abdominal pain, blood in stool, constipation, diarrhea, heartburn, nausea and vomiting.  Genitourinary: Negative for dysuria, frequency, hematuria and urgency.  Musculoskeletal: Negative for back pain, falls and myalgias.  Skin: Positive for rash. Negative for itching.  Neurological: Negative for dizziness, sensory change, loss of consciousness, weakness and headaches.  Endo/Heme/Allergies: Negative for environmental allergies. Does not bruise/bleed easily.  Psychiatric/Behavioral: Negative for depression and suicidal ideas. The patient is not nervous/anxious and does not have insomnia.        Objective:    Physical Exam  Constitutional: He is oriented to person, place, and time. He appears well-developed and well-nourished. No distress.  HENT:  Head: Normocephalic and atraumatic.  Right Ear: External ear normal.  Left Ear: External ear normal.  Nose: Nose normal.  Eyes: Pupils are equal, round, and reactive to light. Conjunctivae and EOM are normal. Right eye exhibits no discharge. Left eye exhibits no discharge.  Neck: Normal range of motion. Neck supple.  Cardiovascular: Normal rate and regular rhythm.  No murmur heard. Pulmonary/Chest: Effort normal and breath sounds normal.  Abdominal: Soft. Bowel sounds are normal. There is no tenderness.  Musculoskeletal: He exhibits no edema.  Neurological: He is alert and oriented to person, place, and time. He displays normal reflexes. No cranial nerve deficit. Coordination normal.  Skin: Skin is warm and  dry.  Psychiatric: He has a normal mood and affect.  Nursing note and vitals reviewed.   BP 126/70 (BP Location: Left Arm, Patient Position: Sitting, Cuff Size: Normal)   Pulse 68   Temp 98.3 F (36.8 C) (Oral)   Resp 18   Ht _0  (1.778 m)   Wt 231 lb 9.6 oz (105.1 kg)   SpO2 98%   BMI 33.23 kg/m  Wt Readings from Last 3 Encounters:  05/19/18 231 lb 9.6 oz (105.1 kg)  02/26/18 230 lb 8 oz (104.6 kg)  11/18/17 227 lb 3.2 oz (103.1 kg)     Lab Results  Component Value Date   WBC 9.0 02/24/2018   HGB 13.8 02/24/2018   HCT 40.0 02/24/2018   PLT 193 02/24/2018   GLUCOSE 101 (H) 02/24/2018   CHOL 175 07/11/2017   TRIG 241.0 (H) 07/11/2017   HDL 53.40 07/11/2017   LDLDIRECT 96.0 07/11/2017   LDLCALC 86 12/15/2015   ALT 39 02/24/2018   AST 29 02/24/2018   NA 138 02/24/2018   K 3.8 02/24/2018   CL 99 02/24/2018   CREATININE 1.52 (H) 02/24/2018   BUN 19 02/24/2018   CO2 30 02/24/2018   TSH 1.88 07/11/2017   PSA 1.86 09/04/2012   INR 1.03 10/12/2014   HGBA1C 5.8 07/11/2017    Lab Results  Component Value Date   TSH 1.88 07/11/2017   Lab Results  Component Value Date   WBC 9.0 02/24/2018   HGB 13.8 02/24/2018   HCT 40.0 02/24/2018   MCV 96.5 02/24/2018   PLT 193 02/24/2018   Lab Results  Component Value Date   NA 138 02/24/2018   K 3.8 02/24/2018   CHLORIDE 104 02/15/2017   CO2 30 02/24/2018   GLUCOSE 101 (H) 02/24/2018   BUN 19 02/24/2018   CREATININE 1.52 (H) 02/24/2018   BILITOT 0.8 02/24/2018   ALKPHOS 49 02/24/2018  AST 29 02/24/2018   ALT 39 02/24/2018   PROT 7.3 02/24/2018   ALBUMIN 4.0 02/24/2018   CALCIUM 9.4 02/24/2018   ANIONGAP 9 02/24/2018   EGFR 49 (L) 02/15/2017   GFR 50.27 (L) 07/11/2017   Lab Results  Component Value Date   CHOL 175 07/11/2017   Lab Results  Component Value Date   HDL 53.40 07/11/2017   Lab Results  Component Value Date   LDLCALC 86 12/15/2015   Lab Results  Component Value Date   TRIG 241.0 (H)  07/11/2017   Lab Results  Component Value Date   CHOLHDL 3 07/11/2017   Lab Results  Component Value Date   HGBA1C 5.8 07/11/2017       Assessment & Plan:   Problem List Items Addressed This Visit    Hyperlipidemia, mixed    Encouraged heart healthy diet, increase exercise, avoid trans fats, consider a krill oil cap daily      Relevant Medications   metoprolol tartrate (LOPRESSOR) 50 MG tablet   Other Relevant Orders   Lipid panel   Essential hypertension    Well controlled, no changes to meds. Encouraged heart healthy diet such as the DASH diet and exercise as tolerated. He dropped his evening 1/2 tab of Metoprolol and has felt better with less light headedness in shower and quick movements. Will drop am 1/2 tab and recheck bp in 1 month      Relevant Medications   metoprolol tartrate (LOPRESSOR) 50 MG tablet   Other Relevant Orders   TSH   Benign prostatic hyperplasia    Check       Relevant Orders   PSA   Testosterone   Renal insufficiency    follows with nephrology      Hyperglycemia   Relevant Orders   Hemoglobin A1c   Gout    Check uric acid       Tachycardia    RRR today      Relevant Orders   TSH   Preventative health care    Patient encouraged to maintain heart healthy diet, regular exercise, adequate sleep. Consider daily probiotics. Take medications as prescribed      Psoriasis    Is working with dermatology, [facility] dermatology will request records      Cataract    Has a consultation with surgeon on December 2 to get started with replacement surgeries       Other Visit Diagnoses    Nocturia    -  Primary   Relevant Orders   Urinalysis      I have discontinued [place] metoprolol tartrate. I have also changed his metoprolol tartrate. Additionally, I am having him maintain his clobetasol cream, losartan, chlorthalidone, and diltiazem.  Meds ordered this encounter  Medications  . DISCONTD: metoprolol tartrate  (LOPRESSOR) 50 MG tablet    Sig: Take 1.5 tablets (75 mg total) by mouth 2 (two) times daily.    Dispense:  270 tablet    Refill:  1  . metoprolol tartrate (LOPRESSOR) 50 MG tablet    Sig: Take 1 tablet (50 mg total) by mouth 2 (two) times daily.    Dispense:  180 tablet    Refill:  1     [name], MD

## 2018-05-19 NOTE — Assessment & Plan Note (Signed)
Is working with dermatology, [facility] dermatology will request records

## 2018-05-19 NOTE — Assessment & Plan Note (Signed)
Check

## 2018-05-19 NOTE — Assessment & Plan Note (Signed)
follows with nephrology

## 2018-05-19 NOTE — Assessment & Plan Note (Signed)
Patient encouraged to maintain heart healthy diet, regular exercise, adequate sleep. Consider daily probiotics. Take medications as prescribed 

## 2018-05-19 NOTE — Assessment & Plan Note (Signed)
Encouraged heart healthy diet, increase exercise, avoid trans fats, consider a krill oil cap daily 

## 2018-05-19 NOTE — Patient Instructions (Addendum)
[employer] Medicare product   Blood pressure range 100-140/60-90 Pulse range 60-90 Call and/or go back up on Metoprolol if running higher than that consistently  Shingrix is the new shingles shot 2 shots over 2-6 months at [place] 65 Years and Older, Male Preventive care refers to lifestyle choices and visits with your health care provider that can promote health and wellness. What does preventive care include?  A yearly physical exam. This is also called an annual well check.  Dental exams once or twice a year.  Routine eye exams. Ask your health care provider how often you should have your eyes checked.  Personal lifestyle choices, including: ? Daily care of your teeth and gums. ? Regular physical activity. ? Eating a healthy diet. ? Avoiding tobacco and drug use. ? Limiting alcohol use. ? Practicing safe sex. ? Taking low doses of aspirin every day. ? Taking vitamin and mineral supplements as recommended by your health care provider. What happens during an annual well check? The services and screenings done by your health care provider during your annual well check will depend on your age, overall health, lifestyle risk factors, and family history of disease. Counseling Your health care provider may ask you questions about your:  Alcohol use.  Tobacco use.  Drug use.  Emotional well-being.  Home and relationship well-being.  Sexual activity.  Eating habits.  History of falls.  Memory and ability to understand (cognition).  Work and work Statistician.  Screening You may have the following tests or measurements:  Height, weight, and BMI.  Blood pressure.  Lipid and cholesterol levels. These may be checked every 5 years, or more frequently if you are over 40 years old.  Skin check.  Lung cancer screening. You may have this screening every year starting at age 52 if you have a 30-pack-year history of smoking and currently  smoke or have quit within the past 15 years.  Fecal occult blood test (FOBT) of the stool. You may have this test every year starting at age 57.  Flexible sigmoidoscopy or colonoscopy. You may have a sigmoidoscopy every 5 years or a colonoscopy every 10 years starting at age 33.  Prostate cancer screening. Recommendations will vary depending on your family history and other risks.  Hepatitis C blood test.  Hepatitis B blood test.  Sexually transmitted disease (STD) testing.  Diabetes screening. This is done by checking your blood sugar (glucose) after you have not eaten for a while (fasting). You may have this done every 1-3 years.  Abdominal aortic aneurysm (AAA) screening. You may need this if you are a current or former smoker.  Osteoporosis. You may be screened starting at age 64 if you are at high risk.  Talk with your health care provider about your test results, treatment options, and if necessary, the need for more tests. Vaccines Your health care provider may recommend certain vaccines, such as:  Influenza vaccine. This is recommended every year.  Tetanus, diphtheria, and acellular pertussis (Tdap, Td) vaccine. You may need a Td booster every 10 years.  Varicella vaccine. You may need this if you have not been vaccinated.  Zoster vaccine. You may need this after age 69.  Measles, mumps, and rubella (MMR) vaccine. You may need at least one dose of MMR if you were born in 1957 or later. You may also need a second dose.  Pneumococcal 13-valent conjugate (PCV13) vaccine. One dose is recommended after age 49.  Pneumococcal polysaccharide (PPSV23)  vaccine. One dose is recommended after age 3.  Meningococcal vaccine. You may need this if you have certain conditions.  Hepatitis A vaccine. You may need this if you have certain conditions or if you travel or work in places where you may be exposed to hepatitis A.  Hepatitis B vaccine. You may need this if you have certain  conditions or if you travel or work in places where you may be exposed to hepatitis B.  Haemophilus influenzae type b (Hib) vaccine. You may need this if you have certain risk factors.  Talk to your health care provider about which screenings and vaccines you need and how often you need them. This information is not intended to replace advice given to you by your health care provider. Make sure you discuss any questions you have with your health care provider. Document Released: 08/12/2015 Document Revised: 04/04/2016 Document Reviewed: 05/17/2015 Elsevier Interactive Patient Education  [employer].

## 2018-05-19 NOTE — Assessment & Plan Note (Signed)
Has a consultation with surgeon on December 2 to get started with replacement surgeries

## 2018-05-19 NOTE — Assessment & Plan Note (Signed)
Well controlled, no changes to meds. Encouraged heart healthy diet such as the DASH diet and exercise as tolerated. He dropped his evening 1/2 tab of Metoprolol and has felt better with less light headedness in shower and quick movements. Will drop am 1/2 tab and recheck bp in 1 month

## 2018-05-19 NOTE — Assessment & Plan Note (Signed)
RRR today 

## 2018-05-20 LAB — URIC ACID: URIC ACID, SERUM: 9.6 mg/dL — AB (ref 4.0–7.8)

## 2018-05-21 ENCOUNTER — Telehealth: Payer: Self-pay | Admitting: *Deleted

## 2018-05-21 NOTE — Telephone Encounter (Signed)
Received Lab Report results from [facility] and [place]; forwarded to provider/SLS 10/23

## 2018-05-27 ENCOUNTER — Telehealth: Payer: Self-pay | Admitting: Medical Oncology

## 2018-05-27 NOTE — Telephone Encounter (Signed)
I asked pt to return my call.

## 2018-05-28 ENCOUNTER — Telehealth: Payer: Self-pay | Admitting: Family Medicine

## 2018-05-28 NOTE — Telephone Encounter (Signed)
Copied from [place] [id number]. Topic: Quick Communication - See Telephone Encounter >> May 28, 2018 10:15 AM Rosalin Hawking wrote: CRM for notification. See Telephone encounter for: 05/28/18.   Pt came in office stating that from his Dermatologist had some lab work done for pt and that on May 20, 2018 labs were faxed to our office, pt is awaiting to see his results on Mychart and has not seen it yet, pt would like to know if office received those results, if not inform pt so that he can call them so that results can be faxed to our office. Pt also dropped of document regarding about his rx order for metoprolol that the pharmacy [facility] is trying to get in contact with provider to process his order for his rx, pt is needing his rx. (document put at front office under prescription orders with providers name)

## 2018-05-29 ENCOUNTER — Telehealth: Payer: Self-pay | Admitting: Family Medicine

## 2018-05-29 MED ORDER — METOPROLOL TARTRATE 50 MG PO TABS: 50.0000 mg | ORAL_TABLET | Freq: Two times a day (BID) | ORAL | 1 refills | Status: DC

## 2018-05-29 NOTE — Telephone Encounter (Signed)
Copied from [place] [id number]. Topic: Quick Communication - See Telephone Encounter >> May 29, 2018  8:40 AM Ahmed Prima L wrote: CRM for notification. See Telephone encounter for: 05/29/18.  [facility] called and stated that they received two different scripts for metoprolol tartrate (LOPRESSOR) 50 MG tablet and each one had different directions on how to take the medication. One script said " one tablet twice a day " and the other said " 1/2 tablets twice a day " please contact [facility] @ [phone] reference number [id number]

## 2018-05-30 NOTE — Telephone Encounter (Signed)
Updated medication and sent to pharmacy

## 2018-06-03 ENCOUNTER — Other Ambulatory Visit: Payer: Self-pay | Admitting: Family Medicine

## 2018-06-03 NOTE — Telephone Encounter (Signed)
Left message a detailed message letting him know that we have not seen his lab results. And we are not able to upload his results on My chart if we do not result them.

## 2018-06-11 DIAGNOSIS — N183 Chronic kidney disease, stage 3 (moderate): Secondary | ICD-10-CM | POA: Diagnosis not present

## 2018-06-16 MED FILL — CLOBETASOL PROPIONATE 0.05: 0.05 | 30 days supply | Qty: 60 | Fill #2

## 2018-06-17 ENCOUNTER — Ambulatory Visit (INDEPENDENT_AMBULATORY_CARE_PROVIDER_SITE_OTHER): Payer: Medicare HMO | Admitting: Family Medicine

## 2018-06-17 VITALS — BP 138/86 | HR 71

## 2018-06-17 DIAGNOSIS — I10 Essential (primary) hypertension: Secondary | ICD-10-CM

## 2018-06-17 NOTE — Progress Notes (Signed)
Nursing blood pressure check note reviewed. Agree with documention and plan.

## 2018-06-17 NOTE — Progress Notes (Signed)
Pre visit review using our clinic tool,if applicable. No additional management support is needed unless otherwise documented below in the visit note.   Pt here for Blood pressure check per order from Dr. Frederik Pear. Pt currently takes: Metoprolol 50 mg BID   Pt reports compliance with medication. States lightheadedness has subsided.   BP today  =138/86 HR = 71  Pt advised per Dr. Charlett Blake to continue medication as ordered. See provider within next 6 months. Patient states he has appointment scheduled.

## 2018-06-18 ENCOUNTER — Other Ambulatory Visit: Payer: Self-pay | Admitting: Family Medicine

## 2018-06-30 DIAGNOSIS — H35033 Hypertensive retinopathy, bilateral: Secondary | ICD-10-CM | POA: Diagnosis not present

## 2018-06-30 DIAGNOSIS — H2513 Age-related nuclear cataract, bilateral: Secondary | ICD-10-CM | POA: Diagnosis not present

## 2018-06-30 DIAGNOSIS — H3581 Retinal edema: Secondary | ICD-10-CM | POA: Diagnosis not present

## 2018-06-30 DIAGNOSIS — H35371 Puckering of macula, right eye: Secondary | ICD-10-CM | POA: Diagnosis not present

## 2018-07-10 MED FILL — PREDNISOLONE AC 1% EYE DROP: 1 | 25 days supply | Qty: 5 | Fill #0

## 2018-07-10 MED FILL — OFLOXACIN 0.3% EYE DROPS: 0.3 | 25 days supply | Qty: 5 | Fill #0

## 2018-07-16 DIAGNOSIS — H35371 Puckering of macula, right eye: Secondary | ICD-10-CM | POA: Diagnosis not present

## 2018-07-29 DIAGNOSIS — Z09 Encounter for follow-up examination after completed treatment for conditions other than malignant neoplasm: Secondary | ICD-10-CM | POA: Diagnosis not present

## 2018-07-29 DIAGNOSIS — H35371 Puckering of macula, right eye: Secondary | ICD-10-CM | POA: Diagnosis not present

## 2018-08-12 ENCOUNTER — Encounter: Payer: Self-pay | Admitting: Family Medicine

## 2018-08-12 ENCOUNTER — Telehealth: Payer: Self-pay | Admitting: Family Medicine

## 2018-08-12 ENCOUNTER — Ambulatory Visit (INDEPENDENT_AMBULATORY_CARE_PROVIDER_SITE_OTHER): Payer: Medicare HMO | Admitting: Family Medicine

## 2018-08-12 ENCOUNTER — Ambulatory Visit (HOSPITAL_BASED_OUTPATIENT_CLINIC_OR_DEPARTMENT_OTHER)
Admission: RE | Admit: 2018-08-12 | Discharge: 2018-08-12 | Disposition: A | Discharge status: Home / Self Care | Payer: Medicare HMO | Source: Ambulatory Visit | Attending: Family Medicine | Admitting: Family Medicine

## 2018-08-12 VITALS — BP 126/78 | HR 97 | Temp 99.9°F | Resp 18 | Ht 70.0 in | Wt 224.0 lb

## 2018-08-12 DIAGNOSIS — J40 Bronchitis, not specified as acute or chronic: Secondary | ICD-10-CM

## 2018-08-12 DIAGNOSIS — R05 Cough: Secondary | ICD-10-CM | POA: Diagnosis not present

## 2018-08-12 MED ORDER — AZITHROMYCIN 250 MG PO TABS: ORAL_TABLET | ORAL | 0 refills | Status: DC

## 2018-08-12 MED ORDER — PROMETHAZINE-DM 6.25-15 MG/5ML PO SYRP: 5.0000 mL | ORAL_SOLUTION | Freq: Four times a day (QID) | ORAL | 0 refills | Status: DC | PRN

## 2018-08-12 NOTE — Telephone Encounter (Signed)
Copied from [place] [id number]. Topic: Quick Communication - Rx Refill/Question >> Aug 12, 2018 12:01 PM Scherrie Gerlach wrote: Medication: promethazine-dextromethorphan (PROMETHAZINE-DM) 6.25-15 MG/5ML syrup Pt just saw Dr Etter Sjogren, and this med is on backorder, has been several months.  Pt is going to heading home, and need a new Rx for cough med sent to pharmacy near their home. [facility], backordered. Wife states pt desperately need a cough med. Please call new Rx to [id number] - [place], [place] - [address] AT [place] [phone] (Phone) [phone] (Fax)   Please do not send to [name], as they called earlier.

## 2018-08-12 NOTE — Progress Notes (Signed)
Patient ID: Kyle Long, male    DOB: 04/07/1946  Age: 73 y.o. MRN: [id number]    Subjective:  Subjective  HPI Kyle Long presents for cough and congestion with wheezing x 1 week.  Pt with hx lung ca.  No fever No otc meds  Review of Systems  Constitutional: Positive for chills. Negative for fever.  HENT: Positive for congestion, postnasal drip, rhinorrhea and sinus pressure.   Respiratory: Positive for cough, chest tightness, shortness of breath and wheezing.   Cardiovascular: Negative for chest pain, palpitations and leg swelling.  Allergic/Immunologic: Negative for environmental allergies.    History Past Medical History:  Diagnosis Date  . Allergic state 06/26/2015  . Amnesia 2009   isolated;for 30 mins w/elevated BP  . Arthritis   . Cancer ([place])    skin / top head  . Cellulitis 2011   LUE (no PMH of MRSA)/left elbow  . Diverticulosis   . Diverticulosis of colon   . GERD (gastroesophageal reflux disease)    OCC HEARTBURN  . Gilbert's syndrome   . Hematuria 2009   Dr Burnell Blanks  . Hyperglycemia   . Hyperlipidemia   . Hypertension   . Lung cancer, lower lobe (HCC)    Stage IIA (T1,N1)  . Neuromuscular disorder ([place])    2010 TEMPORARY MEMORY LOSS 1-1.5 HR WAS CHECKED OUT AT [facility] , NOTHING FOUND  . Peptic ulcer 1995  . Peyronie disease   . Preventative health care 12/19/2015  . Primary cancer of left lower lobe of lung ([place]) 11/27/2011  . Recurrent upper respiratory infection (URI)    09/2011  BRONCHITIS  . SCC (squamous cell carcinoma) 01/21/2009   Qualifier: Diagnosis of  By: Linna Darner MD, Gwyndolyn Saxon   ? Basal Cell      Kyle Long has a past surgical history that includes Colonoscopy w/ polypectomy (2005); Vasectomy; Cystoscopy (2009); Vein Surgery; Lobectomy (11/02/2011); Refractive surgery; chemo treatment; Skin biopsy; Supraclavical node biopsy (Left, 10/15/2014); and Squamous Cell Cancer Excision (12/13/2016).   His family history includes Breast cancer in his  mother; Cancer in his paternal grandmother and sister; Dementia in his father; Diabetes in his paternal aunt; Headache in his daughter; Heart attack (age of onset: 38) in his father; Hypertension in his mother and sister; Lung cancer in his maternal grandfather; Melanoma in his father; Multiple myeloma in his sister; Neuropathy in his mother; Stomach cancer in his maternal grandfather; Stroke in his mother; Thyroid disease in his sister.Kyle Long reports that Kyle Long quit smoking about 39 years ago. Kyle Long has a 15.00 pack-year smoking history. Kyle Long has never used smokeless tobacco. Kyle Long reports current alcohol use. Kyle Long reports that Kyle Long does not use drugs.  Current Outpatient Medications on File Prior to Visit  Medication Sig Dispense Refill  . chlorthalidone (HYGROTON) 25 MG tablet TAKE 1 TABLET EVERY DAY 90 tablet 0  . clobetasol cream (TEMOVATE) 0.05 %   3  . diltiazem (CARDIZEM CD) 240 MG 24 hr capsule Take 1 capsule (240 mg total) by mouth daily. 90 capsule 1  . losartan (COZAAR) 100 MG tablet TAKE 1/2 TABLET EVERY DAY 45 tablet 3  . metoprolol tartrate (LOPRESSOR) 50 MG tablet Take 1 tablet (50 mg total) by mouth 2 (two) times daily. 180 tablet 1   No current facility-administered medications on file prior to visit.      Objective:  Objective  Physical Exam Vitals signs and nursing note reviewed.  Constitutional:      Appearance: Kyle Long is well-developed.  HENT:  Right Ear: External ear normal.     Left Ear: External ear normal.  Eyes:     General:        Right eye: No discharge.        Left eye: No discharge.     Conjunctiva/sclera: Conjunctivae normal.  Cardiovascular:     Rate and Rhythm: Normal rate and regular rhythm.     Heart sounds: Normal heart sounds. No murmur.  Pulmonary:     Effort: Pulmonary effort is normal. No respiratory distress.     Breath sounds: Wheezing and rhonchi present. No rales.  Chest:     Chest wall: No tenderness.  Lymphadenopathy:     Cervical: Cervical adenopathy  present.  Neurological:     Mental Status: Kyle Long is alert and oriented to person, place, and time.    BP 126/78 (BP Location: Left Arm, Patient Position: Sitting, Cuff Size: Normal)   Pulse 97   Temp 99.9 F (37.7 C) (Oral)   Resp 18   Ht 5' 10"  (1.778 m)   Wt 224 lb (101.6 kg)   SpO2 99%   BMI 32.14 kg/m  Wt Readings from Last 3 Encounters:  08/12/18 224 lb (101.6 kg)  05/19/18 231 lb 9.6 oz (105.1 kg)  02/26/18 230 lb 8 oz (104.6 kg)     Lab Results  Component Value Date   WBC 9.0 02/24/2018   HGB 13.8 02/24/2018   HCT 40.0 02/24/2018   PLT 193 02/24/2018   GLUCOSE 101 (H) 02/24/2018   CHOL 186 05/19/2018   TRIG 389.0 (H) 05/19/2018   HDL 46.60 05/19/2018   LDLDIRECT 84.0 05/19/2018   LDLCALC 86 12/15/2015   ALT 39 02/24/2018   AST 29 02/24/2018   NA 138 02/24/2018   K 3.8 02/24/2018   CL 99 02/24/2018   CREATININE 1.52 (H) 02/24/2018   BUN 19 02/24/2018   CO2 30 02/24/2018   TSH 1.92 05/19/2018   PSA 2.46 05/19/2018   INR 1.03 10/12/2014   HGBA1C 5.7 05/19/2018    Ct Chest W Contrast  Result Date: 02/24/2018 CLINICAL DATA:  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 Additional history of stage IIA non-small cell lung cancer diagnosed 2013. EXAM: CT CHEST, ABDOMEN, AND PELVIS WITH CONTRAST TECHNIQUE: Multidetector CT imaging of the chest, abdomen and pelvis was performed following the standard protocol during bolus administration of intravenous contrast. CONTRAST:  65m ISOVUE-300 IOPAMIDOL (ISOVUE-300) INJECTION 61% COMPARISON:  CT 11/12/2016 FINDINGS: CT CHEST FINDINGS Cardiovascular: No significant vascular findings. Normal heart size. No pericardial effusion. Mediastinum/Nodes: No axillary supraclavicular adenopathy. No mediastinal hilar adenopathy. No pericardial effusion. Esophagus normal. Lungs/Pleura: volume loss in the LEFT hemithorax following LEFT lower lobectomy. No nodularity in the LEFT lung. The RIGHT lung is  hyperexpanded. Ground-glass nodule in the RIGHT upper lobe measures 9 mm by 9 mm (image 63/6) compared to 8 mm x 6 mm on chest CT 11/12/2016. Similar size on 11/07/2015. Benign-appearing intrafissural nodule RIGHT middle lobe unchanged. Musculoskeletal: No aggressive osseous lesion. CT ABDOMEN AND PELVIS FINDINGS Hepatobiliary: Low-density lesions in the LEFT hepatic lobe unchanged consistent benign hepatic cysts. A small cystic lesions in the RIGHT hepatic lobe are also unchanged no new hepatic lesions. No biliary duct dilatation. Pancreas: Pancreas is normal. No ductal dilatation. No pancreatic inflammation. Spleen: Normal spleen Adrenals/urinary tract: Adrenal glands normal. Kidneys, ureters and bladder normal. Stomach/Bowel: Small diverticulum from fundus of the stomach. Duodenum small-bowel normal. Appendix cecum normal. Diverticula scattered throughout the colon without acute inflammation.  Vascular/Lymphatic: Abdominal aorta is normal caliber with atherosclerotic calcification. There is no retroperitoneal or periportal lymphadenopathy. No pelvic lymphadenopathy. 8 mm RIGHT common iliac node is unchanged. Reproductive: Prostate enlarged. Other: No free fluid. Musculoskeletal: No aggressive osseous lesion. IMPRESSION: Chest Impression: 1. No evidence of lymphoma recurrence in thorax. 2. Stable postoperative change in the LEFT hemithorax. 3. Ground-glass nodule in the RIGHT upper lobe is relatively stable over comparison exams however does have suspicious imaging characteristics concerning for adenomatous carcinoma in situ. Recommend continued CT surveillance. Abdomen / Pelvis Impression: 1. No lymphadenopathy in the abdomen pelvis. 2. Extensive colon diverticulosis. Electronically Signed   By: Suzy Bouchard M.D.   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 Additional  history of stage IIA non-small cell lung cancer diagnosed 2013. EXAM: CT CHEST, ABDOMEN, AND PELVIS WITH CONTRAST TECHNIQUE: Multidetector CT imaging of the chest, abdomen and pelvis was performed following the standard protocol during bolus administration of intravenous contrast. CONTRAST:  15m ISOVUE-300 IOPAMIDOL (ISOVUE-300) INJECTION 61% COMPARISON:  CT 11/12/2016 FINDINGS: CT CHEST FINDINGS Cardiovascular: No significant vascular findings. Normal heart size. No pericardial effusion. Mediastinum/Nodes: No axillary supraclavicular adenopathy. No mediastinal hilar adenopathy. No pericardial effusion. Esophagus normal. Lungs/Pleura: volume loss in the LEFT hemithorax following LEFT lower lobectomy. No nodularity in the LEFT lung. The RIGHT lung is hyperexpanded. Ground-glass nodule in the RIGHT upper lobe measures 9 mm by 9 mm (image 63/6) compared to 8 mm x 6 mm on chest CT 11/12/2016. Similar size on 11/07/2015. Benign-appearing intrafissural nodule RIGHT middle lobe unchanged. Musculoskeletal: No aggressive osseous lesion. CT ABDOMEN AND PELVIS FINDINGS Hepatobiliary: Low-density lesions in the LEFT hepatic lobe unchanged consistent benign hepatic cysts. A small cystic lesions in the RIGHT hepatic lobe are also unchanged no new hepatic lesions. No biliary duct dilatation. Pancreas: Pancreas is normal. No ductal dilatation. No pancreatic inflammation. Spleen: Normal spleen Adrenals/urinary tract: Adrenal glands normal. Kidneys, ureters and bladder normal. Stomach/Bowel: Small diverticulum from fundus of the stomach. Duodenum small-bowel normal. Appendix cecum normal. Diverticula scattered throughout the colon without acute inflammation. Vascular/Lymphatic: Abdominal aorta is normal caliber with atherosclerotic calcification. There is no retroperitoneal or periportal lymphadenopathy. No pelvic lymphadenopathy. 8 mm RIGHT common iliac node is unchanged. Reproductive: Prostate enlarged. Other: No free fluid.  Musculoskeletal: No aggressive osseous lesion. IMPRESSION: Chest Impression: 1. No evidence of lymphoma recurrence in thorax. 2. Stable postoperative change in the LEFT hemithorax. 3. Ground-glass nodule in the RIGHT upper lobe is relatively stable over comparison exams however does have suspicious imaging characteristics concerning for adenomatous carcinoma in situ. Recommend continued CT surveillance. Abdomen / Pelvis Impression: 1. No lymphadenopathy in the abdomen pelvis. 2. Extensive colon diverticulosis. Electronically Signed   By: SSuzy BouchardM.D.   On: 02/24/2018 16:11     Assessment & Plan:  Plan  I am having GWaldon Reiningstart on azithromycin and promethazine-dextromethorphan. I am also having him maintain his clobetasol cream, losartan, metoprolol tartrate, diltiazem, and chlorthalidone.  Meds ordered this encounter  Medications  . azithromycin (ZITHROMAX Z-PAK) 250 MG tablet    Sig: As directed    Dispense:  6 each    Refill:  0  . promethazine-dextromethorphan (PROMETHAZINE-DM) 6.25-15 MG/5ML syrup    Sig: Take 5 mLs by mouth 4 (four) times daily as needed.    Dispense:  118 mL    Refill:  0    Problem List Items  Addressed This Visit    None    Visit Diagnoses    Bronchitis    -  Primary   Relevant Medications   azithromycin (ZITHROMAX Z-PAK) 250 MG tablet   promethazine-dextromethorphan (PROMETHAZINE-DM) 6.25-15 MG/5ML syrup   Other Relevant Orders   DG Chest 2 View (Completed)      Follow-up: Return in about 2 weeks (around 08/26/2018), or if symptoms worsen or fail to improve, for f/u pcp .  Ann Held, DO

## 2018-08-12 NOTE — Telephone Encounter (Signed)
Copied from [place] [id number]. Topic: Quick Communication - See Telephone Encounter >> Aug 12, 2018 11:31 AM Rutherford Nail, NT wrote: CRM for notification. See Telephone encounter for: 08/12/18. Don with [name] calling and states that the promethazine-dextromethorphan (PROMETHAZINE-DM) 6.25-15 MG/5ML syrup has been on national back order for 4 months and they are not sure when they will get that back in stock. Would like to know an alternative that Dr Etter Sjogren would like to use. Please advise.

## 2018-08-12 NOTE — Patient Instructions (Signed)
Acute Bronchitis, Adult Acute bronchitis is when air tubes (bronchi) in the lungs suddenly get swollen. The condition can make it hard to breathe. It can also cause these symptoms:  A cough.  Coughing up clear, yellow, or green mucus.  Wheezing.  Chest congestion.  Shortness of breath.  A fever.  Body aches.  Chills.  A sore throat. Follow these instructions at home:  Medicines  Take over-the-counter and prescription medicines only as told by your doctor.  If you were prescribed an antibiotic medicine, take it as told by your doctor. Do not stop taking the antibiotic even if you start to feel better. General instructions  Rest.  Drink enough fluids to keep your pee (urine) pale yellow.  Avoid smoking and secondhand smoke. If you smoke and you need help quitting, ask your doctor. Quitting will help your lungs heal faster.  Use an inhaler, cool mist vaporizer, or humidifier as told by your doctor.  Keep all follow-up visits as told by your doctor. This is important. How is this prevented? To lower your risk of getting this condition again:  Wash your hands often with soap and water. If you cannot use soap and water, use hand sanitizer.  Avoid contact with people who have cold symptoms.  Try not to touch your hands to your mouth, nose, or eyes.  Make sure to get the flu shot every year. Contact a doctor if:  Your symptoms do not get better in 2 weeks. Get help right away if:  You cough up blood.  You have chest pain.  You have very bad shortness of breath.  You become dehydrated.  You faint (pass out) or keep feeling like you are going to pass out.  You keep throwing up (vomiting).  You have a very bad headache.  Your fever or chills gets worse. This information is not intended to replace advice given to you by your health care provider. Make sure you discuss any questions you have with your health care provider. Document Released: 01/02/2008 Document  Revised: 02/27/2017 Document Reviewed: 01/04/2016 Elsevier Interactive Patient Education  2019 Elsevier Inc.  

## 2018-08-15 MED ORDER — PROMETHAZINE-DM 6.25-15 MG/5ML PO SYRP: 5.0000 mL | ORAL_SOLUTION | Freq: Four times a day (QID) | ORAL | 0 refills | Status: DC | PRN

## 2018-08-15 NOTE — Telephone Encounter (Signed)
Medication sent in patient made aware

## 2018-08-27 DIAGNOSIS — H35371 Puckering of macula, right eye: Secondary | ICD-10-CM | POA: Diagnosis not present

## 2018-08-27 DIAGNOSIS — H2511 Age-related nuclear cataract, right eye: Secondary | ICD-10-CM | POA: Diagnosis not present

## 2018-08-27 DIAGNOSIS — H2513 Age-related nuclear cataract, bilateral: Secondary | ICD-10-CM | POA: Diagnosis not present

## 2018-08-27 DIAGNOSIS — H25043 Posterior subcapsular polar age-related cataract, bilateral: Secondary | ICD-10-CM | POA: Diagnosis not present

## 2018-08-27 DIAGNOSIS — H25013 Cortical age-related cataract, bilateral: Secondary | ICD-10-CM | POA: Diagnosis not present

## 2018-09-25 DIAGNOSIS — H35371 Puckering of macula, right eye: Secondary | ICD-10-CM | POA: Diagnosis not present

## 2018-09-25 DIAGNOSIS — Z09 Encounter for follow-up examination after completed treatment for conditions other than malignant neoplasm: Secondary | ICD-10-CM | POA: Diagnosis not present

## 2018-09-30 DIAGNOSIS — H25811 Combined forms of age-related cataract, right eye: Secondary | ICD-10-CM | POA: Diagnosis not present

## 2018-09-30 DIAGNOSIS — H2511 Age-related nuclear cataract, right eye: Secondary | ICD-10-CM | POA: Diagnosis not present

## 2018-09-30 DIAGNOSIS — Z961 Presence of intraocular lens: Secondary | ICD-10-CM | POA: Diagnosis not present

## 2018-10-01 DIAGNOSIS — H25042 Posterior subcapsular polar age-related cataract, left eye: Secondary | ICD-10-CM | POA: Diagnosis not present

## 2018-10-01 DIAGNOSIS — H2512 Age-related nuclear cataract, left eye: Secondary | ICD-10-CM | POA: Diagnosis not present

## 2018-10-01 DIAGNOSIS — H25012 Cortical age-related cataract, left eye: Secondary | ICD-10-CM | POA: Diagnosis not present

## 2018-10-07 DIAGNOSIS — H25812 Combined forms of age-related cataract, left eye: Secondary | ICD-10-CM | POA: Diagnosis not present

## 2018-10-07 DIAGNOSIS — H2512 Age-related nuclear cataract, left eye: Secondary | ICD-10-CM | POA: Diagnosis not present

## 2018-10-12 ENCOUNTER — Encounter: Payer: Self-pay | Admitting: Internal Medicine

## 2018-10-16 ENCOUNTER — Other Ambulatory Visit: Payer: Self-pay | Admitting: Family Medicine

## 2018-10-16 MED ORDER — METOPROLOL TARTRATE 50 MG PO TABS: 50.0000 mg | ORAL_TABLET | Freq: Two times a day (BID) | ORAL | 1 refills | Status: DC

## 2018-10-16 NOTE — Telephone Encounter (Signed)
Refills sent

## 2018-10-16 NOTE — Telephone Encounter (Signed)
Copied from [place] [id number]. Topic: Quick Communication - Rx Refill/Question >> Oct 16, 2018  3:44 PM Alanda Slim E wrote: Medication: metoprolol tartrate (LOPRESSOR) 50 MG tablet - 90 day supply  Has the patient contacted their pharmacy? Yes - call pcp and have Rx sent to new pharmacy   Preferred Pharmacy (with phone number or street name): [facility], [place] to [facility] [phone] (Phone) [phone] (Fax)    Agent: Please be advised that RX refills may take up to 3 business days. We ask that you follow-up with your pharmacy.

## 2018-11-12 ENCOUNTER — Other Ambulatory Visit: Payer: Self-pay | Admitting: Family Medicine

## 2018-11-12 MED ORDER — DILTIAZEM HCL ER COATED BEADS 240 MG PO CP24: 240.0000 mg | ORAL_CAPSULE | Freq: Every day | ORAL | 0 refills | Status: DC

## 2018-11-12 MED ORDER — CHLORTHALIDONE 25 MG PO TABS: 25.0000 mg | ORAL_TABLET | Freq: Every day | ORAL | 0 refills | Status: DC

## 2018-11-12 NOTE — Telephone Encounter (Signed)
Requested Prescriptions  Pending Prescriptions Disp Refills  . chlorthalidone (HYGROTON) 25 MG tablet 90 tablet 0    Sig: Take 1 tablet (25 mg total) by mouth daily.     Cardiovascular: Diuretics - Thiazide Failed - 11/12/2018  9:39 AM      Failed - Cr in normal range and within 360 days    Creatinine  Date Value Ref Range Status  02/24/2018 1.52 (H) 0.61 - 1.24 mg/dL Final  02/15/2017 1.4 (H) 0.7 - 1.3 mg/dL Final         Passed - Ca in normal range and within 360 days    Calcium  Date Value Ref Range Status  02/24/2018 9.4 8.9 - 10.3 mg/dL Final  02/15/2017 10.0 8.4 - 10.4 mg/dL Final         Passed - K in normal range and within 360 days    Potassium  Date Value Ref Range Status  02/24/2018 3.8 3.5 - 5.1 mmol/L Final  02/15/2017 4.4 3.5 - 5.1 mEq/L Final         Passed - Na in normal range and within 360 days    Sodium  Date Value Ref Range Status  02/24/2018 138 135 - 145 mmol/L Final  02/15/2017 137 136 - 145 mEq/L Final         Passed - Last BP in normal range    BP Readings from Last 1 Encounters:  08/12/18 126/78         Passed - Valid encounter within last 6 months    Recent Outpatient Visits          3 months ago [facility] [place], [place] R, DO   4 months ago Essential hypertension   [facility], Bonnita Levan, MD   5 months ago Preventative health care   [facility] [place], Bonnita Levan, MD   1 year ago Hyperglycemia   [facility] [place], MD   1 year ago [facility] [place], PA-C      Future Appointments            In 5 days [name], MD [place], [facility]         . diltiazem (CARDIZEM CD) 240 MG 24 hr capsule 90 capsule 0    Sig: Take 1 capsule  (240 mg total) by mouth daily.     Cardiovascular:  Calcium Channel Blockers Passed - 11/12/2018  9:39 AM      Passed - Last BP in normal range    BP Readings from Last 1 Encounters:  08/12/18 126/78         Passed - Valid encounter within last 6 months    Recent Outpatient Visits          3 months ago [facility] [place], DO   4 months ago Essential hypertension   [facility] [place], MD   5 months ago Preventative health care   [facility] [place], MD   1 year ago Hyperglycemia   [facility] [place], Bonnita Levan, MD  1 year ago [facility] [place], [employer]            In 5 days [name], MD [facility] at [place]

## 2018-11-17 ENCOUNTER — Telehealth: Payer: Self-pay | Admitting: Family Medicine

## 2018-11-17 ENCOUNTER — Other Ambulatory Visit: Payer: Self-pay

## 2018-11-17 ENCOUNTER — Ambulatory Visit: Payer: Medicare HMO | Admitting: Family Medicine

## 2018-11-17 NOTE — Telephone Encounter (Signed)
Pt came in office for his appt 11-17-2018, pt thought appt was in office, pt was informed it would be threw Tel (comfirmed with CMA). Pt stated wanted to cancel appt and will call to reschedule.

## 2018-11-20 ENCOUNTER — Ambulatory Visit: Payer: Medicare HMO | Admitting: *Deleted

## 2018-11-27 DIAGNOSIS — Z0100 Encounter for examination of eyes and vision without abnormal findings: Secondary | ICD-10-CM | POA: Diagnosis not present

## 2018-12-30 ENCOUNTER — Other Ambulatory Visit: Payer: Self-pay | Admitting: Family Medicine

## 2019-02-20 ENCOUNTER — Inpatient Hospital Stay: Payer: Medicare HMO | Attending: Internal Medicine

## 2019-02-20 ENCOUNTER — Other Ambulatory Visit: Payer: Self-pay

## 2019-02-20 ENCOUNTER — Encounter (HOSPITAL_COMMUNITY): Payer: Self-pay

## 2019-02-20 ENCOUNTER — Ambulatory Visit (HOSPITAL_COMMUNITY)
Admission: RE | Admit: 2019-02-20 | Discharge: 2019-02-20 | Disposition: A | Discharge status: Home / Self Care | Payer: Medicare HMO | Source: Ambulatory Visit | Attending: Internal Medicine | Admitting: Internal Medicine

## 2019-02-20 DIAGNOSIS — C3490 Malignant neoplasm of unspecified part of unspecified bronchus or lung: Secondary | ICD-10-CM | POA: Diagnosis not present

## 2019-02-20 DIAGNOSIS — C3432 Malignant neoplasm of lower lobe, left bronchus or lung: Secondary | ICD-10-CM | POA: Insufficient documentation

## 2019-02-20 DIAGNOSIS — I2510 Atherosclerotic heart disease of native coronary artery without angina pectoris: Secondary | ICD-10-CM | POA: Insufficient documentation

## 2019-02-20 DIAGNOSIS — C8301 Small cell B-cell lymphoma, lymph nodes of head, face, and neck: Secondary | ICD-10-CM | POA: Insufficient documentation

## 2019-02-20 DIAGNOSIS — I10 Essential (primary) hypertension: Secondary | ICD-10-CM | POA: Insufficient documentation

## 2019-02-20 DIAGNOSIS — R918 Other nonspecific abnormal finding of lung field: Secondary | ICD-10-CM | POA: Diagnosis not present

## 2019-02-20 DIAGNOSIS — K760 Fatty (change of) liver, not elsewhere classified: Secondary | ICD-10-CM | POA: Diagnosis not present

## 2019-02-20 LAB — CMP (CANCER CENTER ONLY)
ALT: 35 U/L (ref 0–44)
AST: 29 U/L (ref 15–41)
Albumin: 4.1 g/dL (ref 3.5–5.0)
Alkaline Phosphatase: 48 U/L (ref 38–126)
Anion gap: 11 (ref 5–15)
BUN: 25 mg/dL — ABNORMAL HIGH (ref 8–23)
CO2: 28 mmol/L (ref 22–32)
Calcium: 10.1 mg/dL (ref 8.9–10.3)
Chloride: 99 mmol/L (ref 98–111)
Creatinine: 1.64 mg/dL — ABNORMAL HIGH (ref 0.61–1.24)
GFR, Est AFR Am: 47 mL/min — ABNORMAL LOW (ref >60)
GFR, Estimated: 41 mL/min — ABNORMAL LOW (ref >60)
Glucose, Bld: 116 mg/dL — ABNORMAL HIGH (ref 70–99)
Potassium: 3.8 mmol/L (ref 3.5–5.1)
Sodium: 138 mmol/L (ref 135–145)
Total Bilirubin: 0.9 mg/dL (ref 0.3–1.2)
Total Protein: 7.4 g/dL (ref 6.5–8.1)

## 2019-02-20 LAB — CBC WITH DIFFERENTIAL (CANCER CENTER ONLY)
Abs Immature Granulocytes: 0.06 10*3/uL (ref 0.00–0.07)
Basophils Absolute: 0 10*3/uL (ref 0.0–0.1)
Basophils Relative: 0 %
Eosinophils Absolute: 0.1 10*3/uL (ref 0.0–0.5)
Eosinophils Relative: 1 %
HCT: 42.1 % (ref 39.0–52.0)
Hemoglobin: 14.4 g/dL (ref 13.0–17.0)
Immature Granulocytes: 1 %
Lymphocytes Relative: 38 %
Lymphs Abs: 3.5 10*3/uL (ref 0.7–4.0)
MCH: 32.8 pg (ref 26.0–34.0)
MCHC: 34.2 g/dL (ref 30.0–36.0)
MCV: 95.9 fL (ref 80.0–100.0)
Monocytes Absolute: 0.8 10*3/uL (ref 0.1–1.0)
Monocytes Relative: 8 %
Neutro Abs: 4.8 10*3/uL (ref 1.7–7.7)
Neutrophils Relative %: 52 %
Platelet Count: 176 10*3/uL (ref 150–400)
RBC: 4.39 MIL/uL (ref 4.22–5.81)
RDW: 12.4 % (ref 11.5–15.5)
WBC Count: 9.2 10*3/uL (ref 4.0–10.5)
nRBC: 0 % (ref 0.0–0.2)

## 2019-02-24 ENCOUNTER — Encounter: Payer: Self-pay | Admitting: Internal Medicine

## 2019-02-24 ENCOUNTER — Telehealth: Payer: Self-pay | Admitting: Internal Medicine

## 2019-02-24 ENCOUNTER — Inpatient Hospital Stay (HOSPITAL_BASED_OUTPATIENT_CLINIC_OR_DEPARTMENT_OTHER): Payer: Medicare HMO | Admitting: Internal Medicine

## 2019-02-24 ENCOUNTER — Other Ambulatory Visit: Payer: Self-pay

## 2019-02-24 VITALS — BP 159/78 | HR 75 | Temp 98.0°F | Resp 20 | Ht 70.0 in | Wt 230.3 lb

## 2019-02-24 DIAGNOSIS — I2510 Atherosclerotic heart disease of native coronary artery without angina pectoris: Secondary | ICD-10-CM | POA: Diagnosis not present

## 2019-02-24 DIAGNOSIS — C8301 Small cell B-cell lymphoma, lymph nodes of head, face, and neck: Secondary | ICD-10-CM

## 2019-02-24 DIAGNOSIS — I10 Essential (primary) hypertension: Secondary | ICD-10-CM | POA: Diagnosis not present

## 2019-02-24 DIAGNOSIS — C3490 Malignant neoplasm of unspecified part of unspecified bronchus or lung: Secondary | ICD-10-CM

## 2019-02-24 DIAGNOSIS — C8300 Small cell B-cell lymphoma, unspecified site: Secondary | ICD-10-CM

## 2019-02-24 DIAGNOSIS — C3432 Malignant neoplasm of lower lobe, left bronchus or lung: Secondary | ICD-10-CM

## 2019-02-24 NOTE — Progress Notes (Signed)
[place] [phone]   [phone]  OFFICE PROGRESS NOTE  [name], MD [place] [address]  DIAGNOSIS AND STAGE:  1) small lymphocytic lymphoma involving the left supraclavicular lymph nodes diagnosed in February 2016. 2) Stage IIA (T1a, N1, M0) non-small cell lung cancer consistent with adenocarcinoma with negative EGFR mutation diagnosed in March of 2013.   PRIOR THERAPY:  1) S/P left video-assisted thoracoscopy, wedge resection of left lower lobe nodule, thoracoscopic left lower lobectomy, mediastinal lymph node dissection on 11/02/2011.  2) Adjuvant chemotherapy with cisplatin 75 mg/M2 and Alimta 500 mg/M2 every 3 weeks. She is status post 4 cycles.   CURRENT THERAPY: Observation.  INTERVAL HISTORY: Kyle Long 73 y.o. male returns to the clinic today for annual follow-up visit.  The patient is feeling fine today with no concerning complaints.  He denied having any chest pain, shortness of breath, cough or hemoptysis.  He denied having any nausea, vomiting, diarrhea or constipation.  He has no headache or visual changes.  He had repeat CT scan of the chest, abdomen and pelvis performed recently and he is here today for evaluation and discussion of his scan results.  MEDICAL HISTORY: Past Medical History:  Diagnosis Date  . Allergic state 06/26/2015  . Amnesia 2009   isolated;for 30 mins w/elevated BP  . Arthritis   . Cancer ([place])    skin / top head  . Cellulitis 2011   LUE (no PMH of MRSA)/left elbow  . Diverticulosis   . Diverticulosis of colon   . GERD (gastroesophageal reflux disease)    OCC HEARTBURN  . Gilbert's syndrome   . Hematuria 2009   Dr Burnell Blanks  . Hyperglycemia   . Hyperlipidemia   . Hypertension   . Lung cancer, lower lobe ([facility])    Stage IIA (T1,N1)  . Neuromuscular disorder ([place])    2010 TEMPORARY MEMORY LOSS 1-1.5 HR WAS CHECKED OUT AT [facility] , NOTHING  FOUND  . Peptic ulcer 1995  . Peyronie disease   . Preventative health care 12/19/2015  . Primary cancer of left lower lobe of lung ([place]) 11/27/2011  . Recurrent upper respiratory infection (URI)    09/2011  BRONCHITIS  . SCC (squamous cell carcinoma) 01/21/2009   Qualifier: Diagnosis of  By: Linna Darner MD, Gwyndolyn Saxon   ? Basal Cell      ALLERGIES:  is allergic to lisinopril.  MEDICATIONS:  Current Outpatient Medications  Medication Sig Dispense Refill  . azithromycin (ZITHROMAX Z-PAK) 250 MG tablet As directed 6 each 0  . chlorthalidone (HYGROTON) 25 MG tablet TAKE 1 TABLET DAILY 90 tablet 0  . clobetasol cream (TEMOVATE) 0.05 %   3  . diltiazem (CARDIZEM CD) 240 MG 24 hr capsule TAKE 1 CAPSULE DAILY 90 capsule 0  . losartan (COZAAR) 100 MG tablet TAKE 1/2 TABLET EVERY DAY 45 tablet 3  . metoprolol tartrate (LOPRESSOR) 50 MG tablet Take 1 tablet (50 mg total) by mouth 2 (two) times daily. 180 tablet 1  . promethazine-dextromethorphan (PROMETHAZINE-DM) 6.25-15 MG/5ML syrup Take 5 mLs by mouth 4 (four) times daily as needed. 118 mL 0   No current facility-administered medications for this visit.     SURGICAL HISTORY:  Past Surgical History:  Procedure Laterality Date  . chemo treatment      4 treatments that last 7 hours each/last treatmebt in 2013  . COLONOSCOPY W/ POLYPECTOMY  2005   due 2015  .  CYSTOSCOPY  2009  . LOBECTOMY  11/02/2011   Procedure: LOBECTOMY;  Surgeon: Melrose Nakayama, MD;  Location: [facility];  Service: Thoracic;  Laterality: Left;  LEFT LOWER LOBECTOMY  . REFRACTIVE SURGERY     Bil  . SKIN BIOPSY     4 0r 5  . Squamous Cell Cancer Excision  12/13/2016  . SUPRACLAVICAL NODE BIOPSY Left 10/15/2014   Procedure: LEFT SUPRACLAVICAL LYMPH NODE BIOPSY;  Surgeon: Melrose Nakayama, MD;  Location: [facility];  Service: Thoracic;  Laterality: Left;  LEFT SUPRACLAVICAL LYMPH NODE BIOPSY  . VASECTOMY    . VEIN SURGERY     R ankle    REVIEW OF SYSTEMS:  A comprehensive  review of systems was negative.   PHYSICAL EXAMINATION: General appearance: alert, cooperative and no distress Head: Normocephalic, without obvious abnormality, atraumatic Neck: no adenopathy, no JVD, supple, symmetrical, trachea midline and thyroid not enlarged, symmetric, no tenderness/mass/nodules Lymph nodes: Cervical, supraclavicular, and axillary nodes normal. Resp: clear to auscultation bilaterally Back: symmetric, no curvature. ROM normal. No CVA tenderness. Cardio: regular rate and rhythm, S1, S2 normal, no murmur, click, rub or gallop GI: soft, non-tender; bowel sounds normal; no masses,  no organomegaly Extremities: extremities normal, atraumatic, no cyanosis or edema  ECOG PERFORMANCE STATUS: 1 - Symptomatic but completely ambulatory  Blood pressure (!) 159/78, pulse 75, temperature 98 F (36.7 C), temperature source Oral, resp. rate 20, height _0  (1.778 m), weight 230 lb 4.8 oz (104.5 kg), SpO2 98 %.  LABORATORY DATA: Lab Results  Component Value Date   WBC 9.2 02/20/2019   HGB 14.4 02/20/2019   HCT 42.1 02/20/2019   MCV 95.9 02/20/2019   PLT 176 02/20/2019      Chemistry      Component Value Date/Time   NA 138 02/20/2019 0955   NA 137 02/15/2017 1053   K 3.8 02/20/2019 0955   K 4.4 02/15/2017 1053   CL 99 02/20/2019 0955   CL 104 09/03/2012 1003   CO2 28 02/20/2019 0955   CO2 25 02/15/2017 1053   BUN 25 (H) 02/20/2019 0955   BUN 27.1 (H) 02/15/2017 1053   CREATININE 1.64 (H) 02/20/2019 0955   CREATININE 1.4 (H) 02/15/2017 1053      Component Value Date/Time   CALCIUM 10.1 02/20/2019 0955   CALCIUM 10.0 02/15/2017 1053   ALKPHOS 48 02/20/2019 0955   ALKPHOS 51 02/15/2017 1053   AST 29 02/20/2019 0955   AST 30 02/15/2017 1053   ALT 35 02/20/2019 0955   ALT 37 02/15/2017 1053   BILITOT 0.9 02/20/2019 0955   BILITOT 0.93 02/15/2017 1053       RADIOGRAPHIC STUDIES: Ct Abdomen Pelvis Wo Contrast  Result Date: 02/20/2019 CLINICAL DATA:   73 year old male with history of low-grade lymphoma diagnosed in 2016, as well as lung cancer diagnosed in 2013. Status post chemotherapy now complete. No current complaints. Follow-up study. EXAM: CT CHEST, ABDOMEN AND PELVIS WITHOUT CONTRAST TECHNIQUE: Multidetector CT imaging of the chest, abdomen and pelvis was performed following the standard protocol without IV contrast. COMPARISON:  CT the chest, abdomen and pelvis 01/25/2018. FINDINGS: CT CHEST FINDINGS Cardiovascular: Heart size is normal. There is no significant pericardial fluid, thickening or pericardial calcification. There is aortic atherosclerosis, as well as atherosclerosis of the great vessels of the mediastinum and the coronary arteries, including calcified atherosclerotic plaque in the left main, left anterior descending, left circumflex and right coronary arteries. Mediastinum/Nodes: No pathologically enlarged mediastinal or hilar lymph nodes. Please  note that accurate exclusion of hilar adenopathy is limited on noncontrast CT scans. Esophagus is unremarkable in appearance. No axillary lymphadenopathy. Lungs/Pleura: Status post left lower lobectomy. Compensatory hyperexpansion of the left upper lobe. Previously noted ground-glass attenuation nodule in the right upper lobe has increased in size and currently measures 1.2 x 1.1 x 1.2 cm (axial image 73 of series 6 and coronal image 70 of series 4), previously 0.9 x 0.9 x 1.0 cm on 02/24/2018. No appreciable solid component to this nodule at this time. No other definite suspicious pulmonary nodules or masses are noted. No acute consolidative airspace disease. No pleural effusions. Musculoskeletal: There are no aggressive appearing lytic or blastic lesions noted in the visualized portions of the skeleton. CT ABDOMEN PELVIS FINDINGS Hepatobiliary: Diffuse but heterogeneous areas of low attenuation throughout the hepatic parenchyma, indicative of hepatic steatosis. Multiple more well-defined  low-attenuation areas are noted in the liver, incompletely characterized on today's non-contrast CT examination, but similar to the prior study and likely to represent cysts and/or biliary hamartomas, largest of which is in segment 2 measuring 2.4 x 1.8 cm (axial image 54 of series 2). Gallbladder is normal in appearance. Pancreas: No definite pancreatic mass or peripancreatic fluid collections or inflammatory changes are noted on today's noncontrast CT examination. Spleen: Unremarkable. Adrenals/Urinary Tract: Unenhanced appearance of the kidneys and bilateral adrenal glands is normal. No hydroureteronephrosis. Urinary bladder is normal in appearance. Stomach/Bowel: Unenhanced appearance of the stomach is normal. Small duodenal diverticulum extending off the posterior aspect of the distal second portion of the duodenum, without surrounding inflammatory changes. No pathologic dilatation of small bowel or colon. Numerous colonic diverticulae, without surrounding inflammatory changes to suggest an acute diverticulitis at this time. Normal appendix. Vascular/Lymphatic: Aortic atherosclerosis. Multiple prominent borderline enlarged retroperitoneal and pelvic lymph nodes measuring up to 8 mm in short axis in the right common iliac nodal station, similar to the prior examination. Reproductive: Prostate gland and seminal vesicles are unremarkable in appearance. Other: No significant volume of ascites.  No pneumoperitoneum. Musculoskeletal: There are no aggressive appearing lytic or blastic lesions noted in the visualized portions of the skeleton. IMPRESSION: 1. Mild enlargement of ground-glass attenuation nodule in the right upper lobe which currently measures 1.2 x 1.1 x 1.2 cm. This remains concerning for potential slow-growing neoplasm such as a primary bronchogenic adenocarcinoma or precursor to adenocarcinoma. Continued attention on repeat follow-up chest CT is recommended in 1 year to re-evaluate this finding. 2. No  definite findings to suggest recurrent lymphoma identified in the chest, abdomen or pelvis. 3. Aortic atherosclerosis, in addition to left main and 3 vessel coronary artery disease. Assessment for potential risk factor modification, dietary therapy or pharmacologic therapy may be warranted, if clinically indicated. 4. Hepatic steatosis. 5. Additional incidental findings, as above. Electronically Signed   By: [name] M.D.   On: 02/20/2019 15:06   Ct Chest Wo Contrast  Result Date: 02/20/2019 CLINICAL DATA:  73 year old male with history of low-grade lymphoma diagnosed in 2016, as well as lung cancer diagnosed in 2013. Status post chemotherapy now complete. No current complaints. Follow-up study. EXAM: CT CHEST, ABDOMEN AND PELVIS WITHOUT CONTRAST TECHNIQUE: Multidetector CT imaging of the chest, abdomen and pelvis was performed following the standard protocol without IV contrast. COMPARISON:  CT the chest, abdomen and pelvis 01/25/2018. FINDINGS: CT CHEST FINDINGS Cardiovascular: Heart size is normal. There is no significant pericardial fluid, thickening or pericardial calcification. There is aortic atherosclerosis, as well as atherosclerosis of the great vessels of the mediastinum  and the coronary arteries, including calcified atherosclerotic plaque in the left main, left anterior descending, left circumflex and right coronary arteries. Mediastinum/Nodes: No pathologically enlarged mediastinal or hilar lymph nodes. Please note that accurate exclusion of hilar adenopathy is limited on noncontrast CT scans. Esophagus is unremarkable in appearance. No axillary lymphadenopathy. Lungs/Pleura: Status post left lower lobectomy. Compensatory hyperexpansion of the left upper lobe. Previously noted ground-glass attenuation nodule in the right upper lobe has increased in size and currently measures 1.2 x 1.1 x 1.2 cm (axial image 73 of series 6 and coronal image 70 of series 4), previously 0.9 x 0.9 x 1.0 cm on  02/24/2018. No appreciable solid component to this nodule at this time. No other definite suspicious pulmonary nodules or masses are noted. No acute consolidative airspace disease. No pleural effusions. Musculoskeletal: There are no aggressive appearing lytic or blastic lesions noted in the visualized portions of the skeleton. CT ABDOMEN PELVIS FINDINGS Hepatobiliary: Diffuse but heterogeneous areas of low attenuation throughout the hepatic parenchyma, indicative of hepatic steatosis. Multiple more well-defined low-attenuation areas are noted in the liver, incompletely characterized on today's non-contrast CT examination, but similar to the prior study and likely to represent cysts and/or biliary hamartomas, largest of which is in segment 2 measuring 2.4 x 1.8 cm (axial image 54 of series 2). Gallbladder is normal in appearance. Pancreas: No definite pancreatic mass or peripancreatic fluid collections or inflammatory changes are noted on today's noncontrast CT examination. Spleen: Unremarkable. Adrenals/Urinary Tract: Unenhanced appearance of the kidneys and bilateral adrenal glands is normal. No hydroureteronephrosis. Urinary bladder is normal in appearance. Stomach/Bowel: Unenhanced appearance of the stomach is normal. Small duodenal diverticulum extending off the posterior aspect of the distal second portion of the duodenum, without surrounding inflammatory changes. No pathologic dilatation of small bowel or colon. Numerous colonic diverticulae, without surrounding inflammatory changes to suggest an acute diverticulitis at this time. Normal appendix. Vascular/Lymphatic: Aortic atherosclerosis. Multiple prominent borderline enlarged retroperitoneal and pelvic lymph nodes measuring up to 8 mm in short axis in the right common iliac nodal station, similar to the prior examination. Reproductive: Prostate gland and seminal vesicles are unremarkable in appearance. Other: No significant volume of ascites.  No  pneumoperitoneum. Musculoskeletal: There are no aggressive appearing lytic or blastic lesions noted in the visualized portions of the skeleton. IMPRESSION: 1. Mild enlargement of ground-glass attenuation nodule in the right upper lobe which currently measures 1.2 x 1.1 x 1.2 cm. This remains concerning for potential slow-growing neoplasm such as a primary bronchogenic adenocarcinoma or precursor to adenocarcinoma. Continued attention on repeat follow-up chest CT is recommended in 1 year to re-evaluate this finding. 2. No definite findings to suggest recurrent lymphoma identified in the chest, abdomen or pelvis. 3. Aortic atherosclerosis, in addition to left main and 3 vessel coronary artery disease. Assessment for potential risk factor modification, dietary therapy or pharmacologic therapy may be warranted, if clinically indicated. 4. Hepatic steatosis. 5. Additional incidental findings, as above. Electronically Signed   By: [name] M.D.   On: 02/20/2019 15:06    ASSESSMENT AND PLAN: This is a very pleasant 73 years old white male with: 1) stage IIA non-small cell lung cancer, adenocarcinoma status post wedge resection of the left lower lobe with lymph node dissection in April 2013 followed by adjuvant systemic chemotherapy with cisplatin and Alimta.   The patient is currently on observation and feeling fine. 2) small lymphocytic lymphoma: He will continue on observation for now. He had repeat CT scan of the chest,  abdomen pelvis performed recently.  I personally and independently reviewed the scan images and discussed the results with the patient today. His scan showed no concerning findings for disease progression except for a slight increase of the groundglass opacity in the right upper lobe. I recommended for the patient to continue on observation with repeat CT scan of the chest in 1 year. For hypertension he was advised to monitor his blood pressure closely at home and to discuss with his  primary care physician for adjustment of medication if needed. The patient was advised to call immediately if he has any concerning symptoms in the interval. The patient voices understanding of current disease status and treatment options and is in agreement with the current care plan. All questions were answered. The patient knows to call the clinic with any problems, questions or concerns. We can certainly see the patient much sooner if necessary.  Disclaimer: This note was dictated with voice recognition software. Similar sounding words can inadvertently be transcribed and may not be corrected upon review.

## 2019-02-24 NOTE — Telephone Encounter (Signed)
Scheduled appt per 7/28 los - mailed letter with appt date and time . [facility] to contact pt with scan appt.

## 2019-04-04 ENCOUNTER — Other Ambulatory Visit: Payer: Self-pay | Admitting: Family Medicine

## 2019-04-21 ENCOUNTER — Other Ambulatory Visit: Payer: Self-pay

## 2019-04-21 ENCOUNTER — Ambulatory Visit (INDEPENDENT_AMBULATORY_CARE_PROVIDER_SITE_OTHER): Payer: Medicare HMO

## 2019-04-21 DIAGNOSIS — Z23 Encounter for immunization: Secondary | ICD-10-CM | POA: Diagnosis not present

## 2019-05-04 ENCOUNTER — Other Ambulatory Visit: Payer: Self-pay | Admitting: Family Medicine

## 2019-05-22 ENCOUNTER — Ambulatory Visit (INDEPENDENT_AMBULATORY_CARE_PROVIDER_SITE_OTHER): Payer: Medicare HMO | Admitting: Family Medicine

## 2019-05-22 ENCOUNTER — Encounter: Payer: Self-pay | Admitting: Family Medicine

## 2019-05-22 ENCOUNTER — Other Ambulatory Visit: Payer: Self-pay

## 2019-05-22 VITALS — BP 129/67 | Wt 230.0 lb

## 2019-05-22 DIAGNOSIS — N289 Disorder of kidney and ureter, unspecified: Secondary | ICD-10-CM

## 2019-05-22 DIAGNOSIS — E782 Mixed hyperlipidemia: Secondary | ICD-10-CM | POA: Diagnosis not present

## 2019-05-22 DIAGNOSIS — R739 Hyperglycemia, unspecified: Secondary | ICD-10-CM

## 2019-05-22 DIAGNOSIS — I10 Essential (primary) hypertension: Secondary | ICD-10-CM | POA: Diagnosis not present

## 2019-05-22 DIAGNOSIS — R351 Nocturia: Secondary | ICD-10-CM | POA: Diagnosis not present

## 2019-05-22 DIAGNOSIS — M1A9XX0 Chronic gout, unspecified, without tophus (tophi): Secondary | ICD-10-CM | POA: Diagnosis not present

## 2019-05-22 DIAGNOSIS — N401 Enlarged prostate with lower urinary tract symptoms: Secondary | ICD-10-CM

## 2019-05-22 MED ORDER — TAMSULOSIN HCL 0.4 MG PO CAPS: 0.4000 mg | ORAL_CAPSULE | Freq: Every day | ORAL | 1 refills | Status: DC

## 2019-05-22 MED ORDER — LOSARTAN POTASSIUM 100 MG PO TABS: ORAL_TABLET | ORAL | 3 refills | Status: DC

## 2019-05-24 DIAGNOSIS — R351 Nocturia: Secondary | ICD-10-CM | POA: Insufficient documentation

## 2019-05-24 NOTE — Assessment & Plan Note (Signed)
Hydrate and monitor 

## 2019-05-24 NOTE — Assessment & Plan Note (Signed)
Check PSA with upcoming labs.

## 2019-05-24 NOTE — Assessment & Plan Note (Signed)
No recent flares, maintain good hydration and avoid offending foods.

## 2019-05-24 NOTE — Progress Notes (Signed)
Virtual Visit via phone Note  I connected with Kyle Long on 05/22/19 at  8:40 AM EDT by a phone enabled telemedicine application and verified that I am speaking with the correct person using two identifiers.  Location: Patient: home Provider: home   I discussed the limitations of evaluation and management by telemedicine and the availability of in person appointments. The patient expressed understanding and agreed to proceed. Kyle Long, CMA was able to get the patient set up on a phone visit after being unable to set up a video visit   Subjective:    Patient ID: Kyle Long, male    DOB: [birth year], 73 y.o.   MRN: [id number]  No chief complaint on file.   HPI Patient is in today for follow up on chronic medical concerns including hypertension, hyperlipidemia, BPH and more. He feels well today. No recent febrile illness or hospitalizations. He is maintaining quarantine well. Is trying to eat a heart healthy diet and stay active. Denies CP/palp/SOB/HA/congestion/fevers/GI or GU c/o. Taking meds as prescribed  Past Medical History:  Diagnosis Date  . Allergic state 06/26/2015  . Amnesia 2009   isolated;for 30 mins w/elevated BP  . Arthritis   . Cancer ([place])    skin / top head  . Cellulitis 2011   LUE (no PMH of MRSA)/left elbow  . Diverticulosis   . Diverticulosis of colon   . GERD (gastroesophageal reflux disease)    OCC HEARTBURN  . Gilbert's syndrome   . Hematuria 2009   Dr Burnell Blanks  . Hyperglycemia   . Hyperlipidemia   . Hypertension   . Lung cancer, lower lobe (HCC)    Stage IIA (T1,N1)  . Neuromuscular disorder ([place])    2010 TEMPORARY MEMORY LOSS 1-1.5 HR WAS CHECKED OUT AT [facility] , NOTHING FOUND  . Peptic ulcer 1995  . Peyronie disease   . Preventative health care 12/19/2015  . Primary cancer of left lower lobe of lung ([place]) 11/27/2011  . Recurrent upper respiratory infection (URI)    09/2011  BRONCHITIS  . SCC (squamous cell carcinoma)  01/21/2009   Qualifier: Diagnosis of  By: Linna Darner MD, Gwyndolyn Saxon   ? Basal Cell      Past Surgical History:  Procedure Laterality Date  . chemo treatment      4 treatments that last 7 hours each/last treatmebt in 2013  . COLONOSCOPY W/ POLYPECTOMY  2005   due 2015  . CYSTOSCOPY  2009  . LOBECTOMY  11/02/2011   Procedure: LOBECTOMY;  Surgeon: Melrose Nakayama, MD;  Location: [facility];  Service: Thoracic;  Laterality: Left;  LEFT LOWER LOBECTOMY  . REFRACTIVE SURGERY     Bil  . SKIN BIOPSY     4 0r 5  . Squamous Cell Cancer Excision  12/13/2016  . SUPRACLAVICAL NODE BIOPSY Left 10/15/2014   Procedure: LEFT SUPRACLAVICAL LYMPH NODE BIOPSY;  Surgeon: Melrose Nakayama, MD;  Location: [facility];  Service: Thoracic;  Laterality: Left;  LEFT SUPRACLAVICAL LYMPH NODE BIOPSY  . VASECTOMY    . VEIN SURGERY     R ankle    Family History  Problem Relation Age of Onset  . Stroke Mother        in 32s  . Hypertension Mother   . Breast cancer Mother   . Neuropathy Mother   . Melanoma Father   . Heart attack Father 34       stents  . Dementia Father   . Hypertension Sister   .  Thyroid disease Sister        ??  . Multiple myeloma Sister   . Cancer Sister   . Stomach cancer Maternal Grandfather   . Lung cancer Maternal Grandfather   . Cancer Paternal Grandmother        lung, nonsmoker  . Headache Daughter   . Diabetes Paternal Aunt     Social History   Socioeconomic History  . Marital status: Married    Spouse name: Not on file  . Number of children: Not on file  . Years of education: Not on file  . Highest education level: Not on file  Occupational History  . Occupation: retired  Scientific laboratory technician  . Financial resource strain: Not on file  . Food insecurity    Worry: Not on file    Inability: Not on file  . Transportation needs    Medical: Not on file    Non-medical: Not on file  Tobacco Use  . Smoking status: Former Smoker    Packs/day: 1.00    Years: 15.00    Pack years:  15.00    Quit date: 07/31/1979    Years since quitting: 39.8  . Smokeless tobacco: Never Used  Substance and Sexual Activity  . Alcohol use: Yes    Comment: Daily 3 glasses of vodka  . Drug use: No  . Sexual activity: Not Currently    Comment: lives with wife, retired from [employer], ran distribution. No dietary restrictions.  Lifestyle  . Physical activity    Days per week: Not on file    Minutes per session: Not on file  . Stress: Not on file  Relationships  . Social Herbalist on phone: Not on file    Gets together: Not on file    Attends religious service: Not on file    Active member of club or organization: Not on file    Attends meetings of clubs or organizations: Not on file    Relationship status: Not on file  . Intimate partner violence    Fear of current or ex partner: Not on file    Emotionally abused: Not on file    Physically abused: Not on file    Forced sexual activity: Not on file  Other Topics Concern  . Not on file  Social History Narrative   Reg exercise    Outpatient Medications Prior to Visit  Medication Sig Dispense Refill  . chlorthalidone (HYGROTON) 25 MG tablet TAKE 1 TABLET DAILY 90 tablet 0  . diltiazem (CARDIZEM CD) 240 MG 24 hr capsule TAKE 1 CAPSULE DAILY 90 capsule 0  . metoprolol tartrate (LOPRESSOR) 50 MG tablet TAKE 1 TABLET TWICE A DAY 180 tablet 1  . losartan (COZAAR) 100 MG tablet TAKE 1/2 TABLET EVERY DAY 45 tablet 3   No facility-administered medications prior to visit.     Allergies  Allergen Reactions  . Lisinopril Cough    Review of Systems  Constitutional: Negative for fever and malaise/fatigue.  HENT: Negative for congestion.   Eyes: Negative for blurred vision.  Respiratory: Negative for shortness of breath.   Cardiovascular: Negative for chest pain, palpitations and leg swelling.  Gastrointestinal: Negative for abdominal pain, blood in stool and nausea.  Genitourinary: Negative for dysuria and  frequency.  Musculoskeletal: Negative for falls.  Skin: Negative for rash.  Neurological: Negative for dizziness, loss of consciousness and headaches.  Endo/Heme/Allergies: Negative for environmental allergies.  Psychiatric/Behavioral: Negative for depression. The patient is not nervous/anxious.  Objective:    Physical Exam unable to obtain via phone  BP 129/67 (BP Location: Left Arm, Patient Position: Sitting, Cuff Size: Normal)   Wt 230 lb (104.3 kg)   BMI 33.00 kg/m  Wt Readings from Last 3 Encounters:  05/22/19 230 lb (104.3 kg)  02/24/19 230 lb 4.8 oz (104.5 kg)  08/12/18 224 lb (101.6 kg)    Diabetic Foot Exam - Simple   No data filed     Lab Results  Component Value Date   WBC 9.2 02/20/2019   HGB 14.4 02/20/2019   HCT 42.1 02/20/2019   PLT 176 02/20/2019   GLUCOSE 116 (H) 02/20/2019   CHOL 186 05/19/2018   TRIG 389.0 (H) 05/19/2018   HDL 46.60 05/19/2018   LDLDIRECT 84.0 05/19/2018   LDLCALC 86 12/15/2015   ALT 35 02/20/2019   AST 29 02/20/2019   NA 138 02/20/2019   K 3.8 02/20/2019   CL 99 02/20/2019   CREATININE 1.64 (H) 02/20/2019   BUN 25 (H) 02/20/2019   CO2 28 02/20/2019   TSH 1.92 05/19/2018   PSA 2.46 05/19/2018   INR 1.03 10/12/2014   HGBA1C 5.7 05/19/2018    Lab Results  Component Value Date   TSH 1.92 05/19/2018   Lab Results  Component Value Date   WBC 9.2 02/20/2019   HGB 14.4 02/20/2019   HCT 42.1 02/20/2019   MCV 95.9 02/20/2019   PLT 176 02/20/2019   Lab Results  Component Value Date   NA 138 02/20/2019   K 3.8 02/20/2019   CHLORIDE 104 02/15/2017   CO2 28 02/20/2019   GLUCOSE 116 (H) 02/20/2019   BUN 25 (H) 02/20/2019   CREATININE 1.64 (H) 02/20/2019   BILITOT 0.9 02/20/2019   ALKPHOS 48 02/20/2019   AST 29 02/20/2019   ALT 35 02/20/2019   PROT 7.4 02/20/2019   ALBUMIN 4.1 02/20/2019   CALCIUM 10.1 02/20/2019   ANIONGAP 11 02/20/2019   EGFR 49 (L) 02/15/2017   GFR 50.27 (L) 07/11/2017   Lab Results   Component Value Date   CHOL 186 05/19/2018   Lab Results  Component Value Date   HDL 46.60 05/19/2018   Lab Results  Component Value Date   LDLCALC 86 12/15/2015   Lab Results  Component Value Date   TRIG 389.0 (H) 05/19/2018   Lab Results  Component Value Date   CHOLHDL 4 05/19/2018   Lab Results  Component Value Date   HGBA1C 5.7 05/19/2018       Assessment & Plan:   Problem List Items Addressed This Visit    Hyperlipidemia, mixed    Encouraged heart healthy diet, increase exercise, avoid trans fats, consider a krill oil cap daily      Relevant Medications   losartan (COZAAR) 100 MG tablet   Other Relevant Orders   Lipid panel   Essential hypertension - Primary    Monitor vitals weekly and report any concerns, no changes to meds. Encouraged heart healthy diet such as the DASH diet and exercise as tolerated.       Relevant Medications   losartan (COZAAR) 100 MG tablet   Other Relevant Orders   CBC   Comprehensive metabolic panel   TSH   Benign prostatic hyperplasia    Check PSA with upcoming labs.       Relevant Medications   tamsulosin (FLOMAX) 0.4 MG CAPS capsule   Other Relevant Orders   Testosterone   Renal insufficiency    Hydrate and monitor  Hyperglycemia    hgba1c acceptable, minimize simple carbs. Increase exercise as tolerated.       Gout    No recent flares, maintain good hydration and avoid offending foods.       Nocturia   Relevant Orders   PSA      I am having Kyle Long start on tamsulosin. I am also having him maintain his metoprolol tartrate, chlorthalidone, diltiazem, and losartan.  Meds ordered this encounter  Medications  . losartan (COZAAR) 100 MG tablet    Sig: TAKE 1/2 TABLET EVERY DAY    Dispense:  45 tablet    Refill:  3  . tamsulosin (FLOMAX) 0.4 MG CAPS capsule    Sig: Take 1 capsule (0.4 mg total) by mouth daily.    Dispense:  90 capsule    Refill:  1     I discussed the assessment and  treatment plan with the patient. The patient was provided an opportunity to ask questions and all were answered. The patient agreed with the plan and demonstrated an understanding of the instructions.   The patient was advised to call back or seek an in-person evaluation if the symptoms worsen or if the condition fails to improve as anticipated.  I provided 25 minutes of non-face-to-face time during this encounter.   [name], MD

## 2019-05-24 NOTE — Assessment & Plan Note (Signed)
hgba1c acceptable, minimize simple carbs. Increase exercise as tolerated.  

## 2019-05-24 NOTE — Assessment & Plan Note (Signed)
Encouraged heart healthy diet, increase exercise, avoid trans fats, consider a krill oil cap daily 

## 2019-05-24 NOTE — Assessment & Plan Note (Signed)
Monitor vitals weekly and report any concerns, no changes to meds. Encouraged heart healthy diet such as the DASH diet and exercise as tolerated.

## 2019-06-11 ENCOUNTER — Other Ambulatory Visit (INDEPENDENT_AMBULATORY_CARE_PROVIDER_SITE_OTHER): Payer: Medicare HMO

## 2019-06-11 ENCOUNTER — Other Ambulatory Visit: Payer: Self-pay

## 2019-06-11 DIAGNOSIS — I10 Essential (primary) hypertension: Secondary | ICD-10-CM | POA: Diagnosis not present

## 2019-06-11 DIAGNOSIS — N401 Enlarged prostate with lower urinary tract symptoms: Secondary | ICD-10-CM

## 2019-06-11 DIAGNOSIS — R351 Nocturia: Secondary | ICD-10-CM | POA: Diagnosis not present

## 2019-06-11 DIAGNOSIS — E782 Mixed hyperlipidemia: Secondary | ICD-10-CM

## 2019-06-11 LAB — CBC
HCT: 41.6 % (ref 39.0–52.0)
Hemoglobin: 14.3 g/dL (ref 13.0–17.0)
MCHC: 34.2 g/dL (ref 30.0–36.0)
MCV: 96.1 fl (ref 78.0–100.0)
Platelets: 190 10*3/uL (ref 150.0–400.0)
RBC: 4.33 Mil/uL (ref 4.22–5.81)
RDW: 12.9 % (ref 11.5–15.5)
WBC: 9.4 10*3/uL (ref 4.0–10.5)

## 2019-06-11 LAB — COMPREHENSIVE METABOLIC PANEL
ALT: 37 U/L (ref 0–53)
AST: 27 U/L (ref 0–37)
Albumin: 4.4 g/dL (ref 3.5–5.2)
Alkaline Phosphatase: 43 U/L (ref 39–117)
BUN: 23 mg/dL (ref 6–23)
CO2: 34 mEq/L — ABNORMAL HIGH (ref 19–32)
Calcium: 9.6 mg/dL (ref 8.4–10.5)
Chloride: 95 mEq/L — ABNORMAL LOW (ref 96–112)
Creatinine, Ser: 1.59 mg/dL — ABNORMAL HIGH (ref 0.40–1.50)
GFR: 42.64 mL/min — ABNORMAL LOW (ref >60.00)
Glucose, Bld: 154 mg/dL — ABNORMAL HIGH (ref 70–99)
Potassium: 3.6 mEq/L (ref 3.5–5.1)
Sodium: 137 mEq/L (ref 135–145)
Total Bilirubin: 0.8 mg/dL (ref 0.2–1.2)
Total Protein: 7 g/dL (ref 6.0–8.3)

## 2019-06-11 LAB — LIPID PANEL
Cholesterol: 188 mg/dL (ref 0–200)
HDL: 46.4 mg/dL (ref >39.00)
NonHDL: 141.2
Total CHOL/HDL Ratio: 4
Triglycerides: 324 mg/dL — ABNORMAL HIGH (ref 0.0–149.0)
VLDL: 64.8 mg/dL — ABNORMAL HIGH (ref 0.0–40.0)

## 2019-06-11 LAB — TESTOSTERONE: Testosterone: 142.15 ng/dL — ABNORMAL LOW (ref 300.00–890.00)

## 2019-06-11 LAB — PSA: PSA: 2.49 ng/mL (ref 0.10–4.00)

## 2019-06-11 LAB — LDL CHOLESTEROL, DIRECT: Direct LDL: 91 mg/dL

## 2019-06-11 LAB — TSH: TSH: 2.46 u[IU]/mL (ref 0.35–4.50)

## 2019-06-15 MED ORDER — ATORVASTATIN CALCIUM 10 MG PO TABS: 10.0000 mg | ORAL_TABLET | Freq: Every day | ORAL | 3 refills | Status: DC

## 2019-06-15 NOTE — Addendum Note (Signed)
Addended by: [name] A on: 06/15/2019 03:17 PM   Modules accepted: Orders

## 2019-07-14 DIAGNOSIS — L579 Skin changes due to chronic exposure to nonionizing radiation, unspecified: Secondary | ICD-10-CM | POA: Diagnosis not present

## 2019-07-14 DIAGNOSIS — L570 Actinic keratosis: Secondary | ICD-10-CM | POA: Diagnosis not present

## 2019-07-14 DIAGNOSIS — Z85828 Personal history of other malignant neoplasm of skin: Secondary | ICD-10-CM | POA: Diagnosis not present

## 2019-07-14 DIAGNOSIS — L814 Other melanin hyperpigmentation: Secondary | ICD-10-CM | POA: Diagnosis not present

## 2019-07-14 DIAGNOSIS — L281 Prurigo nodularis: Secondary | ICD-10-CM | POA: Diagnosis not present

## 2019-07-14 DIAGNOSIS — L400 Psoriasis vulgaris: Secondary | ICD-10-CM | POA: Diagnosis not present

## 2019-07-14 DIAGNOSIS — L821 Other seborrheic keratosis: Secondary | ICD-10-CM | POA: Diagnosis not present

## 2019-07-27 ENCOUNTER — Other Ambulatory Visit: Payer: Self-pay | Admitting: Family Medicine

## 2019-08-11 ENCOUNTER — Ambulatory Visit: Payer: Medicare Other | Attending: Internal Medicine

## 2019-08-11 DIAGNOSIS — Z23 Encounter for immunization: Secondary | ICD-10-CM | POA: Insufficient documentation

## 2019-08-11 NOTE — Progress Notes (Signed)
   Covid-19 Vaccination Clinic  Name:  Kyle Long    MRN: [id number] DOB: 1945/08/08  08/11/2019  [name] was observed post Covid-19 immunization for 15 minutes without incidence. He was provided with Vaccine Information Sheet and instruction to access the V-Safe system.   [name] was instructed to call 911 with any severe reactions post vaccine: [name] Kitchen Difficulty breathing  . Swelling of your face and throat  . A fast heartbeat  . A bad rash all over your body  . Dizziness and weakness    Immunizations Administered    Name Date Dose VIS Date Route   Pfizer COVID-19 Vaccine 08/11/2019 12:29 PM 0.3 mL 07/10/2019 Intramuscular   Manufacturer: [place]   Lot: [id number]   [place]: [id number]

## 2019-08-31 ENCOUNTER — Ambulatory Visit: Payer: Medicare HMO | Attending: Internal Medicine

## 2019-08-31 DIAGNOSIS — Z23 Encounter for immunization: Secondary | ICD-10-CM | POA: Insufficient documentation

## 2019-08-31 NOTE — Progress Notes (Signed)
   Covid-19 Vaccination Clinic  Name:  Kyle Long    MRN: [id number] DOB: March 25, 1946  08/31/2019  [name] was observed post Covid-19 immunization for 15 minutes without incidence. He was provided with Vaccine Information Sheet and instruction to access the V-Safe system.   [name] was instructed to call 911 with any severe reactions post vaccine: [name] Kitchen Difficulty breathing  . Swelling of your face and throat  . A fast heartbeat  . A bad rash all over your body  . Dizziness and weakness    Immunizations Administered    Name Date Dose VIS Date Route   Pfizer COVID-19 Vaccine 08/31/2019  9:33 AM 0.3 mL 07/10/2019 Intramuscular   Manufacturer: [place]   Lot: [id number]   [place]: [id number]

## 2019-09-28 DIAGNOSIS — Z961 Presence of intraocular lens: Secondary | ICD-10-CM | POA: Diagnosis not present

## 2019-09-28 DIAGNOSIS — H35371 Puckering of macula, right eye: Secondary | ICD-10-CM | POA: Diagnosis not present

## 2019-09-28 DIAGNOSIS — Z9889 Other specified postprocedural states: Secondary | ICD-10-CM | POA: Diagnosis not present

## 2019-09-28 DIAGNOSIS — R0683 Snoring: Secondary | ICD-10-CM | POA: Diagnosis not present

## 2019-09-29 ENCOUNTER — Other Ambulatory Visit: Payer: Self-pay | Admitting: Family Medicine

## 2019-10-25 ENCOUNTER — Other Ambulatory Visit: Payer: Self-pay | Admitting: Family Medicine

## 2019-11-23 ENCOUNTER — Encounter: Payer: Self-pay | Admitting: Internal Medicine

## 2019-12-23 ENCOUNTER — Other Ambulatory Visit: Payer: Self-pay

## 2019-12-23 ENCOUNTER — Ambulatory Visit (AMBULATORY_SURGERY_CENTER): Payer: Self-pay

## 2019-12-23 VITALS — Ht 70.0 in | Wt 236.0 lb

## 2019-12-23 DIAGNOSIS — Z86010 Personal history of colonic polyps: Secondary | ICD-10-CM

## 2019-12-23 MED ORDER — SUTAB 1479-225-188 MG PO TABS: 12.0000 | ORAL_TABLET | ORAL | 0 refills | Status: DC

## 2019-12-23 NOTE — Progress Notes (Signed)
No allergies to soy or egg Pt is not on blood thinners or diet pills Denies issues with sedation/intubation Denies atrial flutter/fib Denies constipation   Pt is aware of Covid safety and care partner requirements.      

## 2019-12-31 ENCOUNTER — Encounter: Payer: Self-pay | Admitting: Internal Medicine

## 2020-01-06 ENCOUNTER — Encounter: Payer: Self-pay | Admitting: Internal Medicine

## 2020-01-06 ENCOUNTER — Other Ambulatory Visit: Payer: Self-pay

## 2020-01-06 ENCOUNTER — Ambulatory Visit (AMBULATORY_SURGERY_CENTER): Payer: Medicare HMO | Admitting: Internal Medicine

## 2020-01-06 VITALS — BP 146/81 | HR 57 | Temp 97.3°F | Resp 28 | Ht 70.0 in | Wt 236.0 lb

## 2020-01-06 DIAGNOSIS — D125 Benign neoplasm of sigmoid colon: Secondary | ICD-10-CM | POA: Diagnosis not present

## 2020-01-06 DIAGNOSIS — Z86010 Personal history of colonic polyps: Secondary | ICD-10-CM

## 2020-01-06 DIAGNOSIS — D123 Benign neoplasm of transverse colon: Secondary | ICD-10-CM

## 2020-01-06 DIAGNOSIS — N183 Chronic kidney disease, stage 3 unspecified: Secondary | ICD-10-CM | POA: Diagnosis not present

## 2020-01-06 DIAGNOSIS — I129 Hypertensive chronic kidney disease with stage 1 through stage 4 chronic kidney disease, or unspecified chronic kidney disease: Secondary | ICD-10-CM | POA: Diagnosis not present

## 2020-01-06 MED ORDER — SODIUM CHLORIDE 0.9 % IV SOLN: 500.0000 mL | Freq: Once | INTRAVENOUS | Status: DC

## 2020-01-06 NOTE — Progress Notes (Signed)
LC - Check-in CW - VS   Pt's states no medical or surgical changes since previsit or office visit.maw

## 2020-01-06 NOTE — Progress Notes (Signed)
Report given to PACU, vss 

## 2020-01-06 NOTE — Op Note (Signed)
[facility] Patient Name: Kyle Long Procedure Date: 01/06/2020 11:57 AM MRN: [id number] Endoscopist: Jerene Bears , MD Age: 74 Referring MD:  Date of Birth: 18-Sep-1945 Gender: Male Account #: [id number] Procedure:                Colonoscopy Indications:              High risk colon cancer surveillance: Personal                            history of non-advanced adenoma, Last colonoscopy:                            March 2015 Medicines:                Monitored Anesthesia Care Procedure:                Pre-Anesthesia Assessment:                           - Prior to the procedure, a History and Physical                            was performed, and patient medications and                            allergies were reviewed. The patient's tolerance of                            previous anesthesia was also reviewed. The risks                            and benefits of the procedure and the sedation                            options and risks were discussed with the patient.                            All questions were answered, and informed consent                            was obtained. Prior Anticoagulants: The patient has                            taken no previous anticoagulant or antiplatelet                            agents. ASA Grade Assessment: III - A patient with                            severe systemic disease. After reviewing the risks                            and benefits, the patient was deemed in  satisfactory condition to undergo the procedure.                           After obtaining informed consent, the colonoscope                            was passed under direct vision. Throughout the                            procedure, the patient's blood pressure, pulse, and                            oxygen saturations were monitored continuously. The                            Colonoscope was introduced through the anus and                          advanced to the cecum, identified by appendiceal                            orifice and ileocecal valve. The colonoscopy was                            performed without difficulty. The patient tolerated                            the procedure well. The quality of the bowel                            preparation was good. The ileocecal valve,                            appendiceal orifice, and rectum were photographed. Scope In: 96:28:36 PM Scope Out: 12:23:13 PM Scope Withdrawal Time: 0 hours 12 minutes 53 seconds  Total Procedure Duration: 0 hours 16 minutes 54 seconds  Findings:                 The digital rectal exam was normal.                           Two sessile polyps were found in the transverse                            colon. The polyps were 3 to 4 mm in size. These                            polyps were removed with a cold snare. Resection                            and retrieval were complete.                           A 3 mm polyp was found in the sigmoid colon. The  polyp was sessile. The polyp was removed with a                            cold snare. Resection and retrieval were complete.                           Many small and large-mouthed diverticula were found                            from cecum to sigmoid colon.                           Internal hemorrhoids were found during                            retroflexion. The hemorrhoids were small. Complications:            No immediate complications. Estimated Blood Loss:     Estimated blood loss was minimal. Impression:               - Two 3 to 4 mm polyps in the transverse colon,                            removed with a cold snare. Resected and retrieved.                           - One 3 mm polyp in the sigmoid colon, removed with                            a cold snare. Resected and retrieved.                           - Severe diverticulosis from cecum to  sigmoid colon.                           - Small internal hemorrhoids. Recommendation:           - Patient has a contact number available for                            emergencies. The signs and symptoms of potential                            delayed complications were discussed with the                            patient. Return to normal activities tomorrow.                            Written discharge instructions were provided to the                            patient.                           -  Resume previous diet.                           - Continue present medications.                           - Await pathology results.                           - No repeat colonoscopy due to age at next                            surveillance interval. Jerene Bears, MD 01/06/2020 12:27:51 PM This report has been signed electronically.

## 2020-01-06 NOTE — Patient Instructions (Signed)
Handout given:  Polyps, Diverticulosis, hemorrhoids Resume previous diet Continue present medications Await pathology results   YOU HAD AN ENDOSCOPIC PROCEDURE TODAY AT [facility]:   Refer to the procedure report that was given to you for any specific questions about what was found during the examination.  If the procedure report does not answer your questions, please call your gastroenterologist to clarify.  If you requested that your care partner not be given the details of your procedure findings, then the procedure report has been included in a sealed envelope for you to review at your convenience later.  YOU SHOULD EXPECT: Some feelings of bloating in the abdomen. Passage of more gas than usual.  Walking can help get rid of the air that was put into your GI tract during the procedure and reduce the bloating. If you had a lower endoscopy (such as a colonoscopy or flexible sigmoidoscopy) you may notice spotting of blood in your stool or on the toilet paper. If you underwent a bowel prep for your procedure, you may not have a normal bowel movement for a few days.  Please Note:  You might notice some irritation and congestion in your nose or some drainage.  This is from the oxygen used during your procedure.  There is no need for concern and it should clear up in a day or so.  SYMPTOMS TO REPORT IMMEDIATELY:   Following lower endoscopy (colonoscopy or flexible sigmoidoscopy):  Excessive amounts of blood in the stool  Significant tenderness or worsening of abdominal pains  Swelling of the abdomen that is new, acute  Fever of 100F or higher   For urgent or emergent issues, a gastroenterologist can be reached at any hour by calling [phone]. Do not use MyChart messaging for urgent concerns.    DIET:  We do recommend a small meal at first, but then you may proceed to your regular diet.  Drink plenty of fluids but you should avoid alcoholic beverages for 24  hours.  ACTIVITY:  You should plan to take it easy for the rest of today and you should NOT DRIVE or use heavy machinery until tomorrow (because of the sedation medicines used during the test).    FOLLOW UP: Our staff will call the number listed on your records 48-72 hours following your procedure to check on you and address any questions or concerns that you may have regarding the information given to you following your procedure. If we do not reach you, we will leave a message.  We will attempt to reach you two times.  During this call, we will ask if you have developed any symptoms of COVID 19. If you develop any symptoms (ie: fever, flu-like symptoms, shortness of breath, cough etc.) before then, please call [phone].  If you test positive for Covid 19 in the 2 weeks post procedure, please call and report this information to [place].    If any biopsies were taken you will be contacted by phone or by letter within the next 1-3 weeks.  Please call us at [phone] if you have not heard about the biopsies in 3 weeks.    SIGNATURES/CONFIDENTIALITY: You and/or your care partner have signed paperwork which will be entered into your electronic medical record.  These signatures attest to the fact that that the information above on your After Visit Summary has been reviewed and is understood.  Full responsibility of the confidentiality of this discharge information lies with you and/or your care-partner.

## 2020-01-06 NOTE — Progress Notes (Signed)
Called to room to assist during endoscopic procedure.  Patient ID and intended procedure confirmed with present staff. Received instructions for my participation in the procedure from the performing physician.  

## 2020-01-08 ENCOUNTER — Encounter: Payer: Self-pay | Admitting: Internal Medicine

## 2020-01-08 ENCOUNTER — Telehealth: Payer: Self-pay | Admitting: *Deleted

## 2020-01-08 NOTE — Telephone Encounter (Signed)
  Follow up Call-  Call back number 01/06/2020  Post procedure Call Back phone  # [phone] hm  Permission to leave phone message Yes  Some recent data might be hidden     Patient questions:   Follow up Call-  Call back number 01/06/2020  Post procedure Call Back phone  # [phone] hm  Permission to leave phone message Yes  Some recent data might be hidden     Patient questions:  Do you have a fever, pain , or abdominal swelling? No. Pain Score  0 *  Have you tolerated food without any problems? Yes.    Have you been able to return to your normal activities? Yes.    Do you have any questions about your discharge instructions: Diet   No. Medications  No. Follow up visit  No.  Do you have questions or concerns about your Care? No.  Actions: * If pain score is 4 or above: No action needed, pain <4.   1. Have you developed a fever since your procedure? no  2.   Have you had an respiratory symptoms (SOB or cough) since your procedure? no  3.   Have you tested positive for COVID 19 since your procedure no  4.   Have you had any family members/close contacts diagnosed with the COVID 19 since your procedure?  no   If yes to any of these questions please route to Joylene John, RN and Erenest Rasher, RN

## 2020-01-23 ENCOUNTER — Other Ambulatory Visit: Payer: Self-pay | Admitting: Family Medicine

## 2020-02-22 ENCOUNTER — Other Ambulatory Visit: Payer: Self-pay

## 2020-02-22 ENCOUNTER — Inpatient Hospital Stay: Payer: Medicare HMO | Attending: Internal Medicine

## 2020-02-22 ENCOUNTER — Ambulatory Visit (HOSPITAL_COMMUNITY)
Admission: RE | Admit: 2020-02-22 | Discharge: 2020-02-22 | Disposition: A | Discharge status: Home / Self Care | Payer: Medicare HMO | Source: Ambulatory Visit | Attending: Internal Medicine | Admitting: Internal Medicine

## 2020-02-22 DIAGNOSIS — M47814 Spondylosis without myelopathy or radiculopathy, thoracic region: Secondary | ICD-10-CM | POA: Diagnosis not present

## 2020-02-22 DIAGNOSIS — C3490 Malignant neoplasm of unspecified part of unspecified bronchus or lung: Secondary | ICD-10-CM | POA: Diagnosis not present

## 2020-02-22 DIAGNOSIS — Z8572 Personal history of non-Hodgkin lymphomas: Secondary | ICD-10-CM | POA: Insufficient documentation

## 2020-02-22 DIAGNOSIS — N189 Chronic kidney disease, unspecified: Secondary | ICD-10-CM | POA: Insufficient documentation

## 2020-02-22 DIAGNOSIS — E785 Hyperlipidemia, unspecified: Secondary | ICD-10-CM | POA: Insufficient documentation

## 2020-02-22 DIAGNOSIS — Z79899 Other long term (current) drug therapy: Secondary | ICD-10-CM | POA: Diagnosis not present

## 2020-02-22 DIAGNOSIS — I10 Essential (primary) hypertension: Secondary | ICD-10-CM | POA: Insufficient documentation

## 2020-02-22 DIAGNOSIS — Z902 Acquired absence of lung [part of]: Secondary | ICD-10-CM | POA: Insufficient documentation

## 2020-02-22 DIAGNOSIS — Z85118 Personal history of other malignant neoplasm of bronchus and lung: Secondary | ICD-10-CM | POA: Insufficient documentation

## 2020-02-22 DIAGNOSIS — I700 Atherosclerosis of aorta: Secondary | ICD-10-CM | POA: Diagnosis not present

## 2020-02-22 DIAGNOSIS — I2510 Atherosclerotic heart disease of native coronary artery without angina pectoris: Secondary | ICD-10-CM | POA: Diagnosis not present

## 2020-02-22 LAB — CMP (CANCER CENTER ONLY)
ALT: 38 U/L (ref 0–44)
AST: 33 U/L (ref 15–41)
Albumin: 4 g/dL (ref 3.5–5.0)
Alkaline Phosphatase: 49 U/L (ref 38–126)
Anion gap: 12 (ref 5–15)
BUN: 23 mg/dL (ref 8–23)
CO2: 27 mmol/L (ref 22–32)
Calcium: 10.3 mg/dL (ref 8.9–10.3)
Chloride: 97 mmol/L — ABNORMAL LOW (ref 98–111)
Creatinine: 1.59 mg/dL — ABNORMAL HIGH (ref 0.61–1.24)
GFR, Est AFR Am: 48 mL/min — ABNORMAL LOW (ref >60)
GFR, Estimated: 42 mL/min — ABNORMAL LOW (ref >60)
Glucose, Bld: 124 mg/dL — ABNORMAL HIGH (ref 70–99)
Potassium: 3.6 mmol/L (ref 3.5–5.1)
Sodium: 136 mmol/L (ref 135–145)
Total Bilirubin: 1.1 mg/dL (ref 0.3–1.2)
Total Protein: 7.5 g/dL (ref 6.5–8.1)

## 2020-02-22 LAB — LACTATE DEHYDROGENASE: LDH: 174 U/L (ref 98–192)

## 2020-02-22 LAB — CBC WITH DIFFERENTIAL (CANCER CENTER ONLY)
Abs Immature Granulocytes: 0.14 10*3/uL — ABNORMAL HIGH (ref 0.00–0.07)
Basophils Absolute: 0.1 10*3/uL (ref 0.0–0.1)
Basophils Relative: 1 %
Eosinophils Absolute: 0.1 10*3/uL (ref 0.0–0.5)
Eosinophils Relative: 1 %
HCT: 43 % (ref 39.0–52.0)
Hemoglobin: 14.7 g/dL (ref 13.0–17.0)
Immature Granulocytes: 1 %
Lymphocytes Relative: 33 %
Lymphs Abs: 3.3 10*3/uL (ref 0.7–4.0)
MCH: 32.2 pg (ref 26.0–34.0)
MCHC: 34.2 g/dL (ref 30.0–36.0)
MCV: 94.3 fL (ref 80.0–100.0)
Monocytes Absolute: 0.8 10*3/uL (ref 0.1–1.0)
Monocytes Relative: 8 %
Neutro Abs: 5.5 10*3/uL (ref 1.7–7.7)
Neutrophils Relative %: 56 %
Platelet Count: 183 10*3/uL (ref 150–400)
RBC: 4.56 MIL/uL (ref 4.22–5.81)
RDW: 12.1 % (ref 11.5–15.5)
WBC Count: 9.9 10*3/uL (ref 4.0–10.5)
nRBC: 0 % (ref 0.0–0.2)

## 2020-02-24 ENCOUNTER — Encounter: Payer: Self-pay | Admitting: Internal Medicine

## 2020-02-24 ENCOUNTER — Inpatient Hospital Stay (HOSPITAL_BASED_OUTPATIENT_CLINIC_OR_DEPARTMENT_OTHER): Payer: Medicare HMO | Admitting: Internal Medicine

## 2020-02-24 ENCOUNTER — Other Ambulatory Visit: Payer: Self-pay

## 2020-02-24 VITALS — BP 148/70 | HR 85 | Temp 97.5°F | Resp 20 | Ht 70.0 in | Wt 232.6 lb

## 2020-02-24 DIAGNOSIS — Z8572 Personal history of non-Hodgkin lymphomas: Secondary | ICD-10-CM | POA: Diagnosis not present

## 2020-02-24 DIAGNOSIS — E785 Hyperlipidemia, unspecified: Secondary | ICD-10-CM | POA: Diagnosis not present

## 2020-02-24 DIAGNOSIS — Z85118 Personal history of other malignant neoplasm of bronchus and lung: Secondary | ICD-10-CM | POA: Diagnosis not present

## 2020-02-24 DIAGNOSIS — C3432 Malignant neoplasm of lower lobe, left bronchus or lung: Secondary | ICD-10-CM

## 2020-02-24 DIAGNOSIS — Z79899 Other long term (current) drug therapy: Secondary | ICD-10-CM | POA: Diagnosis not present

## 2020-02-24 DIAGNOSIS — I10 Essential (primary) hypertension: Secondary | ICD-10-CM

## 2020-02-24 DIAGNOSIS — C3490 Malignant neoplasm of unspecified part of unspecified bronchus or lung: Secondary | ICD-10-CM | POA: Diagnosis not present

## 2020-02-24 DIAGNOSIS — Z902 Acquired absence of lung [part of]: Secondary | ICD-10-CM | POA: Diagnosis not present

## 2020-02-24 DIAGNOSIS — N189 Chronic kidney disease, unspecified: Secondary | ICD-10-CM | POA: Diagnosis not present

## 2020-02-24 NOTE — Progress Notes (Signed)
[facility] [phone]) [phone]   [phone]  OFFICE PROGRESS NOTE  [name], MD [place] [address]  DIAGNOSIS AND STAGE:  1) small lymphocytic lymphoma involving the left supraclavicular lymph nodes diagnosed in February 2016. 2) Stage IIA (T1a, N1, M0) non-small cell lung cancer consistent with adenocarcinoma with negative EGFR mutation diagnosed in March of 2013.   PRIOR THERAPY:  1) S/P left video-assisted thoracoscopy, wedge resection of left lower lobe nodule, thoracoscopic left lower lobectomy, mediastinal lymph node dissection on 11/02/2011.  2) Adjuvant chemotherapy with cisplatin 75 mg/M2 and Alimta 500 mg/M2 every 3 weeks. She is status post 4 cycles.   CURRENT THERAPY: Observation.  INTERVAL HISTORY: Kyle Long 74 y.o. male returns to the clinic today for follow-up visit accompanied by his wife.  The patient is feeling fine today with no concerning complaints.  He denied having any weight loss or night sweats.  He has no nausea, vomiting, diarrhea or constipation.  He has no headache or visual changes.  The patient denied having any chest pain, shortness of breath, cough or hemoptysis.  He had repeat CT scan of the chest performed recently and he is here for evaluation and discussion of his discuss results.   MEDICAL HISTORY: Past Medical History:  Diagnosis Date  . Allergic state 06/26/2015  . Amnesia 2009   isolated;for 30 mins w/elevated BP  . Arthritis   . Cancer ([place])    skin / top head  . Cataract    bilateral repair  . Cellulitis 2011   LUE (no PMH of MRSA)/left elbow  . Chronic kidney disease    resolved  . Diverticulosis   . Diverticulosis of colon   . GERD (gastroesophageal reflux disease)    OCC HEARTBURN  . Gilbert's syndrome   . Hematuria 2009   Dr Burnell Blanks  . Hyperglycemia   . Hyperlipidemia   . Hypertension   . Lung cancer, lower lobe (HCC)    Stage IIA (T1,N1)   . Lymphoma ([place])    Low grad  . Neuromuscular disorder ([place])    2010 TEMPORARY MEMORY LOSS 1-1.5 HR WAS CHECKED OUT AT [facility] , NOTHING FOUND  . Peptic ulcer 1995  . Peyronie disease   . Preventative health care 12/19/2015  . Primary cancer of left lower lobe of lung ([place] [facility]) 11/27/2011  . Recurrent upper respiratory infection (URI)    09/2011  BRONCHITIS  . SCC (squamous cell carcinoma) 01/21/2009   Qualifier: Diagnosis of  By: Linna Darner MD, Gwyndolyn Saxon   ? Basal Cell      ALLERGIES:  is allergic to lisinopril.  MEDICATIONS:  Current Outpatient Medications  Medication Sig Dispense Refill  . Ascorbic Acid (VITAMIN C PO) Take by mouth.    . chlorthalidone (HYGROTON) 25 MG tablet TAKE 1 TABLET DAILY 90 tablet 0  . diltiazem (CARDIZEM CD) 240 MG 24 hr capsule TAKE 1 CAPSULE DAILY 90 capsule 0  . losartan (COZAAR) 100 MG tablet TAKE 1/2 TABLET EVERY DAY 45 tablet 3  . metoprolol tartrate (LOPRESSOR) 50 MG tablet TAKE 1 TABLET TWICE A DAY (Patient taking differently: Take 25 mg by mouth 2 (two) times daily. 1/2 tablet BID) 180 tablet 1  . VITAMIN D PO Take by mouth.     No current facility-administered medications for this visit.    SURGICAL HISTORY:  Past Surgical History:  Procedure Laterality Date  . chemo treatment  4 treatments that last 7 hours each/last treatmebt in 2013  . COLONOSCOPY  2015  . COLONOSCOPY W/ POLYPECTOMY  2005   due 2015  . CYSTOSCOPY  2009  . LOBECTOMY  11/02/2011   Procedure: LOBECTOMY;  Surgeon: Melrose Nakayama, MD;  Location: [facility];  Service: Thoracic;  Laterality: Left;  LEFT LOWER LOBECTOMY  . REFRACTIVE SURGERY     Bil  . SKIN BIOPSY     4 0r 5  . Squamous Cell Cancer Excision  12/13/2016  . SUPRACLAVICAL NODE BIOPSY Left 10/15/2014   Procedure: LEFT SUPRACLAVICAL LYMPH NODE BIOPSY;  Surgeon: Melrose Nakayama, MD;  Location: [facility];  Service: Thoracic;  Laterality: Left;  LEFT SUPRACLAVICAL LYMPH NODE BIOPSY  . VASECTOMY    . VEIN SURGERY      R ankle    REVIEW OF SYSTEMS:  A comprehensive review of systems was negative.   PHYSICAL EXAMINATION: General appearance: alert, cooperative and no distress Head: Normocephalic, without obvious abnormality, atraumatic Neck: no adenopathy, no JVD, supple, symmetrical, trachea midline and thyroid not enlarged, symmetric, no tenderness/mass/nodules Lymph nodes: Cervical, supraclavicular, and axillary nodes normal. Resp: clear to auscultation bilaterally Back: symmetric, no curvature. ROM normal. No CVA tenderness. Cardio: regular rate and rhythm, S1, S2 normal, no murmur, click, rub or gallop GI: soft, non-tender; bowel sounds normal; no masses,  no organomegaly Extremities: extremities normal, atraumatic, no cyanosis or edema  ECOG PERFORMANCE STATUS: 1 - Symptomatic but completely ambulatory  Blood pressure (!) 148/70, pulse 85, temperature (!) 97.5 F (36.4 C), temperature source Temporal, resp. rate 20, height 5' 10"  (1.778 m), weight (!) 232 lb 9.6 oz (105.5 kg), SpO2 99 %.  LABORATORY DATA: Lab Results  Component Value Date   WBC 9.9 02/22/2020   HGB 14.7 02/22/2020   HCT 43.0 02/22/2020   MCV 94.3 02/22/2020   PLT 183 02/22/2020      Chemistry      Component Value Date/Time   NA 136 02/22/2020 0945   NA 137 02/15/2017 1053   K 3.6 02/22/2020 0945   K 4.4 02/15/2017 1053   CL 97 (L) 02/22/2020 0945   CL 104 09/03/2012 1003   CO2 27 02/22/2020 0945   CO2 25 02/15/2017 1053   BUN 23 02/22/2020 0945   BUN 27.1 (H) 02/15/2017 1053   CREATININE 1.59 (H) 02/22/2020 0945   CREATININE 1.4 (H) 02/15/2017 1053      Component Value Date/Time   CALCIUM 10.3 02/22/2020 0945   CALCIUM 10.0 02/15/2017 1053   ALKPHOS 49 02/22/2020 0945   ALKPHOS 51 02/15/2017 1053   AST 33 02/22/2020 0945   AST 30 02/15/2017 1053   ALT 38 02/22/2020 0945   ALT 37 02/15/2017 1053   BILITOT 1.1 02/22/2020 0945   BILITOT 0.93 02/15/2017 1053       RADIOGRAPHIC STUDIES: CT Chest Wo  Contrast  Result Date: 02/22/2020 CLINICAL DATA:  restaging non-small cell lung cancer. EXAM: CT CHEST WITHOUT CONTRAST TECHNIQUE: Multidetector CT imaging of the chest was performed following the standard protocol without IV contrast. COMPARISON:  02/20/2019 FINDINGS: Cardiovascular: The heart size is normal. There is no pericardial effusion identified. Aortic atherosclerosis. Left main, lad, and RCA coronary artery calcifications identified. Mediastinum/Nodes: Normal appearance of the thyroid gland. The mediastinum is noted to be shifted into the left hemithorax. Unchanged from previous exam reflecting postsurgical changes in volume loss from left lower lobectomy. No enlarged supraclavicular, axillary, mediastinal lymph nodes. Hilar lymph nodes are suboptimally evaluated due to  lack of IV contrast material. Lungs/Pleura: Status post left lower lobectomy. No pleural effusion, airspace consolidation or atelectasis identified at this time. No suspicious pulmonary nodule or mass identified within the left lung. Several scattered calcified nodules within the right upper lobe compatible with old granulomas. Within the right upper lobe there is a sub solid nodule which measures 0.9 x 0.9 by 1.0 cm, image 70/5. Previously this measured 0.7 x 0.6 by 1.0 cm. Upper Abdomen: No acute abnormality. Unchanged low-attenuation structures within left lobe of liver which are favored to represent simple cysts. Diffuse hepatic steatosis noted. Musculoskeletal: Thoracic spondylosis. There are no aggressive lytic or sclerotic bone lesions identified. IMPRESSION: 1. Stable CT of the chest status post left lower lobectomy. No findings to suggest local tumor recurrence or metastatic disease. 2. There is a sub solid nodule within the right upper lobe which is slightly increased in size from previous exam. Cannot exclude indolent pulmonary adenocarcinoma. 3. Left main, 3 vessel coronary artery calcifications noted. 4. Hepatic steatosis. 5.  Aortic atherosclerosis. Aortic Atherosclerosis (ICD10-I70.0). Electronically Signed   By: Kerby Moors M.D.   On: 02/22/2020 12:55    ASSESSMENT AND PLAN: This is a very pleasant 74 years old white male with: 1) stage IIA non-small cell lung cancer, adenocarcinoma status post wedge resection of the left lower lobe with lymph node dissection in April 2013 followed by adjuvant systemic chemotherapy with cisplatin and Alimta.   The patient is currently on observation and feeling fine. 2) small lymphocytic lymphoma: He will continue on observation for now. The patient had CT scan of the chest performed recently.  I personally and independently reviewed the scans and discussed the results with the patient and his wife. His scan showed no concerning findings for disease progression except for slight increase in the size of the right upper lobe nodule. I gave the patient the option of repeating CT scan of the chest in 6 months but he would like to continue on observation with repeat scan in 1 year. He will have CT scan of the chest, abdomen pelvis at that time for evaluation of the right upper lobe pulmonary nodule as well as the indolent follicular lymphoma. He was advised to call immediately if he has any concerning symptoms in the interval. The patient voices understanding of current disease status and treatment options and is in agreement with the current care plan. All questions were answered. The patient knows to call the clinic with any problems, questions or concerns. We can certainly see the patient much sooner if necessary.  Disclaimer: This note was dictated with voice recognition software. Similar sounding words can inadvertently be transcribed and may not be corrected upon review.

## 2020-03-03 ENCOUNTER — Telehealth: Payer: Self-pay | Admitting: Family Medicine

## 2020-03-04 NOTE — Progress Notes (Signed)
I connected with Michiel today by telephone and verified that I am speaking with the correct person using two identifiers. Location patient: home Location provider: work Persons participating in the virtual visit: patient, Marine scientist.    I discussed the limitations, risks, security and privacy concerns of performing an evaluation and management service by telephone and the availability of in person appointments. I also discussed with the patient that there may be a patient responsible charge related to this service. The patient expressed understanding and verbally consented to this telephonic visit.    Interactive audio and video telecommunications were attempted between this provider and patient, however failed, due to patient having technical difficulties OR patient did not have access to video capability.  We continued and completed visit with audio only.  Some vital signs may be absent or patient reported.     Subjective:   Kyle Long is a 74 y.o. male who presents for Medicare Annual/Subsequent preventive examination.  Review of Systems     Cardiac Risk Factors include: advanced age (>16mn, >>39women);dyslipidemia;diabetes mellitus;hypertension;male gender     Objective:    Today's Vitals   03/07/20 1351  BP: 130/72  Pulse: 80  Weight: 233 lb (105.7 kg)   Body mass index is 33.43 kg/m.  Advanced Directives 03/07/2020 02/24/2020 02/26/2018 11/18/2017 02/18/2017 11/16/2016 11/15/2015  Does Patient Have a Medical Advance Directive? Yes Yes Yes Yes Yes Yes Yes  Type of AParamedicof ADevilleLiving will Living will;Healthcare Power of ASt. CharlesLiving will HBlountLiving will HKentonLiving will HMountain CityLiving will HLake of the WoodsLiving will  Does patient want to make changes to medical advance directive? No - Patient declined - - No - Patient declined - - No  - Patient declined  Copy of HC[place] Chart? Yes - validated most recent copy scanned in chart (See row information) - No - copy requested Yes No - copy requested - Yes  Pre-existing out of facility DNR order (yellow form or pink MOST form) - - - - - - -    Current Medications (verified) Outpatient Encounter Medications as of 03/07/2020  Medication Sig  . Ascorbic Acid (VITAMIN C PO) Take by mouth.  . chlorthalidone (HYGROTON) 25 MG tablet TAKE 1 TABLET DAILY  . diltiazem (CARDIZEM CD) 240 MG 24 hr capsule TAKE 1 CAPSULE DAILY  . losartan (COZAAR) 100 MG tablet TAKE 1/2 TABLET EVERY DAY  . metoprolol tartrate (LOPRESSOR) 50 MG tablet TAKE 1 TABLET TWICE A DAY (Patient taking differently: Take 25 mg by mouth 2 (two) times daily. 1/2 tablet BID)  . VITAMIN D PO Take by mouth.   No facility-administered encounter medications on file as of 03/07/2020.    Allergies (verified) Lisinopril   History: Past Medical History:  Diagnosis Date  . Allergic state 06/26/2015  . Amnesia 2009   isolated;for 30 mins w/elevated BP  . Arthritis   . [place]    skin / top head  . Cataract    bilateral repair  . Cellulitis 2011   LUE (no PMH of MRSA)/left elbow  . Chronic kidney disease    resolved  . Diverticulosis   . Diverticulosis of colon   . GERD (gastroesophageal reflux disease)    OCC HEARTBURN  . Gilbert's syndrome   . Hematuria 2009   Dr PBurnell Blanks . Hyperglycemia   . Hyperlipidemia   . Hypertension   . Lung cancer, lower lobe (  [place])    Stage IIA (T1,N1)  . Lymphoma ([place])    Low grad  . Neuromuscular disorder ([place])    2010 TEMPORARY MEMORY LOSS 1-1.5 HR WAS CHECKED OUT AT [facility] , NOTHING FOUND  . Peptic ulcer 1995  . Peyronie disease   . Preventative health care 12/19/2015  . Primary cancer of left lower lobe of lung ([facility]) 11/27/2011  . Recurrent upper respiratory infection (URI)    09/2011  BRONCHITIS  . SCC (squamous cell carcinoma) 01/21/2009    Qualifier: Diagnosis of  By: Linna Darner MD, Gwyndolyn Saxon   ? Basal Cell     Past Surgical History:  Procedure Laterality Date  . chemo treatment      4 treatments that last 7 hours each/last treatmebt in 2013  . COLONOSCOPY  2015  . COLONOSCOPY W/ POLYPECTOMY  2005   due 2015  . CYSTOSCOPY  2009  . LOBECTOMY  11/02/2011   Procedure: LOBECTOMY;  Surgeon: Melrose Nakayama, MD;  Location: [facility];  Service: Thoracic;  Laterality: Left;  LEFT LOWER LOBECTOMY  . REFRACTIVE SURGERY     Bil  . SKIN BIOPSY     4 0r 5  . Squamous Cell Cancer Excision  12/13/2016  . SUPRACLAVICAL NODE BIOPSY Left 10/15/2014   Procedure: LEFT SUPRACLAVICAL LYMPH NODE BIOPSY;  Surgeon: Melrose Nakayama, MD;  Location: [facility];  Service: Thoracic;  Laterality: Left;  LEFT SUPRACLAVICAL LYMPH NODE BIOPSY  . VASECTOMY    . VEIN SURGERY     R ankle   Family History  Problem Relation Age of Onset  . Stroke Mother        in 106s  . Hypertension Mother   . Breast cancer Mother   . Neuropathy Mother   . Melanoma Father   . Heart attack Father 1       stents  . Dementia Father   . Hypertension Sister   . Thyroid disease Sister        ??  . Multiple myeloma Sister   . Cancer Sister   . Stomach cancer Maternal Grandfather 27  . Lung cancer Maternal Grandfather   . Cancer Paternal Grandmother        lung, nonsmoker  . Headache Daughter   . Diabetes Paternal Aunt   . Colon cancer Neg Hx   . Colon polyps Neg Hx   . Esophageal cancer Neg Hx   . Rectal cancer Neg Hx    Social History   Socioeconomic History  . Marital status: Married    Spouse name: Not on file  . Number of children: Not on file  . Years of education: Not on file  . Highest education level: Not on file  Occupational History  . Occupation: retired  Tobacco Use  . Smoking status: Former Smoker    Packs/day: 1.00    Years: 15.00    Pack years: 15.00    Quit date: 07/31/1979    Years since quitting: 40.6  . Smokeless tobacco: Former Systems developer     Types: Secondary school teacher  . Vaping Use: Never used  Substance and Sexual Activity  . Alcohol use: Yes    Comment: Daily 3 glasses of vodka  . Drug use: No  . Sexual activity: Not Currently    Comment: lives with wife, retired from [employer], ran distribution. No dietary restrictions.  Other Topics Concern  . Not on file  Social History Narrative   Reg exercise   Social Determinants  of Health   Financial Resource Strain: Low Risk   . Difficulty of Paying Living Expenses: Not hard at all  Food Insecurity: No Food Insecurity  . Worried About Charity fundraiser in the Last Year: Never true  . Ran Out of Food in the Last Year: Never true  Transportation Needs: No Transportation Needs  . Lack of Transportation (Medical): No  . Lack of Transportation (Non-Medical): No  Physical Activity:   . Days of Exercise per Week:   . Minutes of Exercise per Session:   Stress:   . Feeling of Stress :   Social Connections:   . Frequency of Communication with Friends and Family:   . Frequency of Social Gatherings with Friends and Family:   . Attends Religious Services:   . Active Member of Clubs or Organizations:   . Attends Archivist Meetings:   [name] Kitchen Marital Status:     Tobacco Counseling Counseling given: Not Answered   Clinical Intake:     Pain : No/denies pain     Activities of Daily Living In your present state of health, do you have any difficulty performing the following activities: 03/07/2020  Hearing? N  Vision? N  Difficulty concentrating or making decisions? N  Walking or climbing stairs? N  Dressing or bathing? N  Doing errands, shopping? N  Preparing Food and eating ? N  Using the Toilet? N  In the past six months, have you accidently leaked urine? N  Do you have problems with loss of bowel control? N  Managing your Medications? N  Managing your Finances? N  Housekeeping or managing your Housekeeping? N  Some recent data might be hidden     Patient Care Team: [name], MD as PCP - General (Family Medicine) Kris Mouton Chauncy Passy, MD as Referring Physician (Nephrology) Curt Bears, MD as Consulting Physician (Oncology) Geri Seminole, MD as Referring Physician (Dermatopathology) Marica Otter, OD as Consulting Physician (Optometry)  Indicate any recent Medical Services you may have received from other than [facility] providers in the past year (date may be approximate).     Assessment:   This is a routine wellness examination for Manu.  Dietary issues and exercise activities discussed: Current Exercise Habits: The patient does not participate in regular exercise at present, Exercise limited by: None identified Diet (meal preparation, eat out, water intake, caffeinated beverages, dairy products, fruits and vegetables): well balanced  Goals    . Eat more fruits and vegetables    . Weight (lb) < 215 lb (97.5 kg)      Depression Screen PHQ 2/9 Scores 03/07/2020 11/18/2017 11/16/2016 11/15/2015 08/05/2014 06/15/2013  PHQ - 2 Score 0 0 0 0 0 0    Fall Risk Fall Risk  03/07/2020 11/18/2017 11/16/2016 11/15/2015 08/05/2014  Falls in the past year? 0 No No No No  Number falls in past yr: 0 - - - -  Injury with Fall? 0 - - - -  Follow up Education provided;Falls prevention discussed - - - -    Any stairs in or around the home? Yes  If so, are there any without handrails? No  Home free of loose throw rugs in walkways, pet beds, electrical cords, etc? Yes  Adequate lighting in your home to reduce risk of falls? Yes   ASSISTIVE DEVICES UTILIZED TO PREVENT FALLS: none needed per pt    Cognitive Function: Ad8 score reviewed for issues:  Issues making decisions:no  Less interest in hobbies / activities:no  Repeats  questions, stories (family complaining):no  Trouble using ordinary gadgets (microwave, computer, phone):no  Forgets the month or year: no  Mismanaging finances: no  Remembering appts:no  Daily problems  with thinking and/or memory:no Ad8 score is=0     MMSE - Mini Mental State Exam 11/18/2017 11/16/2016 11/15/2015  Orientation to time 5 5 5   Orientation to Place 5 5 5   Registration 3 3 3   Attention/ Calculation 5 5 5   Recall 3 3 3   Language- name 2 objects 2 2 2   Language- repeat 1 1 1   Language- follow 3 step command 3 3 3   Language- read & follow direction 1 1 1   Write a sentence 1 1 1   Copy design 1 1 1   Total score 30 30 30         Immunizations Immunization History  Administered Date(s) Administered  . Fluad Quad(high Dose 65+) 04/21/2019  . Influenza, High Dose Seasonal PF 06/06/2016, 06/07/2017, 05/19/2018  . Influenza,inj,Quad PF,6+ Mos 06/17/2015  . PFIZER SARS-COV-2 Vaccination 08/11/2019, 08/31/2019  . Pneumococcal Conjugate-13 12/13/2014  . Pneumococcal Polysaccharide-23 02/01/2010  . Td 09/27/2001  . Tdap 09/04/2012    TDAP status: Up to date Flu Vaccine status: Up to date Pneumococcal vaccine status: Up to date Covid-19 vaccine status: Completed vaccines  Screening Tests Health Maintenance  Topic Date Due  . INFLUENZA VACCINE  02/28/2020  . TETANUS/TDAP  09/04/2022  . COLONOSCOPY  01/05/2025  . COVID-19 Vaccine  Completed  . Hepatitis C Screening  Completed  . PNA vac Low Risk Adult  Completed    Health Maintenance  Health Maintenance Due  Topic Date Due  . INFLUENZA VACCINE  02/28/2020    Colorectal cancer screening: No longer required.    Additional Screening:  Hepatitis C Screening: does qualify; Completed 12/18/16   Vision Screening: Recommended annual ophthalmology exams for early detection of glaucoma and other disorders of the eye. Is the patient up to date with their annual eye exam?  Yes  Who is the provider or what is the name of the office in which the patient attends annual eye exams? Dr.Miller   Dental Screening: Recommended annual dental exams for proper oral hygiene  Community Resource Referral / Chronic Care  Management: CRR required this visit?  No   CCM required this visit?  No      Plan:  Please schedule your next medicare wellness visit with me in 1 yr.  Continue to eat heart healthy diet (full of fruits, vegetables, whole grains, lean protein, water--limit salt, fat, and sugar intake) and increase physical activity as tolerated.  Continue doing brain stimulating activities (puzzles, reading, adult coloring books, staying active) to keep memory sharp.     I have personally reviewed and noted the following in the patient's chart:   . Medical and social history . Use of alcohol, tobacco or illicit drugs  . Current medications and supplements . Functional ability and status . Nutritional status . Physical activity . Advanced directives . List of other physicians . Hospitalizations, surgeries, and ER visits in previous 12 months . Vitals . Screenings to include cognitive, depression, and falls . Referrals and appointments  In addition, I have reviewed and discussed with patient certain preventive protocols, quality metrics, and best practice recommendations. A written personalized care plan for preventive services as well as general preventive health recommendations were provided to patient.   Due to this being a telephonic visit, the after visit summary with patients personalized plan was offered to patient via mail or  my-chart.  Patient would like to access on my-chart.  Shela Nevin, [place]   03/07/2020

## 2020-03-07 ENCOUNTER — Encounter: Payer: Self-pay | Admitting: *Deleted

## 2020-03-07 ENCOUNTER — Other Ambulatory Visit: Payer: Self-pay

## 2020-03-07 ENCOUNTER — Ambulatory Visit (INDEPENDENT_AMBULATORY_CARE_PROVIDER_SITE_OTHER): Payer: Medicare HMO | Admitting: *Deleted

## 2020-03-07 VITALS — BP 130/72 | HR 80 | Wt 233.0 lb

## 2020-03-07 DIAGNOSIS — Z0000 Encounter for general adult medical examination without abnormal findings: Secondary | ICD-10-CM | POA: Diagnosis not present

## 2020-03-07 NOTE — Patient Instructions (Signed)
Please schedule your next medicare wellness visit with me in 1 yr.  Continue to eat heart healthy diet (full of fruits, vegetables, whole grains, lean protein, water--limit salt, fat, and sugar intake) and increase physical activity as tolerated.  Continue doing brain stimulating activities (puzzles, reading, adult coloring books, staying active) to keep memory sharp.    Kyle Long , Thank you for taking time to come for your Medicare Wellness Visit. I appreciate your ongoing commitment to your health goals. Please review the following plan we discussed and let me know if I can assist you in the future.   These are the goals we discussed: Goals     Eat more fruits and vegetables     Weight (lb) < 215 lb (97.5 kg)       This is a list of the screening recommended for you and due dates:  Health Maintenance  Topic Date Due   Flu Shot  02/28/2020   Tetanus Vaccine  09/04/2022   Colon Cancer Screening  01/05/2025   COVID-19 Vaccine  Completed    Hepatitis C: One time screening is recommended by Center for Disease Control  (CDC) for  adults born from [birth year] through 1967.   Completed   Pneumonia vaccines  Completed    Preventive Care 70 Years and Older, Male Preventive care refers to lifestyle choices and visits with your health care provider that can promote health and wellness. This includes:  A yearly physical exam. This is also called an annual well check.  Regular dental and eye exams.  Immunizations.  Screening for certain conditions.  Healthy lifestyle choices, such as diet and exercise. What can I expect for my preventive care visit? Physical exam Your health care provider will check:  Height and weight. These may be used to calculate body mass index (BMI), which is a measurement that tells if you are at a healthy weight.  Heart rate and blood pressure.  Your skin for abnormal spots. Counseling Your health care provider may ask you questions  about:  Alcohol, tobacco, and drug use.  Emotional well-being.  Home and relationship well-being.  Sexual activity.  Eating habits.  History of falls.  Memory and ability to understand (cognition).  Work and work Statistician. What immunizations do I need?  Influenza (flu) vaccine  This is recommended every year. Tetanus, diphtheria, and pertussis (Tdap) vaccine  You may need a Td booster every 10 years. Varicella (chickenpox) vaccine  You may need this vaccine if you have not already been vaccinated. Zoster (shingles) vaccine  You may need this after age 75. Pneumococcal conjugate (PCV13) vaccine  One dose is recommended after age 76. Pneumococcal polysaccharide (PPSV23) vaccine  One dose is recommended after age 33. Measles, mumps, and rubella (MMR) vaccine  You may need at least one dose of MMR if you were born in 1957 or later. You may also need a second dose. Meningococcal conjugate (MenACWY) vaccine  You may need this if you have certain conditions. Hepatitis A vaccine  You may need this if you have certain conditions or if you travel or work in places where you may be exposed to hepatitis A. Hepatitis B vaccine  You may need this if you have certain conditions or if you travel or work in places where you may be exposed to hepatitis B. Haemophilus influenzae type b (Hib) vaccine  You may need this if you have certain conditions. You may receive vaccines as individual doses or as more than one vaccine together  in one shot (combination vaccines). Talk with your health care provider about the risks and benefits of combination vaccines. What tests do I need? Blood tests  Lipid and cholesterol levels. These may be checked every 5 years, or more frequently depending on your overall health.  Hepatitis C test.  Hepatitis B test. Screening  Lung cancer screening. You may have this screening every year starting at age 35 if you have a 30-pack-year history of  smoking and currently smoke or have quit within the past 15 years.  Colorectal cancer screening. All adults should have this screening starting at age 68 and continuing until age 79. Your health care provider may recommend screening at age 35 if you are at increased risk. You will have tests every 1-10 years, depending on your results and the type of screening test.  Prostate cancer screening. Recommendations will vary depending on your family history and other risks.  Diabetes screening. This is done by checking your blood sugar (glucose) after you have not eaten for a while (fasting). You may have this done every 1-3 years.  Abdominal aortic aneurysm (AAA) screening. You may need this if you are a current or former smoker.  Sexually transmitted disease (STD) testing. Follow these instructions at home: Eating and drinking  Eat a diet that includes fresh fruits and vegetables, whole grains, lean protein, and low-fat dairy products. Limit your intake of foods with high amounts of sugar, saturated fats, and salt.  Take vitamin and mineral supplements as recommended by your health care provider.  Do not drink alcohol if your health care provider tells you not to drink.  If you drink alcohol: ? Limit how much you have to 0-2 drinks a day. ? Be aware of how much alcohol is in your drink. In the [place] one drink equals one 12 oz bottle of beer (355 mL), one 5 oz glass of wine (148 mL), or one 1 oz glass of hard liquor (44 mL). Lifestyle  Take daily care of your teeth and gums.  Stay active. Exercise for at least 30 minutes on 5 or more days each week.  Do not use any products that contain nicotine or tobacco, such as cigarettes, e-cigarettes, and chewing tobacco. If you need help quitting, ask your health care provider.  If you are sexually active, practice safe sex. Use a condom or other form of protection to prevent STIs (sexually transmitted infections).  Talk with your health care  provider about taking a low-dose aspirin or statin. What's next?  Visit your health care provider once a year for a well check visit.  Ask your health care provider how often you should have your eyes and teeth checked.  Stay up to date on all vaccines. This information is not intended to replace advice given to you by your health care provider. Make sure you discuss any questions you have with your health care provider. Document Revised: 07/10/2018 Document Reviewed: 07/10/2018 Elsevier Patient Education  2020 [employer].

## 2020-03-20 ENCOUNTER — Other Ambulatory Visit: Payer: Self-pay | Admitting: Family Medicine

## 2020-04-15 ENCOUNTER — Other Ambulatory Visit: Payer: Self-pay | Admitting: Family Medicine

## 2020-05-03 ENCOUNTER — Other Ambulatory Visit: Payer: Self-pay | Admitting: Family Medicine

## 2020-05-12 ENCOUNTER — Other Ambulatory Visit: Payer: Self-pay | Admitting: Family Medicine

## 2020-05-13 ENCOUNTER — Other Ambulatory Visit: Payer: Self-pay

## 2020-05-13 ENCOUNTER — Ambulatory Visit (INDEPENDENT_AMBULATORY_CARE_PROVIDER_SITE_OTHER): Payer: Medicare HMO

## 2020-05-13 ENCOUNTER — Ambulatory Visit: Payer: Medicare HMO | Attending: Internal Medicine

## 2020-05-13 ENCOUNTER — Other Ambulatory Visit (HOSPITAL_BASED_OUTPATIENT_CLINIC_OR_DEPARTMENT_OTHER): Payer: Self-pay | Admitting: Internal Medicine

## 2020-05-13 DIAGNOSIS — Z23 Encounter for immunization: Secondary | ICD-10-CM

## 2020-05-13 NOTE — Progress Notes (Signed)
   Covid-19 Vaccination Clinic  Name:  MERLYN CONLEY    MRN: [id number] DOB: 1945-08-12  05/13/2020  Mr. Sans was observed post Covid-19 immunization for 15 minutes without incident. He was provided with Vaccine Information Sheet and instruction to access the V-Safe system. Vaccinated by Tanja Port.  Mr. Bremer was instructed to call 911 with any severe reactions post vaccine: [name] Kitchen Difficulty breathing  . Swelling of face and throat  . A fast heartbeat  . A bad rash all over body  . Dizziness and weakness

## 2020-05-13 NOTE — Progress Notes (Signed)
Pre visit review using our clinic review tool, if applicable. No additional management support is needed unless otherwise documented below in the visit note.  Patient here for flu vaccine. 0.5mL flu vaccine given in left deltoid IM. Patient tolerated well. VIS given.   

## 2020-05-23 MED FILL — PFIZER-BIONTECH COVID-19 VA: 30 | 1 days supply | Qty: 0 | Fill #0

## 2020-06-02 ENCOUNTER — Emergency Department
Admission: EM | Admit: 2020-06-02 | Discharge: 2020-06-02 | Disposition: A | Discharge status: Home / Self Care | Payer: Medicare HMO | Source: Home / Self Care

## 2020-06-02 ENCOUNTER — Other Ambulatory Visit: Payer: Self-pay

## 2020-06-02 ENCOUNTER — Emergency Department (INDEPENDENT_AMBULATORY_CARE_PROVIDER_SITE_OTHER): Payer: Medicare HMO

## 2020-06-02 ENCOUNTER — Encounter: Payer: Self-pay | Admitting: Emergency Medicine

## 2020-06-02 DIAGNOSIS — R059 Cough, unspecified: Secondary | ICD-10-CM

## 2020-06-02 DIAGNOSIS — J069 Acute upper respiratory infection, unspecified: Secondary | ICD-10-CM

## 2020-06-02 DIAGNOSIS — J90 Pleural effusion, not elsewhere classified: Secondary | ICD-10-CM | POA: Diagnosis not present

## 2020-06-02 MED ORDER — PROMETHAZINE-CODEINE 6.25-10 MG/5ML PO SOLN: 5.0000 mL | Freq: Two times a day (BID) | ORAL | 0 refills | Status: DC | PRN

## 2020-06-02 MED ORDER — DOXYCYCLINE HYCLATE 100 MG PO CAPS
100.0000 mg | ORAL_CAPSULE | Freq: Two times a day (BID) | ORAL | 0 refills | Status: DC
Start: 2020-06-02 — End: 2020-08-04

## 2020-06-02 NOTE — ED Triage Notes (Signed)
Productive cough & congestion x 1 1/2 weeks  Min relief w/ Mucinex DM Prescribed cough medicine w/ phenergan helped, but pt does not have any left  COVID booster 05/13/20

## 2020-06-02 NOTE — ED Provider Notes (Signed)
[facility]    CSN: [id number] Arrival date & time: 06/02/20  1143      History   Chief Complaint Chief Complaint  Patient presents with  . Cough    HPI Kyle Long is a 74 y.o. male.   HPI  Kyle Long is a 74 y.o. male presenting to UC with c/o productive cough and congestion for 1.5 weeks.  He has mild relief with OTC muccinex DM but states he had some leftover cough medicine, phenergan with codeine, which helps at night. Pt requesting a refill. Denies fever, chills, n/v/d. Hx of Left lower lobe lung cancer and lobectomy. He f/u with oncology yearly. Last CT was in July 2021. He has a known small nodule in Right upper lung, oncologist is monitoring it.   Pt notes he had his COVID booster 05/13/20.    Past Medical History:  Diagnosis Date  . Allergic state 06/26/2015  . Amnesia 2009   isolated;for 30 mins w/elevated BP  . Arthritis   . Cancer ([place])    skin / top head  . Cataract    bilateral repair  . Cellulitis 2011   LUE (no PMH of MRSA)/left elbow  . Chronic kidney disease    resolved  . Diverticulosis   . Diverticulosis of colon   . GERD (gastroesophageal reflux disease)    OCC HEARTBURN  . Gilbert's syndrome   . Hematuria 2009   Dr Burnell Blanks  . Hyperglycemia   . Hyperlipidemia   . Hypertension   . Lung cancer, lower lobe (HCC)    Stage IIA (T1,N1)  . Lymphoma ([place])    Low grad  . Neuromuscular disorder ([place])    2010 TEMPORARY MEMORY LOSS 1-1.5 HR WAS CHECKED OUT AT [facility] , NOTHING FOUND  . Peptic ulcer 1995  . Peyronie disease   . Preventative health care 12/19/2015  . Primary cancer of left lower lobe of lung ([place]) 11/27/2011  . Recurrent upper respiratory infection (URI)    09/2011  BRONCHITIS  . SCC (squamous cell carcinoma) 01/21/2009   Qualifier: Diagnosis of  By: Linna Darner MD, Gwyndolyn Saxon   ? Basal Cell      Patient Active Problem List   Diagnosis Date Noted  . Nocturia 05/24/2019  . Psoriasis 05/19/2018  . Cataract  05/19/2018  . Preventative health care 12/19/2015  . Allergic state 06/26/2015  . Obesity 01/25/2015  . Dyspnea 12/13/2014  . Cough 11/11/2014  . Malignant lymphoma, small lymphocytic ([place]) 11/09/2014  . Tachycardia 09/27/2014  . Hyperglycemia 08/12/2014  . Nonspecific elevation of levels of transaminase or lactic acid dehydrogenase (LDH) 09/12/2013  . CKD (chronic kidney disease), stage III ([place]) 12/08/2012  . Lung cancer ([place]) 12/08/2012  . Hypertension 12/08/2012  . Renal insufficiency 09/04/2012  . Primary cancer of left lower lobe of lung ([place]) 11/27/2011  . S/P thoracotomy 11/05/2011  . Benign prostatic hyperplasia 02/01/2010  . DIVERTICULOSIS, COLON 01/21/2009  . SCC (squamous cell carcinoma) 01/21/2009  . COLONIC POLYPS, HX OF 01/21/2009  . Hyperlipidemia, mixed 01/28/2008  . Disorder of bilirubin excretion 01/28/2008  . Essential hypertension 01/28/2008  . Peyronie disease 01/28/2008    Past Surgical History:  Procedure Laterality Date  . chemo treatment      4 treatments that last 7 hours each/last treatmebt in 2013  . COLONOSCOPY  2015  . COLONOSCOPY W/ POLYPECTOMY  2005   due 2015  . CYSTOSCOPY  2009  . LOBECTOMY  11/02/2011   Procedure: LOBECTOMY;  Surgeon: Remo Lipps  Chaya Jan, MD;  Location: [facility];  Service: Thoracic;  Laterality: Left;  LEFT LOWER LOBECTOMY  . REFRACTIVE SURGERY     Bil  . SKIN BIOPSY     4 0r 5  . Squamous Cell Cancer Excision  12/13/2016  . SUPRACLAVICAL NODE BIOPSY Left 10/15/2014   Procedure: LEFT SUPRACLAVICAL LYMPH NODE BIOPSY;  Surgeon: Melrose Nakayama, MD;  Location: [facility];  Service: Thoracic;  Laterality: Left;  LEFT SUPRACLAVICAL LYMPH NODE BIOPSY  . VASECTOMY    . VEIN SURGERY     R ankle       Home Medications    Prior to Admission medications   Medication Sig Start Date End Date Taking? Authorizing Provider  Ascorbic Acid (VITAMIN C PO) Take by mouth.   Yes [provider]  chlorthalidone (HYGROTON) 25 MG  tablet TAKE 1 TABLET DAILY 04/15/20  Yes [name], MD  diltiazem (CARDIZEM CD) 240 MG 24 hr capsule TAKE 1 CAPSULE DAILY 04/15/20  Yes [name], MD  losartan (COZAAR) 100 MG tablet TAKE 1/2 TABLET DAILY 05/03/20  Yes [name], MD  metoprolol tartrate (LOPRESSOR) 50 MG tablet TAKE 1/2 TABLET TWICE A DAY 05/13/20  Yes [name], MD  VITAMIN D PO Take by mouth.   Yes [provider]  doxycycline (VIBRAMYCIN) 100 MG capsule Take 1 capsule (100 mg total) by mouth 2 (two) times daily. 06/02/20   Noe Gens, PA-C  Promethazine-Codeine 6.25-10 MG/5ML SOLN Take 5 mLs by mouth 2 (two) times daily as needed. 06/02/20   Noe Gens, PA-C    Family History Family History  Problem Relation Age of Onset  . Stroke Mother        in 36s  . Hypertension Mother   . Breast cancer Mother   . Neuropathy Mother   . Melanoma Father   . Heart attack Father 72       stents  . Dementia Father   . Hypertension Sister   . Thyroid disease Sister        ??  . Multiple myeloma Sister   . Cancer Sister   . Stomach cancer Maternal Grandfather 92  . Lung cancer Maternal Grandfather   . Cancer Paternal Grandmother        lung, nonsmoker  . Headache Daughter   . Diabetes Paternal Aunt   . Colon cancer Neg Hx   . Colon polyps Neg Hx   . Esophageal cancer Neg Hx   . Rectal cancer Neg Hx     Social History Social History   Tobacco Use  . Smoking status: Former Smoker    Packs/day: 1.00    Years: 15.00    Pack years: 15.00    Quit date: 07/31/1979    Years since quitting: 40.8  . Smokeless tobacco: Former Systems developer    Types: Secondary school teacher  . Vaping Use: Never used  Substance Use Topics  . Alcohol use: Yes    Comment: Daily 3 glasses of vodka  . Drug use: No     Allergies   Lisinopril   Review of Systems Review of Systems  Constitutional: Negative for chills and fever.  HENT: Positive for congestion. Negative for ear pain, sore throat, trouble swallowing and  voice change.   Respiratory: Positive for cough. Negative for shortness of breath.   Cardiovascular: Negative for chest pain and palpitations.  Gastrointestinal: Negative for abdominal pain, diarrhea, nausea and vomiting.  Musculoskeletal: Negative for arthralgias, back  pain and myalgias.  Skin: Negative for rash.  All other systems reviewed and are negative.    Physical Exam Triage Vital Signs ED Triage Vitals  Enc Vitals Group     BP 06/02/20 1201 (!) 143/89     Pulse Rate 06/02/20 1201 78     Resp 06/02/20 1201 17     Temp 06/02/20 1201 99.4 F (37.4 C)     Temp Source 06/02/20 1201 Oral     SpO2 06/02/20 1201 99 %     Weight 06/02/20 1204 225 lb (102.1 kg)     Height 06/02/20 1204 5' 10"  (1.778 m)     Head Circumference --      Peak Flow --      Pain Score 06/02/20 1204 0     Pain Loc --      Pain Edu? --      Excl. in [place]? --    No data found.  Updated Vital Signs BP (!) 143/89 (BP Location: Right Arm)   Pulse 78   Temp 99.4 F (37.4 C) (Oral)   Resp 17   Ht 5' 10"  (1.778 m)   Wt 225 lb (102.1 kg)   SpO2 99%   BMI 32.28 kg/m   Visual Acuity Right Eye Distance:   Left Eye Distance:   Bilateral Distance:    Right Eye Near:   Left Eye Near:    Bilateral Near:     Physical Exam Vitals and nursing note reviewed.  Constitutional:      General: He is not in acute distress.    Appearance: Normal appearance. He is well-developed. He is not ill-appearing, toxic-appearing or diaphoretic.  HENT:     Head: Normocephalic and atraumatic.     Right Ear: Tympanic membrane and ear canal normal.     Left Ear: Tympanic membrane and ear canal normal.     Nose: Nose normal.     Mouth/Throat:     Mouth: Mucous membranes are moist.     Pharynx: Oropharynx is clear.  Cardiovascular:     Rate and Rhythm: Normal rate and regular rhythm.  Pulmonary:     Effort: Pulmonary effort is normal. No respiratory distress.     Breath sounds: No wheezing.     Comments: Absent left  lower lung sounds (lobectomy) lungs otherwise CTAB Musculoskeletal:        General: Normal range of motion.     Cervical back: Normal range of motion and neck supple.  Lymphadenopathy:     Cervical: No cervical adenopathy.  Skin:    General: Skin is warm and dry.  Neurological:     Mental Status: He is alert and oriented to person, place, and time.  Psychiatric:        Behavior: Behavior normal.      UC Treatments / Results  Labs (all labs ordered are listed, but only abnormal results are displayed) Labs Reviewed - No data to display  EKG   Radiology DG Chest 2 View  Result Date: 06/02/2020 CLINICAL DATA:  Cough. EXAM: CHEST - 2 VIEW COMPARISON:  CT 02/22/2020.  Chest x-ray 08/12/2018. FINDINGS: Surgical clips right chest. Heart size normal. Postsurgical changes left lung again noted. Mild left base pleural-parenchymal thickening suggesting scarring. Tiny left effusion cannot be excluded. No interim change from prior exam. Previously described right upper lung small ill-defined density may remain would be best evaluated by CT. Heart size normal. Degenerative changes scoliosis thoracic spine. IMPRESSION: Number postsurgical changes left lung again  noted. Mild left base pleural-parenchymal thickening suggesting scarring. Tiny left pleural effusion cannot be excluded. No interim change from prior exam. 2. Previously identified small ill-defined right upper lobe pulmonary density may remain. Given its small size it would best be evaluated by CT. Reference is made to prior CT report 02/22/2020. Electronically Signed   By: Marcello Moores  Register   On: 06/02/2020 12:34    Procedures Procedures (including critical care time)  Medications Ordered in UC Medications - No data to display  Initial Impression / Assessment and Plan / UC Course  I have reviewed the triage vital signs and the nursing notes.  Pertinent labs & imaging results that were available during my care of the patient were  reviewed by me and considered in my medical decision making (see chart for details).     Discussed imaging with pt Due to hx and duration of cough and tiny left pleural effusion, will cover for bacterial infection with doxycycline Encouraged f/u with PCP and oncology  AVS given  Final Clinical Impressions(s) / UC Diagnoses   Final diagnoses:  Upper respiratory tract infection, unspecified type     Discharge Instructions      Promethazine-codeine is a strong cough medication to help you limit your coughing at night to help you rest. It can cause drowsiness and dizziness. It can cause trouble breathing and accidental overdose if taking more than prescribed or if taken with other medications that can cause drowsiness. Do not take with Benadryl, Nyquil, Tylenol PM or other medications that can cause drowsiness. Do not drive or operate heavy machinery while taking.   Call to schedule a follow up appointment with your primary care provider next week if not improving. She may want to repeat your chest x-ray or order a CT scan to check on the fluid in your lungs and small nodule on right upper lung.   Call 911 or have someone drive you to the hospital if symptoms significantly worsening including chest pain, trouble breathing, dizziness/passing out or other new concerning symptoms develop.       ED Prescriptions    Medication Sig Dispense Auth. Provider   doxycycline (VIBRAMYCIN) 100 MG capsule Take 1 capsule (100 mg total) by mouth 2 (two) times daily. 20 capsule Leeroy Cha O, PA-C   Promethazine-Codeine 6.25-10 MG/5ML SOLN Take 5 mLs by mouth 2 (two) times daily as needed. 80 mL Noe Gens, [place]     I have reviewed the PDMP during this encounter.   Noe Gens, PA-C 06/02/20 1505

## 2020-06-02 NOTE — Discharge Instructions (Signed)
  Promethazine-codeine is a strong cough medication to help you limit your coughing at night to help you rest. It can cause drowsiness and dizziness. It can cause trouble breathing and accidental overdose if taking more than prescribed or if taken with other medications that can cause drowsiness. Do not take with Benadryl, Nyquil, Tylenol PM or other medications that can cause drowsiness. Do not drive or operate heavy machinery while taking.   Call to schedule a follow up appointment with your primary care provider next week if not improving. She may want to repeat your chest x-ray or order a CT scan to check on the fluid in your lungs and small nodule on right upper lung.   Call 911 or have someone drive you to the hospital if symptoms significantly worsening including chest pain, trouble breathing, dizziness/passing out or other new concerning symptoms develop.

## 2020-07-11 ENCOUNTER — Other Ambulatory Visit: Payer: Self-pay | Admitting: Family Medicine

## 2020-07-12 DIAGNOSIS — L579 Skin changes due to chronic exposure to nonionizing radiation, unspecified: Secondary | ICD-10-CM | POA: Diagnosis not present

## 2020-07-12 DIAGNOSIS — L400 Psoriasis vulgaris: Secondary | ICD-10-CM | POA: Diagnosis not present

## 2020-07-12 DIAGNOSIS — L814 Other melanin hyperpigmentation: Secondary | ICD-10-CM | POA: Diagnosis not present

## 2020-07-12 DIAGNOSIS — L570 Actinic keratosis: Secondary | ICD-10-CM | POA: Diagnosis not present

## 2020-07-12 DIAGNOSIS — D225 Melanocytic nevi of trunk: Secondary | ICD-10-CM | POA: Diagnosis not present

## 2020-07-12 DIAGNOSIS — L821 Other seborrheic keratosis: Secondary | ICD-10-CM | POA: Diagnosis not present

## 2020-07-12 DIAGNOSIS — L820 Inflamed seborrheic keratosis: Secondary | ICD-10-CM | POA: Diagnosis not present

## 2020-07-12 DIAGNOSIS — Z85828 Personal history of other malignant neoplasm of skin: Secondary | ICD-10-CM | POA: Diagnosis not present

## 2020-08-03 ENCOUNTER — Other Ambulatory Visit: Payer: Self-pay

## 2020-08-04 ENCOUNTER — Ambulatory Visit (INDEPENDENT_AMBULATORY_CARE_PROVIDER_SITE_OTHER): Payer: Medicare HMO | Admitting: Family Medicine

## 2020-08-04 ENCOUNTER — Other Ambulatory Visit: Payer: Self-pay

## 2020-08-04 ENCOUNTER — Encounter: Payer: Self-pay | Admitting: Family Medicine

## 2020-08-04 VITALS — BP 122/74 | HR 82 | Temp 98.1°F | Resp 16 | Ht 71.0 in | Wt 233.4 lb

## 2020-08-04 DIAGNOSIS — Z860100 Personal history of colon polyps, unspecified: Secondary | ICD-10-CM

## 2020-08-04 DIAGNOSIS — Z0001 Encounter for general adult medical examination with abnormal findings: Secondary | ICD-10-CM | POA: Diagnosis not present

## 2020-08-04 DIAGNOSIS — L409 Psoriasis, unspecified: Secondary | ICD-10-CM | POA: Diagnosis not present

## 2020-08-04 DIAGNOSIS — E782 Mixed hyperlipidemia: Secondary | ICD-10-CM | POA: Diagnosis not present

## 2020-08-04 DIAGNOSIS — N189 Chronic kidney disease, unspecified: Secondary | ICD-10-CM | POA: Diagnosis not present

## 2020-08-04 DIAGNOSIS — R739 Hyperglycemia, unspecified: Secondary | ICD-10-CM | POA: Diagnosis not present

## 2020-08-04 DIAGNOSIS — E6609 Other obesity due to excess calories: Secondary | ICD-10-CM

## 2020-08-04 DIAGNOSIS — R350 Frequency of micturition: Secondary | ICD-10-CM | POA: Diagnosis not present

## 2020-08-04 DIAGNOSIS — Z0000 Encounter for general adult medical examination without abnormal findings: Secondary | ICD-10-CM

## 2020-08-04 DIAGNOSIS — I10 Essential (primary) hypertension: Secondary | ICD-10-CM

## 2020-08-04 LAB — COMPREHENSIVE METABOLIC PANEL
ALT: 38 U/L (ref 0–53)
AST: 34 U/L (ref 0–37)
Albumin: 4.7 g/dL (ref 3.5–5.2)
Alkaline Phosphatase: 46 U/L (ref 39–117)
BUN: 25 mg/dL — ABNORMAL HIGH (ref 6–23)
CO2: 32 mEq/L (ref 19–32)
Calcium: 9.9 mg/dL (ref 8.4–10.5)
Chloride: 95 mEq/L — ABNORMAL LOW (ref 96–112)
Creatinine, Ser: 1.51 mg/dL — ABNORMAL HIGH (ref 0.40–1.50)
GFR: 44.65 mL/min — ABNORMAL LOW (ref >60.00)
Glucose, Bld: 123 mg/dL — ABNORMAL HIGH (ref 70–99)
Potassium: 3.9 mEq/L (ref 3.5–5.1)
Sodium: 135 mEq/L (ref 135–145)
Total Bilirubin: 1.1 mg/dL (ref 0.2–1.2)
Total Protein: 7.3 g/dL (ref 6.0–8.3)

## 2020-08-04 LAB — CBC
HCT: 43.5 % (ref 39.0–52.0)
Hemoglobin: 15.1 g/dL (ref 13.0–17.0)
MCHC: 34.8 g/dL (ref 30.0–36.0)
MCV: 95.7 fl (ref 78.0–100.0)
Platelets: 233 10*3/uL (ref 150.0–400.0)
RBC: 4.54 Mil/uL (ref 4.22–5.81)
RDW: 12.8 % (ref 11.5–15.5)
WBC: 10 10*3/uL (ref 4.0–10.5)

## 2020-08-04 LAB — LIPID PANEL
Cholesterol: 208 mg/dL — ABNORMAL HIGH (ref 0–200)
HDL: 46.8 mg/dL (ref >39.00)
Total CHOL/HDL Ratio: 4
Triglycerides: 420 mg/dL — ABNORMAL HIGH (ref 0.0–149.0)

## 2020-08-04 LAB — HEMOGLOBIN A1C: Hgb A1c MFr Bld: 5.9 % (ref 4.6–6.5)

## 2020-08-04 LAB — TSH: TSH: 2.92 u[IU]/mL (ref 0.35–4.50)

## 2020-08-04 LAB — LDL CHOLESTEROL, DIRECT: Direct LDL: 99 mg/dL

## 2020-08-04 MED ORDER — METOPROLOL TARTRATE 25 MG PO TABS
25.0000 mg | ORAL_TABLET | Freq: Two times a day (BID) | ORAL | 1 refills | Status: DC
Start: 2020-08-04 — End: 2021-02-02

## 2020-08-04 NOTE — Assessment & Plan Note (Signed)
Well controlled, no changes to meds. Encouraged heart healthy diet such as the DASH diet and exercise as tolerated.  °

## 2020-08-04 NOTE — Assessment & Plan Note (Signed)
Right elbow and right abdomen only patches with new steroid cream from dermatology

## 2020-08-04 NOTE — Assessment & Plan Note (Addendum)
Patient encouraged to maintain heart healthy diet, regular exercise, adequate sleep. Consider daily probiotics. Take medications as prescribed. Labs ordered and reviewed 

## 2020-08-04 NOTE — Patient Instructions (Addendum)
Shingrix is the new shingles shot, 2 shots over 2-6 months at the pharmacy  [facility] can help with any urinary concerns. Preventive Care 36 Years and Older, Male Preventive care refers to lifestyle choices and visits with your health care provider that can promote health and wellness. This includes:  A yearly physical exam. This is also called an annual well check.  Regular dental and eye exams.  Immunizations.  Screening for certain conditions.  Healthy lifestyle choices, such as diet and exercise. What can I expect for my preventive care visit? Physical exam Your health care provider will check:  Height and weight. These may be used to calculate body mass index (BMI), which is a measurement that tells if you are at a healthy weight.  Heart rate and blood pressure.  Your skin for abnormal spots. Counseling Your health care provider may ask you questions about:  Alcohol, tobacco, and drug use.  Emotional well-being.  Home and relationship well-being.  Sexual activity.  Eating habits.  History of falls.  Memory and ability to understand (cognition).  Work and work Statistician. What immunizations do I need?  Influenza (flu) vaccine  This is recommended every year. Tetanus, diphtheria, and pertussis (Tdap) vaccine  You may need a Td booster every 10 years. Varicella (chickenpox) vaccine  You may need this vaccine if you have not already been vaccinated. Zoster (shingles) vaccine  You may need this after age 44. Pneumococcal conjugate (PCV13) vaccine  One dose is recommended after age 73. Pneumococcal polysaccharide (PPSV23) vaccine  One dose is recommended after age 63. Measles, mumps, and rubella (MMR) vaccine  You may need at least one dose of MMR if you were born in 1957 or later. You may also need a second dose. Meningococcal conjugate (MenACWY) vaccine  You may need this if you have certain conditions. Hepatitis A vaccine  You may need  this if you have certain conditions or if you travel or work in places where you may be exposed to hepatitis A. Hepatitis B vaccine  You may need this if you have certain conditions or if you travel or work in places where you may be exposed to hepatitis B. Haemophilus influenzae type b (Hib) vaccine  You may need this if you have certain conditions. You may receive vaccines as individual doses or as more than one vaccine together in one shot (combination vaccines). Talk with your health care provider about the risks and benefits of combination vaccines. What tests do I need? Blood tests  Lipid and cholesterol levels. These may be checked every 5 years, or more frequently depending on your overall health.  Hepatitis C test.  Hepatitis B test. Screening  Lung cancer screening. You may have this screening every year starting at age 20 if you have a 30-pack-year history of smoking and currently smoke or have quit within the past 15 years.  Colorectal cancer screening. All adults should have this screening starting at age 47 and continuing until age 22. Your health care provider may recommend screening at age 37 if you are at increased risk. You will have tests every 1-10 years, depending on your results and the type of screening test.  Prostate cancer screening. Recommendations will vary depending on your family history and other risks.  Diabetes screening. This is done by checking your blood sugar (glucose) after you have not eaten for a while (fasting). You may have this done every 1-3 years.  Abdominal aortic aneurysm (AAA) screening. You may need this if  you are a current or former smoker.  Sexually transmitted disease (STD) testing. Follow these instructions at home: Eating and drinking  Eat a diet that includes fresh fruits and vegetables, whole grains, lean protein, and low-fat dairy products. Limit your intake of foods with high amounts of sugar, saturated fats, and salt.  Take  vitamin and mineral supplements as recommended by your health care provider.  Do not drink alcohol if your health care provider tells you not to drink.  If you drink alcohol: ? Limit how much you have to 0-2 drinks a day. ? Be aware of how much alcohol is in your drink. In the [place] one drink equals one 12 oz bottle of beer (355 mL), one 5 oz glass of wine (148 mL), or one 1 oz glass of hard liquor (44 mL). Lifestyle  Take daily care of your teeth and gums.  Stay active. Exercise for at least 30 minutes on 5 or more days each week.  Do not use any products that contain nicotine or tobacco, such as cigarettes, e-cigarettes, and chewing tobacco. If you need help quitting, ask your health care provider.  If you are sexually active, practice safe sex. Use a condom or other form of protection to prevent STIs (sexually transmitted infections).  Talk with your health care provider about taking a low-dose aspirin or statin. What's next?  Visit your health care provider once a year for a well check visit.  Ask your health care provider how often you should have your eyes and teeth checked.  Stay up to date on all vaccines. This information is not intended to replace advice given to you by your health care provider. Make sure you discuss any questions you have with your health care provider. Document Revised: 07/10/2018 Document Reviewed: 07/10/2018 Elsevier Patient Education  2020 [employer].

## 2020-08-07 NOTE — Assessment & Plan Note (Signed)
hgba1c acceptable, minimize simple carbs. Increase exercise as tolerated.  

## 2020-08-07 NOTE — Assessment & Plan Note (Signed)
Encouraged heart healthy diet such as MIND diet, decrease po intake and increase exercise as tolerated. Needs 7-8 hours of sleep nightly. Avoid trans fats, eat small, frequent meals every 4-5 hours with lean proteins, complex carbs and healthy fats. Minimize simple carbs and processed foods

## 2020-08-07 NOTE — Assessment & Plan Note (Signed)
Hydrate and monitor 

## 2020-08-07 NOTE — Progress Notes (Signed)
Subjective:    Patient ID: Kyle Long, male    DOB: [birth year], 75 y.o.   MRN: [id number]  Chief Complaint  Patient presents with  . Annual Exam    HPI Patient is in today for annual preventative exam and follow up on chronic medical concerns. No acute concerns. Does note some urinary frequency but no polydipsia. He is trying to eat a heart healthy diet and stay active. They stay in much of the time. His psoriasis is much better since his dermatologist put him on a stronger cream. He cannot remember the name of the cream. He had his colonoscopy in June of 2021 and tolerated it well. They did find some polyps but due to his age he will not have another one for screening purposes. Denies CP/palp/SOB/HA/congestion/fevers/GI or GU c/o. Taking meds as prescribed. He tolerated all 3 of his Covid shots.   Past Medical History:  Diagnosis Date  . Allergic state 06/26/2015  . Amnesia 2009   isolated;for 30 mins w/elevated BP  . Arthritis   . Cancer ([place])    skin / top head  . Cataract    bilateral repair  . Cellulitis 2011   LUE (no PMH of MRSA)/left elbow  . Chronic kidney disease    resolved  . Diverticulosis   . Diverticulosis of colon   . GERD (gastroesophageal reflux disease)    OCC HEARTBURN  . Gilbert's syndrome   . Hematuria 2009   Dr Burnell Blanks  . Hyperglycemia   . Hyperlipidemia   . Hypertension   . Lung cancer, lower lobe (HCC)    Stage IIA (T1,N1)  . Lymphoma ([place])    Low grad  . Neuromuscular disorder ([place])    2010 TEMPORARY MEMORY LOSS 1-1.5 HR WAS CHECKED OUT AT [facility] , NOTHING FOUND  . Peptic ulcer 1995  . Peyronie disease   . Preventative health care 12/19/2015  . Primary cancer of left lower lobe of lung ([place]) 11/27/2011  . Recurrent upper respiratory infection (URI)    09/2011  BRONCHITIS  . SCC (squamous cell carcinoma) 01/21/2009   Qualifier: Diagnosis of  By: Linna Darner MD, Gwyndolyn Saxon   ? Basal Cell      Past Surgical History:  Procedure  Laterality Date  . chemo treatment      4 treatments that last 7 hours each/last treatmebt in 2013  . COLONOSCOPY  2015  . COLONOSCOPY W/ POLYPECTOMY  2005   due 2015  . CYSTOSCOPY  2009  . LOBECTOMY  11/02/2011   Procedure: LOBECTOMY;  Surgeon: Melrose Nakayama, MD;  Location: [facility];  Service: Thoracic;  Laterality: Left;  LEFT LOWER LOBECTOMY  . REFRACTIVE SURGERY     Bil  . SKIN BIOPSY     4 0r 5  . Squamous Cell Cancer Excision  12/13/2016  . SUPRACLAVICAL NODE BIOPSY Left 10/15/2014   Procedure: LEFT SUPRACLAVICAL LYMPH NODE BIOPSY;  Surgeon: Melrose Nakayama, MD;  Location: [facility];  Service: Thoracic;  Laterality: Left;  LEFT SUPRACLAVICAL LYMPH NODE BIOPSY  . VASECTOMY    . VEIN SURGERY     R ankle    Family History  Problem Relation Age of Onset  . Stroke Mother        in 63s  . Hypertension Mother   . Breast cancer Mother   . Neuropathy Mother   . Melanoma Father   . Heart attack Father 36       stents  . Dementia Father   .  Hypertension Sister   . Thyroid disease Sister        ??  . Multiple myeloma Sister   . Cancer Sister   . Stomach cancer Maternal Grandfather 84  . Lung cancer Maternal Grandfather   . Cancer Paternal Grandmother        lung, nonsmoker  . Headache Daughter   . Diabetes Paternal Aunt   . Colon cancer Neg Hx   . Colon polyps Neg Hx   . Esophageal cancer Neg Hx   . Rectal cancer Neg Hx     Social History   Socioeconomic History  . Marital status: Married    Spouse name: Not on file  . Number of children: Not on file  . Years of education: Not on file  . Highest education level: Not on file  Occupational History  . Occupation: retired  Tobacco Use  . Smoking status: Former Smoker    Packs/day: 1.00    Years: 15.00    Pack years: 15.00    Quit date: 07/31/1979    Years since quitting: 41.0  . Smokeless tobacco: Former Systems developer    Types: Secondary school teacher  . Vaping Use: Never used  Substance and Sexual Activity  . Alcohol  use: Yes    Comment: Daily 3 glasses of vodka  . Drug use: No  . Sexual activity: Not Currently    Comment: lives with wife, retired from [employer], ran distribution. No dietary restrictions.  Other Topics Concern  . Not on file  Social History Narrative   Reg exercise   Social Determinants of Health   Financial Resource Strain: Low Risk   . Difficulty of Paying Living Expenses: Not hard at all  Food Insecurity: No Food Insecurity  . Worried About Charity fundraiser in the Last Year: Never true  . Ran Out of Food in the Last Year: Never true  Transportation Needs: No Transportation Needs  . Lack of Transportation (Medical): No  . Lack of Transportation (Non-Medical): No  Physical Activity: Not on file  Stress: Not on file  Social Connections: Not on file  Intimate Partner Violence: Not on file    Outpatient Medications Prior to Visit  Medication Sig Dispense Refill  . chlorthalidone (HYGROTON) 25 MG tablet TAKE 1 TABLET DAILY 90 tablet 1  . diltiazem (CARDIZEM CD) 240 MG 24 hr capsule TAKE 1 CAPSULE DAILY 90 capsule 1  . losartan (COZAAR) 100 MG tablet TAKE 1/2 TABLET DAILY 45 tablet 0  . VITAMIN D PO Take by mouth.    . metoprolol tartrate (LOPRESSOR) 50 MG tablet TAKE 1/2 TABLET TWICE A DAY 30 tablet 0  . Ascorbic Acid (VITAMIN C PO) Take by mouth.    . doxycycline (VIBRAMYCIN) 100 MG capsule Take 1 capsule (100 mg total) by mouth 2 (two) times daily. 20 capsule 0  . Promethazine-Codeine 6.25-10 MG/5ML SOLN Take 5 mLs by mouth 2 (two) times daily as needed. 80 mL 0   No facility-administered medications prior to visit.    Allergies  Allergen Reactions  . Lisinopril Cough    Review of Systems  Constitutional: Negative for chills, fever and malaise/fatigue.  HENT: Negative for congestion and hearing loss.   Eyes: Negative for discharge.  Respiratory: Negative for cough, sputum production and shortness of breath.   Cardiovascular: Negative for chest pain,  palpitations and leg swelling.  Gastrointestinal: Negative for abdominal pain, blood in stool, constipation, diarrhea, heartburn, nausea and vomiting.  Genitourinary: Negative for dysuria,  frequency, hematuria and urgency.  Musculoskeletal: Negative for back pain, falls and myalgias.  Skin: Positive for rash.  Neurological: Negative for dizziness, sensory change, loss of consciousness, weakness and headaches.  Endo/Heme/Allergies: Negative for environmental allergies. Does not bruise/bleed easily.  Psychiatric/Behavioral: Negative for depression and suicidal ideas. The patient is not nervous/anxious and does not have insomnia.        Objective:    Physical Exam Vitals and nursing note reviewed.  Constitutional:      General: He is not in acute distress.    Appearance: Normal appearance. He is well-developed and well-nourished. He is not ill-appearing.  HENT:     Head: Normocephalic and atraumatic.     Right Ear: External ear normal. There is no impacted cerumen.     Left Ear: External ear normal. There is no impacted cerumen.  Eyes:     General:        Right eye: No discharge.        Left eye: No discharge.     Extraocular Movements: Extraocular movements intact.     Conjunctiva/sclera: Conjunctivae normal.     Pupils: Pupils are equal, round, and reactive to light.  Cardiovascular:     Rate and Rhythm: Normal rate and regular rhythm.     Heart sounds: Normal heart sounds. No murmur heard.   Pulmonary:     Effort: Pulmonary effort is normal.     Breath sounds: Normal breath sounds. No wheezing.  Abdominal:     General: Bowel sounds are normal.     Palpations: Abdomen is soft. There is no mass.     Tenderness: There is no abdominal tenderness. There is no guarding.  Musculoskeletal:        General: No edema.     Cervical back: Normal range of motion and neck supple.     Right lower leg: No edema.     Left lower leg: No edema.  Skin:    General: Skin is warm and dry.   Neurological:     Mental Status: He is alert and oriented to person, place, and time.  Psychiatric:        Mood and Affect: Mood and affect normal.        Behavior: Behavior normal.     BP 122/74   Pulse 82   Temp 98.1 F (36.7 C) (Oral)   Resp 16   Ht 5' 11"  (1.803 m)   Wt 233 lb 6.4 oz (105.9 kg)   SpO2 99%   BMI 32.55 kg/m  Wt Readings from Last 3 Encounters:  08/04/20 233 lb 6.4 oz (105.9 kg)  06/02/20 225 lb (102.1 kg)  03/07/20 233 lb (105.7 kg)    Diabetic Foot Exam - Simple   No data filed    Lab Results  Component Value Date   WBC 10.0 08/04/2020   HGB 15.1 08/04/2020   HCT 43.5 08/04/2020   PLT 233.0 08/04/2020   GLUCOSE 123 (H) 08/04/2020   CHOL 208 (H) 08/04/2020   TRIG (H) 08/04/2020    420.0 Triglyceride is over 400; calculations on Lipids are invalid.   HDL 46.80 08/04/2020   LDLDIRECT 99.0 08/04/2020   LDLCALC 86 12/15/2015   ALT 38 08/04/2020   AST 34 08/04/2020   NA 135 08/04/2020   K 3.9 08/04/2020   CL 95 (L) 08/04/2020   CREATININE 1.51 (H) 08/04/2020   BUN 25 (H) 08/04/2020   CO2 32 08/04/2020   TSH 2.92 08/04/2020   PSA 2.49  06/11/2019   INR 1.03 10/12/2014   HGBA1C 5.9 08/04/2020    Lab Results  Component Value Date   TSH 2.92 08/04/2020   Lab Results  Component Value Date   WBC 10.0 08/04/2020   HGB 15.1 08/04/2020   HCT 43.5 08/04/2020   MCV 95.7 08/04/2020   PLT 233.0 08/04/2020   Lab Results  Component Value Date   NA 135 08/04/2020   K 3.9 08/04/2020   CHLORIDE 104 02/15/2017   CO2 32 08/04/2020   GLUCOSE 123 (H) 08/04/2020   BUN 25 (H) 08/04/2020   CREATININE 1.51 (H) 08/04/2020   BILITOT 1.1 08/04/2020   ALKPHOS 46 08/04/2020   AST 34 08/04/2020   ALT 38 08/04/2020   PROT 7.3 08/04/2020   ALBUMIN 4.7 08/04/2020   CALCIUM 9.9 08/04/2020   ANIONGAP 12 02/22/2020   EGFR 49 (L) 02/15/2017   GFR 44.65 (L) 08/04/2020   Lab Results  Component Value Date   CHOL 208 (H) 08/04/2020   Lab Results   Component Value Date   HDL 46.80 08/04/2020   Lab Results  Component Value Date   LDLCALC 86 12/15/2015   Lab Results  Component Value Date   TRIG (H) 08/04/2020    420.0 Triglyceride is over 400; calculations on Lipids are invalid.   Lab Results  Component Value Date   CHOLHDL 4 08/04/2020   Lab Results  Component Value Date   HGBA1C 5.9 08/04/2020       Assessment & Plan:   Problem List Items Addressed This Visit    Hyperlipidemia, mixed    Encouraged heart healthy diet, increase exercise, avoid trans fats, consider a krill oil cap daily      Relevant Medications   metoprolol tartrate (LOPRESSOR) 25 MG tablet   Other Relevant Orders   Lipid panel (Completed)   Essential hypertension    Well controlled, no changes to meds. Encouraged heart healthy diet such as the DASH diet and exercise as tolerated.       Relevant Medications   metoprolol tartrate (LOPRESSOR) 25 MG tablet   Other Relevant Orders   CBC (Completed)   Comprehensive metabolic panel (Completed)   TSH (Completed)   COLONIC POLYPS, HX OF    Had last colonoscopy with Dr Hilarie Fredrickson in June of 2021. No repeat colonoscopy for screening purposes recommended.       CKD (chronic kidney disease) - Primary    Hydrate and monitor      Hyperglycemia    hgba1c acceptable, minimize simple carbs. Increase exercise as tolerated.       Relevant Orders   Hemoglobin A1c (Completed)   Obesity    Encouraged heart healthy diet such as MIND diet, decrease po intake and increase exercise as tolerated. Needs 7-8 hours of sleep nightly. Avoid trans fats, eat small, frequent meals every 4-5 hours with lean proteins, complex carbs and healthy fats. Minimize simple carbs and processed foods      Preventative health care    Patient encouraged to maintain heart healthy diet, regular exercise, adequate sleep. Consider daily probiotics. Take medications as prescribed. Labs ordered and reviewed.       Psoriasis    Right  elbow and right abdomen only patches with new steroid cream from dermatology      Hypertension    Well controlled, no changes to meds. Encouraged heart healthy diet such as the DASH diet and exercise as tolerated.       Relevant Medications   metoprolol tartrate (LOPRESSOR)  25 MG tablet    Other Visit Diagnoses    Urinary frequency          I have discontinued Lanell Matar Merriwether's Ascorbic Acid (VITAMIN C PO), doxycycline, Promethazine-Codeine, and metoprolol tartrate. I am also having him start on metoprolol tartrate. Additionally, I am having him maintain his VITAMIN D PO, chlorthalidone, diltiazem, and losartan.  Meds ordered this encounter  Medications  . metoprolol tartrate (LOPRESSOR) 25 MG tablet    Sig: Take 1 tablet (25 mg total) by mouth 2 (two) times daily.    Dispense:  180 tablet    Refill:  1    Cancel all prescriptions for this medications     [name], MD

## 2020-08-07 NOTE — Assessment & Plan Note (Signed)
Had last colonoscopy with Dr Hilarie Fredrickson in June of 2021. No repeat colonoscopy for screening purposes recommended.

## 2020-08-07 NOTE — Assessment & Plan Note (Signed)
Encouraged heart healthy diet, increase exercise, avoid trans fats, consider a krill oil cap daily 

## 2020-08-07 NOTE — Assessment & Plan Note (Signed)
Well controlled, no changes to meds. Encouraged heart healthy diet such as the DASH diet and exercise as tolerated.  °

## 2020-08-24 DIAGNOSIS — D485 Neoplasm of uncertain behavior of skin: Secondary | ICD-10-CM | POA: Diagnosis not present

## 2020-08-24 DIAGNOSIS — L905 Scar conditions and fibrosis of skin: Secondary | ICD-10-CM | POA: Diagnosis not present

## 2020-08-26 ENCOUNTER — Encounter: Payer: Self-pay | Admitting: *Deleted

## 2020-10-11 DIAGNOSIS — L570 Actinic keratosis: Secondary | ICD-10-CM | POA: Diagnosis not present

## 2020-10-11 DIAGNOSIS — L400 Psoriasis vulgaris: Secondary | ICD-10-CM | POA: Diagnosis not present

## 2020-10-11 DIAGNOSIS — L579 Skin changes due to chronic exposure to nonionizing radiation, unspecified: Secondary | ICD-10-CM | POA: Diagnosis not present

## 2020-10-18 ENCOUNTER — Other Ambulatory Visit: Payer: Self-pay | Admitting: *Deleted

## 2020-10-18 DIAGNOSIS — H5211 Myopia, right eye: Secondary | ICD-10-CM | POA: Diagnosis not present

## 2020-10-18 MED ORDER — DILTIAZEM HCL ER COATED BEADS 240 MG PO CP24: 240.0000 mg | ORAL_CAPSULE | Freq: Every day | ORAL | 1 refills | Status: DC

## 2020-10-18 MED ORDER — CHLORTHALIDONE 25 MG PO TABS: 25.0000 mg | ORAL_TABLET | Freq: Every day | ORAL | 1 refills | Status: DC

## 2020-12-12 ENCOUNTER — Other Ambulatory Visit: Payer: Self-pay | Admitting: *Deleted

## 2020-12-12 MED ORDER — LOSARTAN POTASSIUM 100 MG PO TABS: ORAL_TABLET | ORAL | 1 refills | Status: DC

## 2021-01-18 DIAGNOSIS — N183 Chronic kidney disease, stage 3 unspecified: Secondary | ICD-10-CM | POA: Diagnosis not present

## 2021-02-02 ENCOUNTER — Ambulatory Visit (INDEPENDENT_AMBULATORY_CARE_PROVIDER_SITE_OTHER): Payer: Medicare HMO | Admitting: Family Medicine

## 2021-02-02 ENCOUNTER — Ambulatory Visit: Payer: Medicare HMO | Attending: Internal Medicine

## 2021-02-02 ENCOUNTER — Other Ambulatory Visit: Payer: Self-pay

## 2021-02-02 VITALS — BP 122/80 | HR 89 | Temp 98.4°F | Resp 16 | Wt 234.0 lb

## 2021-02-02 DIAGNOSIS — M720 Palmar fascial fibromatosis [Dupuytren]: Secondary | ICD-10-CM | POA: Diagnosis not present

## 2021-02-02 DIAGNOSIS — N183 Chronic kidney disease, stage 3 unspecified: Secondary | ICD-10-CM

## 2021-02-02 DIAGNOSIS — C8300 Small cell B-cell lymphoma, unspecified site: Secondary | ICD-10-CM | POA: Diagnosis not present

## 2021-02-02 DIAGNOSIS — I10 Essential (primary) hypertension: Secondary | ICD-10-CM | POA: Diagnosis not present

## 2021-02-02 DIAGNOSIS — R739 Hyperglycemia, unspecified: Secondary | ICD-10-CM | POA: Diagnosis not present

## 2021-02-02 DIAGNOSIS — Z23 Encounter for immunization: Secondary | ICD-10-CM

## 2021-02-02 DIAGNOSIS — E782 Mixed hyperlipidemia: Secondary | ICD-10-CM | POA: Diagnosis not present

## 2021-02-02 LAB — CBC
HCT: 42.9 % (ref 39.0–52.0)
Hemoglobin: 14.9 g/dL (ref 13.0–17.0)
MCHC: 34.7 g/dL (ref 30.0–36.0)
MCV: 94.5 fl (ref 78.0–100.0)
Platelets: 245 10*3/uL (ref 150.0–400.0)
RBC: 4.54 Mil/uL (ref 4.22–5.81)
RDW: 12.7 % (ref 11.5–15.5)
WBC: 10.2 10*3/uL (ref 4.0–10.5)

## 2021-02-02 LAB — LIPID PANEL
Cholesterol: 179 mg/dL (ref 0–200)
HDL: 42 mg/dL (ref >39.00)
NonHDL: 136.95
Total CHOL/HDL Ratio: 4
Triglycerides: 236 mg/dL — ABNORMAL HIGH (ref 0.0–149.0)
VLDL: 47.2 mg/dL — ABNORMAL HIGH (ref 0.0–40.0)

## 2021-02-02 LAB — COMPREHENSIVE METABOLIC PANEL
ALT: 29 U/L (ref 0–53)
AST: 26 U/L (ref 0–37)
Albumin: 4.3 g/dL (ref 3.5–5.2)
Alkaline Phosphatase: 43 U/L (ref 39–117)
BUN: 22 mg/dL (ref 6–23)
CO2: 32 mEq/L (ref 19–32)
Calcium: 9.9 mg/dL (ref 8.4–10.5)
Chloride: 96 mEq/L (ref 96–112)
Creatinine, Ser: 1.44 mg/dL (ref 0.40–1.50)
GFR: 47.1 mL/min — ABNORMAL LOW (ref >60.00)
Glucose, Bld: 131 mg/dL — ABNORMAL HIGH (ref 70–99)
Potassium: 4 mEq/L (ref 3.5–5.1)
Sodium: 137 mEq/L (ref 135–145)
Total Bilirubin: 0.8 mg/dL (ref 0.2–1.2)
Total Protein: 6.9 g/dL (ref 6.0–8.3)

## 2021-02-02 LAB — TSH: TSH: 2.54 u[IU]/mL (ref 0.35–5.50)

## 2021-02-02 LAB — HEMOGLOBIN A1C: Hgb A1c MFr Bld: 6 % (ref 4.6–6.5)

## 2021-02-02 LAB — LDL CHOLESTEROL, DIRECT: Direct LDL: 102 mg/dL

## 2021-02-02 MED ORDER — METOPROLOL TARTRATE 25 MG PO TABS: 25.0000 mg | ORAL_TABLET | Freq: Two times a day (BID) | ORAL | 2 refills | Status: DC

## 2021-02-02 NOTE — Progress Notes (Signed)
   Covid-19 Vaccination Clinic  Name:  CORBETT MOULDER    MRN: [id number] DOB: 30-Nov-1945  02/02/2021  Mr. Greenup was observed post Covid-19 immunization for 15 minutes without incident. He was provided with Vaccine Information Sheet and instruction to access the V-Safe system.   Mr. Ake was instructed to call 911 with any severe reactions post vaccine: Difficulty breathing  Swelling of face and throat  A fast heartbeat  A bad rash all over body  Dizziness and weakness   Immunizations Administered     Name Date Dose VIS Date Route   PFIZER Comrnaty(Gray TOP) Covid-19 Vaccine 02/02/2021 10:26 AM 0.3 mL 07/07/2020 Intramuscular   Manufacturer: [place]   Lot: [id number]   [place]: [phone]

## 2021-02-02 NOTE — Patient Instructions (Addendum)
Drop vitamin D to 2000 IU daily [email or web address] for covid PCR Paxlovid is the new covid med, call us for prescription if you are positive and check with your local pharmacy to make sure they have it.  Dupuytren's Contracture Dupuytren's contracture is a condition in which tissue under the skin of the palm becomes thick. This causes one or more of the fingers to curl inward (contract) toward the palm. After a while, the fingers may not be able to straighten out. This condition affects some or all of the fingers and the palm of thehand. This condition may affect one or both hands. Dupuytren's contracture is a long-term (chronic) condition that develops (progresses) slowly over time. There is no cure, but symptoms can be managed and progression can be slowed with treatment. This condition is usually notdangerous or painful, but it can interfere with everyday tasks. What are the causes?  This condition is caused by tissue (fascia) in the palm that gets thicker and tighter. When the fascia thickens, it pulls on the cords of tissue (tendons) that control finger movement. This causes the fingers to contract. The cause of fascia thickening is not known. However, the condition is often passed along from parent to child (inherited). What increases the risk? The following factors may make you more likely to develop this condition: Being 34 years of age or older. Being male. Having a family history of this condition. Using tobacco products, including cigarettes, chewing tobacco, and e-cigarettes. Drinking alcohol excessively. Having diabetes. Having a seizure disorder. What are the signs or symptoms? Early symptoms of this condition may include: Thick, puckered skin on the hand. One or more lumps (nodules) on the palm. Nodules may be tender when they first appear, but they are generally painless. Later symptoms of this condition may include: Thick cords of tissue in the palm. Fingers curled up toward  the palm. Inability to straighten the fingers into their normal position. Though this condition is usually painless, you may have discomfort when holdingor grabbing objects. How is this diagnosed? This condition is diagnosed with a physical exam, which may include: Looking at your hands and feeling your palms. This is to check for thickened fascia and nodules. Measuring finger motion. Doing the [place] tabletop test. You may be asked to try to put your hand on a surface, with your palm down and your fingers straight out. How is this treated? There is no cure for this condition, but treatment can relieve discomfort and make symptoms more manageable. Treatment options may include: Physical therapy. This can strengthen your hand and increase flexibility. Occupational therapy. This can help you with everyday tasks that may be more difficult because of your condition. Shots (injections). Substances may be injected into your hand, such as: Medicines that help to decrease swelling (corticosteroids). Proteins (collagenase) to weaken thick tissue. After a collagenase injection, your health care provider may stretch your fingers. Needle aponeurotomy. A needle is pushed through the skin and into the fascia. Moving the needle against the fascia can weaken or break up the thick tissue. Surgery. This may be needed if your condition causes discomfort or interferes with everyday activities. Physical therapy is usually needed after surgery. No treatment is guaranteed to cure this condition. Recurrence of symptoms iscommon. Follow these instructions at home: Hand care Take these actions to help protect your hand from possible injury: Use tools that have padded grips. Wear protective gloves while you work with your hands. Avoid repetitive hand movements. General instructions Take over-the-counter and prescription medicines  only as told by your health care provider. Manage any other conditions that you have,  such as diabetes. If physical therapy was prescribed, do exercises as told by your health care provider. Do not use any products that contain nicotine or tobacco, such as cigarettes, e-cigarettes, and chewing tobacco. If you need help quitting, ask your health care provider. If you drink alcohol: Limit how much you use to: 0-1 drink a day for women. 0-2 drinks a day for men. Be aware of how much alcohol is in your drink. In the [place] one drink equals one 12 oz bottle of beer (355 mL), one 5 oz glass of wine (148 mL), or one 1 oz glass of hard liquor (44 mL). Keep all follow-up visits as told by your health care provider. This is important. Contact a health care provider if: You develop new symptoms, or your symptoms get worse. You have pain that gets worse or does not get better with medicine. You have difficulty or discomfort with everyday tasks. You develop numbness or tingling. Get help right away if: You have severe pain. Your fingers change color or become unusually cold. Summary Dupuytren's contracture is a condition in which tissue under the skin of the palm becomes thick. This condition is caused by tissue (fascia) that thickens. When it thickens, it pulls on the cords of tissue (tendons) that control finger movement and makes the fingers to contract. You are more likely to develop this condition if you are a man, are over 33 years of age, have a family history of the condition, and drink a lot of alcohol. This condition can be treated with physical and occupational therapy, injections, and surgery. Follow instructions about how to care for your hand. Get help right away if you have severe pain or your fingers change color or become cold. This information is not intended to replace advice given to you by your health care provider. Make sure you discuss any questions you have with your healthcare provider. Document Revised: 02/04/2018 Document Reviewed: 02/04/2018 Elsevier Patient  Education  [place].

## 2021-02-02 NOTE — Progress Notes (Signed)
Patient ID: Kyle Long, male    DOB: Mar 10, 1946  Age: 75 y.o. MRN: [id number]    Subjective:  Subjective  HPI Kyle Long presents for office visit today for follow up on htn and tachycardia. He states that he has no recent hospitalizations or ER visits to report. He notes that he has limited ROM in his left and right hands due to dupuytren's contracture. He also notes having a left hand ganglion cyst. He states that neither issues are affecting him negatively. He denies CP/palp/SOB/HA/congestion/fevers/GI or GU c/o. Taking meds as prescribed. He reports that his last BP reading yesterday was 133/73. He states that his wife has been incorporating more vegetables into their diet.   Review of Systems  Constitutional:  Negative for chills, fatigue and fever.  HENT:  Negative for congestion, rhinorrhea, sinus pressure, sinus pain and sore throat.   Eyes:  Negative for pain.  Respiratory:  Negative for cough and shortness of breath.   Cardiovascular:  Negative for chest pain, palpitations and leg swelling.  Gastrointestinal:  Negative for abdominal pain, blood in stool, diarrhea, nausea and vomiting.  Genitourinary:  Negative for flank pain, frequency and penile pain.  Musculoskeletal:  Negative for back pain.  Neurological:  Negative for headaches.   History Past Medical History:  Diagnosis Date   Allergic state 06/26/2015   Amnesia 2009   isolated;for 30 mins w/elevated BP   Arthritis    Cancer ([place])    skin / top head   Cataract    bilateral repair   Cellulitis 2011   LUE (no PMH of MRSA)/left elbow   Chronic kidney disease    resolved   Diverticulosis    Diverticulosis of colon    GERD (gastroesophageal reflux disease)    OCC HEARTBURN   Gilbert's syndrome    Hematuria 2009   Dr Burnell Blanks   Hyperglycemia    Hyperlipidemia    Hypertension    Lung cancer, lower lobe (HCC)    Stage IIA (T1,N1)   Lymphoma ([place])    Low grad   Neuromuscular disorder ([place])    2010  TEMPORARY MEMORY LOSS 1-1.5 HR WAS CHECKED OUT AT [facility] , NOTHING FOUND   Peptic ulcer 1995   Peyronie disease    Preventative health care 12/19/2015   Primary cancer of left lower lobe of lung ([place]) 11/27/2011   Recurrent upper respiratory infection (URI)    09/2011  BRONCHITIS   SCC (squamous cell carcinoma) 01/21/2009   Qualifier: Diagnosis of  By: Linna Darner MD, Gwyndolyn Saxon   ? Basal Cell      He has a past surgical history that includes Colonoscopy w/ polypectomy (2005); Vasectomy; Cystoscopy (2009); Vein Surgery; Lobectomy (11/02/2011); Refractive surgery; chemo treatment; Skin biopsy; Supraclavical node biopsy (Left, 10/15/2014); Squamous Cell Cancer Excision (12/13/2016); and Colonoscopy (2015).   His family history includes Breast cancer in his mother; Cancer in his paternal grandmother and sister; Dementia in his father; Diabetes in his paternal aunt; Headache in his daughter; Heart attack (age of onset: 56) in his father; Hypertension in his mother and sister; Lung cancer in his maternal grandfather; Melanoma in his father; Multiple myeloma in his sister; Neuropathy in his mother; Stomach cancer (age of onset: 69) in his maternal grandfather; Stroke in his mother; Thyroid disease in his sister.He reports that he quit smoking about 41 years ago. He has a 15.00 pack-year smoking history. He has quit using smokeless tobacco.  His smokeless tobacco use included chew. He reports current alcohol  use. He reports that he does not use drugs.  Current Outpatient Medications on File Prior to Visit  Medication Sig Dispense Refill   chlorthalidone (HYGROTON) 25 MG tablet Take 1 tablet (25 mg total) by mouth daily. 90 tablet 1   diltiazem (CARDIZEM CD) 240 MG 24 hr capsule Take 1 capsule (240 mg total) by mouth daily. 90 capsule 1   losartan (COZAAR) 100 MG tablet TAKE 1/2 TABLET DAILY 45 tablet 1   No current facility-administered medications on file prior to visit.     Objective:  Objective   Physical Exam Constitutional:      General: He is not in acute distress.    Appearance: Normal appearance. He is not ill-appearing or toxic-appearing.  HENT:     Head: Normocephalic and atraumatic.     Right Ear: Tympanic membrane, ear canal and external ear normal.     Left Ear: Tympanic membrane, ear canal and external ear normal.     Nose: No congestion or rhinorrhea.  Eyes:     Extraocular Movements: Extraocular movements intact.     Pupils: Pupils are equal, round, and reactive to light.  Cardiovascular:     Rate and Rhythm: Normal rate and regular rhythm.     Pulses: Normal pulses.     Heart sounds: Normal heart sounds. No murmur heard. Pulmonary:     Effort: Pulmonary effort is normal. No respiratory distress.     Breath sounds: Normal breath sounds. No wheezing, rhonchi or rales.  Abdominal:     General: Bowel sounds are normal.     Palpations: Abdomen is soft. There is no mass.     Tenderness: no abdominal tenderness There is no guarding.     Hernia: No hernia is present.  Musculoskeletal:        General: Normal range of motion.     Cervical back: Normal range of motion and neck supple.  Skin:    General: Skin is warm and dry.  Neurological:     Mental Status: He is alert and oriented to person, place, and time.  Psychiatric:        Behavior: Behavior normal.   BP 122/80   Pulse 89   Temp 98.4 F (36.9 C)   Resp 16   Wt 234 lb (106.1 kg)   SpO2 94%   BMI 32.64 kg/m  Wt Readings from Last 3 Encounters:  02/02/21 234 lb (106.1 kg)  08/04/20 233 lb 6.4 oz (105.9 kg)  06/02/20 225 lb (102.1 kg)     Lab Results  Component Value Date   WBC 10.2 02/02/2021   HGB 14.9 02/02/2021   HCT 42.9 02/02/2021   PLT 245.0 02/02/2021   GLUCOSE 131 (H) 02/02/2021   CHOL 179 02/02/2021   TRIG 236.0 (H) 02/02/2021   HDL 42.00 02/02/2021   LDLDIRECT 102.0 02/02/2021   LDLCALC 86 12/15/2015   ALT 29 02/02/2021   AST 26 02/02/2021   NA 137 02/02/2021   K 4.0  02/02/2021   CL 96 02/02/2021   CREATININE 1.44 02/02/2021   BUN 22 02/02/2021   CO2 32 02/02/2021   TSH 2.54 02/02/2021   PSA 2.49 06/11/2019   INR 1.03 10/12/2014   HGBA1C 6.0 02/02/2021    DG Chest 2 View  Result Date: 06/02/2020 CLINICAL DATA:  Cough. EXAM: CHEST - 2 VIEW COMPARISON:  CT 02/22/2020.  Chest x-ray 08/12/2018. FINDINGS: Surgical clips right chest. Heart size normal. Postsurgical changes left lung again noted. Mild left base pleural-parenchymal thickening  suggesting scarring. Tiny left effusion cannot be excluded. No interim change from prior exam. Previously described right upper lung small ill-defined density may remain would be best evaluated by CT. Heart size normal. Degenerative changes scoliosis thoracic spine. IMPRESSION: Number postsurgical changes left lung again noted. Mild left base pleural-parenchymal thickening suggesting scarring. Tiny left pleural effusion cannot be excluded. No interim change from prior exam. 2. Previously identified small ill-defined right upper lobe pulmonary density may remain. Given its small size it would best be evaluated by CT. Reference is made to prior CT report 02/22/2020. Electronically Signed   By: Marcello Moores  Register   On: 06/02/2020 12:34     Assessment & Plan:  Plan    Meds ordered this encounter  Medications   metoprolol tartrate (LOPRESSOR) 25 MG tablet    Sig: Take 1 tablet (25 mg total) by mouth 2 (two) times daily.    Dispense:  180 tablet    Refill:  2     Problem List Items Addressed This Visit     Hyperlipidemia, mixed - Primary    Encourage heart healthy diet such as MIND or DASH diet, increase exercise, avoid trans fats, simple carbohydrates and processed foods, consider a krill or fish or flaxseed oil cap daily.        Relevant Medications   metoprolol tartrate (LOPRESSOR) 25 MG tablet   Other Relevant Orders   Lipid panel (Completed)   Essential hypertension    Well controlled, no changes to meds.  Encouraged heart healthy diet such as the DASH diet and exercise as tolerated.        Relevant Medications   metoprolol tartrate (LOPRESSOR) 25 MG tablet   Other Relevant Orders   Comprehensive metabolic panel (Completed)   TSH (Completed)   CBC (Completed)   Hyperglycemia    hgba1c acceptable, minimize simple carbs. Increase exercise as tolerated.       Relevant Orders   Hemoglobin A1c (Completed)   Malignant lymphoma, small lymphocytic ([place])     Follows with oncology         Dupuytren contracture    Bilateral fifth digit. Is not symptomatic or painful. If symptoms develop he will let us know if symptoms develop       RESOLVED: CKD (chronic kidney disease)    Follow-up: Return in about 7 months (around 08/21/2021) for annual exam.  I, Suezanne Jacquet, acting as a scribe for [name], MD, have documented all relevent documentation on behalf of [name], MD, as directed by [name], MD while in the presence of [name], MD.  I, [name], MD personally performed the services described in this documentation. All medical record entries made by the scribe were at my direction and in my presence. I have reviewed the chart and agree that the record reflects my personal performance and is accurate and complete

## 2021-02-05 DIAGNOSIS — M720 Palmar fascial fibromatosis [Dupuytren]: Secondary | ICD-10-CM | POA: Insufficient documentation

## 2021-02-05 NOTE — Assessment & Plan Note (Signed)
Follows with oncology

## 2021-02-05 NOTE — Assessment & Plan Note (Signed)
Encourage heart healthy diet such as MIND or DASH diet, increase exercise, avoid trans fats, simple carbohydrates and processed foods, consider a krill or fish or flaxseed oil cap daily.  °

## 2021-02-05 NOTE — Assessment & Plan Note (Signed)
Well controlled, no changes to meds. Encouraged heart healthy diet such as the DASH diet and exercise as tolerated.  °

## 2021-02-05 NOTE — Assessment & Plan Note (Signed)
hgba1c acceptable, minimize simple carbs. Increase exercise as tolerated.  

## 2021-02-05 NOTE — Assessment & Plan Note (Signed)
He is following with nephrology and reminded to hydrate well.

## 2021-02-05 NOTE — Assessment & Plan Note (Signed)
Bilateral fifth digit. Is not symptomatic or painful. If symptoms develop he will let us know if symptoms develop

## 2021-02-10 ENCOUNTER — Other Ambulatory Visit (HOSPITAL_BASED_OUTPATIENT_CLINIC_OR_DEPARTMENT_OTHER): Payer: Self-pay

## 2021-02-10 MED ORDER — COVID-19 MRNA VAC-TRIS(PFIZER) 30 MCG/0.3ML IM SUSP
INTRAMUSCULAR | 0 refills | Status: DC
  Filled 2021-02-10: qty 0.3, 1d supply, fill #0

## 2021-02-20 ENCOUNTER — Ambulatory Visit (HOSPITAL_COMMUNITY)
Admission: RE | Admit: 2021-02-20 | Discharge: 2021-02-20 | Disposition: A | Discharge status: Home / Self Care | Payer: Medicare HMO | Source: Ambulatory Visit | Attending: Internal Medicine | Admitting: Internal Medicine

## 2021-02-20 ENCOUNTER — Encounter (HOSPITAL_COMMUNITY): Payer: Self-pay

## 2021-02-20 ENCOUNTER — Inpatient Hospital Stay: Payer: Medicare HMO | Attending: Internal Medicine

## 2021-02-20 ENCOUNTER — Other Ambulatory Visit: Payer: Self-pay

## 2021-02-20 DIAGNOSIS — Z85118 Personal history of other malignant neoplasm of bronchus and lung: Secondary | ICD-10-CM | POA: Insufficient documentation

## 2021-02-20 DIAGNOSIS — C3490 Malignant neoplasm of unspecified part of unspecified bronchus or lung: Secondary | ICD-10-CM | POA: Insufficient documentation

## 2021-02-20 DIAGNOSIS — Z79899 Other long term (current) drug therapy: Secondary | ICD-10-CM | POA: Diagnosis not present

## 2021-02-20 DIAGNOSIS — C8301 Small cell B-cell lymphoma, lymph nodes of head, face, and neck: Secondary | ICD-10-CM | POA: Insufficient documentation

## 2021-02-20 DIAGNOSIS — R911 Solitary pulmonary nodule: Secondary | ICD-10-CM | POA: Diagnosis not present

## 2021-02-20 DIAGNOSIS — K760 Fatty (change of) liver, not elsewhere classified: Secondary | ICD-10-CM | POA: Diagnosis not present

## 2021-02-20 DIAGNOSIS — I700 Atherosclerosis of aorta: Secondary | ICD-10-CM | POA: Diagnosis not present

## 2021-02-20 DIAGNOSIS — K5730 Diverticulosis of large intestine without perforation or abscess without bleeding: Secondary | ICD-10-CM | POA: Diagnosis not present

## 2021-02-20 DIAGNOSIS — C8590 Non-Hodgkin lymphoma, unspecified, unspecified site: Secondary | ICD-10-CM | POA: Diagnosis not present

## 2021-02-20 LAB — CMP (CANCER CENTER ONLY)
ALT: 31 U/L (ref 0–44)
AST: 24 U/L (ref 15–41)
Albumin: 4 g/dL (ref 3.5–5.0)
Alkaline Phosphatase: 50 U/L (ref 38–126)
Anion gap: 12 (ref 5–15)
BUN: 25 mg/dL — ABNORMAL HIGH (ref 8–23)
CO2: 29 mmol/L (ref 22–32)
Calcium: 10.4 mg/dL — ABNORMAL HIGH (ref 8.9–10.3)
Chloride: 99 mmol/L (ref 98–111)
Creatinine: 1.48 mg/dL — ABNORMAL HIGH (ref 0.61–1.24)
GFR, Estimated: 49 mL/min — ABNORMAL LOW (ref >60)
Glucose, Bld: 131 mg/dL — ABNORMAL HIGH (ref 70–99)
Potassium: 3.8 mmol/L (ref 3.5–5.1)
Sodium: 140 mmol/L (ref 135–145)
Total Bilirubin: 0.9 mg/dL (ref 0.3–1.2)
Total Protein: 7.5 g/dL (ref 6.5–8.1)

## 2021-02-20 LAB — CBC WITH DIFFERENTIAL (CANCER CENTER ONLY)
Abs Immature Granulocytes: 0.06 10*3/uL (ref 0.00–0.07)
Basophils Absolute: 0.1 10*3/uL (ref 0.0–0.1)
Basophils Relative: 1 %
Eosinophils Absolute: 0.2 10*3/uL (ref 0.0–0.5)
Eosinophils Relative: 1 %
HCT: 42.1 % (ref 39.0–52.0)
Hemoglobin: 14.6 g/dL (ref 13.0–17.0)
Immature Granulocytes: 1 %
Lymphocytes Relative: 42 %
Lymphs Abs: 4.5 10*3/uL — ABNORMAL HIGH (ref 0.7–4.0)
MCH: 32.6 pg (ref 26.0–34.0)
MCHC: 34.7 g/dL (ref 30.0–36.0)
MCV: 94 fL (ref 80.0–100.0)
Monocytes Absolute: 0.7 10*3/uL (ref 0.1–1.0)
Monocytes Relative: 7 %
Neutro Abs: 5.1 10*3/uL (ref 1.7–7.7)
Neutrophils Relative %: 48 %
Platelet Count: 207 10*3/uL (ref 150–400)
RBC: 4.48 MIL/uL (ref 4.22–5.81)
RDW: 12.2 % (ref 11.5–15.5)
WBC Count: 10.6 10*3/uL — ABNORMAL HIGH (ref 4.0–10.5)
nRBC: 0 % (ref 0.0–0.2)

## 2021-02-22 ENCOUNTER — Inpatient Hospital Stay (HOSPITAL_BASED_OUTPATIENT_CLINIC_OR_DEPARTMENT_OTHER): Payer: Medicare HMO | Admitting: Internal Medicine

## 2021-02-22 ENCOUNTER — Other Ambulatory Visit: Payer: Self-pay

## 2021-02-22 VITALS — BP 152/76 | HR 70 | Temp 97.9°F | Resp 19 | Ht 71.0 in | Wt 232.2 lb

## 2021-02-22 DIAGNOSIS — C8301 Small cell B-cell lymphoma, lymph nodes of head, face, and neck: Secondary | ICD-10-CM | POA: Diagnosis not present

## 2021-02-22 DIAGNOSIS — Z85118 Personal history of other malignant neoplasm of bronchus and lung: Secondary | ICD-10-CM | POA: Diagnosis not present

## 2021-02-22 DIAGNOSIS — C3490 Malignant neoplasm of unspecified part of unspecified bronchus or lung: Secondary | ICD-10-CM | POA: Diagnosis not present

## 2021-02-22 DIAGNOSIS — Z79899 Other long term (current) drug therapy: Secondary | ICD-10-CM | POA: Diagnosis not present

## 2021-02-22 NOTE — Progress Notes (Signed)
[place] [phone]   [phone]  OFFICE PROGRESS NOTE  [name], MD [place] [address]  DIAGNOSIS AND STAGE:  1) small lymphocytic lymphoma involving the left supraclavicular lymph nodes diagnosed in February 2016. 2) Stage IIA (T1a, N1, M0) non-small cell lung cancer consistent with adenocarcinoma with negative EGFR mutation diagnosed in March of 2013.    PRIOR THERAPY:   1) S/P left video-assisted thoracoscopy, wedge resection of left lower lobe nodule, thoracoscopic left lower lobectomy, mediastinal lymph node dissection on 11/02/2011.   2) Adjuvant chemotherapy with cisplatin 75 mg/M2 and Alimta 500 mg/M2 every 3 weeks. She is status post 4 cycles.    CURRENT THERAPY: Observation.  INTERVAL HISTORY: Kyle Long 75 y.o. male returns to the clinic today for annual follow-up visit accompanied by his wife.  The patient is feeling fine today with no concerning complaints except for the psoriasis rash on the elbows.  He denied having any current chest pain, shortness of breath, cough or hemoptysis.  He denied having any fever or chills.  He has no nausea, vomiting, diarrhea or constipation.  He has no headache or visual changes.  He is here today for evaluation and repeat CT scan of the chest, abdomen and pelvis for restaging of his disease.  MEDICAL HISTORY: Past Medical History:  Diagnosis Date   Allergic state 06/26/2015   Amnesia 2009   isolated;for 30 mins w/elevated BP   Arthritis    Cancer ([facility])    skin / top head   Cataract    bilateral repair   Cellulitis 2011   LUE (no PMH of MRSA)/left elbow   Chronic kidney disease    resolved   Diverticulosis    Diverticulosis of colon    GERD (gastroesophageal reflux disease)    OCC HEARTBURN   Gilbert's syndrome    Hematuria 2009   Dr Burnell Blanks   Hyperglycemia    Hyperlipidemia    Hypertension    Lung cancer, lower lobe (HCC)    Stage  IIA (T1,N1)   Lymphoma ([facility])    Low grad   Neuromuscular disorder ([place])    2010 TEMPORARY MEMORY LOSS 1-1.5 HR WAS CHECKED OUT AT [place] , NOTHING FOUND   Peptic ulcer 1995   Peyronie disease    Preventative health care 12/19/2015   Primary cancer of left lower lobe of lung ([facility]) 11/27/2011   Recurrent upper respiratory infection (URI)    09/2011  BRONCHITIS   SCC (squamous cell carcinoma) 01/21/2009   Qualifier: Diagnosis of  By: Linna Darner MD, William   ? Basal Cell      ALLERGIES:  is allergic to lisinopril.  MEDICATIONS:  Current Outpatient Medications  Medication Sig Dispense Refill   chlorthalidone (HYGROTON) 25 MG tablet Take 1 tablet (25 mg total) by mouth daily. 90 tablet 1   diltiazem (CARDIZEM CD) 240 MG 24 hr capsule Take 1 capsule (240 mg total) by mouth daily. 90 capsule 1   losartan (COZAAR) 100 MG tablet TAKE 1/2 TABLET DAILY 45 tablet 1   metoprolol tartrate (LOPRESSOR) 25 MG tablet Take 1 tablet (25 mg total) by mouth 2 (two) times daily. 180 tablet 2   mometasone (ELOCON) 0.1 % cream Apply 1 application topically 2 (two) times daily as needed.     No current facility-administered medications for this visit.    SURGICAL HISTORY:  Past Surgical History:  Procedure Laterality Date  chemo treatment      4 treatments that last 7 hours each/last treatmebt in 2013   COLONOSCOPY  2015   COLONOSCOPY W/ POLYPECTOMY  2005   due 2015   CYSTOSCOPY  2009   LOBECTOMY  11/02/2011   Procedure: LOBECTOMY;  Surgeon: Melrose Nakayama, MD;  Location: [facility];  Service: Thoracic;  Laterality: Left;  LEFT LOWER LOBECTOMY   REFRACTIVE SURGERY     Bil   SKIN BIOPSY     4 0r 5   Squamous Cell Cancer Excision  12/13/2016   SUPRACLAVICAL NODE BIOPSY Left 10/15/2014   Procedure: LEFT SUPRACLAVICAL LYMPH NODE BIOPSY;  Surgeon: Melrose Nakayama, MD;  Location: [facility];  Service: Thoracic;  Laterality: Left;  LEFT SUPRACLAVICAL LYMPH NODE BIOPSY   VASECTOMY     VEIN SURGERY     R  ankle    REVIEW OF SYSTEMS:  A comprehensive review of systems was negative.   PHYSICAL EXAMINATION: General appearance: alert, cooperative and no distress Head: Normocephalic, without obvious abnormality, atraumatic Neck: no adenopathy, no JVD, supple, symmetrical, trachea midline and thyroid not enlarged, symmetric, no tenderness/mass/nodules Lymph nodes: Cervical, supraclavicular, and axillary nodes normal. Resp: clear to auscultation bilaterally Back: symmetric, no curvature. ROM normal. No CVA tenderness. Cardio: regular rate and rhythm, S1, S2 normal, no murmur, click, rub or gallop GI: soft, non-tender; bowel sounds normal; no masses,  no organomegaly Extremities: extremities normal, atraumatic, no cyanosis or edema  ECOG PERFORMANCE STATUS: 1 - Symptomatic but completely ambulatory  Blood pressure (!) 152/76, pulse 70, temperature 97.9 F (36.6 C), temperature source Tympanic, resp. rate 19, height _0  (1.803 m), weight 232 lb 3.2 oz (105.3 kg), SpO2 100 %.  LABORATORY DATA: Lab Results  Component Value Date   WBC 10.6 (H) 02/20/2021   HGB 14.6 02/20/2021   HCT 42.1 02/20/2021   MCV 94.0 02/20/2021   PLT 207 02/20/2021      Chemistry      Component Value Date/Time   NA 140 02/20/2021 0920   NA 137 02/15/2017 1053   K 3.8 02/20/2021 0920   K 4.4 02/15/2017 1053   CL 99 02/20/2021 0920   CL 104 09/03/2012 1003   CO2 29 02/20/2021 0920   CO2 25 02/15/2017 1053   BUN 25 (H) 02/20/2021 0920   BUN 27.1 (H) 02/15/2017 1053   CREATININE 1.48 (H) 02/20/2021 0920   CREATININE 1.4 (H) 02/15/2017 1053      Component Value Date/Time   CALCIUM 10.4 (H) 02/20/2021 0920   CALCIUM 10.0 02/15/2017 1053   ALKPHOS 50 02/20/2021 0920   ALKPHOS 51 02/15/2017 1053   AST 24 02/20/2021 0920   AST 30 02/15/2017 1053   ALT 31 02/20/2021 0920   ALT 37 02/15/2017 1053   BILITOT 0.9 02/20/2021 0920   BILITOT 0.93 02/15/2017 1053       RADIOGRAPHIC STUDIES: CT Abdomen Pelvis  Wo Contrast  Result Date: 02/20/2021 CLINICAL DATA:  Non-small cell lung cancer staging. History also of low-grade lymphoma in a 75 year old male. EXAM: CT CHEST, ABDOMEN AND PELVIS WITHOUT CONTRAST TECHNIQUE: Multidetector CT imaging of the chest, abdomen and pelvis was performed following the standard protocol without IV contrast. COMPARISON:  Comparison made with February 20, 2019. Comparison also made with February 22, 2020. FINDINGS: CT CHEST FINDINGS Cardiovascular: Calcified atheromatous plaque in the thoracic aorta. No aneurysmal dilation. Heart size is stable and normal without pericardial effusion. Central pulmonary vasculature is normal caliber. Limited assessment of cardiovascular structures given  lack of intravenous contrast. Mediastinum/Nodes: Stable appearance of scattered lymph nodes throughout the chest none with pathologic enlargement or significant change from prior imaging. Esophagus grossly normal. Lungs/Pleura: Post LEFT lower lobectomy. Stable 9 x 8 mm RIGHT upper lobe pulmonary nodule with predominantly ground-glass features. Airways are patent. No consolidation or pleural effusion. No suspicious nodule in the LEFT chest. Musculoskeletal: See below for full musculoskeletal details. CT ABDOMEN PELVIS FINDINGS Hepatobiliary: Hepatic steatosis and hepatic cysts similar to previous imaging. No pericholecystic stranding. No biliary duct dilation grossly. Pancreas: Pancreas with smooth contours and without signs of adjacent inflammation. Spleen: Spleen normal size and contour. Adrenals/Urinary Tract: Adrenal glands are normal. Mild cortical scarring greatest on the RIGHT. No hydronephrosis. No nephrolithiasis. No perinephric stranding. Stomach/Bowel: Small duodenal diverticulum adjacent to second portion of the duodenum. No acute gastrointestinal process. Extensive colonic diverticulosis. No sign of adjacent inflammation. Normal appendix. Vascular/Lymphatic: Calcified atheromatous plaque of the abdominal  aorta. No aneurysmal dilation of the abdominal aorta. There is no gastrohepatic or hepatoduodenal ligament lymphadenopathy. No retroperitoneal or mesenteric lymphadenopathy. Numerous lymph nodes scattered about the retroperitoneum slightly increased in size and number compared to previous imaging though most less than a cm, largest is an 11 mm RIGHT common iliac lymph node previously 10 mm (image 103/2) other lymph nodes with minimal, similar enlargement Reproductive: Unremarkable. Other: Small fat containing umbilical hernia similar to the prior study. Small fat containing LEFT inguinal hernia. Musculoskeletal: Spinal degenerative changes without acute or destructive bone process. IMPRESSION: 1. Stable 9 x 8 mm sub solid RIGHT upper lobe pulmonary nodule. Indolent pulmonary neoplasm remains a differential consideration but there is no change since July of 2021. 2. Postoperative changes in the chest, no new or progressive findings in the chest. 3. Very slight increase in conspicuity of retroperitoneal and mesenteric lymph nodes, suggest attention on follow-up. Only 1 of these exceeds 1 cm in short axis. 4. Hepatic steatosis and hepatic cysts. 5. Extensive colonic diverticulosis without sign of adjacent inflammation. 6. Aortic atherosclerosis. Aortic Atherosclerosis (ICD10-I70.0). Electronically Signed   By: Zetta Bills M.D.   On: 02/20/2021 17:26   CT Chest Wo Contrast  Result Date: 02/20/2021 CLINICAL DATA:  Non-small cell lung cancer staging. History also of low-grade lymphoma in a 75 year old male. EXAM: CT CHEST, ABDOMEN AND PELVIS WITHOUT CONTRAST TECHNIQUE: Multidetector CT imaging of the chest, abdomen and pelvis was performed following the standard protocol without IV contrast. COMPARISON:  Comparison made with February 20, 2019. Comparison also made with February 22, 2020. FINDINGS: CT CHEST FINDINGS Cardiovascular: Calcified atheromatous plaque in the thoracic aorta. No aneurysmal dilation. Heart size is  stable and normal without pericardial effusion. Central pulmonary vasculature is normal caliber. Limited assessment of cardiovascular structures given lack of intravenous contrast. Mediastinum/Nodes: Stable appearance of scattered lymph nodes throughout the chest none with pathologic enlargement or significant change from prior imaging. Esophagus grossly normal. Lungs/Pleura: Post LEFT lower lobectomy. Stable 9 x 8 mm RIGHT upper lobe pulmonary nodule with predominantly ground-glass features. Airways are patent. No consolidation or pleural effusion. No suspicious nodule in the LEFT chest. Musculoskeletal: See below for full musculoskeletal details. CT ABDOMEN PELVIS FINDINGS Hepatobiliary: Hepatic steatosis and hepatic cysts similar to previous imaging. No pericholecystic stranding. No biliary duct dilation grossly. Pancreas: Pancreas with smooth contours and without signs of adjacent inflammation. Spleen: Spleen normal size and contour. Adrenals/Urinary Tract: Adrenal glands are normal. Mild cortical scarring greatest on the RIGHT. No hydronephrosis. No nephrolithiasis. No perinephric stranding. Stomach/Bowel: Small duodenal diverticulum adjacent to second  portion of the duodenum. No acute gastrointestinal process. Extensive colonic diverticulosis. No sign of adjacent inflammation. Normal appendix. Vascular/Lymphatic: Calcified atheromatous plaque of the abdominal aorta. No aneurysmal dilation of the abdominal aorta. There is no gastrohepatic or hepatoduodenal ligament lymphadenopathy. No retroperitoneal or mesenteric lymphadenopathy. Numerous lymph nodes scattered about the retroperitoneum slightly increased in size and number compared to previous imaging though most less than a cm, largest is an 11 mm RIGHT common iliac lymph node previously 10 mm (image 103/2) other lymph nodes with minimal, similar enlargement Reproductive: Unremarkable. Other: Small fat containing umbilical hernia similar to the prior study.  Small fat containing LEFT inguinal hernia. Musculoskeletal: Spinal degenerative changes without acute or destructive bone process. IMPRESSION: 1. Stable 9 x 8 mm sub solid RIGHT upper lobe pulmonary nodule. Indolent pulmonary neoplasm remains a differential consideration but there is no change since July of 2021. 2. Postoperative changes in the chest, no new or progressive findings in the chest. 3. Very slight increase in conspicuity of retroperitoneal and mesenteric lymph nodes, suggest attention on follow-up. Only 1 of these exceeds 1 cm in short axis. 4. Hepatic steatosis and hepatic cysts. 5. Extensive colonic diverticulosis without sign of adjacent inflammation. 6. Aortic atherosclerosis. Aortic Atherosclerosis (ICD10-I70.0). Electronically Signed   By: Zetta Bills M.D.   On: 02/20/2021 17:26     ASSESSMENT AND PLAN: This is a very pleasant 75 years old white male with: 1) stage IIA non-small cell lung cancer, adenocarcinoma status post wedge resection of the left lower lobe with lymph node dissection in April 2013 followed by adjuvant systemic chemotherapy with cisplatin and Alimta.   2) small lymphocytic lymphoma: He will continue on observation for now. The patient is currently on observation and he is feeling fine with no concerning complaints. He had repeat CT scan of the chest, abdomen pelvis performed recently.  I personally and independently reviewed the scan and discussed the results with the patient and his wife. His scan showed no concerning findings for disease progression. I recommended for him to continue on observation with repeat CT scan of the chest, abdomen pelvis in 1 year. The patient was advised to call immediately if he has any other concerning symptoms in the interval.  The patient voices understanding of current disease status and treatment options and is in agreement with the current care plan. All questions were answered. The patient knows to call the clinic with any  problems, questions or concerns. We can certainly see the patient much sooner if necessary.  Disclaimer: This note was dictated with voice recognition software. Similar sounding words can inadvertently be transcribed and may not be corrected upon review.

## 2021-02-24 ENCOUNTER — Telehealth: Payer: Self-pay | Admitting: Internal Medicine

## 2021-02-24 NOTE — Telephone Encounter (Signed)
Scheduled per los. Called and left msg. Mailed printout  °

## 2021-03-07 NOTE — Progress Notes (Addendum)
Subjective:   Kyle Long is a 75 y.o. male who presents for Medicare Annual/Subsequent preventive examination.  I connected with Lesly today by telephone and verified that I am speaking with the correct person using two identifiers. Location patient: home Location provider: work Persons participating in the virtual visit: patient, Marine scientist.    I discussed the limitations, risks, security and privacy concerns of performing an evaluation and management service by telephone and the availability of in person appointments. I also discussed with the patient that there may be a patient responsible charge related to this service. The patient expressed understanding and verbally consented to this telephonic visit.    Interactive audio and video telecommunications were attempted between this provider and patient, however failed, due to patient having technical difficulties OR patient did not have access to video capability.  We continued and completed visit with audio only.  Some vital signs may be absent or patient reported.   Time Spent with patient on telephone encounter: 20 minutes   Review of Systems     Cardiac Risk Factors include: advanced age (>51mn, >>52women);male gender;dyslipidemia;hypertension;sedentary lifestyle;obesity (BMI >30kg/m2)     Objective:    Today's Vitals   03/08/21 0859  Weight: 232 lb (105.2 kg)  Height: 5' 11"  (1.803 m)   Body mass index is 32.36 kg/m.  Advanced Directives 03/08/2021 08/26/2020 03/07/2020 02/24/2020 02/26/2018 11/18/2017 02/18/2017  Does Patient Have a Medical Advance Directive? Yes Yes Yes Yes Yes Yes Yes  Type of AParamedicof ALangleyLiving will Living will HPort [place]Living will Living will;Healthcare Power of ALakewood ClubLiving will HNeskowinLiving will HShawneeLiving will  Does patient want to make changes to medical advance  directive? - No - Patient declined No - Patient declined - - No - Patient declined -  Copy of [place] Chart? Yes - validated most recent copy scanned in chart (See row information) - Yes - validated most recent copy scanned in chart (See row information) - No - copy requested Yes No - copy requested  Pre-existing out of facility DNR order (yellow form or pink MOST form) - - - - - - -    Current Medications (verified) Outpatient Encounter Medications as of 03/08/2021  Medication Sig   chlorthalidone (HYGROTON) 25 MG tablet Take 1 tablet (25 mg total) by mouth daily.   diltiazem (CARDIZEM CD) 240 MG 24 hr capsule Take 1 capsule (240 mg total) by mouth daily.   losartan (COZAAR) 100 MG tablet TAKE 1/2 TABLET DAILY   metoprolol tartrate (LOPRESSOR) 25 MG tablet Take 1 tablet (25 mg total) by mouth 2 (two) times daily.   mometasone (ELOCON) 0.1 % cream Apply 1 application topically 2 (two) times daily as needed. Dr KArnoldo Lenisdermatology TBoykin Nearing  No facility-administered encounter medications on file as of 03/08/2021.    Allergies (verified) Lisinopril   History: Past Medical History:  Diagnosis Date   Allergic state 06/26/2015   Amnesia 2009   isolated;for 30 mins w/elevated BP   Arthritis    Cancer ([place]    skin / top head   Cataract    bilateral repair   Cellulitis 2011   LUE (no PMH of MRSA)/left elbow   Chronic kidney disease    resolved   Diverticulosis    Diverticulosis of colon    GERD (gastroesophageal reflux disease)    OCC HEARTBURN   Gilbert's syndrome    Hematuria  2009   Dr Burnell Blanks   Hyperglycemia    Hyperlipidemia    Hypertension    Lung cancer, lower lobe ([place])    Stage IIA (T1,N1)   Lymphoma ([place])    Low grad   Neuromuscular disorder ([place])    2010 TEMPORARY MEMORY LOSS 1-1.5 HR WAS CHECKED OUT AT [address] , NOTHING FOUND   Peptic ulcer 1995   Peyronie disease    Preventative health care 12/19/2015   Primary  cancer of left lower lobe of lung ([place]) 11/27/2011   Recurrent upper respiratory infection (URI)    09/2011  BRONCHITIS   SCC (squamous cell carcinoma) 01/21/2009   Qualifier: Diagnosis of  By: Linna Darner MD, William   ? Basal Cell     Past Surgical History:  Procedure Laterality Date   chemo treatment      4 treatments that last 7 hours each/last treatmebt in 2013   COLONOSCOPY  2015   COLONOSCOPY W/ POLYPECTOMY  2005   due 2015   CYSTOSCOPY  2009   LOBECTOMY  11/02/2011   Procedure: LOBECTOMY;  Surgeon: Melrose Nakayama, MD;  Location: [place];  Service: Thoracic;  Laterality: Left;  LEFT LOWER LOBECTOMY   REFRACTIVE SURGERY     Bil   SKIN BIOPSY     4 0r 5   Squamous Cell Cancer Excision  12/13/2016   SUPRACLAVICAL NODE BIOPSY Left 10/15/2014   Procedure: LEFT SUPRACLAVICAL LYMPH NODE BIOPSY;  Surgeon: Melrose Nakayama, MD;  Location: [facility];  Service: Thoracic;  Laterality: Left;  LEFT SUPRACLAVICAL LYMPH NODE BIOPSY   VASECTOMY     VEIN SURGERY     R ankle   Family History  Problem Relation Age of Onset   Stroke Mother        in 6s   Hypertension Mother    Breast cancer Mother    Neuropathy Mother    Melanoma Father    Heart attack Father 4       stents   Dementia Father    Hypertension Sister    Thyroid disease Sister        ??   Multiple myeloma Sister    Cancer Sister    Stomach cancer Maternal Grandfather 79   Lung cancer Maternal Grandfather    Cancer Paternal Grandmother        lung, nonsmoker   Headache Daughter    Diabetes Paternal Aunt    Colon cancer Neg Hx    Colon polyps Neg Hx    Esophageal cancer Neg Hx    Rectal cancer Neg Hx    Social History   Socioeconomic History   Marital status: Married    Spouse name: Not on file   Number of children: Not on file   Years of education: Not on file   Highest education level: Not on file  Occupational History   Occupation: retired  Tobacco Use   Smoking status: Former    Packs/day: 1.00    Years:  15.00    Pack years: 15.00    Types: Cigarettes    Quit date: 07/31/1979    Years since quitting: 41.6   Smokeless tobacco: Former    Types: Nurse, children's Use: Never used  Substance and Sexual Activity   Alcohol use: Yes    Comment: Daily 3 glasses of vodka   Drug use: No   Sexual activity: Not Currently    Comment: lives with wife, retired from [employer],  ran distribution. No dietary restrictions.  Other Topics Concern   Not on file  Social History Narrative   Reg exercise   Social Determinants of Health   Financial Resource Strain: Not on file  Food Insecurity: Not on file  Transportation Needs: Not on file  Physical Activity: Inactive   Days of Exercise per Week: 0 days   Minutes of Exercise per Session: 0 min  Stress: No Stress Concern Present   Feeling of Stress : Not at all  Social Connections: Moderately Integrated   Frequency of Communication with Friends and Family: More than three times a week   Frequency of Social Gatherings with Friends and Family: More than three times a week   Attends Religious Services: Never   Marine scientist or Organizations: Yes   Attends Music therapist: 1 to 4 times per year   Marital Status: Married    Tobacco Counseling Counseling given: Not Answered   Clinical Intake:     Pain : No/denies pain     Nutritional Status: BMI > 30  Obese Nutritional Risks: None Diabetes: No  How often do you need to have someone help you when you read instructions, pamphlets, or other written materials from your doctor or pharmacy?: 1 - Never  Diabetic?No  Interpreter Needed?: No  Information entered by :: Caroleen Hamman LPN   Activities of Daily Living In your present state of health, do you have any difficulty performing the following activities: 03/08/2021  Hearing? N  Vision? N  Difficulty concentrating or making decisions? N  Walking or climbing stairs? N  Dressing or bathing? N  Doing  errands, shopping? N  Preparing Food and eating ? N  Using the Toilet? N  In the past six months, have you accidently leaked urine? N  Do you have problems with loss of bowel control? N  Managing your Medications? N  Managing your Finances? N  Housekeeping or managing your Housekeeping? N  Some recent data might be hidden    Patient Care Team: [name], MD as PCP - General (Family Medicine) Kris Mouton Chauncy Passy, MD as Referring Physician (Nephrology) Curt Bears, MD as Consulting Physician (Oncology) Geri Seminole, MD as Referring Physician (Dermatopathology) Marica Otter, OD as Consulting Physician (Optometry)  Indicate any recent Medical Services you may have received from other than [facility] providers in the past year (date may be approximate).     Assessment:   This is a routine wellness examination for [place].  Hearing/Vision screen Hearing Screening - Comments:: No issues Vision Screening - Comments:: Last eye exam-08/2020-Dr. Sabra Heck  Dietary issues and exercise activities discussed: Current Exercise Habits: The patient does not participate in regular exercise at present, Exercise limited by: None identified   Goals Addressed             This Visit's Progress    Eat more fruits and vegetables   On track      Depression Screen PHQ 2/9 Scores 03/08/2021 02/02/2021 03/07/2020 11/18/2017 11/16/2016 11/15/2015 08/05/2014  PHQ - 2 Score 0 0 0 0 0 0 0    Fall Risk Fall Risk  03/08/2021 02/02/2021 03/07/2020 11/18/2017 11/16/2016  Falls in the past year? 0 0 0 No No  Number falls in past yr: 0 0 0 - -  Injury with Fall? 0 0 0 - -  Follow up Falls prevention discussed - Education provided;Falls prevention discussed - -    FALL RISK PREVENTION PERTAINING TO THE HOME:  Any stairs in  or around the home? Yes  If so, are there any without handrails? No  Home free of loose throw rugs in walkways, pet beds, electrical cords, etc? Yes  Adequate lighting in your home to reduce  risk of falls? Yes   ASSISTIVE DEVICES UTILIZED TO PREVENT FALLS:  Life alert? No  Use of a cane, walker or w/c? No  Grab bars in the bathroom? No  Shower chair or bench in shower? No  Elevated toilet seat or a handicapped toilet? No   TIMED UP AND GO:  Was the test performed? No . Phone visit   Cognitive Function:Normal cognitive status assessed by  this Nurse Health Advisor. No abnormalities found.   MMSE - Mini Mental State Exam 11/18/2017 11/16/2016 11/15/2015  Orientation to time 5 5 5   Orientation to Place 5 5 5   Registration 3 3 3   Attention/ Calculation 5 5 5   Recall 3 3 3   Language- name 2 objects 2 2 2   Language- repeat 1 1 1   Language- follow 3 step command 3 3 3   Language- read & follow direction 1 1 1   Write a sentence 1 1 1   Copy design 1 1 1   Total score 30 30 30         Immunizations Immunization History  Administered Date(s) Administered   Fluad Quad(high Dose 65+) 04/21/2019, 05/13/2020   Influenza, High Dose Seasonal PF 06/06/2016, 06/07/2017, 05/19/2018   Influenza,inj,Quad PF,6+ Mos 06/17/2015   PFIZER Comirnaty(Gray Top)Covid-19 Tri-Sucrose Vaccine 02/02/2021   PFIZER(Purple Top)SARS-COV-2 Vaccination 08/11/2019, 08/31/2019, 05/13/2020   Pneumococcal Conjugate-13 12/13/2014   Pneumococcal Polysaccharide-23 02/01/2010   Td 09/27/2001   Tdap 09/04/2012    TDAP status: Up to date  Flu Vaccine status: Up to date  Pneumococcal vaccine status: Up to date  Covid-19 vaccine status: Completed vaccines  Qualifies for Shingles Vaccine? Yes   Zostavax completed No   Shingrix Completed?: No.    Education has been provided regarding the importance of this vaccine. Patient has been advised to call insurance company to determine out of pocket expense if they have not yet received this vaccine. Advised may also receive vaccine at local pharmacy or Health Dept. Verbalized acceptance and understanding.  Screening Tests Health Maintenance  Topic Date Due    INFLUENZA VACCINE  02/27/2021   Zoster Vaccines- Shingrix (1 of 2) 05/05/2022 (Originally 04/12/1965)   COVID-19 Vaccine (5 - Booster for Pfizer series) 06/05/2021   TETANUS/TDAP  09/04/2022   COLONOSCOPY (Pts 45-74yr Insurance coverage will need to be confirmed)  01/05/2025   Hepatitis C Screening  Completed   PNA vac Low Risk Adult  Completed   HPV VACCINES  Aged Out    Health Maintenance  Health Maintenance Due  Topic Date Due   INFLUENZA VACCINE  02/27/2021    Colorectal cancer screening: No longer required.   Lung Cancer Screening: (Low Dose CT Chest recommended if Age 77-80years, 30 pack-year currently smoking OR have quit w/in 15years.) does not qualify.     Additional Screening:  Hepatitis C Screening: Completed 12/18/2016  Vision Screening: Recommended annual ophthalmology exams for early detection of glaucoma and other disorders of the eye. Is the patient up to date with their annual eye exam?  Yes  Who is the provider or what is the name of the office in which the patient attends annual eye exams? Dr. MSabra Heck  Dental Screening: Recommended annual dental exams for proper oral hygiene  Community Resource Referral / Chronic Care Management: CRR required this visit?  No   CCM required this visit?  No      Plan:     I have personally reviewed and noted the following in the patient's chart:   Medical and social history Use of alcohol, tobacco or illicit drugs  Current medications and supplements including opioid prescriptions. Patient is not currently taking opioid prescriptions. Functional ability and status Nutritional status Physical activity Advanced directives List of other physicians Hospitalizations, surgeries, and ER visits in previous 12 months Vitals Screenings to include cognitive, depression, and falls Referrals and appointments  In addition, I have reviewed and discussed with patient certain preventive protocols, quality metrics, and best  practice recommendations. A written personalized care plan for preventive services as well as general preventive health recommendations were provided to patient.   Due to this being a telephonic visit, the after visit summary with patients personalized plan was offered to patient via mail or my-chart. Patient would like to access on my-chart.   Marta Antu, LPN   03/12/4817  Nurse Health Advisor  Nurse Notes: None  I have reviewed and agree with Health Coaches documentation.  Kathlene November, MD

## 2021-03-08 ENCOUNTER — Ambulatory Visit (INDEPENDENT_AMBULATORY_CARE_PROVIDER_SITE_OTHER): Payer: Medicare HMO

## 2021-03-08 VITALS — Ht 71.0 in | Wt 232.0 lb

## 2021-03-08 DIAGNOSIS — Z0000 Encounter for general adult medical examination without abnormal findings: Secondary | ICD-10-CM

## 2021-03-08 NOTE — Patient Instructions (Signed)
Kyle Long , Thank you for taking time to come for your Medicare Wellness Visit. I appreciate your ongoing commitment to your health goals. Please review the following plan we discussed and let me know if I can assist you in the future.   Screening recommendations/referrals: Colonoscopy: No longer required Recommended yearly ophthalmology/optometry visit for glaucoma screening and checkup Recommended yearly dental visit for hygiene and checkup  Vaccinations: Influenza vaccine: Up to date Pneumococcal vaccine: Up to date Tdap vaccine: Up to date-Due-09/04/2022 Shingles vaccine: Discuss with pharmacy   Covid-19: Up to date  Advanced directives: Copy in chart  Conditions/risks identified: See problem list  Next appointment: Follow up in one year for your annual wellness visit. 03/14/2022 @ 9:00  Preventive Care 77 Years and Older, Male Preventive care refers to lifestyle choices and visits with your health care provider that can promote health and wellness. What does preventive care include? A yearly physical exam. This is also called an annual well check. Dental exams once or twice a year. Routine eye exams. Ask your health care provider how often you should have your eyes checked. Personal lifestyle choices, including: Daily care of your teeth and gums. Regular physical activity. Eating a healthy diet. Avoiding tobacco and drug use. Limiting alcohol use. Practicing safe sex. Taking low doses of aspirin every day. Taking vitamin and mineral supplements as recommended by your health care provider. What happens during an annual well check? The services and screenings done by your health care provider during your annual well check will depend on your age, overall health, lifestyle risk factors, and family history of disease. Counseling  Your health care provider may ask you questions about your: Alcohol use. Tobacco use. Drug use. Emotional well-being. Home and relationship  well-being. Sexual activity. Eating habits. History of falls. Memory and ability to understand (cognition). Work and work Statistician. Screening  You may have the following tests or measurements: Height, weight, and BMI. Blood pressure. Lipid and cholesterol levels. These may be checked every 5 years, or more frequently if you are over 9 years old. Skin check. Lung cancer screening. You may have this screening every year starting at age 35 if you have a 30-pack-year history of smoking and currently smoke or have quit within the past 15 years. Fecal occult blood test (FOBT) of the stool. You may have this test every year starting at age 94. Flexible sigmoidoscopy or colonoscopy. You may have a sigmoidoscopy every 5 years or a colonoscopy every 10 years starting at age 15. Prostate cancer screening. Recommendations will vary depending on your family history and other risks. Hepatitis C blood test. Hepatitis B blood test. Sexually transmitted disease (STD) testing. Diabetes screening. This is done by checking your blood sugar (glucose) after you have not eaten for a while (fasting). You may have this done every 1-3 years. Abdominal aortic aneurysm (AAA) screening. You may need this if you are a current or former smoker. Osteoporosis. You may be screened starting at age 33 if you are at high risk. Talk with your health care provider about your test results, treatment options, and if necessary, the need for more tests. Vaccines  Your health care provider may recommend certain vaccines, such as: Influenza vaccine. This is recommended every year. Tetanus, diphtheria, and acellular pertussis (Tdap, Td) vaccine. You may need a Td booster every 10 years. Zoster vaccine. You may need this after age 55. Pneumococcal 13-valent conjugate (PCV13) vaccine. One dose is recommended after age 36. Pneumococcal polysaccharide (PPSV23) vaccine. One  dose is recommended after age 28. Talk to your health care  provider about which screenings and vaccines you need and how often you need them. This information is not intended to replace advice given to you by your health care provider. Make sure you discuss any questions you have with your health care provider. Document Released: 08/12/2015 Document Revised: 04/04/2016 Document Reviewed: 05/17/2015 Elsevier Interactive Patient Education  2017 [place] Prevention in the Home Falls can cause injuries. They can happen to people of all ages. There are many things you can do to make your home safe and to help prevent falls. What can I do on the outside of my home? Regularly fix the edges of walkways and driveways and fix any cracks. Remove anything that might make you trip as you walk through a door, such as a raised step or threshold. Trim any bushes or trees on the path to your home. Use bright outdoor lighting. Clear any walking paths of anything that might make someone trip, such as rocks or tools. Regularly check to see if handrails are loose or broken. Make sure that both sides of any steps have handrails. Any raised decks and porches should have guardrails on the edges. Have any leaves, snow, or ice cleared regularly. Use sand or salt on walking paths during winter. Clean up any spills in your garage right away. This includes oil or grease spills. What can I do in the bathroom? Use night lights. Install grab bars by the toilet and in the tub and shower. Do not use towel bars as grab bars. Use non-skid mats or decals in the tub or shower. If you need to sit down in the shower, use a plastic, non-slip stool. Keep the floor dry. Clean up any water that spills on the floor as soon as it happens. Remove soap buildup in the tub or shower regularly. Attach bath mats securely with double-sided non-slip rug tape. Do not have throw rugs and other things on the floor that can make you trip. What can I do in the bedroom? Use night lights. Make  sure that you have a light by your bed that is easy to reach. Do not use any sheets or blankets that are too big for your bed. They should not hang down onto the floor. Have a firm chair that has side arms. You can use this for support while you get dressed. Do not have throw rugs and other things on the floor that can make you trip. What can I do in the kitchen? Clean up any spills right away. Avoid walking on wet floors. Keep items that you use a lot in easy-to-reach places. If you need to reach something above you, use a strong step stool that has a grab bar. Keep electrical cords out of the way. Do not use floor polish or wax that makes floors slippery. If you must use wax, use non-skid floor wax. Do not have throw rugs and other things on the floor that can make you trip. What can I do with my stairs? Do not leave any items on the stairs. Make sure that there are handrails on both sides of the stairs and use them. Fix handrails that are broken or loose. Make sure that handrails are as long as the stairways. Check any carpeting to make sure that it is firmly attached to the stairs. Fix any carpet that is loose or worn. Avoid having throw rugs at the top or bottom of the  stairs. If you do have throw rugs, attach them to the floor with carpet tape. Make sure that you have a light switch at the top of the stairs and the bottom of the stairs. If you do not have them, ask someone to add them for you. What else can I do to help prevent falls? Wear shoes that: Do not have high heels. Have rubber bottoms. Are comfortable and fit you well. Are closed at the toe. Do not wear sandals. If you use a stepladder: Make sure that it is fully opened. Do not climb a closed stepladder. Make sure that both sides of the stepladder are locked into place. Ask someone to hold it for you, if possible. Clearly mark and make sure that you can see: Any grab bars or handrails. First and last steps. Where the  edge of each step is. Use tools that help you move around (mobility aids) if they are needed. These include: Canes. Walkers. Scooters. Crutches. Turn on the lights when you go into a dark area. Replace any light bulbs as soon as they burn out. Set up your furniture so you have a clear path. Avoid moving your furniture around. If any of your floors are uneven, fix them. If there are any pets around you, be aware of where they are. Review your medicines with your doctor. Some medicines can make you feel dizzy. This can increase your chance of falling. Ask your doctor what other things that you can do to help prevent falls. This information is not intended to replace advice given to you by your health care provider. Make sure you discuss any questions you have with your health care provider. Document Released: 05/12/2009 Document Revised: 12/22/2015 Document Reviewed: 08/20/2014 Elsevier Interactive Patient Education  2017 [employer].

## 2021-04-03 ENCOUNTER — Other Ambulatory Visit: Payer: Self-pay | Admitting: Family Medicine

## 2021-04-04 ENCOUNTER — Other Ambulatory Visit (HOSPITAL_BASED_OUTPATIENT_CLINIC_OR_DEPARTMENT_OTHER): Payer: Self-pay

## 2021-04-18 DIAGNOSIS — L570 Actinic keratosis: Secondary | ICD-10-CM | POA: Diagnosis not present

## 2021-04-18 DIAGNOSIS — L814 Other melanin hyperpigmentation: Secondary | ICD-10-CM | POA: Diagnosis not present

## 2021-04-18 DIAGNOSIS — Z85828 Personal history of other malignant neoplasm of skin: Secondary | ICD-10-CM | POA: Diagnosis not present

## 2021-04-18 DIAGNOSIS — L579 Skin changes due to chronic exposure to nonionizing radiation, unspecified: Secondary | ICD-10-CM | POA: Diagnosis not present

## 2021-04-18 DIAGNOSIS — L400 Psoriasis vulgaris: Secondary | ICD-10-CM | POA: Diagnosis not present

## 2021-04-18 DIAGNOSIS — D485 Neoplasm of uncertain behavior of skin: Secondary | ICD-10-CM | POA: Diagnosis not present

## 2021-04-18 DIAGNOSIS — L820 Inflamed seborrheic keratosis: Secondary | ICD-10-CM | POA: Diagnosis not present

## 2021-04-18 DIAGNOSIS — L821 Other seborrheic keratosis: Secondary | ICD-10-CM | POA: Diagnosis not present

## 2021-04-18 DIAGNOSIS — D045 Carcinoma in situ of skin of trunk: Secondary | ICD-10-CM | POA: Diagnosis not present

## 2021-05-05 ENCOUNTER — Other Ambulatory Visit (HOSPITAL_BASED_OUTPATIENT_CLINIC_OR_DEPARTMENT_OTHER): Payer: Self-pay

## 2021-05-05 MED ORDER — INFLUENZA VAC A&B SA ADJ QUAD 0.5 ML IM PRSY
PREFILLED_SYRINGE | INTRAMUSCULAR | 0 refills | Status: DC
  Filled 2021-05-05: qty 0.5, 1d supply, fill #0

## 2021-05-16 DIAGNOSIS — D045 Carcinoma in situ of skin of trunk: Secondary | ICD-10-CM | POA: Diagnosis not present

## 2021-06-06 ENCOUNTER — Telehealth: Payer: Self-pay | Admitting: Family Medicine

## 2021-06-06 NOTE — Telephone Encounter (Signed)
Medication: losartan (COZAAR) 100 MG tablet   Has the patient contacted their pharmacy? Yes.   (If no, request that the patient contact the pharmacy for the refill.) (If yes, when and what did the pharmacy advise?) Having issues with filling, requested new refill.  Preferred Pharmacy (with phone number or street name): [facility], [place] to Registered [address]  [address], [place] [address]  Phone:  [phone]  Fax:  [phone]   Agent: Please be advised that RX refills may take up to 3 business days. We ask that you follow-up with your pharmacy.

## 2021-06-07 ENCOUNTER — Other Ambulatory Visit: Payer: Self-pay

## 2021-06-07 MED ORDER — LOSARTAN POTASSIUM 100 MG PO TABS: ORAL_TABLET | ORAL | 1 refills | Status: DC

## 2021-06-07 NOTE — Telephone Encounter (Signed)
Medication sent.

## 2021-07-11 DIAGNOSIS — Z85828 Personal history of other malignant neoplasm of skin: Secondary | ICD-10-CM | POA: Diagnosis not present

## 2021-07-11 DIAGNOSIS — L570 Actinic keratosis: Secondary | ICD-10-CM | POA: Diagnosis not present

## 2021-07-11 DIAGNOSIS — D225 Melanocytic nevi of trunk: Secondary | ICD-10-CM | POA: Diagnosis not present

## 2021-07-11 DIAGNOSIS — L579 Skin changes due to chronic exposure to nonionizing radiation, unspecified: Secondary | ICD-10-CM | POA: Diagnosis not present

## 2021-07-11 DIAGNOSIS — L821 Other seborrheic keratosis: Secondary | ICD-10-CM | POA: Diagnosis not present

## 2021-07-11 DIAGNOSIS — L814 Other melanin hyperpigmentation: Secondary | ICD-10-CM | POA: Diagnosis not present

## 2021-07-11 DIAGNOSIS — D485 Neoplasm of uncertain behavior of skin: Secondary | ICD-10-CM | POA: Diagnosis not present

## 2021-07-11 DIAGNOSIS — L400 Psoriasis vulgaris: Secondary | ICD-10-CM | POA: Diagnosis not present

## 2021-07-19 ENCOUNTER — Ambulatory Visit: Payer: Medicare HMO | Attending: Internal Medicine

## 2021-07-19 DIAGNOSIS — Z23 Encounter for immunization: Secondary | ICD-10-CM

## 2021-07-19 NOTE — Progress Notes (Signed)
° °  Covid-19 Vaccination Clinic  Name:  Kyle Long    MRN: [id number] DOB: 1945/12/03  07/19/2021  [name] was observed post Covid-19 immunization for 15 minutes without incident. He was provided with Vaccine Information Sheet and instruction to access the V-Safe system.   [name] was instructed to call 911 with any severe reactions post vaccine: Difficulty breathing  Swelling of face and throat  A fast heartbeat  A bad rash all over body  Dizziness and weakness   Immunizations Administered     Name Date Dose VIS Date Route   Pfizer Covid-19 Vaccine Bivalent Booster 07/19/2021 12:24 PM 0.3 mL 03/29/2021 Intramuscular   Manufacturer: [place]   Lot: [id number]   [place]: [phone]

## 2021-07-20 ENCOUNTER — Other Ambulatory Visit (HOSPITAL_BASED_OUTPATIENT_CLINIC_OR_DEPARTMENT_OTHER): Payer: Self-pay

## 2021-07-20 MED ORDER — PFIZER COVID-19 VAC BIVALENT 30 MCG/0.3ML IM SUSP
INTRAMUSCULAR | 0 refills | Status: DC
  Filled 2021-07-20: qty 0.3, 1d supply, fill #0

## 2021-08-24 DIAGNOSIS — D485 Neoplasm of uncertain behavior of skin: Secondary | ICD-10-CM | POA: Diagnosis not present

## 2021-09-12 ENCOUNTER — Encounter: Payer: Medicare HMO | Admitting: Family Medicine

## 2021-09-13 ENCOUNTER — Encounter: Payer: Medicare HMO | Admitting: Family Medicine

## 2021-09-20 ENCOUNTER — Encounter: Payer: Self-pay | Admitting: Family Medicine

## 2021-09-20 ENCOUNTER — Ambulatory Visit (INDEPENDENT_AMBULATORY_CARE_PROVIDER_SITE_OTHER): Payer: Medicare HMO | Admitting: Family Medicine

## 2021-09-20 VITALS — BP 139/79 | HR 66 | Ht 71.0 in | Wt 234.8 lb

## 2021-09-20 DIAGNOSIS — Z125 Encounter for screening for malignant neoplasm of prostate: Secondary | ICD-10-CM | POA: Diagnosis not present

## 2021-09-20 DIAGNOSIS — L409 Psoriasis, unspecified: Secondary | ICD-10-CM | POA: Diagnosis not present

## 2021-09-20 DIAGNOSIS — I10 Essential (primary) hypertension: Secondary | ICD-10-CM | POA: Diagnosis not present

## 2021-09-20 DIAGNOSIS — R739 Hyperglycemia, unspecified: Secondary | ICD-10-CM | POA: Diagnosis not present

## 2021-09-20 DIAGNOSIS — E782 Mixed hyperlipidemia: Secondary | ICD-10-CM

## 2021-09-20 DIAGNOSIS — Z0000 Encounter for general adult medical examination without abnormal findings: Secondary | ICD-10-CM

## 2021-09-20 LAB — COMPREHENSIVE METABOLIC PANEL
ALT: 26 U/L (ref 0–53)
AST: 24 U/L (ref 0–37)
Albumin: 4.5 g/dL (ref 3.5–5.2)
Alkaline Phosphatase: 50 U/L (ref 39–117)
BUN: 25 mg/dL — ABNORMAL HIGH (ref 6–23)
CO2: 35 mEq/L — ABNORMAL HIGH (ref 19–32)
Calcium: 9.7 mg/dL (ref 8.4–10.5)
Chloride: 93 mEq/L — ABNORMAL LOW (ref 96–112)
Creatinine, Ser: 1.44 mg/dL (ref 0.40–1.50)
GFR: 46.89 mL/min — ABNORMAL LOW (ref >60.00)
Glucose, Bld: 126 mg/dL — ABNORMAL HIGH (ref 70–99)
Potassium: 4 mEq/L (ref 3.5–5.1)
Sodium: 134 mEq/L — ABNORMAL LOW (ref 135–145)
Total Bilirubin: 1.8 mg/dL — ABNORMAL HIGH (ref 0.2–1.2)
Total Protein: 7.1 g/dL (ref 6.0–8.3)

## 2021-09-20 LAB — PSA: PSA: 2.31 ng/mL (ref 0.10–4.00)

## 2021-09-20 LAB — LIPID PANEL
Cholesterol: 187 mg/dL (ref 0–200)
HDL: 44.7 mg/dL (ref >39.00)
NonHDL: 142.78
Total CHOL/HDL Ratio: 4
Triglycerides: 343 mg/dL — ABNORMAL HIGH (ref 0.0–149.0)
VLDL: 68.6 mg/dL — ABNORMAL HIGH (ref 0.0–40.0)

## 2021-09-20 LAB — CBC
HCT: 43.1 % (ref 39.0–52.0)
Hemoglobin: 14.6 g/dL (ref 13.0–17.0)
MCHC: 33.9 g/dL (ref 30.0–36.0)
MCV: 95.4 fl (ref 78.0–100.0)
Platelets: 213 10*3/uL (ref 150.0–400.0)
RBC: 4.51 Mil/uL (ref 4.22–5.81)
RDW: 12.6 % (ref 11.5–15.5)
WBC: 10.8 10*3/uL — ABNORMAL HIGH (ref 4.0–10.5)

## 2021-09-20 LAB — TSH: TSH: 2.67 u[IU]/mL (ref 0.35–5.50)

## 2021-09-20 LAB — LDL CHOLESTEROL, DIRECT: Direct LDL: 95 mg/dL

## 2021-09-20 LAB — HEMOGLOBIN A1C: Hgb A1c MFr Bld: 6.1 % (ref 4.6–6.5)

## 2021-09-20 NOTE — Assessment & Plan Note (Signed)
Continue heart healthy diet and regular exercise. Rechecking labs today.

## 2021-09-20 NOTE — Assessment & Plan Note (Signed)
Well controlled. Following with derm. No changes

## 2021-09-20 NOTE — Assessment & Plan Note (Signed)
Well controlled. No changes. Continue current plan, healthy diet, and regular exercise.

## 2021-09-20 NOTE — Assessment & Plan Note (Signed)
No recent symptoms. Rechecking labs today. Last A1c stable. No complaints.

## 2021-09-20 NOTE — Progress Notes (Signed)
BP 139/79    Pulse 66    Ht 5' 11"  (1.803 m)    Wt 234 lb 12.8 oz (106.5 kg)    BMI 32.75 kg/m    Subjective:    Patient ID: Kyle Long, male    DOB: 08/31/1945, 76 y.o.   MRN: [id number]  HPI: Kyle Long is a 76 y.o. male presenting on 09/20/2021 for comprehensive medical examination. Current medical complaints include:none  Psoriasis - cream seems to be helping, no major concerns, follows with dermatology HTN - taking losartan, metoprolol and chlorthalidone ; readings at home have been around 120-130/70s. Tachycardia - on cardizem, doing well, no complaints or concerns   He currently lives with: wife  Interim Problems from his last visit: no  He reports regular vision exams q1-5y: yes He reports regular dental exams q 44m yes His diet consists of: anything, lots of fruits/veggies, tries to avoid sodium He endorses exercise and/or activity of: minimal, treadmill occasionally  He works at: retired from [employer]  He endorses ETOH use - 3 servings per day  He denies nictoine use  He denies illegal substance use   He is not currently sexually active  He denies concerns today about STI  He denies concerns about skin changes today:  He denies concerns about bowel changes today:  He denies concerns about bladder changes today:   Depression Screen done today and results listed below:  [facility] 03/08/2021 02/02/2021 03/07/2020 11/18/2017 11/16/2016  Decreased Interest 0 0 0 0 0  Down, Depressed, Hopeless 0 0 0 0 0  PHQ - 2 Score 0 0 0 0 0  Some recent data might be hidden    The patient does not have a history of falls. I     Past Medical History:  Past Medical History:  Diagnosis Date   Allergic state 06/26/2015   Amnesia 2009   isolated;for 30 mins w/elevated BP   Arthritis    [place]    skin / top [place]   Cataract    bilateral repair   Cellulitis 2011   LUE (no PMH of MRSA)/left elbow   Chronic kidney disease    resolved    Diverticulosis    Diverticulosis of colon    GERD (gastroesophageal reflux disease)    OCC HEARTBURN   Gilbert's syndrome    Hematuria 2009   Dr PBurnell Blanks  Hyperglycemia    Hyperlipidemia    Hypertension    Lung cancer, lower lobe ([place]    Stage IIA (T1,N1)   [place]    Low grad   Neuromuscular disorder [place]    2010 TEMPORARY MEMORY LOSS 1-1.5 HR [place] [facility] [facility], NOTHING FOUND   Peptic ulcer 1995   Peyronie disease    Preventative health care 12/19/2015   [place] 11/27/2011   Recurrent upper respiratory infection (URI)    09/2011  BRONCHITIS   SCC (squamous cell carcinoma) 01/21/2009   Qualifier: Diagnosis of  By: HLinna DarnerMD, William   ? Basal Cell      Surgical History:  Past Surgical History:  Procedure Laterality Date   chemo treatment      4 treatments that last 7 hours each/last treatmebt in 2013   COLONOSCOPY  2015   COLONOSCOPY W/ POLYPECTOMY  2005   due 2015   CYSTOSCOPY  2009   LOBECTOMY  11/02/2011   Procedure: LOBECTOMY;  Surgeon: SRemo Lipps  Chaya Jan, MD;  Location: [facility] [place];  Service: Thoracic;  Laterality: Left;  LEFT LOWER LOBECTOMY   REFRACTIVE SURGERY     Bil   SKIN BIOPSY     4 0r 5   Squamous Cell Cancer Excision  12/13/2016   SUPRACLAVICAL NODE BIOPSY Left 10/15/2014   Procedure: LEFT SUPRACLAVICAL LYMPH NODE BIOPSY;  Surgeon: [place] Nakayama, MD;  Location: [facility];  Service: Thoracic;  Laterality: Left;  LEFT SUPRACLAVICAL LYMPH NODE BIOPSY   VASECTOMY     VEIN SURGERY     R ankle    Medications:  Current Outpatient Medications on File Prior to Visit  Medication Sig   chlorthalidone (HYGROTON) 25 MG tablet TAKE 1 TABLET DAILY   diltiazem (CARDIZEM CD) 240 MG 24 hr capsule TAKE 1 CAPSULE DAILY   losartan (COZAAR) 100 MG tablet TAKE 1/2 TABLET DAILY   metoprolol tartrate (LOPRESSOR) 25 MG tablet Take 1 tablet (25 mg total) by mouth 2 (two) times daily.   mometasone (ELOCON) 0.1 %  cream Apply 1 application topically 2 (two) times daily as needed. Dr Arnoldo Lenis dermatology [name]   No current facility-administered medications on file prior to visit.    Allergies:  Allergies  Allergen Reactions   Lisinopril Cough    Social History:  Social History   Socioeconomic History   Marital status: Married    Spouse name: Not on file   Number of children: Not on file   Years of education: Not on file   Highest education level: Not on file  Occupational History   Occupation: retired  Tobacco Use   Smoking status: Former    Packs/day: 1.00    Years: 15.00    Pack years: 15.00    Types: Cigarettes    Quit date: 07/31/1979    Years since quitting: 42.1   Smokeless tobacco: Former    Types: Nurse, children's Use: Never used  Substance and Sexual Activity   Alcohol use: Yes    Comment: Daily 3 glasses of vodka   Drug use: No   Sexual activity: Not Currently    Comment: lives with wife, retired from [employer], ran distribution. No dietary restrictions.  Other Topics Concern   Not on file  Social History Narrative   Reg exercise   Social Determinants of Health   Financial Resource Strain: Not on file  Food Insecurity: Not on file  Transportation Needs: Not on file  Physical Activity: Inactive   Days of Exercise per Week: 0 days   Minutes of Exercise per Session: 0 min  Stress: No Stress Concern Present   Feeling of Stress : Not at all  Social Connections: Moderately Integrated   Frequency of Communication with Friends and Family: More than three times a week   Frequency of Social Gatherings with Friends and Family: More than three times a week   Attends Religious Services: Never   Marine scientist or Organizations: Yes   Attends Archivist Meetings: 1 to 4 times per year   Marital Status: Married  Human resources officer Violence: Not At Risk   Fear of Current or Ex-Partner: No   Emotionally Abused: No    Physically Abused: No   Sexually Abused: No   Social History   Tobacco Use  Smoking Status Former   Packs/day: 1.00   Years: 15.00   Pack years: 15.00   Types: Cigarettes   Quit date: 07/31/1979   Years since quitting: 104.1  Smokeless Tobacco Former   Types: Chew   Social History   Substance and Sexual Activity  Alcohol Use Yes   Comment: Daily 3 glasses of vodka    Family History:  Family History  Problem Relation Age of Onset   Stroke Mother        in 78s   Hypertension Mother    Breast cancer Mother    Neuropathy Mother    Melanoma Father    Heart attack Father 47       stents   Dementia Father    Hypertension Sister    Thyroid disease Sister        ??   Multiple myeloma Sister    Cancer Sister    Stomach cancer Maternal Grandfather 16   Lung cancer Maternal Grandfather    Cancer Paternal Grandmother        lung, nonsmoker   Headache Daughter    Diabetes Paternal Aunt    Colon cancer Neg Hx    Colon polyps Neg Hx    Esophageal cancer Neg Hx    Rectal cancer Neg Hx     Past medical history, surgical history, medications, allergies, family history and social history reviewed with patient today and changes made to appropriate areas of the chart.   All ROS negative except what is listed above and in the HPI.      Objective:    BP 139/79    Pulse 66    Ht 5' 11"  (1.803 m)    Wt 234 lb 12.8 oz (106.5 kg)    BMI 32.75 kg/m   Wt Readings from Last 3 Encounters:  09/20/21 234 lb 12.8 oz (106.5 kg)  03/08/21 232 lb (105.2 kg)  02/22/21 232 lb 3.2 oz (105.3 kg)    Physical Exam Vitals reviewed.  Constitutional:      Appearance: Normal appearance. He is obese.  HENT:     Head: Normocephalic and atraumatic.     Right Ear: Tympanic membrane normal.     Left Ear: Tympanic membrane normal.     Nose: Nose normal.     Mouth/Throat:     Mouth: Mucous membranes are moist.     Pharynx: Oropharynx is clear.  Eyes:     Extraocular Movements: Extraocular  movements intact.     Conjunctiva/sclera: Conjunctivae normal.     Pupils: Pupils are equal, round, and reactive to light.  Cardiovascular:     Rate and Rhythm: Normal rate and regular rhythm.     Pulses: Normal pulses.     Heart sounds: Normal heart sounds.  Pulmonary:     Effort: Pulmonary effort is normal.     Breath sounds: Normal breath sounds.  Abdominal:     General: Abdomen is flat. Bowel sounds are normal.  Musculoskeletal:        General: Normal range of motion.     Cervical back: Normal range of motion and neck supple.  Skin:    General: Skin is warm and dry.     Capillary Refill: Capillary refill takes less than 2 seconds.  Neurological:     General: No focal deficit present.     Mental Status: He is alert and oriented to person, place, and time. Mental status is at baseline.  Psychiatric:        Mood and Affect: Mood normal.        Behavior: Behavior normal.        Thought Content: Thought content normal.  Judgment: Judgment normal.    Results for orders placed or performed in visit on 02/20/21  CMP ([facility] only)  Result Value Ref Range   Sodium 140 135 - 145 mmol/L   Potassium 3.8 3.5 - 5.1 mmol/L   Chloride 99 98 - 111 mmol/L   CO2 29 22 - 32 mmol/L   Glucose, Bld 131 (H) 70 - 99 mg/dL   BUN 25 (H) 8 - 23 mg/dL   Creatinine 1.48 (H) 0.61 - 1.24 mg/dL   Calcium 10.4 (H) 8.9 - 10.3 mg/dL   Total Protein 7.5 6.5 - 8.1 g/dL   Albumin 4.0 3.5 - 5.0 g/dL   AST 24 15 - 41 U/L   ALT 31 0 - 44 U/L   Alkaline Phosphatase 50 38 - 126 U/L   Total Bilirubin 0.9 0.3 - 1.2 mg/dL   GFR, Estimated 49 (L) >60 mL/min   Anion gap 12 5 - 15  CBC with Differential (Cancer Center Only)  Result Value Ref Range   WBC Count 10.6 (H) 4.0 - 10.5 K/uL   RBC 4.48 4.22 - 5.81 MIL/uL   Hemoglobin 14.6 13.0 - 17.0 g/dL   HCT 42.1 39.0 - 52.0 %   MCV 94.0 80.0 - 100.0 fL   MCH 32.6 26.0 - 34.0 pg   MCHC 34.7 30.0 - 36.0 g/dL   RDW 12.2 11.5 - 15.5 %   Platelet Count  207 150 - 400 K/uL   nRBC 0.0 0.0 - 0.2 %   Neutrophils Relative % 48 %   Neutro Abs 5.1 1.7 - 7.7 K/uL   Lymphocytes Relative 42 %   Lymphs Abs 4.5 (H) 0.7 - 4.0 K/uL   Monocytes Relative 7 %   Monocytes Absolute 0.7 0.1 - 1.0 K/uL   Eosinophils Relative 1 %   Eosinophils Absolute 0.2 0.0 - 0.5 K/uL   Basophils Relative 1 %   Basophils Absolute 0.1 0.0 - 0.1 K/uL   Immature Granulocytes 1 %   Abs Immature Granulocytes 0.06 0.00 - 0.07 K/uL      Assessment & Plan:   Problem List Items Addressed This Visit       Cardiovascular and Mediastinum   Essential hypertension    Well controlled. No changes. Continue current plan, healthy diet, and regular exercise.       Relevant Orders   Comprehensive metabolic panel   Lipid panel   TSH     Musculoskeletal and Integument   Psoriasis    Well controlled. Following with derm. No changes        Other   Hyperlipidemia, mixed    Continue heart healthy diet and regular exercise. Rechecking labs today.       Relevant Orders   Comprehensive metabolic panel   Lipid panel   Hyperglycemia    No recent symptoms. Rechecking labs today. Last A1c stable. No complaints.       Relevant Orders   Hemoglobin A1c   Other Visit Diagnoses     Annual physical exam    -  Primary   Relevant Orders   CBC   Comprehensive metabolic panel   Lipid panel   TSH   Hemoglobin A1c   PSA   Screening for prostate cancer       Relevant Orders   PSA         LABORATORY TESTING:  Health maintenance labs ordered today as discussed above.  - STI testing: deferred  The natural history of prostate cancer and ongoing  controversy regarding screening and potential treatment outcomes of prostate cancer has been discussed with the patient. The meaning of a false positive PSA and a false negative PSA has been discussed. He indicates understanding of the limitations of this screening test and wishes  to proceed with screening PSA testing. Educated that  not necessary at his age, but would like to check anyway.   IMMUNIZATIONS:   - Tdap: Tetanus vaccination status reviewed: last tetanus booster within 10 years. - Influenza: Up to date - Pneumovax: Up to date - Prevnar: Up to date - HPV: Not applicable - Shingrix vaccine: Given elsewhere - COVID-19: Up to date  SCREENING: - Colonoscopy: Not applicable  Discussed with patient purpose of the colonoscopy is to detect colon cancer at curable precancerous or early stages  - AAA Screening: Not applicable  -Hearing Test: Not applicable  -Spirometry: Not applicable  - Lung Cancer Screening: Not applicable    PATIENT COUNSELING:    Sexuality: Discussed sexually transmitted diseases, partner selection, use of condoms, avoidance of unintended pregnancy, and contraceptive alternatives.   I discussed with the patient that most people either abstain from alcohol or drink within safe limits (<=14/week and <=4 drinks/occasion for males, <=7/weeks and <= 3 drinks/occasion for females) and that the risk for alcohol disorders and other health effects rises proportionally with the number of drinks per week and how often a drinker exceeds daily limits.  Discussed cessation/primary prevention of drug use and availability of treatment for abuse.   Diet: Encouraged to adjust caloric intake to maintain or achieve ideal body weight, to reduce intake of dietary saturated fat and total fat, to limit sodium intake by avoiding high sodium foods and not adding table salt, and to maintain adequate dietary potassium and calcium preferably from fresh fruits, vegetables, and low-fat dairy products.    Emphasized the importance of regular exercise  Injury prevention: Discussed safety belts, safety helmets, smoke detector, smoking near bedding or upholstery.   Dental health: Discussed importance of regular tooth brushing, flossing, and dental visits.   Follow up plan:  Return in about 6 months (around 03/20/2022) for  routine follow-up.   Purcell Nails Olevia Bowens, DNP, FNP-C

## 2021-09-30 ENCOUNTER — Other Ambulatory Visit: Payer: Self-pay | Admitting: Family Medicine

## 2021-10-02 ENCOUNTER — Other Ambulatory Visit: Payer: Self-pay

## 2021-10-02 DIAGNOSIS — I10 Essential (primary) hypertension: Secondary | ICD-10-CM

## 2021-10-02 MED ORDER — CHLORTHALIDONE 25 MG PO TABS: 25.0000 mg | ORAL_TABLET | Freq: Every day | ORAL | 1 refills | Status: DC

## 2021-10-17 ENCOUNTER — Other Ambulatory Visit: Payer: Self-pay | Admitting: Family Medicine

## 2021-10-17 DIAGNOSIS — H43819 Vitreous degeneration, unspecified eye: Secondary | ICD-10-CM | POA: Diagnosis not present

## 2021-10-17 DIAGNOSIS — H5211 Myopia, right eye: Secondary | ICD-10-CM | POA: Diagnosis not present

## 2021-10-17 DIAGNOSIS — H43812 Vitreous degeneration, left eye: Secondary | ICD-10-CM | POA: Diagnosis not present

## 2021-10-17 DIAGNOSIS — H524 Presbyopia: Secondary | ICD-10-CM | POA: Diagnosis not present

## 2021-10-17 DIAGNOSIS — H43399 Other vitreous opacities, unspecified eye: Secondary | ICD-10-CM | POA: Diagnosis not present

## 2021-10-17 DIAGNOSIS — H52223 Regular astigmatism, bilateral: Secondary | ICD-10-CM | POA: Diagnosis not present

## 2021-11-24 ENCOUNTER — Other Ambulatory Visit: Payer: Self-pay | Admitting: Family Medicine

## 2021-12-11 DIAGNOSIS — H5211 Myopia, right eye: Secondary | ICD-10-CM | POA: Diagnosis not present

## 2021-12-12 DIAGNOSIS — H43392 Other vitreous opacities, left eye: Secondary | ICD-10-CM | POA: Diagnosis not present

## 2021-12-12 DIAGNOSIS — H3581 Retinal edema: Secondary | ICD-10-CM | POA: Diagnosis not present

## 2021-12-12 DIAGNOSIS — H43812 Vitreous degeneration, left eye: Secondary | ICD-10-CM | POA: Diagnosis not present

## 2021-12-12 DIAGNOSIS — H33011 Retinal detachment with single break, right eye: Secondary | ICD-10-CM | POA: Diagnosis not present

## 2021-12-14 DIAGNOSIS — H33011 Retinal detachment with single break, right eye: Secondary | ICD-10-CM | POA: Diagnosis not present

## 2021-12-22 DIAGNOSIS — H43812 Vitreous degeneration, left eye: Secondary | ICD-10-CM | POA: Diagnosis not present

## 2021-12-22 DIAGNOSIS — H31091 Other chorioretinal scars, right eye: Secondary | ICD-10-CM | POA: Diagnosis not present

## 2021-12-22 DIAGNOSIS — H33011 Retinal detachment with single break, right eye: Secondary | ICD-10-CM | POA: Diagnosis not present

## 2022-01-09 DIAGNOSIS — L565 Disseminated superficial actinic porokeratosis (DSAP): Secondary | ICD-10-CM | POA: Diagnosis not present

## 2022-01-09 DIAGNOSIS — Z85828 Personal history of other malignant neoplasm of skin: Secondary | ICD-10-CM | POA: Diagnosis not present

## 2022-01-09 DIAGNOSIS — L400 Psoriasis vulgaris: Secondary | ICD-10-CM | POA: Diagnosis not present

## 2022-01-09 DIAGNOSIS — L570 Actinic keratosis: Secondary | ICD-10-CM | POA: Diagnosis not present

## 2022-01-09 DIAGNOSIS — L814 Other melanin hyperpigmentation: Secondary | ICD-10-CM | POA: Diagnosis not present

## 2022-01-09 DIAGNOSIS — L579 Skin changes due to chronic exposure to nonionizing radiation, unspecified: Secondary | ICD-10-CM | POA: Diagnosis not present

## 2022-01-09 DIAGNOSIS — L821 Other seborrheic keratosis: Secondary | ICD-10-CM | POA: Diagnosis not present

## 2022-02-16 ENCOUNTER — Ambulatory Visit (HOSPITAL_COMMUNITY)
Admission: RE | Admit: 2022-02-16 | Discharge: 2022-02-16 | Disposition: A | Discharge status: Home / Self Care | Payer: Medicare HMO | Source: Ambulatory Visit | Attending: Internal Medicine | Admitting: Internal Medicine

## 2022-02-16 ENCOUNTER — Other Ambulatory Visit: Payer: Self-pay

## 2022-02-16 ENCOUNTER — Inpatient Hospital Stay: Payer: Medicare HMO | Attending: Internal Medicine

## 2022-02-16 DIAGNOSIS — K760 Fatty (change of) liver, not elsewhere classified: Secondary | ICD-10-CM | POA: Diagnosis not present

## 2022-02-16 DIAGNOSIS — K4090 Unilateral inguinal hernia, without obstruction or gangrene, not specified as recurrent: Secondary | ICD-10-CM | POA: Diagnosis not present

## 2022-02-16 DIAGNOSIS — C8590 Non-Hodgkin lymphoma, unspecified, unspecified site: Secondary | ICD-10-CM | POA: Diagnosis not present

## 2022-02-16 DIAGNOSIS — C3490 Malignant neoplasm of unspecified part of unspecified bronchus or lung: Secondary | ICD-10-CM | POA: Diagnosis not present

## 2022-02-16 DIAGNOSIS — C8300 Small cell B-cell lymphoma, unspecified site: Secondary | ICD-10-CM | POA: Insufficient documentation

## 2022-02-16 DIAGNOSIS — C3432 Malignant neoplasm of lower lobe, left bronchus or lung: Secondary | ICD-10-CM | POA: Insufficient documentation

## 2022-02-16 DIAGNOSIS — R911 Solitary pulmonary nodule: Secondary | ICD-10-CM | POA: Diagnosis not present

## 2022-02-16 LAB — CBC WITH DIFFERENTIAL (CANCER CENTER ONLY)
Abs Immature Granulocytes: 0.07 10*3/uL (ref 0.00–0.07)
Basophils Absolute: 0.1 10*3/uL (ref 0.0–0.1)
Basophils Relative: 1 %
Eosinophils Absolute: 0.1 10*3/uL (ref 0.0–0.5)
Eosinophils Relative: 1 %
HCT: 41.8 % (ref 39.0–52.0)
Hemoglobin: 14.8 g/dL (ref 13.0–17.0)
Immature Granulocytes: 1 %
Lymphocytes Relative: 31 %
Lymphs Abs: 4.2 10*3/uL — ABNORMAL HIGH (ref 0.7–4.0)
MCH: 33 pg (ref 26.0–34.0)
MCHC: 35.4 g/dL (ref 30.0–36.0)
MCV: 93.3 fL (ref 80.0–100.0)
Monocytes Absolute: 1.2 10*3/uL — ABNORMAL HIGH (ref 0.1–1.0)
Monocytes Relative: 9 %
Neutro Abs: 7.7 10*3/uL (ref 1.7–7.7)
Neutrophils Relative %: 57 %
Platelet Count: 215 10*3/uL (ref 150–400)
RBC: 4.48 MIL/uL (ref 4.22–5.81)
RDW: 12.9 % (ref 11.5–15.5)
WBC Count: 13.3 10*3/uL — ABNORMAL HIGH (ref 4.0–10.5)
nRBC: 0 % (ref 0.0–0.2)

## 2022-02-16 LAB — CMP (CANCER CENTER ONLY)
ALT: 18 U/L (ref 0–44)
AST: 18 U/L (ref 15–41)
Albumin: 4.3 g/dL (ref 3.5–5.0)
Alkaline Phosphatase: 52 U/L (ref 38–126)
Anion gap: 9 (ref 5–15)
BUN: 24 mg/dL — ABNORMAL HIGH (ref 8–23)
CO2: 31 mmol/L (ref 22–32)
Calcium: 10.2 mg/dL (ref 8.9–10.3)
Chloride: 95 mmol/L — ABNORMAL LOW (ref 98–111)
Creatinine: 1.47 mg/dL — ABNORMAL HIGH (ref 0.61–1.24)
GFR, Estimated: 49 mL/min — ABNORMAL LOW (ref >60)
Glucose, Bld: 146 mg/dL — ABNORMAL HIGH (ref 70–99)
Potassium: 3.3 mmol/L — ABNORMAL LOW (ref 3.5–5.1)
Sodium: 135 mmol/L (ref 135–145)
Total Bilirubin: 1.2 mg/dL (ref 0.3–1.2)
Total Protein: 7.3 g/dL (ref 6.5–8.1)

## 2022-02-19 ENCOUNTER — Other Ambulatory Visit: Payer: Self-pay

## 2022-02-19 ENCOUNTER — Inpatient Hospital Stay (HOSPITAL_BASED_OUTPATIENT_CLINIC_OR_DEPARTMENT_OTHER): Payer: Medicare HMO | Admitting: Internal Medicine

## 2022-02-19 VITALS — BP 157/70 | HR 79 | Temp 98.3°F | Resp 18 | Ht 71.0 in | Wt 233.6 lb

## 2022-02-19 DIAGNOSIS — C3432 Malignant neoplasm of lower lobe, left bronchus or lung: Secondary | ICD-10-CM

## 2022-02-19 DIAGNOSIS — C8300 Small cell B-cell lymphoma, unspecified site: Secondary | ICD-10-CM

## 2022-02-19 DIAGNOSIS — C3490 Malignant neoplasm of unspecified part of unspecified bronchus or lung: Secondary | ICD-10-CM | POA: Diagnosis not present

## 2022-02-19 NOTE — Progress Notes (Signed)
[place] [phone]   [phone]  OFFICE PROGRESS NOTE  [name], MD [place] [address]  DIAGNOSIS AND STAGE:  1) small lymphocytic lymphoma involving the left supraclavicular lymph nodes diagnosed in February 2016. 2) Stage IIA (T1a, N1, M0) non-small cell lung cancer consistent with adenocarcinoma with negative EGFR mutation diagnosed in March of 2013.    PRIOR THERAPY:   1) S/P left video-assisted thoracoscopy, wedge resection of left lower lobe nodule, thoracoscopic left lower lobectomy, mediastinal lymph node dissection on 11/02/2011.   2) Adjuvant chemotherapy with cisplatin 75 mg/M2 and Alimta 500 mg/M2 every 3 weeks. She is status post 4 cycles.    CURRENT THERAPY: Observation.  INTERVAL HISTORY: Kyle Long 76 y.o. male returns to the clinic today for follow-up visit accompanied by his wife.  The patient is feeling fine today with no concerning complaints except for mild fatigue.  He denied having any significant night sweats or weight loss.  He has no nausea, vomiting, diarrhea or constipation.  He has no headache or visual changes.  He has no chest pain, shortness of breath, cough or hemoptysis.  He has no headache or visual changes.  The patient has repeat CT scan of the chest, abdomen and pelvis performed recently and he is here for evaluation and discussion of his scan results.  MEDICAL HISTORY: Past Medical History:  Diagnosis Date   Allergic state 06/26/2015   Amnesia 2009   isolated;for 30 mins w/elevated BP   Arthritis    Cancer ([place])    skin / top head   Cataract    bilateral repair   Cellulitis 2011   LUE (no PMH of MRSA)/left elbow   Chronic kidney disease    resolved   Diverticulosis    Diverticulosis of colon    GERD (gastroesophageal reflux disease)    OCC HEARTBURN   Gilbert's syndrome    Hematuria 2009   Dr Burnell Blanks   Hyperglycemia    Hyperlipidemia     Hypertension    Lung cancer, lower lobe (HCC)    Stage IIA (T1,N1)   Lymphoma ([place])    Low grad   Neuromuscular disorder ([place])    2010 TEMPORARY MEMORY LOSS 1-1.5 HR WAS CHECKED OUT AT [facility] , NOTHING FOUND   Peptic ulcer 1995   Peyronie disease    Preventative health care 12/19/2015   Primary cancer of left lower lobe of lung ([place]) 11/27/2011   Recurrent upper respiratory infection (URI)    09/2011  BRONCHITIS   SCC (squamous cell carcinoma) 01/21/2009   Qualifier: Diagnosis of  By: Linna Darner MD, William   ? Basal Cell      ALLERGIES:  is allergic to lisinopril.  MEDICATIONS:  Current Outpatient Medications  Medication Sig Dispense Refill   chlorthalidone (HYGROTON) 25 MG tablet Take 1 tablet (25 mg total) by mouth daily. 90 tablet 1   diltiazem (CARDIZEM CD) 240 MG 24 hr capsule TAKE 1 CAPSULE DAILY 90 capsule 1   losartan (COZAAR) 100 MG tablet TAKE 1/2 TABLET DAILY 45 tablet 1   metoprolol tartrate (LOPRESSOR) 25 MG tablet TAKE 1 TABLET TWICE A DAY 180 tablet 2   mometasone (ELOCON) 0.1 % cream Apply 1 application topically 2 (two) times daily as needed. Dr Arnoldo Lenis dermatology [name]     No current facility-administered medications for this visit.    SURGICAL HISTORY:  Past Surgical History:  Procedure Laterality Date  chemo treatment      4 treatments that last 7 hours each/last treatmebt in 2013   COLONOSCOPY  2015   COLONOSCOPY W/ POLYPECTOMY  2005   due 2015   CYSTOSCOPY  2009   LOBECTOMY  11/02/2011   Procedure: LOBECTOMY;  Surgeon: Melrose Nakayama, MD;  Location: [facility];  Service: Thoracic;  Laterality: Left;  LEFT LOWER LOBECTOMY   REFRACTIVE SURGERY     Bil   SKIN BIOPSY     4 0r 5   Squamous Cell Cancer Excision  12/13/2016   SUPRACLAVICAL NODE BIOPSY Left 10/15/2014   Procedure: LEFT SUPRACLAVICAL LYMPH NODE BIOPSY;  Surgeon: Melrose Nakayama, MD;  Location: [facility];  Service: Thoracic;  Laterality: Left;  LEFT SUPRACLAVICAL LYMPH  NODE BIOPSY   VASECTOMY     VEIN SURGERY     R ankle    REVIEW OF SYSTEMS:  A comprehensive review of systems was negative except for: Constitutional: positive for fatigue   PHYSICAL EXAMINATION: General appearance: alert, cooperative and no distress Head: Normocephalic, without obvious abnormality, atraumatic Neck: no adenopathy, no JVD, supple, symmetrical, trachea midline and thyroid not enlarged, symmetric, no tenderness/mass/nodules Lymph nodes: Cervical, supraclavicular, and axillary nodes normal. Resp: clear to auscultation bilaterally Back: symmetric, no curvature. ROM normal. No CVA tenderness. Cardio: regular rate and rhythm, S1, S2 normal, no murmur, click, rub or gallop GI: soft, non-tender; bowel sounds normal; no masses,  no organomegaly Extremities: extremities normal, atraumatic, no cyanosis or edema  ECOG PERFORMANCE STATUS: 1 - Symptomatic but completely ambulatory  Blood pressure (!) 157/70, pulse 79, temperature 98.3 F (36.8 C), temperature source Oral, resp. rate 18, height 5' 11"  (1.803 m), weight 233 lb 9.6 oz (106 kg), SpO2 99 %.  LABORATORY DATA: Lab Results  Component Value Date   WBC 13.3 (H) 02/16/2022   HGB 14.8 02/16/2022   HCT 41.8 02/16/2022   MCV 93.3 02/16/2022   PLT 215 02/16/2022      Chemistry      Component Value Date/Time   NA 135 02/16/2022 1102   NA 137 02/15/2017 1053   K 3.3 (L) 02/16/2022 1102   K 4.4 02/15/2017 1053   CL 95 (L) 02/16/2022 1102   CL 104 09/03/2012 1003   CO2 31 02/16/2022 1102   CO2 25 02/15/2017 1053   BUN 24 (H) 02/16/2022 1102   BUN 27.1 (H) 02/15/2017 1053   CREATININE 1.47 (H) 02/16/2022 1102   CREATININE 1.4 (H) 02/15/2017 1053      Component Value Date/Time   CALCIUM 10.2 02/16/2022 1102   CALCIUM 10.0 02/15/2017 1053   ALKPHOS 52 02/16/2022 1102   ALKPHOS 51 02/15/2017 1053   AST 18 02/16/2022 1102   AST 30 02/15/2017 1053   ALT 18 02/16/2022 1102   ALT 37 02/15/2017 1053   BILITOT 1.2  02/16/2022 1102   BILITOT 0.93 02/15/2017 1053       RADIOGRAPHIC STUDIES: CT Chest Wo Contrast  Result Date: 02/18/2022 CLINICAL DATA:  Non-small cell lung cancer restaging, low-grade lymphoma, status post left lower lobectomy, chemotherapy complete * Tracking Code: BO * EXAM: CT CHEST, ABDOMEN AND PELVIS WITHOUT CONTRAST TECHNIQUE: Multidetector CT imaging of the chest, abdomen and pelvis was performed following the standard protocol without IV contrast. RADIATION DOSE REDUCTION: This exam was performed according to the departmental dose-optimization program which includes automated exposure control, adjustment of the mA and/or kV according to patient size and/or use of iterative reconstruction technique. COMPARISON:  CT chest abdomen pelvis,  02/20/2021 FINDINGS: CT CHEST FINDINGS Cardiovascular: Aortic atherosclerosis. Normal heart size. Three-vessel coronary artery calcifications. No pericardial effusion. Mediastinum/Nodes: Prominent left axillary and subpectoral lymph nodes, as well as low paraesophageal lymph nodes are slightly increased in size compared to prior examination dated 02/20/2021, however more appreciably enlarged in comparison to more remote prior examinations. Index left subpectoral node measures 1.8 x 1.1 cm, previously 1.0 x 0.7 cm (series 2, image 13). Thyroid gland, trachea, and esophagus demonstrate no significant findings. Lungs/Pleura: Status post left lower lobectomy. Mild, diffuse bilateral bronchial wall thickening. Subsolid nodule of the anterior right upper lobe measures 1.2 x 0.7 cm, and although minimally changed compared to prior examination dated 02/20/2021 has very slightly enlarged over a longer period of time dating back to 2017 (series 4, image 66). No pleural effusion or pneumothorax. Musculoskeletal: No chest wall mass or suspicious osseous lesions identified. CT ABDOMEN PELVIS FINDINGS Hepatobiliary: No solid liver abnormality is seen. Hepatic steatosis. Multiple  low-attenuation liver lesions are unchanged, incompletely characterized by noncontrast CT although most likely simple cysts or hemangiomata. No gallstones, gallbladder wall thickening, or biliary dilatation. Pancreas: Unremarkable. No pancreatic ductal dilatation or surrounding inflammatory changes. Spleen: Normal in size without significant abnormality. Adrenals/Urinary Tract: Adrenal glands are unremarkable. Kidneys are normal, without renal calculi, solid lesion, or hydronephrosis. Bladder is unremarkable. Stomach/Bowel: Small diverticulum of the gastric fundus (series 2, image 54). Appendix appears normal. No evidence of bowel wall thickening, distention, or inflammatory changes. Pancolonic diverticulosis. Vascular/Lymphatic: Aortic atherosclerosis. Interval increase in size of prominent retroperitoneal and mesenteric lymph nodes, largest index node in the central small bowel mesentery measures 2.0 x 1.4 cm, previously 1.4 x 0.8 cm (series 2, image 83). Interval increase in size of bilateral iliac and inguinal lymph nodes, largest left inguinal node measuring 2.7 x 1.5 cm, previously 1.7 x 0.8 cm (series 2, image 116). Reproductive: Prostatomegaly. Other: Small, fat containing bilateral inguinal hernias. Small, fat containing umbilical hernia (series 2, image 95). No ascites. Musculoskeletal: No acute osseous findings. IMPRESSION: 1. Status post left lower lobectomy. 2. Subsolid pulmonary nodule of the anterior right upper lobe measures 1.2 x 0.7 cm, and although minimally changed compared to prior examination dated 02/20/2021 has very slightly enlarged over a longer period of time dating back to 2017. This remains concerning for indolent adenocarcinoma. 3. Numerous small lymph nodes throughout the chest, abdomen, and pelvis are enlarged compared to prior examination, consistent with worsened lymphoma. 4. Hepatic steatosis. 5. Prostatomegaly. 6. Coronary artery disease. Aortic Atherosclerosis (ICD10-I70.0).  Electronically Signed   By: Delanna Ahmadi M.D.   On: 02/18/2022 13:36   CT Abdomen Pelvis Wo Contrast  Result Date: 02/18/2022 CLINICAL DATA:  Non-small cell lung cancer restaging, low-grade lymphoma, status post left lower lobectomy, chemotherapy complete * Tracking Code: BO * EXAM: CT CHEST, ABDOMEN AND PELVIS WITHOUT CONTRAST TECHNIQUE: Multidetector CT imaging of the chest, abdomen and pelvis was performed following the standard protocol without IV contrast. RADIATION DOSE REDUCTION: This exam was performed according to the departmental dose-optimization program which includes automated exposure control, adjustment of the mA and/or kV according to patient size and/or use of iterative reconstruction technique. COMPARISON:  CT chest abdomen pelvis, 02/20/2021 FINDINGS: CT CHEST FINDINGS Cardiovascular: Aortic atherosclerosis. Normal heart size. Three-vessel coronary artery calcifications. No pericardial effusion. Mediastinum/Nodes: Prominent left axillary and subpectoral lymph nodes, as well as low paraesophageal lymph nodes are slightly increased in size compared to prior examination dated 02/20/2021, however more appreciably enlarged in comparison to more remote prior examinations. Index left  subpectoral node measures 1.8 x 1.1 cm, previously 1.0 x 0.7 cm (series 2, image 13). Thyroid gland, trachea, and esophagus demonstrate no significant findings. Lungs/Pleura: Status post left lower lobectomy. Mild, diffuse bilateral bronchial wall thickening. Subsolid nodule of the anterior right upper lobe measures 1.2 x 0.7 cm, and although minimally changed compared to prior examination dated 02/20/2021 has very slightly enlarged over a longer period of time dating back to 2017 (series 4, image 66). No pleural effusion or pneumothorax. Musculoskeletal: No chest wall mass or suspicious osseous lesions identified. CT ABDOMEN PELVIS FINDINGS Hepatobiliary: No solid liver abnormality is seen. Hepatic steatosis. Multiple  low-attenuation liver lesions are unchanged, incompletely characterized by noncontrast CT although most likely simple cysts or hemangiomata. No gallstones, gallbladder wall thickening, or biliary dilatation. Pancreas: Unremarkable. No pancreatic ductal dilatation or surrounding inflammatory changes. Spleen: Normal in size without significant abnormality. Adrenals/Urinary Tract: Adrenal glands are unremarkable. Kidneys are normal, without renal calculi, solid lesion, or hydronephrosis. Bladder is unremarkable. Stomach/Bowel: Small diverticulum of the gastric fundus (series 2, image 54). Appendix appears normal. No evidence of bowel wall thickening, distention, or inflammatory changes. Pancolonic diverticulosis. Vascular/Lymphatic: Aortic atherosclerosis. Interval increase in size of prominent retroperitoneal and mesenteric lymph nodes, largest index node in the central small bowel mesentery measures 2.0 x 1.4 cm, previously 1.4 x 0.8 cm (series 2, image 83). Interval increase in size of bilateral iliac and inguinal lymph nodes, largest left inguinal node measuring 2.7 x 1.5 cm, previously 1.7 x 0.8 cm (series 2, image 116). Reproductive: Prostatomegaly. Other: Small, fat containing bilateral inguinal hernias. Small, fat containing umbilical hernia (series 2, image 95). No ascites. Musculoskeletal: No acute osseous findings. IMPRESSION: 1. Status post left lower lobectomy. 2. Subsolid pulmonary nodule of the anterior right upper lobe measures 1.2 x 0.7 cm, and although minimally changed compared to prior examination dated 02/20/2021 has very slightly enlarged over a longer period of time dating back to 2017. This remains concerning for indolent adenocarcinoma. 3. Numerous small lymph nodes throughout the chest, abdomen, and pelvis are enlarged compared to prior examination, consistent with worsened lymphoma. 4. Hepatic steatosis. 5. Prostatomegaly. 6. Coronary artery disease. Aortic Atherosclerosis (ICD10-I70.0).  Electronically Signed   By: Delanna Ahmadi M.D.   On: 02/18/2022 13:36     ASSESSMENT AND PLAN: This is a very pleasant 76 years old white male with: 1) stage IIA non-small cell lung cancer, adenocarcinoma status post wedge resection of the left lower lobe with lymph node dissection in April 2013 followed by adjuvant systemic chemotherapy with cisplatin and Alimta.   2) small lymphocytic lymphoma: He will continue on observation for now. He has been in observation and feeling fine with no concerning complaints. He had repeat CT scan of the chest, abdomen and pelvis performed recently.  I personally and independently reviewed the scan images and discussed the result with the patient and his wife today. His scan showed very mild increase in the anterior right upper lobe nodule as well as slight increase in the lymphadenopathy consistent with the low-grade lymphoma. I discussed with the patient the option of proceeding with a PET scan for further evaluation of his disease versus continuous observation and monitoring. The patient is currently asymptomatic and he would like to continue on observation for now but he was advised to call immediately if he has any concerning symptoms in the interval. I will see him back for follow-up visit in 1 year with repeat CT scan of the chest, abdomen and pelvis for restaging of  his disease. The patient voices understanding of current disease status and treatment options and is in agreement with the current care plan. All questions were answered. The patient knows to call the clinic with any problems, questions or concerns. We can certainly see the patient much sooner if necessary.  Disclaimer: This note was dictated with voice recognition software. Similar sounding words can inadvertently be transcribed and may not be corrected upon review.

## 2022-03-13 NOTE — Progress Notes (Unsigned)
Subjective:   Kyle Long is a 76 y.o. male who presents for Medicare Annual/Subsequent preventive examination.  Review of Systems           Objective:    There were no vitals filed for this visit. There is no height or weight on file to calculate BMI.     03/08/2021    9:01 AM 08/26/2020    4:36 PM 03/07/2020    1:56 PM 02/24/2020   10:49 AM 02/26/2018   10:25 AM 11/18/2017   11:17 AM 02/18/2017   10:41 AM  Advanced Directives  Does Patient Have a Medical Advance Directive? _0  Yes Yes  Type of Paramedic of [name];Living will Living will [name];Living will Living will;Healthcare Power of [place];Living will [name];Living will [place];Living will  Does patient want to make changes to medical advance directive?  No - Patient declined No - Patient declined   No - Patient declined   Copy of [place] in Chart? Yes - validated most recent copy scanned in chart (See row information)  Yes - validated most recent copy scanned in chart (See row information)  No - copy requested Yes No - copy requested    Current Medications (verified) Outpatient Encounter Medications as of 03/14/2022  Medication Sig   chlorthalidone (HYGROTON) 25 MG tablet Take 1 tablet (25 mg total) by mouth daily.   diltiazem (CARDIZEM CD) 240 MG 24 hr capsule TAKE 1 CAPSULE DAILY   losartan (COZAAR) 100 MG tablet TAKE 1/2 TABLET DAILY   metoprolol tartrate (LOPRESSOR) 25 MG tablet TAKE 1 TABLET TWICE A DAY   mometasone (ELOCON) 0.1 % cream Apply 1 application topically 2 (two) times daily as needed. Kyle Kyle Long dermatology Kyle Long   No facility-administered encounter medications on file as of 03/14/2022.    Allergies (verified) Lisinopril   History: Past Medical History:  Diagnosis Date   Allergic state 06/26/2015   Amnesia 2009    isolated;for 30 mins w/elevated BP   Arthritis    Cancer ([place])    skin / top head   Cataract    bilateral repair   Cellulitis 2011   LUE (no PMH of MRSA)/left elbow   Chronic kidney disease    resolved   Diverticulosis    Diverticulosis of colon    GERD (gastroesophageal reflux disease)    OCC HEARTBURN   Gilbert's syndrome    Hematuria 2009   Kyle Long   Hyperglycemia    Hyperlipidemia    Hypertension    Lung cancer, lower lobe (HCC)    Stage IIA (T1,N1)   Lymphoma ([place])    Low grad   Neuromuscular disorder ([place])    2010 TEMPORARY MEMORY LOSS 1-1.5 HR WAS CHECKED OUT AT [place] , NOTHING FOUND   Peptic ulcer 1995   Peyronie disease    Preventative health care 12/19/2015   Primary cancer of left lower lobe of lung ([place]) 11/27/2011   Recurrent upper respiratory infection (URI)    09/2011  BRONCHITIS   SCC (squamous cell carcinoma) 01/21/2009   Qualifier: Diagnosis of  By: Kyle Long, Kyle Long   ? Basal Cell     Past Surgical History:  Procedure Laterality Date   chemo treatment      4 treatments that last 7 hours each/last treatmebt in 2013   COLONOSCOPY  2015   COLONOSCOPY W/ POLYPECTOMY  2005  due 2015   CYSTOSCOPY  2009   LOBECTOMY  11/02/2011   Procedure: LOBECTOMY;  Surgeon: Melrose Nakayama, Long;  Location: [name];  Service: Thoracic;  Laterality: Left;  LEFT LOWER LOBECTOMY   REFRACTIVE SURGERY     Bil   SKIN BIOPSY     4 0r 5   Squamous Cell Cancer Excision  12/13/2016   SUPRACLAVICAL NODE BIOPSY Left 10/15/2014   Procedure: LEFT SUPRACLAVICAL LYMPH NODE BIOPSY;  Surgeon: Melrose Nakayama, Long;  Location: [facility];  Service: Thoracic;  Laterality: Left;  LEFT SUPRACLAVICAL LYMPH NODE BIOPSY   VASECTOMY     VEIN SURGERY     R ankle   Family History  Problem Relation Age of Onset   Stroke Mother        in 6s   Hypertension Mother    Breast cancer Mother    Neuropathy Mother    Melanoma Father    Heart attack Father 33       stents   Dementia  Father    Hypertension Sister    Thyroid disease Sister        ??   Multiple myeloma Sister    Cancer Sister    Stomach cancer Maternal Grandfather 33   Lung cancer Maternal Grandfather    Cancer Paternal Grandmother        lung, nonsmoker   Headache Daughter    Diabetes Paternal Aunt    Colon cancer Neg Hx    Colon polyps Neg Hx    Esophageal cancer Neg Hx    Rectal cancer Neg Hx    Social History   Socioeconomic History   Marital status: Married    Spouse name: Not on file   Number of children: Not on file   Years of education: Not on file   Highest education level: Not on file  Occupational History   Occupation: retired  Tobacco Use   Smoking status: Former    Packs/day: 1.00    Years: 15.00    Total pack years: 15.00    Types: Cigarettes    Quit date: 07/31/1979    Years since quitting: 42.6   Smokeless tobacco: Former    Types: Nurse, children's Use: Never used  Substance and Sexual Activity   Alcohol use: Yes    Comment: Daily 3 glasses of vodka   Drug use: No   Sexual activity: Not Currently    Comment: lives with wife, retired from [employer], ran distribution. No dietary restrictions.  Other Topics Concern   Not on file  Social History Narrative   Reg exercise   Social Determinants of Health   Financial Resource Strain: Low Risk  (03/07/2020)   Overall Financial Resource Strain (CARDIA)    Difficulty of Paying Living Expenses: Not hard at all  Food Insecurity: No Food Insecurity (03/07/2020)   Hunger Vital Sign    Worried About Running Out of Food in the Last Year: Never true    Ran Out of Food in the Last Year: Never true  Transportation Needs: No Transportation Needs (03/07/2020)   PRAPARE - Hydrologist (Medical): No    Lack of Transportation (Non-Medical): No  Physical Activity: Inactive (03/08/2021)   Exercise Vital Sign    Days of Exercise per Week: 0 days    Minutes of Exercise per Session: 0 min   Stress: No Stress Concern Present (03/08/2021)   [place] -  Occupational Stress Questionnaire    Feeling of Stress : Not at all  Social Connections: Moderately Integrated (03/08/2021)   Social Connection and Isolation Panel [NHANES]    Frequency of Communication with Friends and Family: More than three times a week    Frequency of Social Gatherings with Friends and Family: More than three times a week    Attends Religious Services: Never    Marine scientist or Organizations: Yes    Attends Archivist Meetings: 1 to 4 times per year    Marital Status: Married    Tobacco Counseling Counseling given: Not Answered   Clinical Intake:                 Diabetic?no         Activities of Daily Living     No data to display          Patient Care Team: [name], Long as PCP - General (Family Medicine) Kris Mouton Chauncy Passy, Long as Referring Physician (Nephrology) Curt Bears, Long as Consulting Physician (Oncology) Geri Seminole, Long as Referring Physician (Dermatopathology) Marica Otter, OD as Consulting Physician (Optometry)  Indicate any recent Medical Services you may have received from other than [facility] providers in the past year (date may be approximate).     Assessment:   This is a routine wellness examination for Kyle Long.  Hearing/Vision screen No results found.  Dietary issues and exercise activities discussed:     Goals Addressed   None    Depression Screen    03/08/2021    9:03 AM 02/02/2021    9:20 AM 03/07/2020    1:58 PM 11/18/2017   11:17 AM 11/16/2016   10:16 AM 11/15/2015   10:24 AM 08/05/2014    2:57 PM  PHQ 2/9 Scores  PHQ - 2 Score 0 0 0 0 0 0 0    Fall Risk    03/08/2021    9:02 AM 02/02/2021    9:20 AM 03/07/2020    1:58 PM 11/18/2017   11:17 AM 11/16/2016   10:16 AM  [place] in the past year? 0 0 0 No No  Number falls in past yr: 0 0 0    Injury with Fall? 0 0 0     Follow up Falls prevention discussed  Education provided;Falls prevention discussed      FALL RISK PREVENTION PERTAINING TO THE HOME:  Any stairs in or around the home? {YES/NO:21197} If so, are there any without handrails? {YES/NO:21197} Home free of loose throw rugs in walkways, pet beds, electrical cords, etc? {YES/NO:21197} Adequate lighting in your home to reduce risk of falls? {YES/NO:21197}  ASSISTIVE DEVICES UTILIZED TO PREVENT FALLS:  Life alert? {YES/NO:21197} Use of a cane, walker or w/c? {YES/NO:21197} Grab bars in the bathroom? {YES/NO:21197} Shower chair or bench in shower? {YES/NO:21197} Elevated toilet seat or a handicapped toilet? {YES/NO:21197}  TIMED UP AND GO:  Was the test performed? {YES/NO:21197}.  Length of time to ambulate 10 feet: *** sec.   {Appearance of GFRE:3200379}  Cognitive Function:    11/18/2017   11:19 AM 11/16/2016   10:17 AM 11/15/2015   10:36 AM  MMSE - Mini Mental State Exam  Orientation to time _0 Orientation to Place _1 Registration _2 Attention/ Calculation _3 Recall _4 Language- name 2 objects _5 Language- repeat _6 Language- follow 3  step command _0 Language- read & follow direction _1 Write a sentence _2 Copy design _3 Total score _4 Immunizations Immunization History  Administered Date(s) Administered   Fluad Quad(high Dose 65+) 04/21/2019, 05/13/2020, 05/05/2021   Influenza, High Dose Seasonal PF 06/06/2016, 06/07/2017, 05/19/2018   Influenza,inj,Quad PF,6+ Mos 06/17/2015   PFIZER Comirnaty(Gray Top)Covid-19 Tri-Sucrose Vaccine 02/02/2021   PFIZER(Purple Top)SARS-COV-2 Vaccination 08/11/2019, 08/31/2019, 05/13/2020   Pfizer Covid-19 Vaccine Bivalent Booster 66yr & up 07/19/2021   Pneumococcal Conjugate-13 12/13/2014   Pneumococcal Polysaccharide-23 02/01/2010   Td 09/27/2001   Tdap 09/04/2012    {TDAP status:2101805}  Flu Vaccine status: Up to  date  Pneumococcal vaccine status: Up to date  Covid-19 vaccine status: Completed vaccines  Qualifies for Shingles Vaccine? Yes   Zostavax completed No   {Shingrix Completed?:2101804}  Screening Tests Health Maintenance  Topic Date Due   COVID-19 Vaccine (6 - Pfizer risk series) 09/13/2021   INFLUENZA VACCINE  02/27/2022   Zoster Vaccines- Shingrix (1 of 2) 05/05/2022 (Originally 04/12/1965)   TETANUS/TDAP  09/04/2022   COLONOSCOPY (Pts 45-44yrInsurance coverage will need to be confirmed)  01/05/2025   Pneumonia Vaccine 6576Years old  Completed   Hepatitis C Screening  Completed   HPV VACCINES  Aged Out    Health Maintenance  Health Maintenance Due  Topic Date Due   COVID-19 Vaccine (6 - Pfizer risk series) 09/13/2021   INFLUENZA VACCINE  02/27/2022    {Colorectal cancer screening:2101809}  Lung Cancer Screening: (Low Dose CT Chest recommended if Age 78-80ears, 30 pack-year currently smoking OR have quit w/in 15years.) {DOES NOT does:27190::"does not"} qualify.   Lung Cancer Screening Referral: ***  Additional Screening:  Hepatitis C Screening: {DOES NOT does:27190::"does not"} qualify; Completed ***  Vision Screening: Recommended annual ophthalmology exams for early detection of glaucoma and other disorders of the eye. Is the patient up to date with their annual eye exam?  {YES/NO:21197} Who is the provider or what is the name of the office in which the patient attends annual eye exams? *** If pt is not established with a provider, would they like to be referred to a provider to establish care? {YES/NO:21197}.   Dental Screening: Recommended annual dental exams for proper oral hygiene  Community Resource Referral / Chronic Care Management: CRR required this visit?  {YES/NO:21197}  CCM required this visit?  {YES/NO:21197}     Plan:     I have personally reviewed and noted the following in the patient's chart:   Medical and social history Use of alcohol,  tobacco or illicit drugs  Current medications and supplements including opioid prescriptions. {Opioid Prescriptions:[phone]} Functional ability and status Nutritional status Physical activity Advanced directives List of other physicians Hospitalizations, surgeries, and ER visits in previous 12 months Vitals Screenings to include cognitive, depression, and falls Referrals and appointments  In addition, I have reviewed and discussed with patient certain preventive protocols, quality metrics, and best practice recommendations. A written personalized care plan for preventive services as well as general preventive health recommendations were provided to patient.     ShDuard Bradyhism, CMWahkon 03/13/2022   Nurse Notes: ***

## 2022-03-14 ENCOUNTER — Ambulatory Visit (INDEPENDENT_AMBULATORY_CARE_PROVIDER_SITE_OTHER): Payer: Medicare HMO

## 2022-03-14 ENCOUNTER — Telehealth: Payer: Self-pay

## 2022-03-14 DIAGNOSIS — Z0000 Encounter for general adult medical examination without abnormal findings: Secondary | ICD-10-CM | POA: Diagnosis not present

## 2022-03-14 NOTE — Telephone Encounter (Signed)
Pt aware.

## 2022-03-14 NOTE — Telephone Encounter (Signed)
Pt would like to know if it is safe for him to take B12 with his health conditions.

## 2022-03-14 NOTE — Patient Instructions (Signed)
[name] , Thank you for taking time to come for your Medicare Wellness Visit. I appreciate your ongoing commitment to your health goals. Please review the following plan we discussed and let me know if I can assist you in the future.   Screening recommendations/referrals: Colonoscopy: no longer needed Recommended yearly ophthalmology/optometry visit for glaucoma screening and checkup Recommended yearly dental visit for hygiene and checkup  Vaccinations: Influenza vaccine: up to date Pneumococcal vaccine: up to date Tdap vaccine: Due-May obtain vaccine at our office or your local pharmacy.  Shingles vaccine: up to date per pt   Covid-19: completed  Advanced directives: yes, on file  Conditions/risks identified: see problem list  Next appointment: Follow up in one year for your annual wellness visit. 03/19/23  Preventive Care 65 Years and Older, Male Preventive care refers to lifestyle choices and visits with your health care provider that can promote health and wellness. What does preventive care include? A yearly physical exam. This is also called an annual well check. Dental exams once or twice a year. Routine eye exams. Ask your health care provider how often you should have your eyes checked. Personal lifestyle choices, including: Daily care of your teeth and gums. Regular physical activity. Eating a healthy diet. Avoiding tobacco and drug use. Limiting alcohol use. Practicing safe sex. Taking low doses of aspirin every day. Taking vitamin and mineral supplements as recommended by your health care provider. What happens during an annual well check? The services and screenings done by your health care provider during your annual well check will depend on your age, overall health, lifestyle risk factors, and family history of disease. Counseling  Your health care provider may ask you questions about your: Alcohol use. Tobacco use. Drug use. Emotional well-being. Home  and relationship well-being. Sexual activity. Eating habits. History of falls. Memory and ability to understand (cognition). Work and work Statistician. Screening  You may have the following tests or measurements: Height, weight, and BMI. Blood pressure. Lipid and cholesterol levels. These may be checked every 5 years, or more frequently if you are over 63 years old. Skin check. Lung cancer screening. You may have this screening every year starting at age 78 if you have a 30-pack-year history of smoking and currently smoke or have quit within the past 15 years. Fecal occult blood test (FOBT) of the stool. You may have this test every year starting at age 78 Flexible sigmoidoscopy or colonoscopy. You may have a sigmoidoscopy every 5 years or a colonoscopy every 10 years starting at age 78. Prostate cancer screening. Recommendations will vary depending on your family history and other risks. Hepatitis C blood test. Hepatitis B blood test. Sexually transmitted disease (STD) testing. Diabetes screening. This is done by checking your blood sugar (glucose) after you have not eaten for a while (fasting). You may have this done every 78-78 years. Abdominal aortic aneurysm (AAA) screening. You may need this if you are a current or former smoker. Osteoporosis. You may be screened starting at age 61 if you are at high risk. Talk with your health care provider about your test results, treatment options, and if necessary, the need for more tests. Vaccines  Your health care provider may recommend certain vaccines, such as: Influenza vaccine. This is recommended every year. Tetanus, diphtheria, and acellular pertussis (Tdap, Td) vaccine. You may need a Td booster every 10 years. Zoster vaccine. You may need this after age 1. Pneumococcal 13-valent conjugate (PCV13) vaccine. One dose is recommended after age 78 Pneumococcal polysaccharide (PPSV23) vaccine. One dose is recommended after age 78. Talk to  your health care provider about which screenings and vaccines you need and how often you need them. This information is not intended to replace advice given to you by your health care provider. Make sure you discuss any questions you have with your health care provider. Document Released: 08/12/2015 Document Revised: 04/04/2016 Document Reviewed: 05/17/2015 Elsevier Interactive Patient Education  2017 [place] Prevention in the Home Falls can cause injuries. They can happen to people of all ages. There are many things you can do to make your home safe and to help prevent falls. What can I do on the outside of my home? Regularly fix the edges of walkways and driveways and fix any cracks. Remove anything that might make you trip as you walk through a door, such as a raised step or threshold. Trim any bushes or trees on the path to your home. Use bright outdoor lighting. Clear any walking paths of anything that might make someone trip, such as rocks or tools. Regularly check to see if handrails are loose or broken. Make sure that both sides of any steps have handrails. Any raised decks and porches should have guardrails on the edges. Have any leaves, snow, or ice cleared regularly. Use sand or salt on walking paths during winter. Clean up any spills in your garage right away. This includes oil or grease spills. What can I do in the bathroom? Use night lights. Install grab bars by the toilet and in the tub and shower. Do not use towel bars as grab bars. Use non-skid mats or decals in the tub or shower. If you need to sit down in the shower, use a plastic, non-slip stool. Keep the floor dry. Clean up any water that spills on the floor as soon as it happens. Remove soap buildup in the tub or shower regularly. Attach bath mats securely with double-sided non-slip rug tape. Do not have throw rugs and other things on the floor that can make you trip. What can I do in the bedroom? Use  night lights. Make sure that you have a light by your bed that is easy to reach. Do not use any sheets or blankets that are too big for your bed. They should not hang down onto the floor. Have a firm chair that has side arms. You can use this for support while you get dressed. Do not have throw rugs and other things on the floor that can make you trip. What can I do in the kitchen? Clean up any spills right away. Avoid walking on wet floors. Keep items that you use a lot in easy-to-reach places. If you need to reach something above you, use a strong step stool that has a grab bar. Keep electrical cords out of the way. Do not use floor polish or wax that makes floors slippery. If you must use wax, use non-skid floor wax. Do not have throw rugs and other things on the floor that can make you trip. What can I do with my stairs? Do not leave any items on the stairs. Make sure that there are handrails on both sides of the stairs and use them. Fix handrails that are broken or loose. Make sure that handrails are as long as the stairways. Check any carpeting to make sure that it is firmly attached to the stairs. Fix any carpet that is loose or worn. Avoid having throw rugs at  the top or bottom of the stairs. If you do have throw rugs, attach them to the floor with carpet tape. Make sure that you have a light switch at the top of the stairs and the bottom of the stairs. If you do not have them, ask someone to add them for you. What else can I do to help prevent falls? Wear shoes that: Do not have high heels. Have rubber bottoms. Are comfortable and fit you well. Are closed at the toe. Do not wear sandals. If you use a stepladder: Make sure that it is fully opened. Do not climb a closed stepladder. Make sure that both sides of the stepladder are locked into place. Ask someone to hold it for you, if possible. Clearly mark and make sure that you can see: Any grab bars or handrails. First and last  steps. Where the edge of each step is. Use tools that help you move around (mobility aids) if they are needed. These include: Canes. Walkers. Scooters. Crutches. Turn on the lights when you go into a dark area. Replace any light bulbs as soon as they burn out. Set up your furniture so you have a clear path. Avoid moving your furniture around. If any of your floors are uneven, fix them. If there are any pets around you, be aware of where they are. Review your medicines with your doctor. Some medicines can make you feel dizzy. This can increase your chance of falling. Ask your doctor what other things that you can do to help prevent falls. This information is not intended to replace advice given to you by your health care provider. Make sure you discuss any questions you have with your health care provider. Document Released: 05/12/2009 Document Revised: 12/22/2015 Document Reviewed: 08/20/2014 Elsevier Interactive Patient Education  2017 [employer].

## 2022-03-22 ENCOUNTER — Other Ambulatory Visit: Payer: Self-pay | Admitting: Family Medicine

## 2022-03-22 DIAGNOSIS — I10 Essential (primary) hypertension: Secondary | ICD-10-CM

## 2022-04-17 ENCOUNTER — Telehealth (INDEPENDENT_AMBULATORY_CARE_PROVIDER_SITE_OTHER): Payer: Medicare HMO | Admitting: Medical

## 2022-04-17 DIAGNOSIS — R059 Cough, unspecified: Secondary | ICD-10-CM | POA: Diagnosis not present

## 2022-04-17 DIAGNOSIS — J40 Bronchitis, not specified as acute or chronic: Secondary | ICD-10-CM | POA: Diagnosis not present

## 2022-04-17 DIAGNOSIS — U071 COVID-19: Secondary | ICD-10-CM

## 2022-04-17 MED ORDER — AZITHROMYCIN 250 MG PO TABS: ORAL_TABLET | ORAL | 0 refills | Status: AC

## 2022-04-17 MED ORDER — BENZONATATE 100 MG PO CAPS
100.0000 mg | ORAL_CAPSULE | Freq: Three times a day (TID) | ORAL | 0 refills | Status: DC | PRN
Start: 2022-04-17 — End: 2022-10-08

## 2022-04-17 NOTE — Progress Notes (Signed)
   Subjective:    Patient ID: Kyle Long, male    DOB: Sep 18, 1945, 76 y.o.   MRN: [id number]  HPI  Virtual Visit via Video Note  I connected with Kyle Long on 04/17/22 at  1:40 PM EDT by a video enabled telemedicine application and verified that I am speaking with the correct person using two identifiers.  Location: Patient: home Provider: office   I discussed the limitations of evaluation and management by telemedicine and the availability of in person appointments. The patient expressed understanding and agreed to proceed.  History of Present Illness: Pt had covid ago yesterday had covid. Thinks tested + Monday last week 04-09-2022. Symptoms started on Sunday.  Pt states he felt sick for about 3-4 days. He had fatigue, runny nose, cough and some mucus production and blowing out some clear mucus from nose. No wheezing and no sob. Pt does not have any o2 sat monitor.  Feels mild nasal congested.  Cough at times wakes. Random cough at night.    Pt had various covid vaccines in the past. Lat one was one year.  He states he feels on and off sluggish briefly during the day. If he rests will gain energy back quick.       Observations/Objective: General-no acute distress, pleasant, oriented. Lungs- on inspection lungs appear unlabored. Neck- no tracheal deviation or jvd on inspection. Neuro- gross motor function appears intact.   Assessment and Plan:  Patient Instructions  Recent COVID infection.  History of vaccination against COVID this 5-6 times period.  Last vaccine was a year ago.  Presently some residual fatigue, runny nose, productive cough and clear mucus drainage from nose.  Discussed via virtual visit difficult to fully assess whether or not having secondary infectious such as sinus infection, bronchitis or pneumonia.  After discussion decided to go ahead and prescribe azithromycin antibiotic, benzonatate for cough and advised to use Flonase.  Continue  vitamin D and B12.  If signs and symptoms worsening or changing despite the above measures and recommend an office visit next week.  Canceled if having significant post-COVID fatigue and might consider N-acetylcysteine(NAC)  Follow-up in 2 to 3 weeks or sooner if needed.  Discussed today on when to get COVID-vaccine, flu vaccine and RSV.     Mackie Pai, PA-C  Follow Up Instructions:    I discussed the assessment and treatment plan with the patient. The patient was provided an opportunity to ask questions and all were answered. The patient agreed with the plan and demonstrated an understanding of the instructions.   The patient was advised to call back or seek an in-person evaluation if the symptoms worsen or if the condition fails to improve as anticipated.     Mackie Pai, PA-C   Review of Systems  Constitutional:  Negative for chills, fatigue and fever.  HENT:  Positive for congestion and postnasal drip. Negative for sinus pressure and sore throat.   Respiratory:  Positive for cough. Negative for shortness of breath and wheezing.   Cardiovascular:  Negative for chest pain and palpitations.  Gastrointestinal:  Negative for anal bleeding.  Genitourinary:  Negative for dysuria and flank pain.  Musculoskeletal:  Negative for back pain and myalgias.  Skin:  Negative for rash.       Objective:   Physical Exam        Assessment & Plan:

## 2022-04-17 NOTE — Patient Instructions (Signed)
Recent COVID infection.  History of vaccination against COVID this 5-6 times period.  Last vaccine was a year ago.  Presently some residual fatigue, runny nose, productive cough and clear mucus drainage from nose.  Discussed via virtual visit difficult to fully assess whether or not having secondary infectious such as sinus infection, bronchitis or pneumonia.  After discussion decided to go ahead and prescribe azithromycin antibiotic, benzonatate for cough and advised to use Flonase.  Continue vitamin D and B12.  If signs and symptoms worsening or changing despite the above measures and recommend an office visit next week.  Canceled if having significant post-COVID fatigue and might consider N-acetylcysteine(NAC)  Follow-up in 2 to 3 weeks or sooner if needed.  Discussed today on when to get COVID-vaccine, flu vaccine and RSV.

## 2022-05-22 ENCOUNTER — Other Ambulatory Visit: Payer: Self-pay

## 2022-05-22 MED ORDER — LOSARTAN POTASSIUM 100 MG PO TABS: 50.0000 mg | ORAL_TABLET | Freq: Every day | ORAL | 1 refills | Status: DC

## 2022-05-27 NOTE — Assessment & Plan Note (Signed)
Encourage heart healthy diet such as MIND or DASH diet, increase exercise, avoid trans fats, simple carbohydrates and processed foods, consider a krill or fish or flaxseed oil cap daily.  °

## 2022-05-27 NOTE — Assessment & Plan Note (Signed)
Encouraged DASH or MIND diet, decrease po intake and increase exercise as tolerated. Needs 7-8 hours of sleep nightly. Avoid trans fats, eat small, frequent meals every 4-5 hours with lean proteins, complex carbs and healthy fats. Minimize simple carbs, high fat foods and processed foods

## 2022-05-27 NOTE — Progress Notes (Signed)
Subjective:    Patient ID: Kyle Long, male    DOB: [birth year], 76 y.o.   MRN: [id number]  No chief complaint on file.   HPI Patient is in today for ***  Past Medical History:  Diagnosis Date  . Allergic state 06/26/2015  . Amnesia 2009   isolated;for 30 mins w/elevated BP  . Arthritis   . Cancer (HCC)    skin / top head  . Cataract    bilateral repair  . Cellulitis 2011   LUE (no PMH of MRSA)/left elbow  . Chronic kidney disease    resolved  . Diverticulosis   . Diverticulosis of colon   . GERD (gastroesophageal reflux disease)    OCC HEARTBURN  . Gilbert's syndrome   . Hematuria 2009   Dr Merry Lofty  . Hyperglycemia   . Hyperlipidemia   . Hypertension   . Lung cancer, lower lobe (HCC)    Stage IIA (T1,N1)  . Lymphoma (HCC)    Low grad  . Neuromuscular disorder (HCC)    2010 TEMPORARY MEMORY LOSS 1-1.5 HR WAS CHECKED OUT AT [facility] , NOTHING FOUND  . Peptic ulcer 1995  . Peyronie disease   . Preventative health care 12/19/2015  . Primary cancer of left lower lobe of lung (HCC) 11/27/2011  . Recurrent upper respiratory infection (URI)    09/2011  BRONCHITIS  . SCC (squamous cell carcinoma) 01/21/2009   Qualifier: Diagnosis of  By: Alwyn Ren MD, Chrissie Noa   ? Basal Cell      Past Surgical History:  Procedure Laterality Date  . chemo treatment      4 treatments that last 7 hours each/last treatmebt in 2013  . COLONOSCOPY  2015  . COLONOSCOPY W/ POLYPECTOMY  2005   due 2015  . CYSTOSCOPY  2009  . LOBECTOMY  11/02/2011   Procedure: LOBECTOMY;  Surgeon: Loreli Slot, MD;  Location: [facility] [place] OR;  Service: Thoracic;  Laterality: Left;  LEFT LOWER LOBECTOMY  . REFRACTIVE SURGERY     Bil  . SKIN BIOPSY     4 0r 5  . Squamous Cell Cancer Excision  12/13/2016  . SUPRACLAVICAL NODE BIOPSY Left 10/15/2014   Procedure: LEFT SUPRACLAVICAL LYMPH NODE BIOPSY;  Surgeon: Loreli Slot, MD;  Location: [facility] OR;  Service: Thoracic;  Laterality: Left;  LEFT  SUPRACLAVICAL LYMPH NODE BIOPSY  . VASECTOMY    . VEIN SURGERY     R ankle    Family History  Problem Relation Age of Onset  . Stroke Mother        in 68s  . Hypertension Mother   . Breast cancer Mother   . Neuropathy Mother   . Melanoma Father   . Heart attack Father 65       stents  . Dementia Father   . Hypertension Sister   . Thyroid disease Sister        ??  . Multiple myeloma Sister   . Cancer Sister   . Stomach cancer Maternal Grandfather 73  . Lung cancer Maternal Grandfather   . Cancer Paternal Grandmother        lung, nonsmoker  . Headache Daughter   . Diabetes Paternal Aunt   . Colon cancer Neg Hx   . Colon polyps Neg Hx   . Esophageal cancer Neg Hx   . Rectal cancer Neg Hx     Social History   Socioeconomic History  . Marital status: Married    Spouse name: Not on  file  . Number of children: Not on file  . Years of education: Not on file  . Highest education level: Not on file  Occupational History  . Occupation: retired  Tobacco Use  . Smoking status: Former    Packs/day: 1.00    Years: 15.00    Total pack years: 15.00    Types: Cigarettes    Quit date: 07/31/1979    Years since quitting: 42.8  . Smokeless tobacco: Former    Types: Engineer, drilling  . Vaping Use: Never used  Substance and Sexual Activity  . Alcohol use: Yes    Comment: Daily 3 glasses of vodka  . Drug use: No  . Sexual activity: Not Currently    Comment: lives with wife, retired from [employer], ran distribution. No dietary restrictions.  Other Topics Concern  . Not on file  Social History Narrative   Reg exercise   Social Determinants of Health   Financial Resource Strain: Low Risk  (03/07/2020)   Overall Financial Resource Strain (CARDIA)   . Difficulty of Paying Living Expenses: Not hard at all  Food Insecurity: No Food Insecurity (03/07/2020)   Hunger Vital Sign   . Worried About Programme researcher, broadcasting/film/video in the Last Year: Never true   . Ran Out of Food in the Last  Year: Never true  Transportation Needs: No Transportation Needs (03/07/2020)   PRAPARE - Transportation   . Lack of Transportation (Medical): No   . Lack of Transportation (Non-Medical): No  Physical Activity: Inactive (03/08/2021)   Exercise Vital Sign   . Days of Exercise per Week: 0 days   . Minutes of Exercise per Session: 0 min  Stress: No Stress Concern Present (03/08/2021)   [employer] of Occupational Health - Occupational Stress Questionnaire   . Feeling of Stress : Not at all  Social Connections: Moderately Integrated (03/08/2021)   Social Connection and Isolation Panel [NHANES]   . Frequency of Communication with Friends and Family: More than three times a week   . Frequency of Social Gatherings with Friends and Family: More than three times a week   . Attends Religious Services: Never   . Active Member of Clubs or Organizations: Yes   . Attends Banker Meetings: 1 to 4 times per year   . Marital Status: Married  Catering manager Violence: Not At Risk (03/08/2021)   Humiliation, Afraid, Rape, and Kick questionnaire   . Fear of Current or Ex-Partner: No   . Emotionally Abused: No   . Physically Abused: No   . Sexually Abused: No    Outpatient Medications Prior to Visit  Medication Sig Dispense Refill  . benzonatate (TESSALON) 100 MG capsule Take 1 capsule (100 mg total) by mouth 3 (three) times daily as needed for cough. 30 capsule 0  . chlorthalidone (HYGROTON) 25 MG tablet TAKE 1 TABLET DAILY 90 tablet 1  . diltiazem (CARDIZEM CD) 240 MG 24 hr capsule TAKE 1 CAPSULE DAILY 90 capsule 1  . losartan (COZAAR) 100 MG tablet Take 0.5 tablets (50 mg total) by mouth daily. 45 tablet 1  . metoprolol tartrate (LOPRESSOR) 25 MG tablet TAKE 1 TABLET TWICE A DAY 180 tablet 2  . mometasone (ELOCON) 0.1 % cream Apply 1 application topically 2 (two) times daily as needed. Dr Eilleen Kempf dermatology [facility]     No facility-administered medications prior to  visit.    Allergies  Allergen Reactions  . Lisinopril Cough    Review of  Systems  Constitutional:  Negative for fever and malaise/fatigue.  HENT:  Negative for congestion.   Eyes:  Negative for blurred vision.  Respiratory:  Negative for shortness of breath.   Cardiovascular:  Negative for chest pain, palpitations and leg swelling.  Gastrointestinal:  Negative for abdominal pain, blood in stool and nausea.  Genitourinary:  Negative for dysuria and frequency.  Musculoskeletal:  Negative for falls.  Skin:  Negative for rash.  Neurological:  Negative for dizziness, loss of consciousness and headaches.  Endo/Heme/Allergies:  Negative for environmental allergies.  Psychiatric/Behavioral:  Negative for depression. The patient is not nervous/anxious.       Objective:    Physical Exam Constitutional:      General: He is not in acute distress.    Appearance: Normal appearance. He is not ill-appearing or toxic-appearing.  HENT:     Head: Normocephalic and atraumatic.     Right Ear: External ear normal.     Left Ear: External ear normal.     Nose: Nose normal.     Mouth/Throat:     Mouth: Mucous membranes are moist.  Eyes:     General:        Right eye: No discharge.        Left eye: No discharge.  Cardiovascular:     Rate and Rhythm: Normal rate.  Pulmonary:     Effort: Pulmonary effort is normal.     Breath sounds: No wheezing.  Abdominal:     General: Bowel sounds are normal.     Palpations: There is no mass.     Tenderness: There is no abdominal tenderness.  Musculoskeletal:     Cervical back: Neck supple.     Right lower leg: No edema.     Left lower leg: No edema.  Skin:    Findings: No rash.  Neurological:     Mental Status: He is alert and oriented to person, place, and time.  Psychiatric:        Behavior: Behavior normal.   There were no vitals taken for this visit. Wt Readings from Last 3 Encounters:  02/19/22 233 lb 9.6 oz (106 kg)  09/20/21 234 lb 12.8  oz (106.5 kg)  03/08/21 232 lb (105.2 kg)    Diabetic Foot Exam - Simple   No data filed    Lab Results  Component Value Date   WBC 13.3 (H) 02/16/2022   HGB 14.8 02/16/2022   HCT 41.8 02/16/2022   PLT 215 02/16/2022   GLUCOSE 146 (H) 02/16/2022   CHOL 187 09/20/2021   TRIG 343.0 (H) 09/20/2021   HDL 44.70 09/20/2021   LDLDIRECT 95.0 09/20/2021   LDLCALC 86 12/15/2015   ALT 18 02/16/2022   AST 18 02/16/2022   NA 135 02/16/2022   K 3.3 (L) 02/16/2022   CL 95 (L) 02/16/2022   CREATININE 1.47 (H) 02/16/2022   BUN 24 (H) 02/16/2022   CO2 31 02/16/2022   TSH 2.67 09/20/2021   PSA 2.31 09/20/2021   INR 1.03 10/12/2014   HGBA1C 6.1 09/20/2021    Lab Results  Component Value Date   TSH 2.67 09/20/2021   Lab Results  Component Value Date   WBC 13.3 (H) 02/16/2022   HGB 14.8 02/16/2022   HCT 41.8 02/16/2022   MCV 93.3 02/16/2022   PLT 215 02/16/2022   Lab Results  Component Value Date   NA 135 02/16/2022   K 3.3 (L) 02/16/2022   CHLORIDE 104 02/15/2017   CO2 31 02/16/2022  GLUCOSE 146 (H) 02/16/2022   BUN 24 (H) 02/16/2022   CREATININE 1.47 (H) 02/16/2022   BILITOT 1.2 02/16/2022   ALKPHOS 52 02/16/2022   AST 18 02/16/2022   ALT 18 02/16/2022   PROT 7.3 02/16/2022   ALBUMIN 4.3 02/16/2022   CALCIUM 10.2 02/16/2022   ANIONGAP 9 02/16/2022   EGFR 49 (L) 02/15/2017   GFR 46.89 (L) 09/20/2021   Lab Results  Component Value Date   CHOL 187 09/20/2021   Lab Results  Component Value Date   HDL 44.70 09/20/2021   Lab Results  Component Value Date   LDLCALC 86 12/15/2015   Lab Results  Component Value Date   TRIG 343.0 (H) 09/20/2021   Lab Results  Component Value Date   CHOLHDL 4 09/20/2021   Lab Results  Component Value Date   HGBA1C 6.1 09/20/2021       Assessment & Plan:   Problem List Items Addressed This Visit     Hyperlipidemia, mixed    Encourage heart healthy diet such as MIND or DASH diet, increase exercise, avoid trans fats,  simple carbohydrates and processed foods, consider a krill or fish or flaxseed oil cap daily.       Essential hypertension    Well controlled, no changes to meds. Encouraged heart healthy diet such as the DASH diet and exercise as tolerated.       Hyperglycemia    hgba1c acceptable, minimize simple carbs. Increase exercise as tolerated.       Obesity    Encouraged DASH or MIND diet, decrease po intake and increase exercise as tolerated. Needs 7-8 hours of sleep nightly. Avoid trans fats, eat small, frequent meals every 4-5 hours with lean proteins, complex carbs and healthy fats. Minimize simple carbs, high fat foods and processed foods      CKD (chronic kidney disease), stage III (HCC)    Hydrate and monitor       I am having [name] maintain his mometasone, metoprolol tartrate, diltiazem, chlorthalidone, benzonatate, and losartan.  No orders of the defined types were placed in this encounter.    Danise Edge, MD

## 2022-05-27 NOTE — Assessment & Plan Note (Addendum)
hgba1c acceptable, minimize simple carbs. Increase exercise as tolerated. Given flu shot today 

## 2022-05-27 NOTE — Assessment & Plan Note (Signed)
Well controlled, no changes to meds. Encouraged heart healthy diet such as the DASH diet and exercise as tolerated.  °

## 2022-05-27 NOTE — Assessment & Plan Note (Signed)
Hydrate and monitor, follows with nephrology 

## 2022-05-28 ENCOUNTER — Ambulatory Visit (INDEPENDENT_AMBULATORY_CARE_PROVIDER_SITE_OTHER): Payer: Medicare HMO | Admitting: Family Medicine

## 2022-05-28 ENCOUNTER — Encounter: Payer: Self-pay | Admitting: Family Medicine

## 2022-05-28 VITALS — BP 136/74 | HR 83 | Temp 98.0°F | Resp 16 | Ht 65.0 in | Wt 230.0 lb

## 2022-05-28 DIAGNOSIS — I10 Essential (primary) hypertension: Secondary | ICD-10-CM

## 2022-05-28 DIAGNOSIS — Z23 Encounter for immunization: Secondary | ICD-10-CM

## 2022-05-28 DIAGNOSIS — E782 Mixed hyperlipidemia: Secondary | ICD-10-CM | POA: Diagnosis not present

## 2022-05-28 DIAGNOSIS — N183 Chronic kidney disease, stage 3 unspecified: Secondary | ICD-10-CM | POA: Diagnosis not present

## 2022-05-28 DIAGNOSIS — E6609 Other obesity due to excess calories: Secondary | ICD-10-CM

## 2022-05-28 DIAGNOSIS — R739 Hyperglycemia, unspecified: Secondary | ICD-10-CM

## 2022-05-28 DIAGNOSIS — C8300 Small cell B-cell lymphoma, unspecified site: Secondary | ICD-10-CM

## 2022-05-28 DIAGNOSIS — R351 Nocturia: Secondary | ICD-10-CM | POA: Diagnosis not present

## 2022-05-28 DIAGNOSIS — R350 Frequency of micturition: Secondary | ICD-10-CM

## 2022-05-28 DIAGNOSIS — N401 Enlarged prostate with lower urinary tract symptoms: Secondary | ICD-10-CM | POA: Diagnosis not present

## 2022-05-28 DIAGNOSIS — Z8616 Personal history of COVID-19: Secondary | ICD-10-CM | POA: Diagnosis not present

## 2022-05-28 LAB — COMPREHENSIVE METABOLIC PANEL
ALT: 26 U/L (ref 0–53)
AST: 26 U/L (ref 0–37)
Albumin: 4.5 g/dL (ref 3.5–5.2)
Alkaline Phosphatase: 54 U/L (ref 39–117)
BUN: 25 mg/dL — ABNORMAL HIGH (ref 6–23)
CO2: 29 mEq/L (ref 19–32)
Calcium: 10 mg/dL (ref 8.4–10.5)
Chloride: 95 mEq/L — ABNORMAL LOW (ref 96–112)
Creatinine, Ser: 1.41 mg/dL (ref 0.40–1.50)
GFR: 47.86 mL/min — ABNORMAL LOW (ref >60.00)
Glucose, Bld: 123 mg/dL — ABNORMAL HIGH (ref 70–99)
Potassium: 3.5 mEq/L (ref 3.5–5.1)
Sodium: 136 mEq/L (ref 135–145)
Total Bilirubin: 1.2 mg/dL (ref 0.2–1.2)
Total Protein: 7.3 g/dL (ref 6.0–8.3)

## 2022-05-28 LAB — TSH: TSH: 1.96 u[IU]/mL (ref 0.35–5.50)

## 2022-05-28 LAB — CBC WITH DIFFERENTIAL/PLATELET
Basophils Absolute: 0.1 10*3/uL (ref 0.0–0.1)
Basophils Relative: 0.6 % (ref 0.0–3.0)
Eosinophils Absolute: 0.1 10*3/uL (ref 0.0–0.7)
Eosinophils Relative: 1.4 % (ref 0.0–5.0)
HCT: 44.6 % (ref 39.0–52.0)
Hemoglobin: 15.2 g/dL (ref 13.0–17.0)
Lymphocytes Relative: 33.7 % (ref 12.0–46.0)
Lymphs Abs: 3.2 10*3/uL (ref 0.7–4.0)
MCHC: 34.1 g/dL (ref 30.0–36.0)
MCV: 98.8 fl (ref 78.0–100.0)
Monocytes Absolute: 0.9 10*3/uL (ref 0.1–1.0)
Monocytes Relative: 9.6 % (ref 3.0–12.0)
Neutro Abs: 5.2 10*3/uL (ref 1.4–7.7)
Neutrophils Relative %: 54.7 % (ref 43.0–77.0)
Platelets: 220 10*3/uL (ref 150.0–400.0)
RBC: 4.52 Mil/uL (ref 4.22–5.81)
RDW: 14.5 % (ref 11.5–15.5)
WBC: 9.5 10*3/uL (ref 4.0–10.5)

## 2022-05-28 LAB — LIPID PANEL
Cholesterol: 191 mg/dL (ref 0–200)
HDL: 54.5 mg/dL (ref >39.00)
LDL Cholesterol: 98 mg/dL (ref 0–99)
NonHDL: 136.41
Total CHOL/HDL Ratio: 4
Triglycerides: 193 mg/dL — ABNORMAL HIGH (ref 0.0–149.0)
VLDL: 38.6 mg/dL (ref 0.0–40.0)

## 2022-05-28 LAB — URINALYSIS
Bilirubin Urine: NEGATIVE
Hgb urine dipstick: NEGATIVE
Ketones, ur: NEGATIVE
Leukocytes,Ua: NEGATIVE
Nitrite: NEGATIVE
Specific Gravity, Urine: 1.01 (ref 1.000–1.030)
Total Protein, Urine: NEGATIVE
Urine Glucose: NEGATIVE
Urobilinogen, UA: 1 (ref 0.0–1.0)
pH: 6 (ref 5.0–8.0)

## 2022-05-28 LAB — PSA: PSA: 2.7 ng/mL (ref 0.10–4.00)

## 2022-05-28 LAB — HEMOGLOBIN A1C: Hgb A1c MFr Bld: 6 % (ref 4.6–6.5)

## 2022-05-28 NOTE — Assessment & Plan Note (Signed)
Following with oncology annually and doing well.

## 2022-05-28 NOTE — Assessment & Plan Note (Signed)
Worsening in the setting of bph and ckd, check labs today and referred to urology for further consideration and treatment

## 2022-05-28 NOTE — Patient Instructions (Addendum)
Hydrate with 60-80 ounces    Hypertension, Adult High blood pressure (hypertension) is when the force of blood pumping through the arteries is too strong. The arteries are the blood vessels that carry blood from the heart throughout the body. Hypertension forces the heart to work harder to pump blood and may cause arteries to become narrow or stiff. Untreated or uncontrolled hypertension can lead to a heart attack, heart failure, a stroke, kidney disease, and other problems. A blood pressure reading consists of a higher number over a lower number. Ideally, your blood pressure should be below 120/80. The first ("top") number is called the systolic pressure. It is a measure of the pressure in your arteries as your heart beats. The second ("bottom") number is called the diastolic pressure. It is a measure of the pressure in your arteries as the heart relaxes. What are the causes? The exact cause of this condition is not known. There are some conditions that result in high blood pressure. What increases the risk? Certain factors may make you more likely to develop high blood pressure. Some of these risk factors are under your control, including: Smoking. Not getting enough exercise or physical activity. Being overweight. Having too much fat, sugar, calories, or salt (sodium) in your diet. Drinking too much alcohol. Other risk factors include: Having a personal history of heart disease, diabetes, high cholesterol, or kidney disease. Stress. Having a family history of high blood pressure and high cholesterol. Having obstructive sleep apnea. Age. The risk increases with age. What are the signs or symptoms? High blood pressure may not cause symptoms. Very high blood pressure (hypertensive crisis) may cause: Headache. Fast or irregular heartbeats (palpitations). Shortness of breath. Nosebleed. Nausea and vomiting. Vision changes. Severe chest pain, dizziness, and seizures. How is this  diagnosed? This condition is diagnosed by measuring your blood pressure while you are seated, with your arm resting on a flat surface, your legs uncrossed, and your feet flat on the floor. The cuff of the blood pressure monitor will be placed directly against the skin of your upper arm at the level of your heart. Blood pressure should be measured at least twice using the same arm. Certain conditions can cause a difference in blood pressure between your right and left arms. If you have a high blood pressure reading during one visit or you have normal blood pressure with other risk factors, you may be asked to: Return on a different day to have your blood pressure checked again. Monitor your blood pressure at home for 1 week or longer. If you are diagnosed with hypertension, you may have other blood or imaging tests to help your health care provider understand your overall risk for other conditions. How is this treated? This condition is treated by making healthy lifestyle changes, such as eating healthy foods, exercising more, and reducing your alcohol intake. You may be referred for counseling on a healthy diet and physical activity. Your health care provider may prescribe medicine if lifestyle changes are not enough to get your blood pressure under control and if: Your systolic blood pressure is above 130. Your diastolic blood pressure is above 80. Your personal target blood pressure may vary depending on your medical conditions, your age, and other factors. Follow these instructions at home: Eating and drinking  Eat a diet that is high in fiber and potassium, and low in sodium, added sugar, and fat. An example of this eating plan is called the DASH diet. DASH stands for Dietary Approaches to  Stop Hypertension. To eat this way: Eat plenty of fresh fruits and vegetables. Try to fill one half of your plate at each meal with fruits and vegetables. Eat whole grains, such as whole-wheat pasta, brown  rice, or whole-grain bread. Fill about one fourth of your plate with whole grains. Eat or drink low-fat dairy products, such as skim milk or low-fat yogurt. Avoid fatty cuts of meat, processed or cured meats, and poultry with skin. Fill about one fourth of your plate with lean proteins, such as fish, chicken without skin, beans, eggs, or tofu. Avoid pre-made and processed foods. These tend to be higher in sodium, added sugar, and fat. Reduce your daily sodium intake. Many people with hypertension should eat less than 1,500 mg of sodium a day. Do not drink alcohol if: Your health care provider tells you not to drink. You are pregnant, may be pregnant, or are planning to become pregnant. If you drink alcohol: Limit how much you have to: 0-1 drink a day for women. 0-2 drinks a day for men. Know how much alcohol is in your drink. In the [place] one drink equals one 12 oz bottle of beer (355 mL), one 5 oz glass of wine (148 mL), or one 1 oz glass of hard liquor (44 mL). Lifestyle  Work with your health care provider to maintain a healthy body weight or to lose weight. Ask what an ideal weight is for you. Get at least 30 minutes of exercise that causes your heart to beat faster (aerobic exercise) most days of the week. Activities may include walking, swimming, or biking. Include exercise to strengthen your muscles (resistance exercise), such as Pilates or lifting weights, as part of your weekly exercise routine. Try to do these types of exercises for 30 minutes at least 3 days a week. Do not use any products that contain nicotine or tobacco. These products include cigarettes, chewing tobacco, and vaping devices, such as e-cigarettes. If you need help quitting, ask your health care provider. Monitor your blood pressure at home as told by your health care provider. Keep all follow-up visits. This is important. Medicines Take over-the-counter and prescription medicines only as told by your health care  provider. Follow directions carefully. Blood pressure medicines must be taken as prescribed. Do not skip doses of blood pressure medicine. Doing this puts you at risk for problems and can make the medicine less effective. Ask your health care provider about side effects or reactions to medicines that you should watch for. Contact a health care provider if you: Think you are having a reaction to a medicine you are taking. Have headaches that keep coming back (recurring). Feel dizzy. Have swelling in your ankles. Have trouble with your vision. Get help right away if you: Develop a severe headache or confusion. Have unusual weakness or numbness. Feel faint. Have severe pain in your chest or abdomen. Vomit repeatedly. Have trouble breathing. These symptoms may be an emergency. Get help right away. Call 911. Do not wait to see if the symptoms will go away. Do not drive yourself to the hospital. Summary Hypertension is when the force of blood pumping through your arteries is too strong. If this condition is not controlled, it may put you at risk for serious complications. Your personal target blood pressure may vary depending on your medical conditions, your age, and other factors. For most people, a normal blood pressure is less than 120/80. Hypertension is treated with lifestyle changes, medicines, or a combination of both. Lifestyle  changes include losing weight, eating a healthy, low-sodium diet, exercising more, and limiting alcohol. This information is not intended to replace advice given to you by your health care provider. Make sure you discuss any questions you have with your health care provider. Document Revised: 05/23/2021 Document Reviewed: 05/23/2021 Elsevier Patient Education  [place].

## 2022-05-28 NOTE — Assessment & Plan Note (Signed)
He and his wife had COVID in September. Is encouraged to wait 2 months and then proceed with booster in preparation of the upcoming holidays

## 2022-05-28 NOTE — Progress Notes (Signed)
Subjective:   By signing my name below, I, Shehryar Baig, attest that this documentation has been prepared under the direction and in the presence of [name], MD 05/28/2022     Patient ID: Kyle Long, male    DOB: 1946-02-20, 76 y.o.   MRN: [id number]  Chief Complaint  Patient presents with   Follow-up    Here for Medicare Annual Review    HPI Patient is in today for a follow up visit.   He reports getting up around 5-6 times every night to urinate. His symptoms have steadily worsened over that past couple of years but is now consistently waking up 5-6 times every night.He denies and burning pain or blood while urinating. He has not seen a urologist to manage his symptoms. He has a enlarged prostate and chronic kidney disease. He continues following up with his nephrologist regularly.  He reports hearing a whistle in his throat while laying down and breathing. He found his symptoms resolved after he started taking vitamin B12 supplements regularly. He has a history of smoking 40 years ago but has not smoked since. He continues following up with a dermatologist regularly.  He reports recovering from Covid-19 recently. He has recovered from his symptoms at this time. He is interested in receiving the Covid-19 vaccine after it has been at least 2 months after he recovered. He is interested in receiving the RSV vaccine at a later date. He is interested in receiving the flu vaccine during this visit. He is due for a tetanus vaccine next year.  He continues following up with Dr. Inda Merlin to manage his history of cancer. He has a low grade smoldering lymphoma and reports no new issues.  His blood pressure has improved since last visit.  BP Readings from Last 3 Encounters:  05/28/22 136/74  02/19/22 (!) 157/70  09/20/21 139/79    Pulse Readings from Last 3 Encounters:  05/28/22 83  02/19/22 79  09/20/21 66   He weight is stable.  Wt Readings from Last 3 Encounters:   05/28/22 230 lb (104.3 kg)  02/19/22 233 lb 9.6 oz (106 kg)  09/20/21 234 lb 12.8 oz (106.5 kg)   He no longer receives regular colonoscopy screenings due to age.  He reports his 48- year old sister has a history of multiple myeloma. He has one brother who is healthy. He has 2 daughters and both are healthy. He has 1 son and he is healthy. Otherwise he has no changes to his family medical history.   He is managing a healthy diet. He keeps himself well hydrated daily.    Past Medical History:  Diagnosis Date   Allergic state 06/26/2015   Amnesia 2009   isolated;for 30 mins w/elevated BP   Arthritis    Cancer ([place])    skin / top head   Cataract    bilateral repair   Cellulitis 2011   LUE (no PMH of MRSA)/left elbow   Chronic kidney disease    resolved   Diverticulosis    Diverticulosis of colon    GERD (gastroesophageal reflux disease)    OCC HEARTBURN   Gilbert's syndrome    Hematuria 2009   Dr Burnell Blanks   Hyperglycemia    Hyperlipidemia    Hypertension    Lung cancer, lower lobe (HCC)    Stage IIA (T1,N1)   Lymphoma (HCC)    Low grad   Neuromuscular disorder ([place])    2010 TEMPORARY MEMORY LOSS 1-1.5 HR WAS  CHECKED OUT AT [facility] , NOTHING FOUND   Peptic ulcer 1995   Peyronie disease    Preventative health care 12/19/2015   Primary cancer of left lower lobe of lung ([facility]) 11/27/2011   Recurrent upper respiratory infection (URI)    09/2011  BRONCHITIS   SCC (squamous cell carcinoma) 01/21/2009   Qualifier: Diagnosis of  By: Linna Darner MD, Gwyndolyn Saxon   ? Basal Cell      Past Surgical History:  Procedure Laterality Date   chemo treatment      4 treatments that last 7 hours each/last treatmebt in 2013   COLONOSCOPY  2015   COLONOSCOPY W/ POLYPECTOMY  2005   due 2015   CYSTOSCOPY  2009   LOBECTOMY  11/02/2011   Procedure: LOBECTOMY;  Surgeon: Melrose Nakayama, MD;  Location: [facility];  Service: Thoracic;  Laterality: Left;  LEFT LOWER LOBECTOMY   REFRACTIVE SURGERY      Bil   SKIN BIOPSY     4 0r 5   Squamous Cell Cancer Excision  12/13/2016   SUPRACLAVICAL NODE BIOPSY Left 10/15/2014   Procedure: LEFT SUPRACLAVICAL LYMPH NODE BIOPSY;  Surgeon: Melrose Nakayama, MD;  Location: [facility];  Service: Thoracic;  Laterality: Left;  LEFT SUPRACLAVICAL LYMPH NODE BIOPSY   VASECTOMY     VEIN SURGERY     R ankle    Family History  Problem Relation Age of Onset   Drug abuse Mother    Stroke Mother        in 42s   Hypertension Mother    Breast cancer Mother    Neuropathy Mother    Melanoma Father    Heart attack Father 48       stents   Dementia Father    Cancer Sister        multiple myeloma   Hypertension Sister    Thyroid disease Sister        ??   Multiple myeloma Sister    Headache Daughter    Diabetes Paternal Aunt    Stomach cancer Maternal Grandfather 23   Lung cancer Maternal Grandfather    Cancer Paternal Grandmother        lung, nonsmoker   Colon cancer Neg Hx    Colon polyps Neg Hx    Esophageal cancer Neg Hx    Rectal cancer Neg Hx     Social History   Socioeconomic History   Marital status: Married    Spouse name: Not on file   Number of children: Not on file   Years of education: Not on file   Highest education level: Not on file  Occupational History   Occupation: retired  Tobacco Use   Smoking status: Former    Packs/day: 1.00    Years: 15.00    Total pack years: 15.00    Types: Cigarettes    Quit date: 07/31/1979    Years since quitting: 42.8   Smokeless tobacco: Former    Types: Nurse, children's Use: Never used  Substance and Sexual Activity   Alcohol use: Yes    Comment: Daily 3 glasses of vodka   Drug use: No   Sexual activity: Not Currently    Comment: lives with wife, retired from [employer], ran distribution. No dietary restrictions.  Other Topics Concern   Not on file  Social History Narrative   Reg exercise   Social Determinants of Health   Financial Resource Strain: Low Risk   (  03/07/2020)   Overall Financial Resource Strain (CARDIA)    Difficulty of Paying Living Expenses: Not hard at all  Food Insecurity: No Food Insecurity (03/07/2020)   Hunger Vital Sign    Worried About Running Out of Food in the Last Year: Never true    Ran Out of Food in the Last Year: Never true  Transportation Needs: No Transportation Needs (03/07/2020)   PRAPARE - Hydrologist (Medical): No    Lack of Transportation (Non-Medical): No  Physical Activity: Inactive (03/08/2021)   Exercise Vital Sign    Days of Exercise per Week: 0 days    Minutes of Exercise per Session: 0 min  Stress: No Stress Concern Present (03/08/2021)   [place]    Feeling of Stress : Not at all  Social Connections: Moderately Integrated (03/08/2021)   Social Connection and Isolation Panel [NHANES]    Frequency of Communication with Friends and Family: More than three times a week    Frequency of Social Gatherings with Friends and Family: More than three times a week    Attends Religious Services: Never    Marine scientist or Organizations: Yes    Attends Archivist Meetings: 1 to 4 times per year    Marital Status: Married  Human resources officer Violence: Not At Risk (03/08/2021)   Humiliation, Afraid, Rape, and Kick questionnaire    Fear of Current or Ex-Partner: No    Emotionally Abused: No    Physically Abused: No    Sexually Abused: No    Outpatient Medications Prior to Visit  Medication Sig Dispense Refill   chlorthalidone (HYGROTON) 25 MG tablet TAKE 1 TABLET DAILY 90 tablet 1   diltiazem (CARDIZEM CD) 240 MG 24 hr capsule TAKE 1 CAPSULE DAILY 90 capsule 1   losartan (COZAAR) 100 MG tablet Take 0.5 tablets (50 mg total) by mouth daily. 45 tablet 1   metoprolol tartrate (LOPRESSOR) 25 MG tablet TAKE 1 TABLET TWICE A DAY 180 tablet 2   mometasone (ELOCON) 0.1 % cream Apply 1 application topically 2  (two) times daily as needed. Dr Arnoldo Lenis [name]     benzonatate (TESSALON) 100 MG capsule Take 1 capsule (100 mg total) by mouth 3 (three) times daily as needed for cough. (Patient not taking: Reported on 05/28/2022) 30 capsule 0   No facility-administered medications prior to visit.    Allergies  Allergen Reactions   Lisinopril Cough    Review of Systems  Constitutional:  Negative for fever and malaise/fatigue.  HENT:  Negative for congestion.   Eyes:  Negative for blurred vision.  Respiratory:  Negative for shortness of breath.   Cardiovascular:  Negative for chest pain, palpitations and leg swelling.  Gastrointestinal:  Negative for abdominal pain, blood in stool and nausea.  Genitourinary:  Negative for dysuria, frequency and hematuria.       (+)nocturia  Musculoskeletal:  Negative for falls.  Skin:  Negative for rash.  Neurological:  Negative for dizziness, loss of consciousness and headaches.  Endo/Heme/Allergies:  Negative for environmental allergies.  Psychiatric/Behavioral:  Negative for depression. The patient is not nervous/anxious.        Objective:    Physical Exam Constitutional:      General: He is not in acute distress.    Appearance: Normal appearance. He is not ill-appearing or toxic-appearing.  HENT:     Head: Normocephalic and atraumatic.     Right Ear: External  ear normal.     Left Ear: External ear normal.     Nose: Nose normal.     Mouth/Throat:     Mouth: Mucous membranes are moist.  Eyes:     General:        Right eye: No discharge.        Left eye: No discharge.     Extraocular Movements: Extraocular movements intact.     Right eye: No nystagmus.     Left eye: No nystagmus.     Pupils: Pupils are equal, round, and reactive to light.  Cardiovascular:     Rate and Rhythm: Normal rate.  Pulmonary:     Effort: Pulmonary effort is normal.     Breath sounds: No wheezing.  Abdominal:     General: Bowel sounds are  normal.     Palpations: There is no mass.     Tenderness: There is no abdominal tenderness.  Musculoskeletal:     Cervical back: Neck supple.     Right lower leg: No edema.     Left lower leg: No edema.  Lymphadenopathy:     Cervical: No cervical adenopathy.  Skin:    Findings: No rash.  Neurological:     Mental Status: He is alert and oriented to person, place, and time.  Psychiatric:        Behavior: Behavior normal.    BP 136/74 (BP Location: Right Arm, Patient Position: Sitting, Cuff Size: Normal)   Pulse 83   Temp 98 F (36.7 C) (Oral)   Resp 16   Ht _0  (1.651 m)   Wt 230 lb (104.3 kg)   SpO2 98%   BMI 38.27 kg/m  Wt Readings from Last 3 Encounters:  05/28/22 230 lb (104.3 kg)  02/19/22 233 lb 9.6 oz (106 kg)  09/20/21 234 lb 12.8 oz (106.5 kg)    Diabetic Foot Exam - Simple   No data filed    Lab Results  Component Value Date   WBC 9.5 05/28/2022   HGB 15.2 05/28/2022   HCT 44.6 05/28/2022   PLT 220.0 05/28/2022   GLUCOSE 123 (H) 05/28/2022   CHOL 191 05/28/2022   TRIG 193.0 (H) 05/28/2022   HDL 54.50 05/28/2022   LDLDIRECT 95.0 09/20/2021   LDLCALC 98 05/28/2022   ALT 26 05/28/2022   AST 26 05/28/2022   NA 136 05/28/2022   K 3.5 05/28/2022   CL 95 (L) 05/28/2022   CREATININE 1.41 05/28/2022   BUN 25 (H) 05/28/2022   CO2 29 05/28/2022   TSH 1.96 05/28/2022   PSA 2.70 05/28/2022   INR 1.03 10/12/2014   HGBA1C 6.0 05/28/2022    Lab Results  Component Value Date   TSH 1.96 05/28/2022   Lab Results  Component Value Date   WBC 9.5 05/28/2022   HGB 15.2 05/28/2022   HCT 44.6 05/28/2022   MCV 98.8 05/28/2022   PLT 220.0 05/28/2022   Lab Results  Component Value Date   NA 136 05/28/2022   K 3.5 05/28/2022   CHLORIDE 104 02/15/2017   CO2 29 05/28/2022   GLUCOSE 123 (H) 05/28/2022   BUN 25 (H) 05/28/2022   CREATININE 1.41 05/28/2022   BILITOT 1.2 05/28/2022   ALKPHOS 54 05/28/2022   AST 26 05/28/2022   ALT 26 05/28/2022   PROT 7.3  05/28/2022   ALBUMIN 4.5 05/28/2022   CALCIUM 10.0 05/28/2022   ANIONGAP 9 02/16/2022   EGFR 49 (L) 02/15/2017   GFR 47.86 (L) 05/28/2022  Lab Results  Component Value Date   CHOL 191 05/28/2022   Lab Results  Component Value Date   HDL 54.50 05/28/2022   Lab Results  Component Value Date   LDLCALC 98 05/28/2022   Lab Results  Component Value Date   TRIG 193.0 (H) 05/28/2022   Lab Results  Component Value Date   CHOLHDL 4 05/28/2022   Lab Results  Component Value Date   HGBA1C 6.0 05/28/2022       Assessment & Plan:   Problem List Items Addressed This Visit     Hyperlipidemia, mixed    Encourage heart healthy diet such as MIND or DASH diet, increase exercise, avoid trans fats, simple carbohydrates and processed foods, consider a krill or fish or flaxseed oil cap daily.       Relevant Orders   Comprehensive metabolic panel (Completed)   Lipid panel (Completed)   CBC with Differential/Platelet (Completed)   Essential hypertension    Well controlled, no changes to meds. Encouraged heart healthy diet such as the DASH diet and exercise as tolerated.       Relevant Orders   Comprehensive metabolic panel (Completed)   CBC with Differential/Platelet (Completed)   Benign prostatic hyperplasia   Relevant Orders   Ambulatory referral to Urology   PSA (Completed)   Urine Culture   Hyperglycemia    hgba1c acceptable, minimize simple carbs. Increase exercise as tolerated. Given flu shot today      Relevant Orders   HgB A1c (Completed)   CBC with Differential/Platelet (Completed)   Malignant lymphoma, small lymphocytic ([facility])    Following with oncology annually and doing well.       Obesity    Encouraged DASH or MIND diet, decrease po intake and increase exercise as tolerated. Needs 7-8 hours of sleep nightly. Avoid trans fats, eat small, frequent meals every 4-5 hours with lean proteins, complex carbs and healthy fats. Minimize simple carbs, high fat foods and  processed foods      Relevant Orders   TSH (Completed)   Nocturia - Primary   Relevant Orders   Ambulatory referral to Urology   CKD (chronic kidney disease), stage III ([place])    Hydrate and monitor, follows with nephrology      Relevant Orders   Comprehensive metabolic panel (Completed)   PSA (Completed)   Urinalysis (Completed)   History of COVID-19    He and his wife had COVID in September. Is encouraged to wait 2 months and then proceed with booster in preparation of the upcoming holidays      Urinary frequency    Worsening in the setting of bph and ckd, check labs today and referred to urology for further consideration and treatment      Relevant Orders   Urine Culture   Urinalysis (Completed)   Other Visit Diagnoses     Need for influenza vaccination       Relevant Orders   Flu Vaccine QUAD High Dose(Fluad) (Completed)       I am having Kyle Long maintain his mometasone, metoprolol tartrate, diltiazem, chlorthalidone, benzonatate, and losartan.  No orders of the defined types were placed in this encounter.  I, [name], MD, personally preformed the services described in this documentation.  All medical record entries made by the scribe were at my direction and in my presence.  I have reviewed the chart and discharge instructions (if applicable) and agree that the record reflects my personal performance and is accurate and complete. 05/28/2022  I,Shehryar Baig,acting as a Education administrator for [name], MD.,have documented all relevant documentation on the behalf of [name], MD,as directed by  [name], MD while in the presence of [name], MD.   [name], MD

## 2022-05-29 DIAGNOSIS — D485 Neoplasm of uncertain behavior of skin: Secondary | ICD-10-CM | POA: Diagnosis not present

## 2022-05-29 DIAGNOSIS — C44329 Squamous cell carcinoma of skin of other parts of face: Secondary | ICD-10-CM | POA: Diagnosis not present

## 2022-05-29 DIAGNOSIS — L570 Actinic keratosis: Secondary | ICD-10-CM | POA: Diagnosis not present

## 2022-05-29 LAB — URINE CULTURE
MICRO NUMBER:: 14117850
Result:: NO GROWTH
SPECIMEN QUALITY:: ADEQUATE

## 2022-06-01 ENCOUNTER — Encounter: Payer: Self-pay | Admitting: Family Medicine

## 2022-06-07 ENCOUNTER — Other Ambulatory Visit (HOSPITAL_BASED_OUTPATIENT_CLINIC_OR_DEPARTMENT_OTHER): Payer: Self-pay

## 2022-06-07 MED ORDER — AREXVY 120 MCG/0.5ML IM SUSR
INTRAMUSCULAR | 0 refills | Status: DC
  Filled 2022-06-07: qty 1, 1d supply, fill #0

## 2022-07-07 ENCOUNTER — Other Ambulatory Visit: Payer: Self-pay | Admitting: Family Medicine

## 2022-07-09 ENCOUNTER — Other Ambulatory Visit (HOSPITAL_BASED_OUTPATIENT_CLINIC_OR_DEPARTMENT_OTHER): Payer: Self-pay

## 2022-07-09 MED ORDER — COVID-19 MRNA 2023-2024 VACCINE (COMIRNATY) 0.3 ML INJECTION
0.3000 mL | Freq: Once | INTRAMUSCULAR | 0 refills | Status: AC
  Filled 2022-07-09: qty 0.3, 1d supply, fill #0

## 2022-07-10 DIAGNOSIS — L400 Psoriasis vulgaris: Secondary | ICD-10-CM | POA: Diagnosis not present

## 2022-07-10 DIAGNOSIS — L570 Actinic keratosis: Secondary | ICD-10-CM | POA: Diagnosis not present

## 2022-07-10 DIAGNOSIS — L579 Skin changes due to chronic exposure to nonionizing radiation, unspecified: Secondary | ICD-10-CM | POA: Diagnosis not present

## 2022-07-10 DIAGNOSIS — C4401 Basal cell carcinoma of skin of lip: Secondary | ICD-10-CM | POA: Diagnosis not present

## 2022-07-10 DIAGNOSIS — L814 Other melanin hyperpigmentation: Secondary | ICD-10-CM | POA: Diagnosis not present

## 2022-07-10 DIAGNOSIS — D235 Other benign neoplasm of skin of trunk: Secondary | ICD-10-CM | POA: Diagnosis not present

## 2022-07-10 DIAGNOSIS — D225 Melanocytic nevi of trunk: Secondary | ICD-10-CM | POA: Diagnosis not present

## 2022-07-10 DIAGNOSIS — L821 Other seborrheic keratosis: Secondary | ICD-10-CM | POA: Diagnosis not present

## 2022-07-10 DIAGNOSIS — Z85828 Personal history of other malignant neoplasm of skin: Secondary | ICD-10-CM | POA: Diagnosis not present

## 2022-07-10 DIAGNOSIS — D485 Neoplasm of uncertain behavior of skin: Secondary | ICD-10-CM | POA: Diagnosis not present

## 2022-07-27 DIAGNOSIS — H43392 Other vitreous opacities, left eye: Secondary | ICD-10-CM | POA: Diagnosis not present

## 2022-07-27 DIAGNOSIS — H3581 Retinal edema: Secondary | ICD-10-CM | POA: Diagnosis not present

## 2022-07-27 DIAGNOSIS — H43812 Vitreous degeneration, left eye: Secondary | ICD-10-CM | POA: Diagnosis not present

## 2022-07-27 DIAGNOSIS — H59811 Chorioretinal scars after surgery for detachment, right eye: Secondary | ICD-10-CM | POA: Diagnosis not present

## 2022-08-03 DIAGNOSIS — C4401 Basal cell carcinoma of skin of lip: Secondary | ICD-10-CM | POA: Diagnosis not present

## 2022-08-03 DIAGNOSIS — C44329 Squamous cell carcinoma of skin of other parts of face: Secondary | ICD-10-CM | POA: Diagnosis not present

## 2022-08-03 DIAGNOSIS — Z888 Allergy status to other drugs, medicaments and biological substances status: Secondary | ICD-10-CM | POA: Diagnosis not present

## 2022-09-18 ENCOUNTER — Other Ambulatory Visit: Payer: Self-pay | Admitting: Family Medicine

## 2022-09-18 DIAGNOSIS — I10 Essential (primary) hypertension: Secondary | ICD-10-CM

## 2022-10-08 ENCOUNTER — Ambulatory Visit
Admission: RE | Admit: 2022-10-08 | Discharge: 2022-10-08 | Disposition: A | Discharge status: Home / Self Care | Payer: Medicare HMO | Source: Ambulatory Visit | Attending: Family Medicine | Admitting: Family Medicine

## 2022-10-08 VITALS — BP 142/86 | HR 83 | Temp 98.4°F | Resp 18 | Ht 70.5 in | Wt 225.0 lb

## 2022-10-08 DIAGNOSIS — R062 Wheezing: Secondary | ICD-10-CM | POA: Diagnosis not present

## 2022-10-08 DIAGNOSIS — C3432 Malignant neoplasm of lower lobe, left bronchus or lung: Secondary | ICD-10-CM | POA: Diagnosis not present

## 2022-10-08 DIAGNOSIS — J209 Acute bronchitis, unspecified: Secondary | ICD-10-CM | POA: Diagnosis not present

## 2022-10-08 MED ORDER — AMOXICILLIN-POT CLAVULANATE 875-125 MG PO TABS: 1.0000 | ORAL_TABLET | Freq: Two times a day (BID) | ORAL | 0 refills | Status: DC

## 2022-10-08 MED ORDER — HYDROCOD POLI-CHLORPHE POLI ER 10-8 MG/5ML PO SUER: 5.0000 mL | Freq: Two times a day (BID) | ORAL | 0 refills | Status: DC | PRN

## 2022-10-08 MED ORDER — PREDNISONE 20 MG PO TABS: 40.0000 mg | ORAL_TABLET | Freq: Every day | ORAL | 0 refills | Status: DC

## 2022-10-08 NOTE — ED Triage Notes (Signed)
Patient c/o productive clear cough, chest congestion x 7 days.  Denies any fever or nasal drainage.  Patient has taken OTC cough meds.

## 2022-10-08 NOTE — Discharge Instructions (Signed)
Make sure you are drinking lots of water Take the antibiotic 2 times a day Take the prednisone once a day for 5 days I have prescribed cough medicine with hydrocodone.  Do not drive on hydrocodone See your doctor if not improving by next week

## 2022-10-08 NOTE — ED Provider Notes (Signed)
Kyle Long CARE    CSN: [id number] Arrival date & time: 10/08/22  1239      History   Chief Complaint Chief Complaint  Patient presents with   Cough    Entered by patient    HPI Kyle Long is a 77 y.o. male.   HPI  Very pleasant 77 year old gentleman who is here with a worsening cough over the last 7 days.  He started to cough up sputum.  He is feeling tired.  He is having bad coughing spasms and spells.  He has a history of a left lower lobe ectomy for lung cancer.  He is a prior smoker.  He denies any underlying lung disease or asthma.  Past Medical History:  Diagnosis Date   Allergic state 06/26/2015   Amnesia 2009   isolated;for 30 mins w/elevated BP   Arthritis    Cancer ([place])    skin / top head   Cataract    bilateral repair   Cellulitis 2011   LUE (no PMH of MRSA)/left elbow   Chronic kidney disease    resolved   Diverticulosis    Diverticulosis of colon    GERD (gastroesophageal reflux disease)    OCC HEARTBURN   Gilbert's syndrome    Hematuria 2009   Dr Burnell Blanks   Hyperglycemia    Hyperlipidemia    Hypertension    Lung cancer, lower lobe (HCC)    Stage IIA (T1,N1)   Lymphoma ([place])    Low grad   Neuromuscular disorder ([place])    2010 TEMPORARY MEMORY LOSS 1-1.5 HR WAS CHECKED OUT AT [facility] , NOTHING FOUND   Peptic ulcer 1995   Peyronie disease    Preventative health care 12/19/2015   Primary cancer of left lower lobe of lung ([place]) 11/27/2011   Recurrent upper respiratory infection (URI)    09/2011  BRONCHITIS   SCC (squamous cell carcinoma) 01/21/2009   Qualifier: Diagnosis of  By: Linna Darner MD, William   ? Basal Cell      Patient Active Problem List   Diagnosis Date Noted   History of COVID-19 05/28/2022   Urinary frequency 05/28/2022   Dupuytren contracture 02/05/2021   Nocturia 05/24/2019   Psoriasis 05/19/2018   Cataract 05/19/2018   Preventative health care 12/19/2015   Allergic state 06/26/2015   Obesity 01/25/2015    Dyspnea 12/13/2014   Cough 11/11/2014   Malignant lymphoma, small lymphocytic ([place]) 11/09/2014   Tachycardia 09/27/2014   Hyperglycemia 08/12/2014   Nonspecific elevation of levels of transaminase or lactic acid dehydrogenase (LDH) 09/12/2013   CKD (chronic kidney disease), stage III ([place]) 12/08/2012   Primary cancer of left lower lobe of lung ([place]) 11/27/2011   S/P thoracotomy 11/05/2011   Benign prostatic hyperplasia 02/01/2010   DIVERTICULOSIS, COLON 01/21/2009   SCC (squamous cell carcinoma) 01/21/2009   COLONIC POLYPS, HX OF 01/21/2009   Hyperlipidemia, mixed 01/28/2008   Disorder of bilirubin excretion 01/28/2008   Essential hypertension 01/28/2008   Peyronie disease 01/28/2008    Past Surgical History:  Procedure Laterality Date   chemo treatment      4 treatments that last 7 hours each/last treatmebt in 2013   COLONOSCOPY  2015   COLONOSCOPY W/ POLYPECTOMY  2005   due 2015   CYSTOSCOPY  2009   LOBECTOMY  11/02/2011   Procedure: LOBECTOMY;  Surgeon: Melrose Nakayama, MD;  Location: [facility] OR;  Service: Thoracic;  Laterality: Left;  LEFT LOWER LOBECTOMY   REFRACTIVE SURGERY  Bil   SKIN BIOPSY     4 0r 5   Squamous Cell Cancer Excision  12/13/2016   SUPRACLAVICAL NODE BIOPSY Left 10/15/2014   Procedure: LEFT SUPRACLAVICAL LYMPH NODE BIOPSY;  Surgeon: Melrose Nakayama, MD;  Location: [facility];  Service: Thoracic;  Laterality: Left;  LEFT SUPRACLAVICAL LYMPH NODE BIOPSY   VASECTOMY     VEIN SURGERY     R ankle       Home Medications    Prior to Admission medications   Medication Sig Start Date End Date Taking? Authorizing Provider  amoxicillin-clavulanate (AUGMENTIN) 875-125 MG tablet Take 1 tablet by mouth every 12 (twelve) hours. 10/08/22  Yes Raylene Everts, MD  chlorpheniramine-HYDROcodone (TUSSIONEX) 10-8 MG/5ML Take 5 mLs by mouth every 12 (twelve) hours as needed for cough. 10/08/22  Yes Raylene Everts, MD  chlorthalidone (HYGROTON) 25 MG tablet  TAKE 1 TABLET DAILY 09/18/22  Yes [name], MD  diltiazem (CARDIZEM CD) 240 MG 24 hr capsule TAKE 1 CAPSULE DAILY 09/18/22  Yes [name], MD  losartan (COZAAR) 100 MG tablet Take 0.5 tablets (50 mg total) by mouth daily. 05/22/22  Yes [name], MD  metoprolol tartrate (LOPRESSOR) 25 MG tablet TAKE 1 TABLET TWICE A DAY 07/09/22  Yes [name], MD  predniSONE (DELTASONE) 20 MG tablet Take 2 tablets (40 mg total) by mouth daily with breakfast. 10/08/22  Yes Raylene Everts, MD  mometasone (ELOCON) 0.1 % cream Apply 1 application topically 2 (two) times daily as needed. Dr Arnoldo Lenis dermatology [name] 10/04/20   Geri Seminole, MD  RSV vaccine recomb adjuvanted (AREXVY) 120 MCG/0.5ML injection Inject into the muscle. 06/07/22   Carlyle Basques, MD    Family History Family History  Problem Relation Age of Onset   Drug abuse Mother    Stroke Mother        in 36s   Hypertension Mother    Breast cancer Mother    Neuropathy Mother    Melanoma Father    Heart attack Father 72       stents   Dementia Father    Cancer Sister        multiple myeloma   Hypertension Sister    Thyroid disease Sister        ??   Multiple myeloma Sister    Headache Daughter    Diabetes Paternal Aunt    Stomach cancer Maternal Grandfather 72   Lung cancer Maternal Grandfather    Cancer Paternal Grandmother        lung, nonsmoker   Colon cancer Neg Hx    Colon polyps Neg Hx    Esophageal cancer Neg Hx    Rectal cancer Neg Hx     Social History Social History   Tobacco Use   Smoking status: Former    Packs/day: 1.00    Years: 15.00    Total pack years: 15.00    Types: Cigarettes    Quit date: 07/31/1979    Years since quitting: 43.2   Smokeless tobacco: Former    Types: Nurse, children's Use: Never used  Substance Use Topics   Alcohol use: Yes    Comment: Daily 3 glasses of vodka   Drug use: No     Allergies   Lisinopril   Review of  Systems Review of Systems See HPI  Physical Exam Triage Vital Signs ED Triage Vitals  Enc Vitals Group     BP 10/08/22  1321 (!) 142/86     Pulse Rate 10/08/22 1321 83     Resp 10/08/22 1321 18     Temp 10/08/22 1321 98.4 F (36.9 C)     Temp Source 10/08/22 1321 Oral     SpO2 10/08/22 1321 95 %     Weight 10/08/22 1322 225 lb (102.1 kg)     Height 10/08/22 1322 5' 10.5" (1.791 m)     Head Circumference --      Peak Flow --      Pain Score 10/08/22 1322 0     Pain Loc --      Pain Edu? --      Excl. in [place]? --    No data found.  Updated Vital Signs BP (!) 142/86 (BP Location: Right Arm)   Pulse 83   Temp 98.4 F (36.9 C) (Oral)   Resp 18   Ht 5' 10.5" (1.791 m)   Wt 102.1 kg   SpO2 95%   BMI 31.83 kg/m      Physical Exam Constitutional:      General: He is not in acute distress.    Appearance: Normal appearance. He is well-developed and normal weight.  HENT:     Head: Normocephalic and atraumatic.     Right Ear: Tympanic membrane and ear canal normal.     Left Ear: Tympanic membrane and ear canal normal.     Nose: Nose normal. No congestion.     Mouth/Throat:     Mouth: Mucous membranes are moist.     Pharynx: No posterior oropharyngeal erythema.  Eyes:     Conjunctiva/sclera: Conjunctivae normal.     Pupils: Pupils are equal, round, and reactive to light.  Cardiovascular:     Rate and Rhythm: Normal rate and regular rhythm.     Heart sounds: Normal heart sounds.  Pulmonary:     Effort: Pulmonary effort is normal. No respiratory distress.     Breath sounds: Wheezing and rhonchi present.     Comments: Diminished lung sounds at left base.  Otherwise wheeze and rhonchi throughout Abdominal:     General: There is no distension.     Palpations: Abdomen is soft.  Musculoskeletal:        General: Normal range of motion.     Cervical back: Normal range of motion.  Skin:    General: Skin is warm and dry.  Neurological:     Mental Status: He is alert.       UC Treatments / Results  Labs (all labs ordered are listed, but only abnormal results are displayed) Labs Reviewed - No data to display  EKG   Radiology No results found.  Procedures Procedures (including critical care time)  Medications Ordered in UC Medications - No data to display  Initial Impression / Assessment and Plan / UC Course  I have reviewed the triage vital signs and the nursing notes.  Pertinent labs & imaging results that were available during my care of the patient were reviewed by me and considered in my medical decision making (see chart for details).     Final Clinical Impressions(s) / UC Diagnoses   Final diagnoses:  Acute bronchitis, unspecified organism  Primary cancer of left lower lobe of lung ([place])  Wheezing     Discharge Instructions      Make sure you are drinking lots of water Take the antibiotic 2 times a day Take the prednisone once a day for 5 days I have prescribed cough  medicine with hydrocodone.  Do not drive on hydrocodone See your doctor if not improving by next week    ED Prescriptions     Medication Sig Dispense Auth. Provider   chlorpheniramine-HYDROcodone (TUSSIONEX) 10-8 MG/5ML Take 5 mLs by mouth every 12 (twelve) hours as needed for cough. 115 mL Raylene Everts, MD   amoxicillin-clavulanate (AUGMENTIN) 875-125 MG tablet Take 1 tablet by mouth every 12 (twelve) hours. 14 tablet Raylene Everts, MD   predniSONE (DELTASONE) 20 MG tablet Take 2 tablets (40 mg total) by mouth daily with breakfast. 10 tablet Raylene Everts, MD      PDMP not reviewed this encounter.   Raylene Everts, MD 10/08/22 1352

## 2022-11-12 ENCOUNTER — Other Ambulatory Visit: Payer: Self-pay | Admitting: Family Medicine

## 2022-11-27 ENCOUNTER — Encounter: Payer: Self-pay | Admitting: Family Medicine

## 2022-11-27 ENCOUNTER — Ambulatory Visit (INDEPENDENT_AMBULATORY_CARE_PROVIDER_SITE_OTHER): Payer: Medicare HMO | Admitting: Family Medicine

## 2022-11-27 VITALS — BP 136/78 | HR 72 | Temp 97.5°F | Resp 16 | Ht 70.0 in | Wt 228.6 lb

## 2022-11-27 DIAGNOSIS — I10 Essential (primary) hypertension: Secondary | ICD-10-CM | POA: Diagnosis not present

## 2022-11-27 DIAGNOSIS — E782 Mixed hyperlipidemia: Secondary | ICD-10-CM

## 2022-11-27 DIAGNOSIS — C8300 Small cell B-cell lymphoma, unspecified site: Secondary | ICD-10-CM | POA: Diagnosis not present

## 2022-11-27 DIAGNOSIS — N189 Chronic kidney disease, unspecified: Secondary | ICD-10-CM

## 2022-11-27 LAB — CBC WITH DIFFERENTIAL/PLATELET
Basophils Absolute: 0 10*3/uL (ref 0.0–0.1)
Basophils Relative: 0.4 % (ref 0.0–3.0)
Eosinophils Absolute: 0.1 10*3/uL (ref 0.0–0.7)
Eosinophils Relative: 0.7 % (ref 0.0–5.0)
HCT: 41.5 % (ref 39.0–52.0)
Hemoglobin: 14.5 g/dL (ref 13.0–17.0)
Lymphocytes Relative: 34.1 % (ref 12.0–46.0)
Lymphs Abs: 3.7 10*3/uL (ref 0.7–4.0)
MCHC: 34.9 g/dL (ref 30.0–36.0)
MCV: 96.6 fl (ref 78.0–100.0)
Monocytes Absolute: 1 10*3/uL (ref 0.1–1.0)
Monocytes Relative: 9.2 % (ref 3.0–12.0)
Neutro Abs: 6.1 10*3/uL (ref 1.4–7.7)
Neutrophils Relative %: 55.6 % (ref 43.0–77.0)
Platelets: 232 10*3/uL (ref 150.0–400.0)
RBC: 4.29 Mil/uL (ref 4.22–5.81)
RDW: 14.1 % (ref 11.5–15.5)
WBC: 11 10*3/uL — ABNORMAL HIGH (ref 4.0–10.5)

## 2022-11-27 LAB — COMPREHENSIVE METABOLIC PANEL
ALT: 17 U/L (ref 0–53)
AST: 19 U/L (ref 0–37)
Albumin: 4.1 g/dL (ref 3.5–5.2)
Alkaline Phosphatase: 50 U/L (ref 39–117)
BUN: 21 mg/dL (ref 6–23)
CO2: 31 mEq/L (ref 19–32)
Calcium: 9.2 mg/dL (ref 8.4–10.5)
Chloride: 91 mEq/L — ABNORMAL LOW (ref 96–112)
Creatinine, Ser: 1.32 mg/dL (ref 0.40–1.50)
GFR: 51.62 mL/min — ABNORMAL LOW (ref >60.00)
Glucose, Bld: 109 mg/dL — ABNORMAL HIGH (ref 70–99)
Potassium: 3.3 mEq/L — ABNORMAL LOW (ref 3.5–5.1)
Sodium: 133 mEq/L — ABNORMAL LOW (ref 135–145)
Total Bilirubin: 1.8 mg/dL — ABNORMAL HIGH (ref 0.2–1.2)
Total Protein: 6.9 g/dL (ref 6.0–8.3)

## 2022-11-27 LAB — LIPID PANEL
Cholesterol: 160 mg/dL (ref 0–200)
HDL: 45.9 mg/dL (ref >39.00)
NonHDL: 114.35
Total CHOL/HDL Ratio: 3
Triglycerides: 211 mg/dL — ABNORMAL HIGH (ref 0.0–149.0)
VLDL: 42.2 mg/dL — ABNORMAL HIGH (ref 0.0–40.0)

## 2022-11-27 LAB — MICROALBUMIN / CREATININE URINE RATIO
Creatinine,U: 51.6 mg/dL
Microalb Creat Ratio: 1.4 mg/g (ref 0.0–30.0)
Microalb, Ur: <0.7 mg/dL (ref 0.0–1.9)

## 2022-11-27 LAB — TSH: TSH: 2.36 u[IU]/mL (ref 0.35–5.50)

## 2022-11-27 LAB — LDL CHOLESTEROL, DIRECT: Direct LDL: 90 mg/dL

## 2022-11-27 NOTE — Assessment & Plan Note (Signed)
Well controlled, no changes to meds. Encouraged heart healthy diet such as the DASH diet and exercise as tolerated.  °

## 2022-11-27 NOTE — Assessment & Plan Note (Signed)
Encourage heart healthy diet such as MIND or DASH diet, increase exercise, avoid trans fats, simple carbohydrates and processed foods, consider a krill or fish or flaxseed oil cap daily.  °

## 2022-11-27 NOTE — Assessment & Plan Note (Signed)
Continues with oncology for observation

## 2022-11-27 NOTE — Patient Instructions (Addendum)
Spring Covid booster Tetanus booster   Hypertension, Adult High blood pressure (hypertension) is when the force of blood pumping through the arteries is too strong. The arteries are the blood vessels that carry blood from the heart throughout the body. Hypertension forces the heart to work harder to pump blood and may cause arteries to become narrow or stiff. Untreated or uncontrolled hypertension can lead to a heart attack, heart failure, a stroke, kidney disease, and other problems. A blood pressure reading consists of a higher number over a lower number. Ideally, your blood pressure should be below 120/80. The first ("top") number is called the systolic pressure. It is a measure of the pressure in your arteries as your heart beats. The second ("bottom") number is called the diastolic pressure. It is a measure of the pressure in your arteries as the heart relaxes. What are the causes? The exact cause of this condition is not known. There are some conditions that result in high blood pressure. What increases the risk? Certain factors may make you more likely to develop high blood pressure. Some of these risk factors are under your control, including: Smoking. Not getting enough exercise or physical activity. Being overweight. Having too much fat, sugar, calories, or salt (sodium) in your diet. Drinking too much alcohol. Other risk factors include: Having a personal history of heart disease, diabetes, high cholesterol, or kidney disease. Stress. Having a family history of high blood pressure and high cholesterol. Having obstructive sleep apnea. Age. The risk increases with age. What are the signs or symptoms? High blood pressure may not cause symptoms. Very high blood pressure (hypertensive crisis) may cause: Headache. Fast or irregular heartbeats (palpitations). Shortness of breath. Nosebleed. Nausea and vomiting. Vision changes. Severe chest pain, dizziness, and seizures. How is this  diagnosed? This condition is diagnosed by measuring your blood pressure while you are seated, with your arm resting on a flat surface, your legs uncrossed, and your feet flat on the floor. The cuff of the blood pressure monitor will be placed directly against the skin of your upper arm at the level of your heart. Blood pressure should be measured at least twice using the same arm. Certain conditions can cause a difference in blood pressure between your right and left arms. If you have a high blood pressure reading during one visit or you have normal blood pressure with other risk factors, you may be asked to: Return on a different day to have your blood pressure checked again. Monitor your blood pressure at home for 1 week or longer. If you are diagnosed with hypertension, you may have other blood or imaging tests to help your health care provider understand your overall risk for other conditions. How is this treated? This condition is treated by making healthy lifestyle changes, such as eating healthy foods, exercising more, and reducing your alcohol intake. You may be referred for counseling on a healthy diet and physical activity. Your health care provider may prescribe medicine if lifestyle changes are not enough to get your blood pressure under control and if: Your systolic blood pressure is above 130. Your diastolic blood pressure is above 80. Your personal target blood pressure may vary depending on your medical conditions, your age, and other factors. Follow these instructions at home: Eating and drinking  Eat a diet that is high in fiber and potassium, and low in sodium, added sugar, and fat. An example of this eating plan is called the DASH diet. DASH stands for Dietary Approaches to  Stop Hypertension. To eat this way: Eat plenty of fresh fruits and vegetables. Try to fill one half of your plate at each meal with fruits and vegetables. Eat whole grains, such as whole-wheat pasta, brown  rice, or whole-grain bread. Fill about one fourth of your plate with whole grains. Eat or drink low-fat dairy products, such as skim milk or low-fat yogurt. Avoid fatty cuts of meat, processed or cured meats, and poultry with skin. Fill about one fourth of your plate with lean proteins, such as fish, chicken without skin, beans, eggs, or tofu. Avoid pre-made and processed foods. These tend to be higher in sodium, added sugar, and fat. Reduce your daily sodium intake. Many people with hypertension should eat less than 1,500 mg of sodium a day. Do not drink alcohol if: Your health care provider tells you not to drink. You are pregnant, may be pregnant, or are planning to become pregnant. If you drink alcohol: Limit how much you have to: 0-1 drink a day for women. 0-2 drinks a day for men. Know how much alcohol is in your drink. In the [place] one drink equals one 12 oz bottle of beer (355 mL), one 5 oz glass of wine (148 mL), or one 1 oz glass of hard liquor (44 mL). Lifestyle  Work with your health care provider to maintain a healthy body weight or to lose weight. Ask what an ideal weight is for you. Get at least 30 minutes of exercise that causes your heart to beat faster (aerobic exercise) most days of the week. Activities may include walking, swimming, or biking. Include exercise to strengthen your muscles (resistance exercise), such as Pilates or lifting weights, as part of your weekly exercise routine. Try to do these types of exercises for 30 minutes at least 3 days a week. Do not use any products that contain nicotine or tobacco. These products include cigarettes, chewing tobacco, and vaping devices, such as e-cigarettes. If you need help quitting, ask your health care provider. Monitor your blood pressure at home as told by your health care provider. Keep all follow-up visits. This is important. Medicines Take over-the-counter and prescription medicines only as told by your health care  provider. Follow directions carefully. Blood pressure medicines must be taken as prescribed. Do not skip doses of blood pressure medicine. Doing this puts you at risk for problems and can make the medicine less effective. Ask your health care provider about side effects or reactions to medicines that you should watch for. Contact a health care provider if you: Think you are having a reaction to a medicine you are taking. Have headaches that keep coming back (recurring). Feel dizzy. Have swelling in your ankles. Have trouble with your vision. Get help right away if you: Develop a severe headache or confusion. Have unusual weakness or numbness. Feel faint. Have severe pain in your chest or abdomen. Vomit repeatedly. Have trouble breathing. These symptoms may be an emergency. Get help right away. Call 911. Do not wait to see if the symptoms will go away. Do not drive yourself to the hospital. Summary Hypertension is when the force of blood pumping through your arteries is too strong. If this condition is not controlled, it may put you at risk for serious complications. Your personal target blood pressure may vary depending on your medical conditions, your age, and other factors. For most people, a normal blood pressure is less than 120/80. Hypertension is treated with lifestyle changes, medicines, or a combination of both. Lifestyle  changes include losing weight, eating a healthy, low-sodium diet, exercising more, and limiting alcohol. This information is not intended to replace advice given to you by your health care provider. Make sure you discuss any questions you have with your health care provider. Document Revised: 05/23/2021 Document Reviewed: 05/23/2021 Elsevier Patient Education  [place].

## 2022-11-27 NOTE — Progress Notes (Signed)
Subjective:   By signing my name below, I, Kyle Long, attest that this documentation has been prepared under the direction and in the presence of Kyle Canary, MD., 11/27/2022.   Patient ID: Kyle Long, male    DOB: Sep 17, 1945, 77 y.o.   MRN: [id number]  No chief complaint on file.  HPI Patient is in today for an office visit.   Essential Hypertension Patient's blood pressure and pulse are normal today and his hypertension continues to be monitored. He denies CP/palpitations/SOB/HA/fever/chills/GI symptoms. BP Readings from Last 3 Encounters:  11/27/22 136/78  10/08/22 (!) 142/86  05/28/22 136/74   Pulse Readings from Last 3 Encounters:  11/27/22 72  10/08/22 83  05/28/22 83   Malignant Neoplasms of Lung Patient continues to follow with oncology to manage the small lymphocytic lymphoma involving the left supraclavicular lymph nodes which was diagnosed in 08/2014.  Urinary Frequency Patient reports that he has been experiencing urinary frequency for the past several months but has an appointment at [facility] on 12/05/2022. He denies burning, dysuria, or hematuria.  Supplements Patient continues to take vitamins B3 and B12 daily.  Past Medical History:  Diagnosis Date   Allergic state 06/26/2015   Amnesia 2009   isolated;for 30 mins w/elevated BP   Arthritis    Cancer (HCC)    skin / top head   Cataract    bilateral repair   Cellulitis 2011   LUE (no PMH of MRSA)/left elbow   Chronic kidney disease    resolved   Diverticulosis    Diverticulosis of colon    GERD (gastroesophageal reflux disease)    OCC HEARTBURN   Gilbert's syndrome    Hematuria 2009   Dr Merry Lofty   Hyperglycemia    Hyperlipidemia    Hypertension    Lung cancer, lower lobe (HCC)    Stage IIA (T1,N1)   Lymphoma (HCC)    Low grad   Neuromuscular disorder (HCC)    2010 TEMPORARY MEMORY LOSS 1-1.5 HR WAS CHECKED OUT AT [facility] , NOTHING FOUND   Peptic ulcer 1995    Peyronie disease    Preventative health care 12/19/2015   Primary cancer of left lower lobe of lung (HCC) 11/27/2011   Recurrent upper respiratory infection (URI)    09/2011  BRONCHITIS   SCC (squamous cell carcinoma) 01/21/2009   Qualifier: Diagnosis of  By: Alwyn Ren MD, William   ? Basal Cell      Past Surgical History:  Procedure Laterality Date   chemo treatment      4 treatments that last 7 hours each/last treatmebt in 2013   COLONOSCOPY  2015   COLONOSCOPY W/ POLYPECTOMY  2005   due 2015   CYSTOSCOPY  2009   LOBECTOMY  11/02/2011   Procedure: LOBECTOMY;  Surgeon: Loreli Slot, MD;  Location: [facility] OR;  Service: Thoracic;  Laterality: Left;  LEFT LOWER LOBECTOMY   REFRACTIVE SURGERY     Bil   SKIN BIOPSY     4 0r 5   Squamous Cell Cancer Excision  12/13/2016   SUPRACLAVICAL NODE BIOPSY Left 10/15/2014   Procedure: LEFT SUPRACLAVICAL LYMPH NODE BIOPSY;  Surgeon: Loreli Slot, MD;  Location: [facility] OR;  Service: Thoracic;  Laterality: Left;  LEFT SUPRACLAVICAL LYMPH NODE BIOPSY   VASECTOMY     VEIN SURGERY     R ankle    Family History  Problem Relation Age of Onset   Drug abuse Mother    Stroke Mother  in 33s   Hypertension Mother    Breast cancer Mother    Neuropathy Mother    Melanoma Father    Heart attack Father 88       stents   Dementia Father    Cancer Sister        multiple myeloma   Hypertension Sister    Thyroid disease Sister        ??   Multiple myeloma Sister    Headache Daughter    Diabetes Paternal Aunt    Stomach cancer Maternal Grandfather 38   Lung cancer Maternal Grandfather    Cancer Paternal Grandmother        lung, nonsmoker   Colon cancer Neg Hx    Colon polyps Neg Hx    Esophageal cancer Neg Hx    Rectal cancer Neg Hx     Social History   Socioeconomic History   Marital status: Married    Spouse name: Not on file   Number of children: Not on file   Years of education: Not on file   Highest education level: Not on  file  Occupational History   Occupation: retired  Tobacco Use   Smoking status: Former    Packs/day: 1.00    Years: 15.00    Additional pack years: 0.00    Total pack years: 15.00    Types: Cigarettes    Quit date: 07/31/1979    Years since quitting: 43.3   Smokeless tobacco: Former    Types: Associate Professor Use: Never used  Substance and Sexual Activity   Alcohol use: Yes    Comment: Daily 3 glasses of vodka   Drug use: No   Sexual activity: Not Currently    Comment: lives with wife, retired from [employer], ran distribution. No dietary restrictions.  Other Topics Concern   Not on file  Social History Narrative   Reg exercise   Social Determinants of Health   Financial Resource Strain: Low Risk  (03/07/2020)   Overall Financial Resource Strain (CARDIA)    Difficulty of Paying Living Expenses: Not hard at all  Food Insecurity: No Food Insecurity (03/07/2020)   Hunger Vital Sign    Worried About Running Out of Food in the Last Year: Never true    Ran Out of Food in the Last Year: Never true  Transportation Needs: No Transportation Needs (03/07/2020)   PRAPARE - Administrator, Civil Service (Medical): No    Lack of Transportation (Non-Medical): No  Physical Activity: Inactive (03/08/2021)   Exercise Vital Sign    Days of Exercise per Week: 0 days    Minutes of Exercise per Session: 0 min  Stress: No Stress Concern Present (03/08/2021)   [employer] of Occupational Health - Occupational Stress Questionnaire    Feeling of Stress : Not at all  Social Connections: Moderately Integrated (03/08/2021)   Social Connection and Isolation Panel [NHANES]    Frequency of Communication with Friends and Family: More than three times a week    Frequency of Social Gatherings with Friends and Family: More than three times a week    Attends Religious Services: Never    Database administrator or Organizations: Yes    Attends Banker Meetings: 1 to 4  times per year    Marital Status: Married  Catering manager Violence: Not At Risk (03/08/2021)   Humiliation, Afraid, Rape, and Kick questionnaire    Fear of Current or  Ex-Partner: No    Emotionally Abused: No    Physically Abused: No    Sexually Abused: No    Outpatient Medications Prior to Visit  Medication Sig Dispense Refill   amoxicillin-clavulanate (AUGMENTIN) 875-125 MG tablet Take 1 tablet by mouth every 12 (twelve) hours. 14 tablet 0   chlorpheniramine-HYDROcodone (TUSSIONEX) 10-8 MG/5ML Take 5 mLs by mouth every 12 (twelve) hours as needed for cough. 115 mL 0   chlorthalidone (HYGROTON) 25 MG tablet TAKE 1 TABLET DAILY 90 tablet 1   diltiazem (CARDIZEM CD) 240 MG 24 hr capsule TAKE 1 CAPSULE DAILY 90 capsule 1   losartan (COZAAR) 100 MG tablet TAKE 1/2 TABLET DAILY 45 tablet 1   metoprolol tartrate (LOPRESSOR) 25 MG tablet TAKE 1 TABLET TWICE A DAY 180 tablet 2   mometasone (ELOCON) 0.1 % cream Apply 1 application topically 2 (two) times daily as needed. Dr Eilleen Kempf dermatology [facility]     predniSONE (DELTASONE) 20 MG tablet Take 2 tablets (40 mg total) by mouth daily with breakfast. 10 tablet 0   RSV vaccine recomb adjuvanted (AREXVY) 120 MCG/0.5ML injection Inject into the muscle. 1 each 0   No facility-administered medications prior to visit.    Allergies  Allergen Reactions   Lisinopril Cough    Review of Systems  Constitutional:  Negative for chills and fever.  Respiratory:  Negative for shortness of breath.   Cardiovascular:  Negative for chest pain and palpitations.  Gastrointestinal:  Negative for abdominal pain, blood in stool, constipation, diarrhea, nausea and vomiting.  Genitourinary:  Positive for frequency. Negative for dysuria, hematuria and urgency.  Skin:           Neurological:  Negative for headaches.       Objective:    Physical Exam Constitutional:      General: He is not in acute distress.    Appearance: Normal appearance.  He is not ill-appearing.  HENT:     Head: Normocephalic and atraumatic.     Right Ear: External ear normal.     Left Ear: External ear normal.     Nose: Nose normal.     Mouth/Throat:     Mouth: Mucous membranes are moist.     Pharynx: Oropharynx is clear.  Eyes:     General:        Right eye: No discharge.        Left eye: No discharge.     Extraocular Movements: Extraocular movements intact.     Conjunctiva/sclera: Conjunctivae normal.     Pupils: Pupils are equal, round, and reactive to light.  Cardiovascular:     Rate and Rhythm: Normal rate and regular rhythm.     Pulses: Normal pulses.     Heart sounds: Normal heart sounds. No murmur heard.    No gallop.  Pulmonary:     Effort: Pulmonary effort is normal. No respiratory distress.     Breath sounds: Normal breath sounds. No wheezing or rales.  Abdominal:     General: Bowel sounds are normal.     Palpations: Abdomen is soft.     Tenderness: There is no abdominal tenderness. There is no guarding.  Musculoskeletal:        General: Normal range of motion.     Cervical back: Normal range of motion.     Right lower leg: No edema.     Left lower leg: No edema.  Skin:    General: Skin is warm and dry.  Neurological:  Mental Status: He is alert and oriented to person, place, and time.  Psychiatric:        Mood and Affect: Mood normal.        Behavior: Behavior normal.        Judgment: Judgment normal.     There were no vitals taken for this visit. Wt Readings from Last 3 Encounters:  10/08/22 225 lb (102.1 kg)  05/28/22 230 lb (104.3 kg)  02/19/22 233 lb 9.6 oz (106 kg)    Diabetic Foot Exam - Simple   No data filed    Lab Results  Component Value Date   WBC 9.5 05/28/2022   HGB 15.2 05/28/2022   HCT 44.6 05/28/2022   PLT 220.0 05/28/2022   GLUCOSE 123 (H) 05/28/2022   CHOL 191 05/28/2022   TRIG 193.0 (H) 05/28/2022   HDL 54.50 05/28/2022   LDLDIRECT 95.0 09/20/2021   LDLCALC 98 05/28/2022   ALT 26  05/28/2022   AST 26 05/28/2022   NA 136 05/28/2022   K 3.5 05/28/2022   CL 95 (L) 05/28/2022   CREATININE 1.41 05/28/2022   BUN 25 (H) 05/28/2022   CO2 29 05/28/2022   TSH 1.96 05/28/2022   PSA 2.70 05/28/2022   INR 1.03 10/12/2014   HGBA1C 6.0 05/28/2022    Lab Results  Component Value Date   TSH 1.96 05/28/2022   Lab Results  Component Value Date   WBC 9.5 05/28/2022   HGB 15.2 05/28/2022   HCT 44.6 05/28/2022   MCV 98.8 05/28/2022   PLT 220.0 05/28/2022   Lab Results  Component Value Date   NA 136 05/28/2022   K 3.5 05/28/2022   CHLORIDE 104 02/15/2017   CO2 29 05/28/2022   GLUCOSE 123 (H) 05/28/2022   BUN 25 (H) 05/28/2022   CREATININE 1.41 05/28/2022   BILITOT 1.2 05/28/2022   ALKPHOS 54 05/28/2022   AST 26 05/28/2022   ALT 26 05/28/2022   PROT 7.3 05/28/2022   ALBUMIN 4.5 05/28/2022   CALCIUM 10.0 05/28/2022   ANIONGAP 9 02/16/2022   EGFR 49 (L) 02/15/2017   GFR 47.86 (L) 05/28/2022   Lab Results  Component Value Date   CHOL 191 05/28/2022   Lab Results  Component Value Date   HDL 54.50 05/28/2022   Lab Results  Component Value Date   LDLCALC 98 05/28/2022   Lab Results  Component Value Date   TRIG 193.0 (H) 05/28/2022   Lab Results  Component Value Date   CHOLHDL 4 05/28/2022   Lab Results  Component Value Date   HGBA1C 6.0 05/28/2022      Assessment & Plan:  Immunizations: Encouraged patient to consider annual COVID-19 and Influenza vaccinations as well as the Tetanus vaccination which was last given in 08/2012.  Labs: Routine blood work ordered.  Essential Hypertension: This is well-controlled and continues to be monitored. There were no medication adjustments today.  Hyperlipidemia: This is well-controlled and continues to be monitored.  Healthy Lifestyle: Encouraged 6-8 hours of sleep, heart healthy diet, 60-80 oz of non-alcohol/non-caffeinated fluids, and minimum of 4000 steps daily. Problem List Items Addressed This Visit        Cardiovascular and Mediastinum   Essential hypertension - Primary     Other   Hyperlipidemia, mixed   Malignant lymphoma, small lymphocytic (HCC)   No orders of the defined types were placed in this encounter.  I, Kyle Long, personally preformed the services described in this documentation.  All medical record entries made by  the scribe were at my direction and in my presence.  I have reviewed the chart and discharge instructions (if applicable) and agree that the record reflects my personal performance and is accurate and complete. 11/27/2022  I,Mohammed Iqbal,acting as a scribe for Danise Edge, MD.,have documented all relevant documentation on the behalf of Danise Edge, MD,as directed by  Danise Edge, MD while in the presence of Danise Edge, MD.  Kyle Long

## 2022-11-28 ENCOUNTER — Other Ambulatory Visit: Payer: Self-pay

## 2022-11-28 DIAGNOSIS — I10 Essential (primary) hypertension: Secondary | ICD-10-CM

## 2022-11-28 MED ORDER — POTASSIUM CHLORIDE CRYS ER 10 MEQ PO TBCR: 10.0000 meq | EXTENDED_RELEASE_TABLET | Freq: Every day | ORAL | 3 refills | Status: DC

## 2022-11-28 NOTE — Assessment & Plan Note (Signed)
Hydrate and monitor 

## 2022-12-05 DIAGNOSIS — N401 Enlarged prostate with lower urinary tract symptoms: Secondary | ICD-10-CM | POA: Diagnosis not present

## 2022-12-05 DIAGNOSIS — R3915 Urgency of urination: Secondary | ICD-10-CM | POA: Diagnosis not present

## 2022-12-05 DIAGNOSIS — R351 Nocturia: Secondary | ICD-10-CM | POA: Diagnosis not present

## 2022-12-13 ENCOUNTER — Other Ambulatory Visit (INDEPENDENT_AMBULATORY_CARE_PROVIDER_SITE_OTHER): Payer: Medicare HMO

## 2022-12-13 ENCOUNTER — Other Ambulatory Visit (HOSPITAL_BASED_OUTPATIENT_CLINIC_OR_DEPARTMENT_OTHER): Payer: Self-pay

## 2022-12-13 DIAGNOSIS — I10 Essential (primary) hypertension: Secondary | ICD-10-CM | POA: Diagnosis not present

## 2022-12-13 LAB — COMPREHENSIVE METABOLIC PANEL
ALT: 25 U/L (ref 0–53)
AST: 26 U/L (ref 0–37)
Albumin: 4.3 g/dL (ref 3.5–5.2)
Alkaline Phosphatase: 50 U/L (ref 39–117)
BUN: 18 mg/dL (ref 6–23)
CO2: 30 mEq/L (ref 19–32)
Calcium: 9.5 mg/dL (ref 8.4–10.5)
Chloride: 95 mEq/L — ABNORMAL LOW (ref 96–112)
Creatinine, Ser: 1.33 mg/dL (ref 0.40–1.50)
GFR: 51.14 mL/min — ABNORMAL LOW (ref >60.00)
Glucose, Bld: 113 mg/dL — ABNORMAL HIGH (ref 70–99)
Potassium: 3.6 mEq/L (ref 3.5–5.1)
Sodium: 136 mEq/L (ref 135–145)
Total Bilirubin: 1 mg/dL (ref 0.2–1.2)
Total Protein: 7 g/dL (ref 6.0–8.3)

## 2022-12-13 MED ORDER — BOOSTRIX 5-2.5-18.5 LF-MCG/0.5 IM SUSY
0.5000 mL | PREFILLED_SYRINGE | Freq: Once | INTRAMUSCULAR | 0 refills | Status: AC
  Filled 2022-12-13: qty 0.5, 1d supply, fill #0

## 2023-01-15 DIAGNOSIS — L814 Other melanin hyperpigmentation: Secondary | ICD-10-CM | POA: Diagnosis not present

## 2023-01-15 DIAGNOSIS — L579 Skin changes due to chronic exposure to nonionizing radiation, unspecified: Secondary | ICD-10-CM | POA: Diagnosis not present

## 2023-01-15 DIAGNOSIS — L565 Disseminated superficial actinic porokeratosis (DSAP): Secondary | ICD-10-CM | POA: Diagnosis not present

## 2023-01-15 DIAGNOSIS — L400 Psoriasis vulgaris: Secondary | ICD-10-CM | POA: Diagnosis not present

## 2023-01-15 DIAGNOSIS — L820 Inflamed seborrheic keratosis: Secondary | ICD-10-CM | POA: Diagnosis not present

## 2023-01-15 DIAGNOSIS — Z85828 Personal history of other malignant neoplasm of skin: Secondary | ICD-10-CM | POA: Diagnosis not present

## 2023-01-15 DIAGNOSIS — L821 Other seborrheic keratosis: Secondary | ICD-10-CM | POA: Diagnosis not present

## 2023-01-15 DIAGNOSIS — L570 Actinic keratosis: Secondary | ICD-10-CM | POA: Diagnosis not present

## 2023-02-13 DIAGNOSIS — H43812 Vitreous degeneration, left eye: Secondary | ICD-10-CM | POA: Diagnosis not present

## 2023-02-13 DIAGNOSIS — H3581 Retinal edema: Secondary | ICD-10-CM | POA: Diagnosis not present

## 2023-02-13 DIAGNOSIS — H43392 Other vitreous opacities, left eye: Secondary | ICD-10-CM | POA: Diagnosis not present

## 2023-02-13 DIAGNOSIS — H59811 Chorioretinal scars after surgery for detachment, right eye: Secondary | ICD-10-CM | POA: Diagnosis not present

## 2023-02-13 DIAGNOSIS — H04123 Dry eye syndrome of bilateral lacrimal glands: Secondary | ICD-10-CM | POA: Diagnosis not present

## 2023-02-20 ENCOUNTER — Ambulatory Visit (HOSPITAL_COMMUNITY)
Admission: RE | Admit: 2023-02-20 | Discharge: 2023-02-20 | Disposition: A | Discharge status: Home / Self Care | Payer: Medicare HMO | Source: Ambulatory Visit | Attending: Internal Medicine | Admitting: Internal Medicine

## 2023-02-20 ENCOUNTER — Inpatient Hospital Stay: Payer: Medicare HMO | Attending: Internal Medicine

## 2023-02-20 ENCOUNTER — Other Ambulatory Visit: Payer: Self-pay

## 2023-02-20 ENCOUNTER — Encounter (HOSPITAL_COMMUNITY): Payer: Self-pay

## 2023-02-20 DIAGNOSIS — K760 Fatty (change of) liver, not elsewhere classified: Secondary | ICD-10-CM | POA: Diagnosis not present

## 2023-02-20 DIAGNOSIS — C8300 Small cell B-cell lymphoma, unspecified site: Secondary | ICD-10-CM | POA: Diagnosis not present

## 2023-02-20 DIAGNOSIS — R590 Localized enlarged lymph nodes: Secondary | ICD-10-CM | POA: Insufficient documentation

## 2023-02-20 DIAGNOSIS — Z85118 Personal history of other malignant neoplasm of bronchus and lung: Secondary | ICD-10-CM | POA: Insufficient documentation

## 2023-02-20 DIAGNOSIS — C3490 Malignant neoplasm of unspecified part of unspecified bronchus or lung: Secondary | ICD-10-CM | POA: Insufficient documentation

## 2023-02-20 LAB — CBC WITH DIFFERENTIAL (CANCER CENTER ONLY)
Abs Immature Granulocytes: 0.08 10*3/uL — ABNORMAL HIGH (ref 0.00–0.07)
Basophils Absolute: 0.1 10*3/uL (ref 0.0–0.1)
Basophils Relative: 1 %
Eosinophils Absolute: 0.1 10*3/uL (ref 0.0–0.5)
Eosinophils Relative: 0 %
HCT: 44.9 % (ref 39.0–52.0)
Hemoglobin: 16.1 g/dL (ref 13.0–17.0)
Immature Granulocytes: 1 %
Lymphocytes Relative: 32 %
Lymphs Abs: 4.8 10*3/uL — ABNORMAL HIGH (ref 0.7–4.0)
MCH: 33.8 pg (ref 26.0–34.0)
MCHC: 35.9 g/dL (ref 30.0–36.0)
MCV: 94.3 fL (ref 80.0–100.0)
Monocytes Absolute: 1.2 10*3/uL — ABNORMAL HIGH (ref 0.1–1.0)
Monocytes Relative: 8 %
Neutro Abs: 8.7 10*3/uL — ABNORMAL HIGH (ref 1.7–7.7)
Neutrophils Relative %: 58 %
Platelet Count: 221 10*3/uL (ref 150–400)
RBC: 4.76 MIL/uL (ref 4.22–5.81)
RDW: 12.2 % (ref 11.5–15.5)
Smear Review: NORMAL
WBC Count: 14.9 10*3/uL — ABNORMAL HIGH (ref 4.0–10.5)
nRBC: 0 % (ref 0.0–0.2)

## 2023-02-20 LAB — CMP (CANCER CENTER ONLY)
ALT: 27 U/L (ref 0–44)
AST: 25 U/L (ref 15–41)
Albumin: 4.3 g/dL (ref 3.5–5.0)
Alkaline Phosphatase: 60 U/L (ref 38–126)
Anion gap: 9 (ref 5–15)
BUN: 19 mg/dL (ref 8–23)
CO2: 30 mmol/L (ref 22–32)
Calcium: 9.9 mg/dL (ref 8.9–10.3)
Chloride: 96 mmol/L — ABNORMAL LOW (ref 98–111)
Creatinine: 1.39 mg/dL — ABNORMAL HIGH (ref 0.61–1.24)
GFR, Estimated: 52 mL/min — ABNORMAL LOW (ref >60)
Glucose, Bld: 128 mg/dL — ABNORMAL HIGH (ref 70–99)
Potassium: 3.7 mmol/L (ref 3.5–5.1)
Sodium: 135 mmol/L (ref 135–145)
Total Bilirubin: 1.3 mg/dL — ABNORMAL HIGH (ref 0.3–1.2)
Total Protein: 7.2 g/dL (ref 6.5–8.1)

## 2023-02-20 LAB — LACTATE DEHYDROGENASE: LDH: 134 U/L (ref 98–192)

## 2023-02-25 ENCOUNTER — Other Ambulatory Visit: Payer: Self-pay

## 2023-02-25 ENCOUNTER — Inpatient Hospital Stay: Payer: Medicare HMO | Admitting: Internal Medicine

## 2023-02-25 VITALS — BP 120/66 | HR 82 | Temp 97.8°F | Resp 17 | Ht 70.0 in | Wt 228.2 lb

## 2023-02-25 DIAGNOSIS — Z85118 Personal history of other malignant neoplasm of bronchus and lung: Secondary | ICD-10-CM | POA: Diagnosis not present

## 2023-02-25 DIAGNOSIS — C3490 Malignant neoplasm of unspecified part of unspecified bronchus or lung: Secondary | ICD-10-CM | POA: Diagnosis not present

## 2023-02-25 DIAGNOSIS — C8300 Small cell B-cell lymphoma, unspecified site: Secondary | ICD-10-CM | POA: Diagnosis not present

## 2023-02-25 DIAGNOSIS — R590 Localized enlarged lymph nodes: Secondary | ICD-10-CM | POA: Diagnosis not present

## 2023-02-25 NOTE — Progress Notes (Signed)
[facility] [phone]   [phone]  OFFICE PROGRESS NOTE  [name], MD [address] [place] [address]  DIAGNOSIS AND STAGE:  1) small lymphocytic lymphoma involving the left supraclavicular lymph nodes diagnosed in February 2016. 2) Stage IIA (T1a, N1, M0) non-small cell lung cancer consistent with adenocarcinoma with negative EGFR mutation diagnosed in March of 2013.    PRIOR THERAPY:   1) S/P left video-assisted thoracoscopy, wedge resection of left lower lobe nodule, thoracoscopic left lower lobectomy, mediastinal lymph node dissection on 11/02/2011.   2) Adjuvant chemotherapy with cisplatin 75 mg/M2 and Alimta 500 mg/M2 every 3 weeks. She is status post 4 cycles.    CURRENT THERAPY: Observation.  INTERVAL HISTORY: Kyle Long 77 y.o. male returns to clinic today for follow-up visit accompanied by his wife.  The patient is feeling fine today with no concerning complaints.  He denied having any fatigue or weakness.  He has no weight loss or night sweats.  He has no palpable lymphadenopathy.  He has no chest pain, shortness of breath, cough or hemoptysis.  He has no nausea, vomiting, diarrhea or constipation.  He is here today for evaluation with repeat CT scan of the chest, abdomen and pelvis for restaging of his disease.  MEDICAL HISTORY: Past Medical History:  Diagnosis Date   Allergic state 06/26/2015   Amnesia 2009   isolated;for 30 mins w/elevated BP   Arthritis    Cancer (HCC)    skin / top head   Cataract    bilateral repair   Cellulitis 2011   LUE (no PMH of MRSA)/left elbow   Chronic kidney disease    resolved   Diverticulosis    Diverticulosis of colon    GERD (gastroesophageal reflux disease)    OCC HEARTBURN   Gilbert's syndrome    Hematuria 2009   Dr Merry Lofty   Hyperglycemia    Hyperlipidemia    Hypertension    Lung cancer, lower lobe (HCC)    Stage IIA (T1,N1)   Lymphoma (HCC)     Low grad   Neuromuscular disorder (HCC)    2010 TEMPORARY MEMORY LOSS 1-1.5 HR WAS CHECKED OUT AT [facility] , NOTHING FOUND   Peptic ulcer 1995   Peyronie disease    Preventative health care 12/19/2015   Primary cancer of left lower lobe of lung (HCC) 11/27/2011   Recurrent upper respiratory infection (URI)    09/2011  BRONCHITIS   SCC (squamous cell carcinoma) 01/21/2009   Qualifier: Diagnosis of  By: Alwyn Ren MD, William   ? Basal Cell      ALLERGIES:  is allergic to lisinopril.  MEDICATIONS:  Current Outpatient Medications  Medication Sig Dispense Refill   amoxicillin-clavulanate (AUGMENTIN) 875-125 MG tablet Take 1 tablet by mouth every 12 (twelve) hours. 14 tablet 0   chlorpheniramine-HYDROcodone (TUSSIONEX) 10-8 MG/5ML Take 5 mLs by mouth every 12 (twelve) hours as needed for cough. 115 mL 0   chlorthalidone (HYGROTON) 25 MG tablet TAKE 1 TABLET DAILY 90 tablet 1   diltiazem (CARDIZEM CD) 240 MG 24 hr capsule TAKE 1 CAPSULE DAILY 90 capsule 1   losartan (COZAAR) 100 MG tablet TAKE 1/2 TABLET DAILY 45 tablet 1   metoprolol tartrate (LOPRESSOR) 25 MG tablet TAKE 1 TABLET TWICE A DAY 180 tablet 2   mometasone (ELOCON) 0.1 % cream Apply 1 application topically 2 (two) times daily as needed. Dr Eilleen Kempf dermatology [facility]  potassium chloride (KLOR-CON M) 10 MEQ tablet Take 1 tablet (10 mEq total) by mouth daily. 30 tablet 3   predniSONE (DELTASONE) 20 MG tablet Take 2 tablets (40 mg total) by mouth daily with breakfast. 10 tablet 0   RSV vaccine recomb adjuvanted (AREXVY) 120 MCG/0.5ML injection Inject into the muscle. 1 each 0   No current facility-administered medications for this visit.    SURGICAL HISTORY:  Past Surgical History:  Procedure Laterality Date   chemo treatment      4 treatments that last 7 hours each/last treatmebt in 2013   COLONOSCOPY  2015   COLONOSCOPY W/ POLYPECTOMY  2005   due 2015   CYSTOSCOPY  2009   LOBECTOMY  11/02/2011    Procedure: LOBECTOMY;  Surgeon: Loreli Slot, MD;  Location: [facility] OR;  Service: Thoracic;  Laterality: Left;  LEFT LOWER LOBECTOMY   REFRACTIVE SURGERY     Bil   SKIN BIOPSY     4 0r 5   Squamous Cell Cancer Excision  12/13/2016   SUPRACLAVICAL NODE BIOPSY Left 10/15/2014   Procedure: LEFT SUPRACLAVICAL LYMPH NODE BIOPSY;  Surgeon: Loreli Slot, MD;  Location: [facility] OR;  Service: Thoracic;  Laterality: Left;  LEFT SUPRACLAVICAL LYMPH NODE BIOPSY   VASECTOMY     VEIN SURGERY     R ankle    REVIEW OF SYSTEMS:  A comprehensive review of systems was negative.   PHYSICAL EXAMINATION: General appearance: alert, cooperative and no distress Head: Normocephalic, without obvious abnormality, atraumatic Neck: no adenopathy, no JVD, supple, symmetrical, trachea midline and thyroid not enlarged, symmetric, no tenderness/mass/nodules Lymph nodes: Cervical, supraclavicular, and axillary nodes normal. Resp: clear to auscultation bilaterally Back: symmetric, no curvature. ROM normal. No CVA tenderness. Cardio: regular rate and rhythm, S1, S2 normal, no murmur, click, rub or gallop GI: soft, non-tender; bowel sounds normal; no masses,  no organomegaly Extremities: extremities normal, atraumatic, no cyanosis or edema  ECOG PERFORMANCE STATUS: 1 - Symptomatic but completely ambulatory  Blood pressure 120/66, pulse 82, temperature 97.8 F (36.6 C), temperature source Oral, resp. rate 17, height 5\' 10"  (1.778 m), weight 228 lb 3.2 oz (103.5 kg), SpO2 100%.  LABORATORY DATA: Lab Results  Component Value Date   WBC 14.9 (H) 02/20/2023   HGB 16.1 02/20/2023   HCT 44.9 02/20/2023   MCV 94.3 02/20/2023   PLT 221 02/20/2023      Chemistry      Component Value Date/Time   NA 135 02/20/2023 1149   NA 137 02/15/2017 1053   K 3.7 02/20/2023 1149   K 4.4 02/15/2017 1053   CL 96 (L) 02/20/2023 1149   CL 104 09/03/2012 1003   CO2 30 02/20/2023 1149   CO2 25 02/15/2017 1053   BUN 19  02/20/2023 1149   BUN 27.1 (H) 02/15/2017 1053   CREATININE 1.39 (H) 02/20/2023 1149   CREATININE 1.4 (H) 02/15/2017 1053      Component Value Date/Time   CALCIUM 9.9 02/20/2023 1149   CALCIUM 10.0 02/15/2017 1053   ALKPHOS 60 02/20/2023 1149   ALKPHOS 51 02/15/2017 1053   AST 25 02/20/2023 1149   AST 30 02/15/2017 1053   ALT 27 02/20/2023 1149   ALT 37 02/15/2017 1053   BILITOT 1.3 (H) 02/20/2023 1149   BILITOT 0.93 02/15/2017 1053       RADIOGRAPHIC STUDIES: CT Chest Wo Contrast  Result Date: 02/21/2023 CLINICAL DATA:  Non-small cell lung cancer (NSCLC), staging; Hematologic malignancy, staging * Tracking Code: BO *  EXAM: CT CHEST, ABDOMEN AND PELVIS WITHOUT CONTRAST TECHNIQUE: Multidetector CT imaging of the chest, abdomen and pelvis was performed following the standard protocol without IV contrast. RADIATION DOSE REDUCTION: This exam was performed according to the departmental dose-optimization program which includes automated exposure control, adjustment of the mA and/or kV according to patient size and/or use of iterative reconstruction technique. COMPARISON:  CT scan chest, abdomen and pelvis from 02/16/2022. FINDINGS: CT CHEST FINDINGS Cardiovascular: Normal cardiac size. No pericardial effusion. No aortic aneurysm. There are coronary artery calcifications, in keeping with coronary artery disease. There are also moderate peripheral atherosclerotic vascular calcifications of thoracic aorta and its major branches. Mediastinum/Nodes: Visualized thyroid gland appears grossly unremarkable. No solid / cystic mediastinal masses. The esophagus is nondistended precluding optimal assessment. Redemonstration of multiple prominent bilateral axillary, mediastinal and subpectoral lymph nodes without significant interval change. Evaluation of bilateral hila is limited due to lack of intravenous contrast; however, no bulky hilar lymphadenopathy identified. Lungs/Pleura: The central tracheo-bronchial  tree is patent, noting previous left lower lobectomy. There is a stable ground-glass nodule in the right lung upper lobe measuring 8 x 11 mm. No mass or consolidation. No pleural effusion or pneumothorax. There are multiple, sub 4 mm, calcified and noncalcified nodules throughout bilateral lungs (marked with electronic arrow sign on series 100). No new or suspicious lung nodules. Musculoskeletal: The visualized soft tissues of the chest wall are grossly unremarkable. No suspicious osseous lesions. There are mild multilevel degenerative changes in the visualized spine. CT ABDOMEN PELVIS FINDINGS Hepatobiliary: The liver is normal in size. Non-cirrhotic configuration. No suspicious mass. These is mild diffuse hepatic steatosis. Redemonstration of multiple hypoattenuating liver lesions with largest in the left hepatic lobe, segment 2 measuring 2.1 x 2.8 cm. No significant interval change. These are favored to represent cysts. No intrahepatic or extrahepatic bile duct dilation. No calcified gallstones. Normal gallbladder wall thickness. No pericholecystic inflammatory changes. Pancreas: Unremarkable. No pancreatic ductal dilatation or surrounding inflammatory changes. Spleen: Within normal limits. No focal lesion. Adrenals/Urinary Tract: Adrenal glands are unremarkable. No suspicious renal mass. No hydronephrosis. No renal or ureteric calculi. Urinary bladder is under distended, precluding optimal assessment. However, no large mass or stones identified. No perivesical fat stranding. Stomach/Bowel: There are small diverticula arising from the fundus of the stomach and third part of duodenum. No disproportionate dilation of the small or large bowel loops. No evidence of abnormal bowel wall thickening or inflammatory changes. The appendix is unremarkable. There are multiple diverticula throughout the colon, without imaging signs of diverticulitis. Vascular/Lymphatic: No ascites or pneumoperitoneum. There are multiple  enlarged retroperitoneal, mesenteric, common/external iliac and left inguinal group of lymph nodes with largest in the left inguinal region measuring up to 2.2 x 4.1 cm (previously 1.5 x 2.7 cm), and largest mesenteric lymph node measuring up to 2.2 x 2.9 cm, (previously 2.0 x 2.5 cm). There is interval increase in the size when compared to the prior exam from 02/16/2022. No aneurysmal dilation of the major abdominal arteries. There are moderate peripheral atherosclerotic vascular calcifications of the aorta and its major branches. Reproductive: Enlarged prostate. Symmetric seminal vesicles. Other: There are fat containing umbilical and bilateral inguinal hernias. Musculoskeletal: No suspicious osseous lesions. There are moderate multilevel degenerative changes in the visualized spine. IMPRESSION: 1. Interval increase in the size of pre-existing lymph nodes in the abdomen and pelvis, as described above. 2. Essentially stable thoracic lymphadenopathy and pre-existing right upper lobe ground-glass lung nodule. 3. Multiple other nonacute observations, as described above. Electronically Signed  By: Jules Schick M.D.   On: 02/21/2023 10:01   CT Abdomen Pelvis Wo Contrast  Result Date: 02/21/2023 CLINICAL DATA:  Non-small cell lung cancer (NSCLC), staging; Hematologic malignancy, staging * Tracking Code: BO * EXAM: CT CHEST, ABDOMEN AND PELVIS WITHOUT CONTRAST TECHNIQUE: Multidetector CT imaging of the chest, abdomen and pelvis was performed following the standard protocol without IV contrast. RADIATION DOSE REDUCTION: This exam was performed according to the departmental dose-optimization program which includes automated exposure control, adjustment of the mA and/or kV according to patient size and/or use of iterative reconstruction technique. COMPARISON:  CT scan chest, abdomen and pelvis from 02/16/2022. FINDINGS: CT CHEST FINDINGS Cardiovascular: Normal cardiac size. No pericardial effusion. No aortic aneurysm.  There are coronary artery calcifications, in keeping with coronary artery disease. There are also moderate peripheral atherosclerotic vascular calcifications of thoracic aorta and its major branches. Mediastinum/Nodes: Visualized thyroid gland appears grossly unremarkable. No solid / cystic mediastinal masses. The esophagus is nondistended precluding optimal assessment. Redemonstration of multiple prominent bilateral axillary, mediastinal and subpectoral lymph nodes without significant interval change. Evaluation of bilateral hila is limited due to lack of intravenous contrast; however, no bulky hilar lymphadenopathy identified. Lungs/Pleura: The central tracheo-bronchial tree is patent, noting previous left lower lobectomy. There is a stable ground-glass nodule in the right lung upper lobe measuring 8 x 11 mm. No mass or consolidation. No pleural effusion or pneumothorax. There are multiple, sub 4 mm, calcified and noncalcified nodules throughout bilateral lungs (marked with electronic arrow sign on series 100). No new or suspicious lung nodules. Musculoskeletal: The visualized soft tissues of the chest wall are grossly unremarkable. No suspicious osseous lesions. There are mild multilevel degenerative changes in the visualized spine. CT ABDOMEN PELVIS FINDINGS Hepatobiliary: The liver is normal in size. Non-cirrhotic configuration. No suspicious mass. These is mild diffuse hepatic steatosis. Redemonstration of multiple hypoattenuating liver lesions with largest in the left hepatic lobe, segment 2 measuring 2.1 x 2.8 cm. No significant interval change. These are favored to represent cysts. No intrahepatic or extrahepatic bile duct dilation. No calcified gallstones. Normal gallbladder wall thickness. No pericholecystic inflammatory changes. Pancreas: Unremarkable. No pancreatic ductal dilatation or surrounding inflammatory changes. Spleen: Within normal limits. No focal lesion. Adrenals/Urinary Tract: Adrenal glands  are unremarkable. No suspicious renal mass. No hydronephrosis. No renal or ureteric calculi. Urinary bladder is under distended, precluding optimal assessment. However, no large mass or stones identified. No perivesical fat stranding. Stomach/Bowel: There are small diverticula arising from the fundus of the stomach and third part of duodenum. No disproportionate dilation of the small or large bowel loops. No evidence of abnormal bowel wall thickening or inflammatory changes. The appendix is unremarkable. There are multiple diverticula throughout the colon, without imaging signs of diverticulitis. Vascular/Lymphatic: No ascites or pneumoperitoneum. There are multiple enlarged retroperitoneal, mesenteric, common/external iliac and left inguinal group of lymph nodes with largest in the left inguinal region measuring up to 2.2 x 4.1 cm (previously 1.5 x 2.7 cm), and largest mesenteric lymph node measuring up to 2.2 x 2.9 cm, (previously 2.0 x 2.5 cm). There is interval increase in the size when compared to the prior exam from 02/16/2022. No aneurysmal dilation of the major abdominal arteries. There are moderate peripheral atherosclerotic vascular calcifications of the aorta and its major branches. Reproductive: Enlarged prostate. Symmetric seminal vesicles. Other: There are fat containing umbilical and bilateral inguinal hernias. Musculoskeletal: No suspicious osseous lesions. There are moderate multilevel degenerative changes in the visualized spine. IMPRESSION: 1. Interval increase in  the size of pre-existing lymph nodes in the abdomen and pelvis, as described above. 2. Essentially stable thoracic lymphadenopathy and pre-existing right upper lobe ground-glass lung nodule. 3. Multiple other nonacute observations, as described above. Electronically Signed   By: Jules Schick M.D.   On: 02/21/2023 10:01     ASSESSMENT AND PLAN: This is a very pleasant 77 years old white male with: 1) stage IIA non-small cell lung  cancer, adenocarcinoma status post wedge resection of the left lower lobe with lymph node dissection in April 2013 followed by adjuvant systemic chemotherapy with cisplatin and Alimta.   2) small lymphocytic lymphoma: He will continue on observation for now. The patient is currently on observation and he is feeling fine with no concerning complaints. He had repeat CT scan of the chest, abdomen and pelvis performed recently.  I personally and independently reviewed the scan images and discussed the result with the patient today. His scan showed a stable thoracic lymphadenopathy and right upper lobe groundglass nodule but the scan of the abdomen showed interval increase in the size of pre-existing lymph nodes in the abdomen and pelvis. I discussed with the patient several options for management of his condition including repeating a PET scan now versus close monitoring with repeat CT scan of the chest, abdomen and pelvis in 6 months for evaluation of his disease.  He will continue on observation for the small lymphocytic lymphoma for now unless the patient becomes symptomatic. He agreed to the 6 months follow-up visit with repeat CT scan. He was advised to call immediately if he has any other concerning symptoms in the interval.  All questions were answered. The patient knows to call the clinic with any problems, questions or concerns. We can certainly see the patient much sooner if necessary.  Disclaimer: This note was dictated with voice recognition software. Similar sounding words can inadvertently be transcribed and may not be corrected upon review.

## 2023-02-27 ENCOUNTER — Encounter (INDEPENDENT_AMBULATORY_CARE_PROVIDER_SITE_OTHER): Payer: Self-pay

## 2023-03-17 ENCOUNTER — Other Ambulatory Visit: Payer: Self-pay | Admitting: Family Medicine

## 2023-03-17 DIAGNOSIS — I10 Essential (primary) hypertension: Secondary | ICD-10-CM

## 2023-03-20 ENCOUNTER — Ambulatory Visit (INDEPENDENT_AMBULATORY_CARE_PROVIDER_SITE_OTHER): Payer: Medicare HMO | Admitting: *Deleted

## 2023-03-20 VITALS — BP 138/75 | HR 81

## 2023-03-20 DIAGNOSIS — Z0000 Encounter for general adult medical examination without abnormal findings: Secondary | ICD-10-CM | POA: Diagnosis not present

## 2023-03-20 NOTE — Progress Notes (Signed)
Subjective:   Kyle Long is a 77 y.o. male who presents for Medicare Annual/Subsequent preventive examination.  Visit Complete: Virtual  I connected with  Kyle Long on 03/20/23 by a audio enabled telemedicine application and verified that I am speaking with the correct person using two identifiers.  Patient Location: Home  Provider Location: Office/Clinic  I discussed the limitations of evaluation and management by telemedicine. The patient expressed understanding and agreed to proceed.   Review of Systems     Cardiac Risk Factors include: male gender;advanced age (>20men, >18 women);dyslipidemia;hypertension     Objective:   Pt reported vitals. Today's Vitals   03/20/23 0910  BP: 138/75  Pulse: 81   There is no height or weight on file to calculate BMI.     03/20/2023    9:02 AM 03/14/2022    9:02 AM 03/08/2021    9:01 AM 08/26/2020    4:36 PM 03/07/2020    1:56 PM 02/24/2020   10:49 AM 02/26/2018   10:25 AM  Advanced Directives  Does Patient Have a Medical Advance Directive? Yes Yes Yes Yes Yes Yes Yes  Type of Estate agent of [place];Living will Healthcare Power of [place];Living will Healthcare Power of [place];Living will Living will Healthcare Power of [place];Living will Living will;Healthcare Power of [employer] Power of [place];Living will  Does patient want to make changes to medical advance directive? No - Patient declined No - Patient declined  No - Patient declined No - Patient declined    Copy of Healthcare Power of Attorney in Chart? Yes - validated most recent copy scanned in chart (See row information) Yes - validated most recent copy scanned in chart (See row information) Yes - validated most recent copy scanned in chart (See row information)  Yes - validated most recent copy scanned in chart (See row information)  No - copy requested    Current Medications (verified) Outpatient Encounter Medications as of  03/20/2023  Medication Sig   chlorthalidone (HYGROTON) 25 MG tablet TAKE 1 TABLET DAILY   diltiazem (CARDIZEM CD) 240 MG 24 hr capsule TAKE 1 CAPSULE DAILY   losartan (COZAAR) 100 MG tablet TAKE 1/2 TABLET DAILY   metoprolol tartrate (LOPRESSOR) 25 MG tablet TAKE 1 TABLET TWICE A DAY   mometasone (ELOCON) 0.1 % cream Apply 1 application topically 2 (two) times daily as needed. Dr Eilleen Kempf dermatology [place]   potassium chloride (KLOR-CON M) 10 MEQ tablet Take 1 tablet (10 mEq total) by mouth daily.   [DISCONTINUED] amoxicillin-clavulanate (AUGMENTIN) 875-125 MG tablet Take 1 tablet by mouth every 12 (twelve) hours.   [DISCONTINUED] chlorpheniramine-HYDROcodone (TUSSIONEX) 10-8 MG/5ML Take 5 mLs by mouth every 12 (twelve) hours as needed for cough.   [DISCONTINUED] predniSONE (DELTASONE) 20 MG tablet Take 2 tablets (40 mg total) by mouth daily with breakfast.   [DISCONTINUED] RSV vaccine recomb adjuvanted (AREXVY) 120 MCG/0.5ML injection Inject into the muscle.   No facility-administered encounter medications on file as of 03/20/2023.    Allergies (verified) Lisinopril   History: Past Medical History:  Diagnosis Date   Allergic state 06/26/2015   Amnesia 2009   isolated;for 30 mins w/elevated BP   Arthritis    Cancer (HCC)    skin / top head   Cataract    bilateral repair   Cellulitis 2011   LUE (no PMH of MRSA)/left elbow   Chronic kidney disease    resolved   Diverticulosis    Diverticulosis of colon    GERD (  gastroesophageal reflux disease)    OCC HEARTBURN   Gilbert's syndrome    Hematuria 2009   Dr Merry Lofty   Hyperglycemia    Hyperlipidemia    Hypertension    Lung cancer, lower lobe (HCC)    Stage IIA (T1,N1)   Lymphoma (HCC)    Low grad   Neuromuscular disorder (HCC)    2010 TEMPORARY MEMORY LOSS 1-1.5 HR WAS CHECKED OUT AT [facility] , NOTHING FOUND   Peptic ulcer 1995   Peyronie disease    Preventative health care 12/19/2015   Primary  cancer of left lower lobe of lung (HCC) 11/27/2011   Recurrent upper respiratory infection (URI)    09/2011  BRONCHITIS   SCC (squamous cell carcinoma) 01/21/2009   Qualifier: Diagnosis of  By: Alwyn Ren MD, William   ? Basal Cell     Past Surgical History:  Procedure Laterality Date   chemo treatment      4 treatments that last 7 hours each/last treatmebt in 2013   COLONOSCOPY  2015   COLONOSCOPY W/ POLYPECTOMY  2005   due 2015   CYSTOSCOPY  2009   LOBECTOMY  11/02/2011   Procedure: LOBECTOMY;  Surgeon: Loreli Slot, MD;  Location[facility] OR;  Service: Thoracic;  Laterality: Left;  LEFT LOWER LOBECTOMY   REFRACTIVE SURGERY     Bil   SKIN BIOPSY     4 0r 5   Squamous Cell Cancer Excision  12/13/2016   SUPRACLAVICAL NODE BIOPSY Left 10/15/2014   Procedure: LEFT SUPRACLAVICAL LYMPH NODE BIOPSY;  Surgeon: Loreli Slot, MD;  Location: [facility] OR;  Service: Thoracic;  Laterality: Left;  LEFT SUPRACLAVICAL LYMPH NODE BIOPSY   VASECTOMY     VEIN SURGERY     R ankle   Family History  Problem Relation Age of Onset   Drug abuse Mother    Stroke Mother        in 4s   Hypertension Mother    Breast cancer Mother    Neuropathy Mother    Melanoma Father    Heart attack Father 21       stents   Dementia Father    Cancer Sister        multiple myeloma   Hypertension Sister    Thyroid disease Sister        ??   Multiple myeloma Sister    Headache Daughter    Diabetes Paternal Aunt    Stomach cancer Maternal Grandfather 23   Lung cancer Maternal Grandfather    Cancer Paternal Grandmother        lung, nonsmoker   Colon cancer Neg Hx    Colon polyps Neg Hx    Esophageal cancer Neg Hx    Rectal cancer Neg Hx    Social History   Socioeconomic History   Marital status: Married    Spouse name: Not on file   Number of children: Not on file   Years of education: Not on file   Highest education level: Not on file  Occupational History   Occupation: retired  Tobacco Use   Smoking  status: Former    Current packs/day: 0.00    Average packs/day: 1 pack/day for 15.0 years (15.0 ttl pk-yrs)    Types: Cigarettes    Start date: 07/30/1964    Quit date: 07/31/1979    Years since quitting: 43.6   Smokeless tobacco: Former    Types: Engineer, drilling   Vaping status: Never Used  Substance and  Sexual Activity   Alcohol use: Yes    Comment: Daily 3 glasses of vodka   Drug use: No   Sexual activity: Not Currently    Comment: lives with wife, retired from [employer], ran distribution. No dietary restrictions.  Other Topics Concern   Not on file  Social History Narrative   Reg exercise   Social Determinants of Health   Financial Resource Strain: Low Risk  (03/20/2023)   Overall Financial Resource Strain (CARDIA)    Difficulty of Paying Living Expenses: Not hard at all  Food Insecurity: No Food Insecurity (03/20/2023)   Hunger Vital Sign    Worried About Running Out of Food in the Last Year: Never true    Ran Out of Food in the Last Year: Never true  Transportation Needs: No Transportation Needs (03/20/2023)   PRAPARE - Administrator, Civil Service (Medical): No    Lack of Transportation (Non-Medical): No  Physical Activity: Inactive (03/20/2023)   Exercise Vital Sign    Days of Exercise per Week: 0 days    Minutes of Exercise per Session: 0 min  Stress: No Stress Concern Present (03/20/2023)   [employer] of Occupational Health - Occupational Stress Questionnaire    Feeling of Stress : Not at all  Social Connections: Moderately Integrated (03/20/2023)   Social Connection and Isolation Panel [NHANES]    Frequency of Communication with Friends and Family: Twice a week    Frequency of Social Gatherings with Friends and Family: Once a week    Attends Religious Services: More than 4 times per year    Active Member of [employer] or Organizations: No    Attends Engineer, structural: Never    Marital Status: Married    Tobacco  Counseling Counseling given: Not Answered   Clinical Intake:  Pre-visit preparation completed: Yes  Pain : No/denies pain  Nutritional Risks: None Diabetes: No  How often do you need to have someone help you when you read instructions, pamphlets, or other written materials from your doctor or pharmacy?: 1 - Never  Interpreter Needed?: No  Information entered by :: [employer], CMA   Activities of Daily Living    03/20/2023    9:02 AM  In your present state of health, do you have any difficulty performing the following activities:  Hearing? 0  Vision? 0  Difficulty concentrating or making decisions? 0  Walking or climbing stairs? 0  Dressing or bathing? 0  Doing errands, shopping? 0  Preparing Food and eating ? N  Using the Toilet? N  In the past six months, have you accidently leaked urine? N  Do you have problems with loss of bowel control? N  Managing your Medications? N  Managing your Finances? N  Housekeeping or managing your Housekeeping? N    Patient Care Team: [name], MD as PCP - General (Family Medicine) Primitivo Gauze Melida Gimenez, MD as Referring Physician (Nephrology) Si Gaul, MD as Consulting Physician (Oncology) Clarisse Gouge, MD as Referring Physician (Dermatopathology) Blima Ledger, OD as Consulting Physician (Optometry)  Indicate any recent Medical Services you may have received from other than [facility] providers in the past year (date may be approximate).     Assessment:   This is a routine wellness examination for [place].  Hearing/Vision screen No results found.  Dietary issues and exercise activities discussed:     Goals Addressed   None    Depression Screen    03/20/2023    9:09 AM  11/27/2022   10:46 AM 05/28/2022   10:27 AM 03/14/2022    9:03 AM 03/08/2021    9:03 AM 02/02/2021    9:20 AM 03/07/2020    1:58 PM  PHQ 2/9 Scores  PHQ - 2 Score 0 0 0 0 0 0 0  PHQ- 9 Score  0 0        Fall Risk    03/20/2023    9:04 AM  11/27/2022   10:46 AM 05/28/2022   10:27 AM 03/14/2022    9:02 AM 03/08/2021    9:02 AM  Fall Risk   Falls in the past year? 0 0 0 0 0  Number falls in past yr: 0 0 0 0 0  Injury with Fall? 0 0 0 0 0  Risk for fall due to : No Fall Risks   No Fall Risks   Follow up Falls evaluation completed Falls evaluation completed Falls evaluation completed Falls evaluation completed Falls prevention discussed    MEDICARE RISK AT HOME: Medicare Risk at Home Any stairs in or around the home?: Yes If so, are there any without handrails?: No Home free of loose throw rugs in walkways, pet beds, electrical cords, etc?: Yes Adequate lighting in your home to reduce risk of falls?: Yes Life alert?: No Use of a cane, walker or w/c?: No Grab bars in the bathroom?: No Shower chair or bench in shower?: No Elevated toilet seat or a handicapped toilet?: No  TIMED UP AND GO:  Was the test performed?  No    Cognitive Function:    11/18/2017   11:19 AM 11/16/2016   10:17 AM 11/15/2015   10:36 AM  MMSE - Mini Mental State Exam  Orientation to time 5 5 5   Orientation to Place 5 5 5   Registration 3 3 3   Attention/ Calculation 5 5 5   Recall 3 3 3   Language- name 2 objects 2 2 2   Language- repeat 1 1 1   Language- follow 3 step command 3 3 3   Language- read & follow direction 1 1 1   Write a sentence 1 1 1   Copy design 1 1 1   Total score 30 30 30         03/20/2023    9:10 AM 03/14/2022    9:08 AM  6CIT Screen  What Year? 0 points 0 points  What month? 0 points 0 points  What time? 0 points 0 points  Count back from 20 0 points 0 points  Months in reverse 0 points 0 points  Repeat phrase 0 points 0 points  Total Score 0 points 0 points    Immunizations Immunization History  Administered Date(s) Administered   COVID-19, mRNA, vaccine(Comirnaty)12 years and older 07/09/2022   Fluad Quad(high Dose 65+) 04/21/2019, 05/13/2020, 05/05/2021, 05/28/2022   Influenza, High Dose Seasonal PF 06/06/2016,  06/07/2017, 05/19/2018   Influenza,inj,Quad PF,6+ Mos 06/17/2015   PFIZER Comirnaty(Gray Top)Covid-19 Tri-Sucrose Vaccine 02/02/2021   PFIZER(Purple Top)SARS-COV-2 Vaccination 08/11/2019, 08/31/2019, 05/13/2020   Pfizer Covid-19 Vaccine Bivalent Booster 28yrs & up 07/19/2021   Pneumococcal Conjugate-13 12/13/2014   Pneumococcal Polysaccharide-23 02/01/2010   Respiratory Syncytial Virus Vaccine,Recomb Aduvanted(Arexvy) 06/07/2022   Td 09/27/2001   Tdap 09/04/2012, 12/13/2022   Zoster Recombinant(Shingrix) 10/23/2021   Zoster, Unspecified 05/10/2022    TDAP status: Up to date  Flu Vaccine status: Due, Education has been provided regarding the importance of this vaccine. Advised may receive this vaccine at local pharmacy or Health Dept. Aware to provide a copy of the vaccination  record if obtained from local pharmacy or Health Dept. Verbalized acceptance and understanding.  Pneumococcal vaccine status: Up to date  Covid-19 vaccine status: Information provided on how to obtain vaccines.   Qualifies for Shingles Vaccine? Yes   Zostavax completed Yes   Shingrix Completed?: No.    Education has been provided regarding the importance of this vaccine. Patient has been advised to call insurance company to determine out of pocket expense if they have not yet received this vaccine. Advised may also receive vaccine at local pharmacy or Health Dept. Verbalized acceptance and understanding.  Screening Tests Health Maintenance  Topic Date Due   Medicare Annual Wellness (AWV)  03/15/2023   INFLUENZA VACCINE  02/28/2023   COVID-19 Vaccine (7 - 2023-24 season) 03/22/2023 (Originally 09/03/2022)   Colonoscopy  01/05/2025   DTaP/Tdap/Td (4 - Td or Tdap) 12/12/2032   Pneumonia Vaccine 26+ Years old  Completed   Hepatitis C Screening  Completed   Zoster Vaccines- Shingrix  Completed   HPV VACCINES  Aged Out    Health Maintenance  Health Maintenance Due  Topic Date Due   Medicare Annual Wellness  (AWV)  03/15/2023   INFLUENZA VACCINE  02/28/2023    Colorectal cancer screening: No longer required.   Lung Cancer Screening: (Low Dose CT Chest recommended if Age 64-80 years, 20 pack-year currently smoking OR have quit w/in 15years.) does not qualify.   Additional Screening:  Hepatitis C Screening: does qualify; Completed 12/18/16  Vision Screening: Recommended annual ophthalmology exams for early detection of glaucoma and other disorders of the eye. Is the patient up to date with their annual eye exam?  Yes  Who is the provider or what is the name of the office in which the patient attends annual eye exams? Dr. Blima Ledger If pt is not established with a provider, would they like to be referred to a provider to establish care? No .   Dental Screening: Recommended annual dental exams for proper oral hygiene  Diabetic Foot Exam: N/a  Community Resource Referral / Chronic Care Management: CRR required this visit?  No   CCM required this visit?  No     Plan:     I have personally reviewed and noted the following in the patient's chart:   Medical and social history Use of alcohol, tobacco or illicit drugs  Current medications and supplements including opioid prescriptions. Patient is not currently taking opioid prescriptions. Functional ability and status Nutritional status Physical activity Advanced directives List of other physicians Hospitalizations, surgeries, and ER visits in previous 12 months Vitals Screenings to include cognitive, depression, and falls Referrals and appointments  In addition, I have reviewed and discussed with patient certain preventive protocols, quality metrics, and best practice recommendations. A written personalized care plan for preventive services as well as general preventive health recommendations were provided to patient.     Donne Anon, CMA   03/20/2023   After Visit Summary: (MyChart) Due to this being a telephonic visit, the  after visit summary with patients personalized plan was offered to patient via MyChart   Nurse Notes: None

## 2023-03-20 NOTE — Patient Instructions (Signed)
[name] , Thank you for taking time to come for your Medicare Wellness Visit. I appreciate your ongoing commitment to your health goals. Please review the following plan we discussed and let me know if I can assist you in the future.     This is a list of the screening recommended for you and due dates:  Health Maintenance  Topic Date Due   Flu Shot  02/28/2023   COVID-19 Vaccine (7 - 2023-24 season) 03/22/2023*   Medicare Annual Wellness Visit  03/19/2024   Colon Cancer Screening  01/05/2025   DTaP/Tdap/Td vaccine (4 - Td or Tdap) 12/12/2032   Pneumonia Vaccine  Completed   Hepatitis C Screening  Completed   Zoster (Shingles) Vaccine  Completed   HPV Vaccine  Aged Out  *Topic was postponed. The date shown is not the original due date.    Next appointment: Follow up in one year for your annual wellness visit.   Preventive Care 55 Years and Older, Male Preventive care refers to lifestyle choices and visits with your health care provider that can promote health and wellness. What does preventive care include? A yearly physical exam. This is also called an annual well check. Dental exams once or twice a year. Routine eye exams. Ask your health care provider how often you should have your eyes checked. Personal lifestyle choices, including: Daily care of your teeth and gums. Regular physical activity. Eating a healthy diet. Avoiding tobacco and drug use. Limiting alcohol use. Practicing safe sex. Taking low doses of aspirin every day. Taking vitamin and mineral supplements as recommended by your health care provider. What happens during an annual well check? The services and screenings done by your health care provider during your annual well check will depend on your age, overall health, lifestyle risk factors, and family history of disease. Counseling  Your health care provider may ask you questions about your: Alcohol use. Tobacco use. Drug use. Emotional  well-being. Home and relationship well-being. Sexual activity. Eating habits. History of falls. Memory and ability to understand (cognition). Work and work Astronomer. Screening  You may have the following tests or measurements: Height, weight, and BMI. Blood pressure. Lipid and cholesterol levels. These may be checked every 5 years, or more frequently if you are over 65 years old. Skin check. Lung cancer screening. You may have this screening every year starting at age 50 if you have a 30-pack-year history of smoking and currently smoke or have quit within the past 15 years. Fecal occult blood test (FOBT) of the stool. You may have this test every year starting at age 37. Flexible sigmoidoscopy or colonoscopy. You may have a sigmoidoscopy every 5 years or a colonoscopy every 10 years starting at age 61. Prostate cancer screening. Recommendations will vary depending on your family history and other risks. Hepatitis C blood test. Hepatitis B blood test. Sexually transmitted disease (STD) testing. Diabetes screening. This is done by checking your blood sugar (glucose) after you have not eaten for a while (fasting). You may have this done every 1-3 years. Abdominal aortic aneurysm (AAA) screening. You may need this if you are a current or former smoker. Osteoporosis. You may be screened starting at age 64 if you are at high risk. Talk with your health care provider about your test results, treatment options, and if necessary, the need for more tests. Vaccines  Your health care provider may recommend certain vaccines, such as: Influenza vaccine. This is recommended every year. Tetanus, diphtheria, and acellular  pertussis (Tdap, Td) vaccine. You may need a Td booster every 10 years. Zoster vaccine. You may need this after age 34. Pneumococcal 13-valent conjugate (PCV13) vaccine. One dose is recommended after age 47. Pneumococcal polysaccharide (PPSV23) vaccine. One dose is recommended after  age 63. Talk to your health care provider about which screenings and vaccines you need and how often you need them. This information is not intended to replace advice given to you by your health care provider. Make sure you discuss any questions you have with your health care provider. Document Released: 08/12/2015 Document Revised: 04/04/2016 Document Reviewed: 05/17/2015 Elsevier Interactive Patient Education  2017 [employer].  Fall Prevention in the Home Falls can cause injuries. They can happen to people of all ages. There are many things you can do to make your home safe and to help prevent falls. What can I do on the outside of my home? Regularly fix the edges of walkways and driveways and fix any cracks. Remove anything that might make you trip as you walk through a door, such as a raised step or threshold. Trim any bushes or trees on the path to your home. Use bright outdoor lighting. Clear any walking paths of anything that might make someone trip, such as rocks or tools. Regularly check to see if handrails are loose or broken. Make sure that both sides of any steps have handrails. Any raised decks and porches should have guardrails on the edges. Have any leaves, snow, or ice cleared regularly. Use sand or salt on walking paths during winter. Clean up any spills in your garage right away. This includes oil or grease spills. What can I do in the bathroom? Use night lights. Install grab bars by the toilet and in the tub and shower. Do not use towel bars as grab bars. Use non-skid mats or decals in the tub or shower. If you need to sit down in the shower, use a plastic, non-slip stool. Keep the floor dry. Clean up any water that spills on the floor as soon as it happens. Remove soap buildup in the tub or shower regularly. Attach bath mats securely with double-sided non-slip rug tape. Do not have throw rugs and other things on the floor that can make you trip. What can I do in the  bedroom? Use night lights. Make sure that you have a light by your bed that is easy to reach. Do not use any sheets or blankets that are too big for your bed. They should not hang down onto the floor. Have a firm chair that has side arms. You can use this for support while you get dressed. Do not have throw rugs and other things on the floor that can make you trip. What can I do in the kitchen? Clean up any spills right away. Avoid walking on wet floors. Keep items that you use a lot in easy-to-reach places. If you need to reach something above you, use a strong step stool that has a grab bar. Keep electrical cords out of the way. Do not use floor polish or wax that makes floors slippery. If you must use wax, use non-skid floor wax. Do not have throw rugs and other things on the floor that can make you trip. What can I do with my stairs? Do not leave any items on the stairs. Make sure that there are handrails on both sides of the stairs and use them. Fix handrails that are broken or loose. Make sure that handrails  are as long as the stairways. Check any carpeting to make sure that it is firmly attached to the stairs. Fix any carpet that is loose or worn. Avoid having throw rugs at the top or bottom of the stairs. If you do have throw rugs, attach them to the floor with carpet tape. Make sure that you have a light switch at the top of the stairs and the bottom of the stairs. If you do not have them, ask someone to add them for you. What else can I do to help prevent falls? Wear shoes that: Do not have high heels. Have rubber bottoms. Are comfortable and fit you well. Are closed at the toe. Do not wear sandals. If you use a stepladder: Make sure that it is fully opened. Do not climb a closed stepladder. Make sure that both sides of the stepladder are locked into place. Ask someone to hold it for you, if possible. Clearly mark and make sure that you can see: Any grab bars or  handrails. First and last steps. Where the edge of each step is. Use tools that help you move around (mobility aids) if they are needed. These include: Canes. Walkers. Scooters. Crutches. Turn on the lights when you go into a dark area. Replace any light bulbs as soon as they burn out. Set up your furniture so you have a clear path. Avoid moving your furniture around. If any of your floors are uneven, fix them. If there are any pets around you, be aware of where they are. Review your medicines with your doctor. Some medicines can make you feel dizzy. This can increase your chance of falling. Ask your doctor what other things that you can do to help prevent falls. This information is not intended to replace advice given to you by your health care provider. Make sure you discuss any questions you have with your health care provider. Document Released: 05/12/2009 Document Revised: 12/22/2015 Document Reviewed: 08/20/2014 Elsevier Interactive Patient Education  2017 [employer].

## 2023-04-06 ENCOUNTER — Other Ambulatory Visit: Payer: Self-pay | Admitting: Family Medicine

## 2023-04-24 ENCOUNTER — Other Ambulatory Visit: Payer: Self-pay | Admitting: Family Medicine

## 2023-05-08 ENCOUNTER — Other Ambulatory Visit (HOSPITAL_BASED_OUTPATIENT_CLINIC_OR_DEPARTMENT_OTHER): Payer: Self-pay

## 2023-05-08 MED ORDER — INFLUENZA VAC A&B SURF ANT ADJ 0.5 ML IM SUSY
0.5000 mL | PREFILLED_SYRINGE | Freq: Once | INTRAMUSCULAR | 0 refills | Status: AC
  Filled 2023-05-08: qty 0.5, 1d supply, fill #0

## 2023-05-11 ENCOUNTER — Other Ambulatory Visit: Payer: Self-pay | Admitting: Family Medicine

## 2023-06-04 ENCOUNTER — Encounter: Payer: Medicare HMO | Admitting: Family Medicine

## 2023-06-30 NOTE — Assessment & Plan Note (Signed)
Encourage heart healthy diet such as MIND or DASH diet, increase exercise, avoid trans fats, simple carbohydrates and processed foods, consider a krill or fish or flaxseed oil cap daily.  °

## 2023-06-30 NOTE — Assessment & Plan Note (Signed)
Hydrate and monitor 

## 2023-06-30 NOTE — Assessment & Plan Note (Signed)
Following with Dr Shirline Frees at oncology

## 2023-06-30 NOTE — Assessment & Plan Note (Signed)
Well controlled, no changes to meds. Encouraged heart healthy diet such as the DASH diet and exercise as tolerated.  °

## 2023-06-30 NOTE — Assessment & Plan Note (Signed)
Patient encouraged to maintain heart healthy diet, regular exercise, adequate sleep. Consider daily probiotics. Take medications as prescribed. Labs ordered and reviewed. Has aged out of screening colonoscopies.

## 2023-06-30 NOTE — Assessment & Plan Note (Signed)
hgba1c acceptable, minimize simple carbs. Increase exercise as tolerated.  

## 2023-07-02 ENCOUNTER — Ambulatory Visit (INDEPENDENT_AMBULATORY_CARE_PROVIDER_SITE_OTHER): Payer: Medicare HMO | Admitting: Family Medicine

## 2023-07-02 VITALS — BP 148/80 | HR 73 | Temp 98.3°F | Resp 18 | Ht 70.0 in | Wt 232.6 lb

## 2023-07-02 DIAGNOSIS — R739 Hyperglycemia, unspecified: Secondary | ICD-10-CM

## 2023-07-02 DIAGNOSIS — Z0000 Encounter for general adult medical examination without abnormal findings: Secondary | ICD-10-CM

## 2023-07-02 DIAGNOSIS — N183 Chronic kidney disease, stage 3 unspecified: Secondary | ICD-10-CM | POA: Diagnosis not present

## 2023-07-02 DIAGNOSIS — C3432 Malignant neoplasm of lower lobe, left bronchus or lung: Secondary | ICD-10-CM | POA: Diagnosis not present

## 2023-07-02 DIAGNOSIS — E782 Mixed hyperlipidemia: Secondary | ICD-10-CM | POA: Diagnosis not present

## 2023-07-02 DIAGNOSIS — I10 Essential (primary) hypertension: Secondary | ICD-10-CM | POA: Diagnosis not present

## 2023-07-02 LAB — COMPREHENSIVE METABOLIC PANEL
ALT: 30 U/L (ref 0–53)
AST: 24 U/L (ref 0–37)
Albumin: 4.4 g/dL (ref 3.5–5.2)
Alkaline Phosphatase: 48 U/L (ref 39–117)
BUN: 20 mg/dL (ref 6–23)
CO2: 34 meq/L — ABNORMAL HIGH (ref 19–32)
Calcium: 9.9 mg/dL (ref 8.4–10.5)
Chloride: 94 meq/L — ABNORMAL LOW (ref 96–112)
Creatinine, Ser: 1.3 mg/dL (ref 0.40–1.50)
GFR: 52.36 mL/min — ABNORMAL LOW (ref >60.00)
Glucose, Bld: 108 mg/dL — ABNORMAL HIGH (ref 70–99)
Potassium: 3.6 meq/L (ref 3.5–5.1)
Sodium: 135 meq/L (ref 135–145)
Total Bilirubin: 1.2 mg/dL (ref 0.2–1.2)
Total Protein: 7.2 g/dL (ref 6.0–8.3)

## 2023-07-02 LAB — CBC WITH DIFFERENTIAL/PLATELET
Basophils Absolute: 0 10*3/uL (ref 0.0–0.1)
Basophils Relative: 0.4 % (ref 0.0–3.0)
Eosinophils Absolute: 0.1 10*3/uL (ref 0.0–0.7)
Eosinophils Relative: 0.9 % (ref 0.0–5.0)
HCT: 44.2 % (ref 39.0–52.0)
Hemoglobin: 15.2 g/dL (ref 13.0–17.0)
Lymphocytes Relative: 35.4 % (ref 12.0–46.0)
Lymphs Abs: 4 10*3/uL (ref 0.7–4.0)
MCHC: 34.3 g/dL (ref 30.0–36.0)
MCV: 98.3 fL (ref 78.0–100.0)
Monocytes Absolute: 0.9 10*3/uL (ref 0.1–1.0)
Monocytes Relative: 8.1 % (ref 3.0–12.0)
Neutro Abs: 6.2 10*3/uL (ref 1.4–7.7)
Neutrophils Relative %: 55.2 % (ref 43.0–77.0)
Platelets: 233 10*3/uL (ref 150.0–400.0)
RBC: 4.5 Mil/uL (ref 4.22–5.81)
RDW: 12.7 % (ref 11.5–15.5)
WBC: 11.2 10*3/uL — ABNORMAL HIGH (ref 4.0–10.5)

## 2023-07-02 LAB — LIPID PANEL
Cholesterol: 181 mg/dL (ref 0–200)
HDL: 43.7 mg/dL (ref >39.00)
LDL Cholesterol: 85 mg/dL (ref 0–99)
NonHDL: 137.68
Total CHOL/HDL Ratio: 4
Triglycerides: 265 mg/dL — ABNORMAL HIGH (ref 0.0–149.0)
VLDL: 53 mg/dL — ABNORMAL HIGH (ref 0.0–40.0)

## 2023-07-02 LAB — TSH: TSH: 2.79 u[IU]/mL (ref 0.35–5.50)

## 2023-07-02 LAB — HEMOGLOBIN A1C: Hgb A1c MFr Bld: 6 % (ref 4.6–6.5)

## 2023-07-02 NOTE — Patient Instructions (Addendum)
Chair yoga  Covid boosters  Netflix: Live to 100 the Secrets of the Blue Zones  Fluids 10 ounces roughly every 1-2 hours  Preventive Care 65 Years and Older, Male Preventive care refers to lifestyle choices and visits with your health care provider that can promote health and wellness. Preventive care visits are also called wellness exams. What can I expect for my preventive care visit? Counseling During your preventive care visit, your health care provider may ask about your: Medical history, including: Past medical problems. Family medical history. History of falls. Current health, including: Emotional well-being. Home life and relationship well-being. Sexual activity. Memory and ability to understand (cognition). Lifestyle, including: Alcohol, nicotine or tobacco, and drug use. Access to firearms. Diet, exercise, and sleep habits. Work and work Astronomer. Sunscreen use. Safety issues such as seatbelt and bike helmet use. Physical exam Your health care provider will check your: Height and weight. These may be used to calculate your BMI (body mass index). BMI is a measurement that tells if you are at a healthy weight. Waist circumference. This measures the distance around your waistline. This measurement also tells if you are at a healthy weight and may help predict your risk of certain diseases, such as type 2 diabetes and high blood pressure. Heart rate and blood pressure. Body temperature. Skin for abnormal spots. What immunizations do I need?  Vaccines are usually given at various ages, according to a schedule. Your health care provider will recommend vaccines for you based on your age, medical history, and lifestyle or other factors, such as travel or where you work. What tests do I need? Screening Your health care provider may recommend screening tests for certain conditions. This may include: Lipid and cholesterol levels. Diabetes screening. This is done by  checking your blood sugar (glucose) after you have not eaten for a while (fasting). Hepatitis C test. Hepatitis B test. HIV (human immunodeficiency virus) test. STI (sexually transmitted infection) testing, if you are at risk. Lung cancer screening. Colorectal cancer screening. Prostate cancer screening. Abdominal aortic aneurysm (AAA) screening. You may need this if you are a current or former smoker. Talk with your health care provider about your test results, treatment options, and if necessary, the need for more tests. Follow these instructions at home: Eating and drinking  Eat a diet that includes fresh fruits and vegetables, whole grains, lean protein, and low-fat dairy products. Limit your intake of foods with high amounts of sugar, saturated fats, and salt. Take vitamin and mineral supplements as recommended by your health care provider. Do not drink alcohol if your health care provider tells you not to drink. If you drink alcohol: Limit how much you have to 0-2 drinks a day. Know how much alcohol is in your drink. In the [place] one drink equals one 12 oz bottle of beer (355 mL), one 5 oz glass of wine (148 mL), or one 1 oz glass of hard liquor (44 mL). Lifestyle Brush your teeth every morning and night with fluoride toothpaste. Floss one time each day. Exercise for at least 30 minutes 5 or more days each week. Do not use any products that contain nicotine or tobacco. These products include cigarettes, chewing tobacco, and vaping devices, such as e-cigarettes. If you need help quitting, ask your health care provider. Do not use drugs. If you are sexually active, practice safe sex. Use a condom or other form of protection to prevent STIs. Take aspirin only as told by your health care provider. Make sure  that you understand how much to take and what form to take. Work with your health care provider to find out whether it is safe and beneficial for you to take aspirin daily. Ask your  health care provider if you need to take a cholesterol-lowering medicine (statin). Find healthy ways to manage stress, such as: Meditation, yoga, or listening to music. Journaling. Talking to a trusted person. Spending time with friends and family. Safety Always wear your seat belt while driving or riding in a vehicle. Do not drive: If you have been drinking alcohol. Do not ride with someone who has been drinking. When you are tired or distracted. While texting. If you have been using any mind-altering substances or drugs. Wear a helmet and other protective equipment during sports activities. If you have firearms in your house, make sure you follow all gun safety procedures. Minimize exposure to UV radiation to reduce your risk of skin cancer. What's next? Visit your health care provider once a year for an annual wellness visit. Ask your health care provider how often you should have your eyes and teeth checked. Stay up to date on all vaccines. This information is not intended to replace advice given to you by your health care provider. Make sure you discuss any questions you have with your health care provider. Document Revised: 01/11/2021 Document Reviewed: 01/11/2021 Elsevier Patient Education  2024 [employer].

## 2023-07-03 ENCOUNTER — Encounter: Payer: Self-pay | Admitting: Family Medicine

## 2023-07-03 NOTE — Progress Notes (Addendum)
 Subjective:    Patient ID: Kyle Long, male    DOB: April 22, 1946, 77 y.o.   MRN: [id number]  Chief Complaint  Patient presents with   Annual Exam    HPI Discussed the use of AI scribe software for clinical note transcription with the patient, who gave verbal consent to proceed.  Patient is a 77 year old male in today for annual preventative exam and follow up on chronic medical concerns. No recent febrile illness or hospitalizations. Denies CP/palp/SOB/HA/congestion/fevers/GI or GU c/o. Taking meds as prescribed. He has a history of low-grade lymphoma, arthritis and CKD. He reports overall he is doing well without any pressing health concerns. His primary complaint is hip pain, likely due to arthritis, which wakes him up at night. The pain is over both hips and improves with position changes. He urinates frequently at night, sometimes up to five times.      Past Medical History:  Diagnosis Date   Allergic state 06/26/2015   Amnesia 2009   isolated;for 30 mins w/elevated BP   Arthritis    Cancer (HCC)    skin / top head   Cataract    bilateral repair   Cellulitis 2011   LUE (no PMH of MRSA)/left elbow   Chronic kidney disease    resolved   Diverticulosis    Diverticulosis of colon    GERD (gastroesophageal reflux disease)    OCC HEARTBURN   Gilbert's syndrome    Hematuria 2009   Dr Excell   Hyperglycemia    Hyperlipidemia    Hypertension    Lung cancer, lower lobe (HCC)    Stage IIA (T1,N1)   Lymphoma (HCC)    Low grad   Neuromuscular disorder (HCC)    2010 TEMPORARY MEMORY LOSS 1-1.5 HR WAS CHECKED OUT AT [facility] , NOTHING FOUND   Peptic ulcer 1995   Peyronie disease    Preventative health care 12/19/2015   Primary cancer of left lower lobe of lung (HCC) 11/27/2011   Recurrent upper respiratory infection (URI)    09/2011  BRONCHITIS   SCC (squamous cell carcinoma) 01/21/2009   Qualifier: Diagnosis of  By: Tish MD, William   ? Basal Cell      Past  Surgical History:  Procedure Laterality Date   chemo treatment      4 treatments that last 7 hours each/last treatmebt in 2013   COLONOSCOPY  2015   COLONOSCOPY W/ POLYPECTOMY  2005   due 2015   CYSTOSCOPY  2009   LOBECTOMY  11/02/2011   Procedure: LOBECTOMY;  Surgeon: Elspeth JAYSON Millers, MD;  [facility] OR;  Service: Thoracic;  Laterality: Left;  LEFT LOWER LOBECTOMY   REFRACTIVE SURGERY     Bil   SKIN BIOPSY     4 0r 5   Squamous Cell Cancer Excision  12/13/2016   SUPRACLAVICAL NODE BIOPSY Left 10/15/2014   Procedure: LEFT SUPRACLAVICAL LYMPH NODE BIOPSY;  Surgeon: Elspeth JAYSON Millers, MD;  Location: [facility] OR;  Service: Thoracic;  Laterality: Left;  LEFT SUPRACLAVICAL LYMPH NODE BIOPSY   VASECTOMY     VEIN SURGERY     R ankle    Family History  Problem Relation Age of Onset   Drug abuse Mother    Stroke Mother        in 48s   Hypertension Mother    Breast cancer Mother    Neuropathy Mother    Melanoma Father    Heart attack Father 34  stents   Dementia Father    Cancer Sister        multiple myeloma   Hypertension Sister    Thyroid  disease Sister        ??   Multiple myeloma Sister    Headache Daughter    Diabetes Paternal Aunt    Stomach cancer Maternal Grandfather 71   Lung cancer Maternal Grandfather    Cancer Paternal Grandmother        lung, nonsmoker   Colon cancer Neg Hx    Colon polyps Neg Hx    Esophageal cancer Neg Hx    Rectal cancer Neg Hx     Social History   Socioeconomic History   Marital status: Married    Spouse name: Not on file   Number of children: Not on file   Years of education: Not on file   Highest education level: Not on file  Occupational History   Occupation: retired  Tobacco Use   Smoking status: Former    Current packs/day: 0.00    Average packs/day: 1 pack/day for 15.0 years (15.0 ttl pk-yrs)    Types: Cigarettes    Start date: 07/30/1964    Quit date: 07/31/1979    Years since quitting: 43.9   Smokeless tobacco:  Former    Types: Associate Professor status: Never Used  Substance and Sexual Activity   Alcohol use: Yes    Comment: Daily 3 glasses of vodka   Drug use: No   Sexual activity: Not Currently    Comment: lives with wife, retired from [employer], ran distribution. No dietary restrictions.  Other Topics Concern   Not on file  Social History Narrative   Reg exercise   Social Determinants of Health   Financial Resource Strain: Low Risk  (03/20/2023)   Overall Financial Resource Strain (CARDIA)    Difficulty of Paying Living Expenses: Not hard at all  Food Insecurity: No Food Insecurity (03/20/2023)   Hunger Vital Sign    Worried About Running Out of Food in the Last Year: Never true    Ran Out of Food in the Last Year: Never true  Transportation Needs: No Transportation Needs (03/20/2023)   PRAPARE - Administrator, Civil Service (Medical): No    Lack of Transportation (Non-Medical): No  Physical Activity: Inactive (03/20/2023)   Exercise Vital Sign    Days of Exercise per Week: 0 days    Minutes of Exercise per Session: 0 min  Stress: No Stress Concern Present (03/20/2023)   [employer] of Occupational Health - Occupational Stress Questionnaire    Feeling of Stress : Not at all  Social Connections: Moderately Integrated (03/20/2023)   Social Connection and Isolation Panel [NHANES]    Frequency of Communication with Friends and Family: Twice a week    Frequency of Social Gatherings with Friends and Family: Once a week    Attends Religious Services: More than 4 times per year    Active Member of [employer] or Organizations: No    Attends Banker Meetings: Never    Marital Status: Married  Catering manager Violence: Not At Risk (03/20/2023)   Humiliation, Afraid, Rape, and Kick questionnaire    Fear of Current or Ex-Partner: No    Emotionally Abused: No    Physically Abused: No    Sexually Abused: No    Outpatient Medications Prior to Visit   Medication Sig Dispense Refill   chlorthalidone  (HYGROTON ) 25 MG tablet  TAKE 1 TABLET DAILY 90 tablet 1   diltiazem  (CARDIZEM  CD) 240 MG 24 hr capsule TAKE 1 CAPSULE DAILY 90 capsule 1   KLOR-CON  M10 10 MEQ tablet TAKE 1 TABLET DAILY 30 tablet 3   losartan  (COZAAR ) 100 MG tablet TAKE 1/2 TABLET DAILY 45 tablet 1   metoprolol  tartrate (LOPRESSOR ) 25 MG tablet TAKE 1 TABLET TWICE A DAY 180 tablet 2   mometasone  (ELOCON ) 0.1 % cream Apply 1 application topically 2 (two) times daily as needed. Dr Kostuc[facility]ko[facility] [place]     No facility-administered medications prior to visit.    Allergies  Allergen Reactions   Lisinopril  Cough    Review of Systems  Constitutional:  Negative for chills, fever and malaise/fatigue.  HENT:  Negative for congestion and hearing loss.   Eyes:  Negative for discharge.  Respiratory:  Negative for cough, sputum production and shortness of breath.   Cardiovascular:  Negative for chest pain, palpitations and leg swelling.  Gastrointestinal:  Negative for abdominal pain, blood in stool, constipation, diarrhea, heartburn, nausea and vomiting.  Genitourinary:  Negative for dysuria, frequency, hematuria and urgency.  Musculoskeletal:  Negative for back pain, falls and myalgias.  Skin:  Negative for rash.  Neurological:  Negative for dizziness, sensory change, loss of consciousness, weakness and headaches.  Endo/Heme/Allergies:  Negative for environmental allergies. Does not bruise/bleed easily.  Psychiatric/Behavioral:  Negative for depression and suicidal ideas. The patient is not nervous/anxious and does not have insomnia.        Objective:    Physical Exam Vitals reviewed.  Constitutional:      General: He is not in acute distress.    Appearance: Normal appearance. He is not ill-appearing or diaphoretic.  HENT:     Head: Normocephalic and atraumatic.     Right Ear: Tympanic membrane, ear canal and external ear normal. There is no  impacted cerumen.     Left Ear: Tympanic membrane, ear canal and external ear normal. There is no impacted cerumen.     Nose: Nose normal. No rhinorrhea.     Mouth/Throat:     Pharynx: Oropharynx is clear.  Eyes:     General: No scleral icterus.    Extraocular Movements: Extraocular movements intact.     Conjunctiva/sclera: Conjunctivae normal.     Pupils: Pupils are equal, round, and reactive to light.  Neck:     Thyroid : No thyroid  mass or thyroid  tenderness.  Cardiovascular:     Rate and Rhythm: Normal rate and regular rhythm.     Pulses: Normal pulses.     Heart sounds: Normal heart sounds. No murmur heard. Pulmonary:     Effort: Pulmonary effort is normal.     Breath sounds: Normal breath sounds. No wheezing.  Abdominal:     General: Bowel sounds are normal.     Palpations: Abdomen is soft. There is no mass.     Tenderness: There is no guarding.  Musculoskeletal:        General: No swelling. Normal range of motion.     Cervical back: Normal range of motion and neck supple. No rigidity.     Right lower leg: No edema.     Left lower leg: No edema.  Lymphadenopathy:     Cervical: No cervical adenopathy.  Skin:    General: Skin is warm and dry.     Findings: No rash.  Neurological:     General: No focal deficit present.     Mental Status: He is alert and oriented  to person, place, and time.     Cranial Nerves: No cranial nerve deficit.     Deep Tendon Reflexes: Reflexes normal.  Psychiatric:        Mood and Affect: Mood normal.        Behavior: Behavior normal.     BP (!) 148/80 (BP Location: Left Arm, Patient Position: Sitting, Cuff Size: Large)   Pulse 73   Temp 98.3 F (36.8 C) (Oral)   Resp 18   Ht 5' 10 (1.778 m)   Wt 232 lb 9.6 oz (105.5 kg)   SpO2 99%   BMI 33.37 kg/m  Wt Readings from Last 3 Encounters:  07/02/23 232 lb 9.6 oz (105.5 kg)  02/25/23 228 lb 3.2 oz (103.5 kg)  11/27/22 228 lb 9.6 oz (103.7 kg)    Diabetic Foot Exam - Simple   No data  filed    Lab Results  Component Value Date   WBC 11.2 (H) 07/02/2023   HGB 15.2 07/02/2023   HCT 44.2 07/02/2023   PLT 233.0 07/02/2023   GLUCOSE 108 (H) 07/02/2023   CHOL 181 07/02/2023   TRIG 265.0 (H) 07/02/2023   HDL 43.70 07/02/2023   LDLDIRECT 90.0 11/27/2022   LDLCALC 85 07/02/2023   ALT 30 07/02/2023   AST 24 07/02/2023   NA 135 07/02/2023   K 3.6 07/02/2023   CL 94 (L) 07/02/2023   CREATININE 1.30 07/02/2023   BUN 20 07/02/2023   CO2 34 (H) 07/02/2023   TSH 2.79 07/02/2023   PSA 2.70 05/28/2022   INR 1.03 10/12/2014   HGBA1C 6.0 07/02/2023   MICROALBUR <0.7 11/27/2022    Lab Results  Component Value Date   TSH 2.79 07/02/2023   Lab Results  Component Value Date   WBC 11.2 (H) 07/02/2023   HGB 15.2 07/02/2023   HCT 44.2 07/02/2023   MCV 98.3 07/02/2023   PLT 233.0 07/02/2023   Lab Results  Component Value Date   NA 135 07/02/2023   K 3.6 07/02/2023   CHLORIDE 104 02/15/2017   CO2 34 (H) 07/02/2023   GLUCOSE 108 (H) 07/02/2023   BUN 20 07/02/2023   CREATININE 1.30 07/02/2023   BILITOT 1.2 07/02/2023   ALKPHOS 48 07/02/2023   AST 24 07/02/2023   ALT 30 07/02/2023   PROT 7.2 07/02/2023   ALBUMIN 4.4 07/02/2023   CALCIUM  9.9 07/02/2023   ANIONGAP 9 02/20/2023   EGFR 49 (L) 02/15/2017   GFR 52.36 (L) 07/02/2023   Lab Results  Component Value Date   CHOL 181 07/02/2023   Lab Results  Component Value Date   HDL 43.70 07/02/2023   Lab Results  Component Value Date   LDLCALC 85 07/02/2023   Lab Results  Component Value Date   TRIG 265.0 (H) 07/02/2023   Lab Results  Component Value Date   CHOLHDL 4 07/02/2023   Lab Results  Component Value Date   HGBA1C 6.0 07/02/2023       Assessment & Plan:  Stage 3 chronic kidney disease, unspecified whether stage 3a or 3b CKD (HCC) Assessment & Plan: Hydrate and monitor    Essential hypertension Assessment & Plan: Well controlled, no changes to meds. Encouraged heart healthy diet such  as the DASH diet and exercise as tolerated.    Orders: -     Comprehensive metabolic panel -     CBC with Differential/Platelet -     TSH  Hyperglycemia Assessment & Plan: hgba1c acceptable, minimize simple carbs. Increase exercise as  tolerated.   Orders: -     Hemoglobin A1c  Hyperlipidemia, mixed Assessment & Plan: Encourage heart healthy diet such as MIND or DASH diet, increase exercise, avoid trans fats, simple carbohydrates and processed foods, consider a krill or fish or flaxseed oil cap daily.   Orders: -     Lipid panel  Preventative health care Assessment & Plan: Patient encouraged to maintain heart healthy diet, regular exercise, adequate sleep. Consider daily probiotics. Take medications as prescribed. Labs ordered and reviewed. Has aged out of screening colonoscopies.    Primary cancer of left lower lobe of lung [facility]) Assessment & Plan: Following with Dr Gatha at oncology     Assessment and Plan    Chronic Kidney Disease Stable with creatinine of 1.39. Advised to avoid over-the-counter medications without consultation due to potential kidney stress. -Continue monitoring kidney function with regular metabolic panels.  Arthritis Reports of hip pain, likely due to arthritis. Discussed the importance of regular movement to slow the progression of arthritis and maintain mobility. -Consider X-ray if pain worsens.  Low-grade Lymphoma No new symptoms or changes reported. Continues to see oncologist for regular check-ups. -Continue current management plan with oncologist.  General Health Maintenance Discussed the importance of regular physical activity, hydration, stress management, and social connections for overall health. -Encouraged to aim for 4000-8000 steps per day and consider chair yoga for increased mobility and fall prevention. -Advised to consume less processed food and more whole grains, fresh fruits and vegetables, and lean proteins. -Plan to  update lab work today to check thyroid , sugar, kidneys, etc. -Consider COVID booster shot before the holidays. -Continue with regular dental and dermatology check-ups. -Next appointment in six months.         Harlene Horton, MD

## 2023-07-04 ENCOUNTER — Encounter: Payer: Self-pay | Admitting: Family Medicine

## 2023-07-05 ENCOUNTER — Other Ambulatory Visit: Payer: Self-pay | Admitting: Family

## 2023-07-09 DIAGNOSIS — L821 Other seborrheic keratosis: Secondary | ICD-10-CM | POA: Diagnosis not present

## 2023-07-09 DIAGNOSIS — L570 Actinic keratosis: Secondary | ICD-10-CM | POA: Diagnosis not present

## 2023-07-09 DIAGNOSIS — Z85828 Personal history of other malignant neoplasm of skin: Secondary | ICD-10-CM | POA: Diagnosis not present

## 2023-07-09 DIAGNOSIS — L579 Skin changes due to chronic exposure to nonionizing radiation, unspecified: Secondary | ICD-10-CM | POA: Diagnosis not present

## 2023-07-09 DIAGNOSIS — L400 Psoriasis vulgaris: Secondary | ICD-10-CM | POA: Diagnosis not present

## 2023-07-09 DIAGNOSIS — L814 Other melanin hyperpigmentation: Secondary | ICD-10-CM | POA: Diagnosis not present

## 2023-07-09 DIAGNOSIS — D225 Melanocytic nevi of trunk: Secondary | ICD-10-CM | POA: Diagnosis not present

## 2023-07-10 ENCOUNTER — Other Ambulatory Visit: Payer: Self-pay

## 2023-07-10 ENCOUNTER — Encounter: Payer: Self-pay | Admitting: Internal Medicine

## 2023-08-02 DIAGNOSIS — N1831 Chronic kidney disease, stage 3a: Secondary | ICD-10-CM | POA: Diagnosis not present

## 2023-08-16 DIAGNOSIS — H04123 Dry eye syndrome of bilateral lacrimal glands: Secondary | ICD-10-CM | POA: Diagnosis not present

## 2023-08-16 DIAGNOSIS — H43392 Other vitreous opacities, left eye: Secondary | ICD-10-CM | POA: Diagnosis not present

## 2023-08-16 DIAGNOSIS — H3581 Retinal edema: Secondary | ICD-10-CM | POA: Diagnosis not present

## 2023-08-16 DIAGNOSIS — H43812 Vitreous degeneration, left eye: Secondary | ICD-10-CM | POA: Diagnosis not present

## 2023-08-16 DIAGNOSIS — H31091 Other chorioretinal scars, right eye: Secondary | ICD-10-CM | POA: Diagnosis not present

## 2023-08-21 ENCOUNTER — Ambulatory Visit (HOSPITAL_COMMUNITY)
Admission: RE | Admit: 2023-08-21 | Discharge: 2023-08-21 | Disposition: A | Discharge status: Home / Self Care | Payer: Medicare HMO | Source: Ambulatory Visit | Attending: Internal Medicine | Admitting: Internal Medicine

## 2023-08-21 ENCOUNTER — Other Ambulatory Visit: Payer: Self-pay | Admitting: Internal Medicine

## 2023-08-21 ENCOUNTER — Inpatient Hospital Stay: Payer: Medicare HMO | Attending: Internal Medicine

## 2023-08-21 ENCOUNTER — Encounter (HOSPITAL_COMMUNITY): Payer: Self-pay

## 2023-08-21 DIAGNOSIS — C3432 Malignant neoplasm of lower lobe, left bronchus or lung: Secondary | ICD-10-CM | POA: Diagnosis not present

## 2023-08-21 DIAGNOSIS — Z9221 Personal history of antineoplastic chemotherapy: Secondary | ICD-10-CM | POA: Diagnosis not present

## 2023-08-21 DIAGNOSIS — C3490 Malignant neoplasm of unspecified part of unspecified bronchus or lung: Secondary | ICD-10-CM | POA: Insufficient documentation

## 2023-08-21 DIAGNOSIS — E876 Hypokalemia: Secondary | ICD-10-CM | POA: Insufficient documentation

## 2023-08-21 DIAGNOSIS — N400 Enlarged prostate without lower urinary tract symptoms: Secondary | ICD-10-CM | POA: Diagnosis not present

## 2023-08-21 DIAGNOSIS — Z79899 Other long term (current) drug therapy: Secondary | ICD-10-CM | POA: Diagnosis not present

## 2023-08-21 DIAGNOSIS — C8300 Small cell B-cell lymphoma, unspecified site: Secondary | ICD-10-CM | POA: Diagnosis not present

## 2023-08-21 DIAGNOSIS — K5750 Diverticulosis of both small and large intestine without perforation or abscess without bleeding: Secondary | ICD-10-CM | POA: Diagnosis not present

## 2023-08-21 DIAGNOSIS — L409 Psoriasis, unspecified: Secondary | ICD-10-CM | POA: Diagnosis not present

## 2023-08-21 LAB — CMP (CANCER CENTER ONLY)
ALT: 23 U/L (ref 0–44)
AST: 23 U/L (ref 15–41)
Albumin: 4.2 g/dL (ref 3.5–5.0)
Alkaline Phosphatase: 48 U/L (ref 38–126)
Anion gap: 7 (ref 5–15)
BUN: 20 mg/dL (ref 8–23)
CO2: 29 mmol/L (ref 22–32)
Calcium: 9.4 mg/dL (ref 8.9–10.3)
Chloride: 97 mmol/L — ABNORMAL LOW (ref 98–111)
Creatinine: 1.39 mg/dL — ABNORMAL HIGH (ref 0.61–1.24)
GFR, Estimated: 52 mL/min — ABNORMAL LOW (ref >60)
Glucose, Bld: 172 mg/dL — ABNORMAL HIGH (ref 70–99)
Potassium: 3.1 mmol/L — ABNORMAL LOW (ref 3.5–5.1)
Sodium: 133 mmol/L — ABNORMAL LOW (ref 135–145)
Total Bilirubin: 1 mg/dL (ref 0.0–1.2)
Total Protein: 7.1 g/dL (ref 6.5–8.1)

## 2023-08-21 LAB — CBC WITH DIFFERENTIAL (CANCER CENTER ONLY)
Abs Immature Granulocytes: 0.06 10*3/uL (ref 0.00–0.07)
Basophils Absolute: 0.1 10*3/uL (ref 0.0–0.1)
Basophils Relative: 1 %
Eosinophils Absolute: 0.1 10*3/uL (ref 0.0–0.5)
Eosinophils Relative: 1 %
HCT: 41.5 % (ref 39.0–52.0)
Hemoglobin: 14.9 g/dL (ref 13.0–17.0)
Immature Granulocytes: 1 %
Lymphocytes Relative: 35 %
Lymphs Abs: 4 10*3/uL (ref 0.7–4.0)
MCH: 33 pg (ref 26.0–34.0)
MCHC: 35.9 g/dL (ref 30.0–36.0)
MCV: 91.8 fL (ref 80.0–100.0)
Monocytes Absolute: 1 10*3/uL (ref 0.1–1.0)
Monocytes Relative: 8 %
Neutro Abs: 6.4 10*3/uL (ref 1.7–7.7)
Neutrophils Relative %: 54 %
Platelet Count: 183 10*3/uL (ref 150–400)
RBC: 4.52 MIL/uL (ref 4.22–5.81)
RDW: 12.6 % (ref 11.5–15.5)
WBC Count: 11.7 10*3/uL — ABNORMAL HIGH (ref 4.0–10.5)
nRBC: 0 % (ref 0.0–0.2)

## 2023-08-21 LAB — LACTATE DEHYDROGENASE: LDH: 138 U/L (ref 98–192)

## 2023-08-21 MED ORDER — IOHEXOL 300 MG/ML  SOLN
30.0000 mL | Freq: Once | INTRAMUSCULAR | Status: AC | PRN
  Administered 2023-08-21: 30 mL via ORAL

## 2023-08-22 ENCOUNTER — Other Ambulatory Visit: Payer: Self-pay | Admitting: Family Medicine

## 2023-08-28 ENCOUNTER — Inpatient Hospital Stay (HOSPITAL_BASED_OUTPATIENT_CLINIC_OR_DEPARTMENT_OTHER): Payer: Medicare HMO | Admitting: Internal Medicine

## 2023-08-28 VITALS — BP 147/76 | HR 85 | Temp 97.6°F | Resp 16 | Ht 70.0 in | Wt 234.0 lb

## 2023-08-28 DIAGNOSIS — C3490 Malignant neoplasm of unspecified part of unspecified bronchus or lung: Secondary | ICD-10-CM | POA: Diagnosis not present

## 2023-08-28 DIAGNOSIS — Z9221 Personal history of antineoplastic chemotherapy: Secondary | ICD-10-CM | POA: Diagnosis not present

## 2023-08-28 DIAGNOSIS — Z79899 Other long term (current) drug therapy: Secondary | ICD-10-CM | POA: Diagnosis not present

## 2023-08-28 DIAGNOSIS — L409 Psoriasis, unspecified: Secondary | ICD-10-CM | POA: Diagnosis not present

## 2023-08-28 DIAGNOSIS — C3432 Malignant neoplasm of lower lobe, left bronchus or lung: Secondary | ICD-10-CM | POA: Diagnosis not present

## 2023-08-28 DIAGNOSIS — E876 Hypokalemia: Secondary | ICD-10-CM | POA: Diagnosis not present

## 2023-08-28 DIAGNOSIS — C8300 Small cell B-cell lymphoma, unspecified site: Secondary | ICD-10-CM | POA: Diagnosis not present

## 2023-08-28 NOTE — Progress Notes (Signed)
[facility] [phone]   [phone]  OFFICE PROGRESS NOTE  [name], MD [address] [place] [address]  DIAGNOSIS AND STAGE:  1) small lymphocytic lymphoma involving the left supraclavicular lymph nodes diagnosed in February 2016. 2) Stage IIA (T1a, N1, M0) non-small cell lung cancer consistent with adenocarcinoma with negative EGFR mutation diagnosed in March of 2013.    PRIOR THERAPY:   1) S/P left video-assisted thoracoscopy, wedge resection of left lower lobe nodule, thoracoscopic left lower lobectomy, mediastinal lymph node dissection on 11/02/2011.   2) Adjuvant chemotherapy with cisplatin 75 mg/M2 and Alimta 500 mg/M2 every 3 weeks. She is status post 4 cycles.    CURRENT THERAPY: Observation.  INTERVAL HISTORY: Kyle Long 78 y.o. male returns to the clinic today for 57-month follow-up visit accompanied by his wife.Discussed the use of AI scribe software for clinical note transcription with the patient, who gave verbal consent to proceed.  History of Present Illness   He is a 78 year old male with stage 2A non-small cell lung cancer and small lymphocytic lymphoma who presents for routine follow-up. He is accompanied by his wife.  He was diagnosed with stage 2A non-small cell lung cancer, adenocarcinoma, in April 2013 and underwent a wedge resection of the left lower lobe followed by four cycles of adjuvant chemotherapy. No new symptoms such as weight loss, night sweats, chest pain, shortness of breath, or cough are present. Recent imaging shows stable disease with some lymph nodes waxing and waning in size, but no significant changes noted.  He has a history of small lymphocytic lymphoma, which has been managed with observation. Recent imaging shows some lymph nodes have increased slightly in size, but he remains small and stable overall. No symptoms such as nausea, vomiting, or bleeding are reported.  He is  currently using a cream called Xerava for psoriasis, which was changed from mometasone furoate.  He is taking potassium chloride for low potassium levels, which were previously noted to be 3.1.  His blood pressure medication includes losartan potassium.       MEDICAL HISTORY: Past Medical History:  Diagnosis Date   Allergic state 06/26/2015   Amnesia 2009   isolated;for 30 mins w/elevated BP   Arthritis    Cancer (HCC)    skin / top head   Cataract    bilateral repair   Cellulitis 2011   LUE (no PMH of MRSA)/left elbow   Chronic kidney disease    resolved   Diverticulosis    Diverticulosis of colon    GERD (gastroesophageal reflux disease)    OCC HEARTBURN   Gilbert's syndrome    Hematuria 2009   Dr Merry Lofty   Hyperglycemia    Hyperlipidemia    Hypertension    Lung cancer, lower lobe (HCC)    Stage IIA (T1,N1)   Lymphoma (HCC)    Low grad   Neuromuscular disorder (HCC)    2010 TEMPORARY MEMORY LOSS 1-1.5 HR WAS CHECKED OUT AT [facility] , NOTHING FOUND   Peptic ulcer 1995   Peyronie disease    Preventative health care 12/19/2015   Primary cancer of left lower lobe of lung (HCC) 11/27/2011   Recurrent upper respiratory infection (URI)    09/2011  BRONCHITIS   SCC (squamous cell carcinoma) 01/21/2009   Qualifier: Diagnosis of  By: Alwyn Ren MD, Chrissie Noa   ? Basal Cell      ALLERGIES:  is allergic  to lisinopril.  MEDICATIONS:  Current Outpatient Medications  Medication Sig Dispense Refill   chlorthalidone (HYGROTON) 25 MG tablet TAKE 1 TABLET DAILY 90 tablet 1   diltiazem (CARDIZEM CD) 240 MG 24 hr capsule TAKE 1 CAPSULE DAILY 90 capsule 1   KLOR-CON M10 10 MEQ tablet TAKE 1 TABLET DAILY 30 tablet 3   losartan (COZAAR) 100 MG tablet TAKE 1/2 TABLET DAILY 45 tablet 1   metoprolol tartrate (LOPRESSOR) 25 MG tablet TAKE 1 TABLET TWICE A DAY 180 tablet 2   mometasone (ELOCON) 0.1 % cream Apply 1 application topically 2 (two) times daily as needed. Dr  Eilleen Kempf dermatology [facility]     No current facility-administered medications for this visit.    SURGICAL HISTORY:  Past Surgical History:  Procedure Laterality Date   chemo treatment      4 treatments that last 7 hours each/last treatmebt in 2013   COLONOSCOPY  2015   COLONOSCOPY W/ POLYPECTOMY  2005   due 2015   CYSTOSCOPY  2009   LOBECTOMY  11/02/2011   Procedure: LOBECTOMY;  Surgeon: Loreli Slot, MD;  Location: [facility] OR;  Service: Thoracic;  Laterality: Left;  LEFT LOWER LOBECTOMY   REFRACTIVE SURGERY     Bil   SKIN BIOPSY     4 0r 5   Squamous Cell Cancer Excision  12/13/2016   SUPRACLAVICAL NODE BIOPSY Left 10/15/2014   Procedure: LEFT SUPRACLAVICAL LYMPH NODE BIOPSY;  Surgeon: Loreli Slot, MD;  Location: [facility] OR;  Service: Thoracic;  Laterality: Left;  LEFT SUPRACLAVICAL LYMPH NODE BIOPSY   VASECTOMY     VEIN SURGERY     R ankle    REVIEW OF SYSTEMS:  Constitutional: negative Eyes: negative Ears, nose, mouth, throat, and face: negative Respiratory: negative Cardiovascular: negative Gastrointestinal: negative Genitourinary:negative Integument/breast: negative Hematologic/lymphatic: negative Musculoskeletal:negative Neurological: negative Behavioral/Psych: negative Endocrine: negative Allergic/Immunologic: negative   PHYSICAL EXAMINATION: General appearance: alert, cooperative and no distress Head: Normocephalic, without obvious abnormality, atraumatic Neck: no adenopathy, no JVD, supple, symmetrical, trachea midline and thyroid not enlarged, symmetric, no tenderness/mass/nodules Lymph nodes: Cervical, supraclavicular, and axillary nodes normal. Resp: clear to auscultation bilaterally Back: symmetric, no curvature. ROM normal. No CVA tenderness. Cardio: regular rate and rhythm, S1, S2 normal, no murmur, click, rub or gallop GI: soft, non-tender; bowel sounds normal; no masses,  no organomegaly Extremities: extremities normal,  atraumatic, no cyanosis or edema  ECOG PERFORMANCE STATUS: 1 - Symptomatic but completely ambulatory  Blood pressure (!) 147/76, pulse 85, temperature 97.6 F (36.4 C), temperature source Temporal, resp. rate 16, height 5\' 10"  (1.778 m), weight 234 lb (106.1 kg), SpO2 99%.  LABORATORY DATA: Lab Results  Component Value Date   WBC 11.7 (H) 08/21/2023   HGB 14.9 08/21/2023   HCT 41.5 08/21/2023   MCV 91.8 08/21/2023   PLT 183 08/21/2023      Chemistry      Component Value Date/Time   NA 133 (L) 08/21/2023 0951   NA 137 02/15/2017 1053   K 3.1 (L) 08/21/2023 0951   K 4.4 02/15/2017 1053   CL 97 (L) 08/21/2023 0951   CL 104 09/03/2012 1003   CO2 29 08/21/2023 0951   CO2 25 02/15/2017 1053   BUN 20 08/21/2023 0951   BUN 27.1 (H) 02/15/2017 1053   CREATININE 1.39 (H) 08/21/2023 0951   CREATININE 1.4 (H) 02/15/2017 1053      Component Value Date/Time   CALCIUM 9.4 08/21/2023 0951   CALCIUM 10.0 02/15/2017 1053  ALKPHOS 48 08/21/2023 0951   ALKPHOS 51 02/15/2017 1053   AST 23 08/21/2023 0951   AST 30 02/15/2017 1053   ALT 23 08/21/2023 0951   ALT 37 02/15/2017 1053   BILITOT 1.0 08/21/2023 0951   BILITOT 0.93 02/15/2017 1053       RADIOGRAPHIC STUDIES: CT CHEST ABDOMEN PELVIS WO CONTRAST Result Date: 08/21/2023 CLINICAL DATA:  Hematologic malignancy, follow-up. History of non-small cell lung cancer * Tracking Code: BO * EXAM: CT CHEST, ABDOMEN AND PELVIS WITHOUT CONTRAST TECHNIQUE: Multidetector CT imaging of the chest, abdomen and pelvis was performed following the standard protocol without IV contrast. RADIATION DOSE REDUCTION: This exam was performed according to the departmental dose-optimization program which includes automated exposure control, adjustment of the mA and/or kV according to patient size and/or use of iterative reconstruction technique. COMPARISON:  Priors including CT February 20, 2023 FINDINGS: CT CHEST FINDINGS Cardiovascular: Aortic atherosclerosis.  Normal size heart. Coronary artery calcifications. Mediastinum/Nodes: Slight interval increase in size of the mediastinal bilateral axillary and bilateral subpectoral lymph nodes. For reference: -left subpectoral lymph node measures 12 mm in short axis on image 17/2 previously 8 mm -high right paratracheal lymph node measures 8 mm in short axis on image 15/2 previously 6 mm. Lungs/Pleura: Central airways are patent. Prior left lower lobe lobectomy. Stable ground-glass right upper lobe pulmonary nodule measuring 11 x 8 mm on image 75/4. Multiple calcified and noncalcified 4 mm or smaller bilateral pulmonary nodules are stable from prior. No new suspicious pulmonary nodules or masses. Musculoskeletal: No aggressive lytic or blastic lesion of bone. Multilevel degenerative changes spine. CT ABDOMEN PELVIS FINDINGS Hepatobiliary: Stable hypodense hepatic lesion is measuring up to 2.8 cm in the lateral left lobe of the liver on image 138/55 favored to reflect cysts. Gallbladder is unremarkable. No biliary ductal dilation. Pancreas: No pancreatic ductal dilation or evidence of acute inflammation. Spleen: No splenomegaly. Adrenals/Urinary Tract: No suspicious adrenal mass. No hydronephrosis. No nephrolithiasis. Urinary bladder is unremarkable for degree of distension Stomach/Bowel: Gastric diverticulum. Stomach is minimally distended. Duodenal diverticulum. Radiopaque enteric contrast material traverses the hepatic flexure. No pathologic dilation of small or large bowel. Normal appendix. Colonic diverticulosis without findings of acute diverticulitis. Vascular/Lymphatic: Aortic atherosclerosis. Normal caliber abdominal aorta. No significant interval change in the retroperitoneal, mesenteric, iliac side chain and left inguinal lymph nodes. For reference: -lymph node in the jejunal mesentery measures 2.9 x 2.3 cm on image 90/2 previously 2.9 x 2.2 cm. -left inguinal lymph node measures 4.3 x 2.0 cm on image 123/2 previously  4.1 x 2.2 cm. Reproductive: Enlarged prostate gland. Other: Fat containing umbilical and bilateral inguinal hernias. Musculoskeletal: Multilevel degenerative changes spine. No aggressive lytic or blastic lesion of bone IMPRESSION: 1. Slight interval increase in size of the lymph nodes above the diaphragm with no significant interval change in the lymph nodes below the diaphragm 2. Stable ground-glass right upper lobe pulmonary nodule measuring 11 x 8 mm. 3. Stable calcified and noncalcified 4 mm or smaller bilateral pulmonary nodules. 4. Colonic diverticulosis without findings of acute diverticulitis. 5. Enlarged prostate gland. 6.  Aortic Atherosclerosis (ICD10-I70.0). Electronically Signed   By: Maudry Mayhew M.D.   On: 08/21/2023 12:46     ASSESSMENT AND PLAN: This is a very pleasant 78 years old white male with: 1) stage IIA non-small cell lung cancer, adenocarcinoma status post wedge resection of the left lower lobe with lymph node dissection in April 2013 followed by adjuvant systemic chemotherapy with cisplatin and Alimta.   2) small  lymphocytic lymphoma: He will continue on observation for now. The patient is currently on observation and he is feeling fine with no concerning complaints. The patient had repeat CT scan of the chest, abdomen and pelvis performed recently.  I personally and independently reviewed the scan and discussed the result with the patient and his wife today.  His scan showed no concerning finding for disease progression. Assessment and Plan    Non-Small Cell Lung Cancer (NSCLC)   Stage II A adenocarcinoma diagnosed in April 2013. Underwent wedge resection of the left lower lobe and four cycles of adjuvant chemotherapy. Current imaging shows no significant changes in lymph node size. No new symptoms such as weight loss, night sweats, chest pain, dyspnea, or hemoptysis. Minor changes in lymph node size are not clinically significant.   - Follow-up in one year   - Monitor  for new symptoms and report if he occurs    Small Lymphocytic Lymphoma (SLL)   Diagnosed with SLL. Recent imaging shows slight changes in lymph node sizes, but overall stable disease. No new symptoms reported. Minor changes in lymph node size are not clinically significant.   - Continue observation   - Follow-up in one year   - Monitor for new symptoms and report if he occurs    Psoriasis   Using Xerava cream. Discussed avoiding biologic agents that could stimulate lymphoma or lung cancer. Xerava cream is acceptable.   - Continue Xerava cream   - Avoid biologic agents    Hypokalemia   Taking potassium chloride for hypokalemia. Recent potassium level was 3.1 mEq/L. Discussed the importance of maintaining potassium levels and the role of losartan potassium.   - Continue potassium chloride supplementation   - Monitor potassium levels regularly    General Health Maintenance   No specific issues discussed.    Follow-up   - Schedule follow-up appointment in one year.   The patient was advised to call immediately if he has any other concerning symptoms in the interval. All questions were answered. The patient knows to call the clinic with any problems, questions or concerns. We can certainly see the patient much sooner if necessary.  Disclaimer: This note was dictated with voice recognition software. Similar sounding words can inadvertently be transcribed and may not be corrected upon review.

## 2023-09-06 ENCOUNTER — Other Ambulatory Visit: Payer: Self-pay | Admitting: Family Medicine

## 2023-09-06 DIAGNOSIS — I10 Essential (primary) hypertension: Secondary | ICD-10-CM

## 2023-11-04 ENCOUNTER — Other Ambulatory Visit: Payer: Self-pay | Admitting: Family Medicine

## 2023-11-14 DIAGNOSIS — H5211 Myopia, right eye: Secondary | ICD-10-CM | POA: Diagnosis not present

## 2023-11-14 DIAGNOSIS — H43393 Other vitreous opacities, bilateral: Secondary | ICD-10-CM | POA: Diagnosis not present

## 2023-11-14 DIAGNOSIS — H33003 Unspecified retinal detachment with retinal break, bilateral: Secondary | ICD-10-CM | POA: Diagnosis not present

## 2023-11-14 DIAGNOSIS — H43813 Vitreous degeneration, bilateral: Secondary | ICD-10-CM | POA: Diagnosis not present

## 2023-12-17 ENCOUNTER — Other Ambulatory Visit: Payer: Self-pay | Admitting: Family Medicine

## 2023-12-25 ENCOUNTER — Other Ambulatory Visit: Payer: Self-pay | Admitting: Family Medicine

## 2024-01-02 ENCOUNTER — Ambulatory Visit: Payer: Medicare HMO | Admitting: Family Medicine

## 2024-01-07 DIAGNOSIS — D045 Carcinoma in situ of skin of trunk: Secondary | ICD-10-CM | POA: Diagnosis not present

## 2024-01-07 DIAGNOSIS — D485 Neoplasm of uncertain behavior of skin: Secondary | ICD-10-CM | POA: Diagnosis not present

## 2024-01-07 DIAGNOSIS — L579 Skin changes due to chronic exposure to nonionizing radiation, unspecified: Secondary | ICD-10-CM | POA: Diagnosis not present

## 2024-01-07 DIAGNOSIS — D0461 Carcinoma in situ of skin of right upper limb, including shoulder: Secondary | ICD-10-CM | POA: Diagnosis not present

## 2024-01-07 DIAGNOSIS — L400 Psoriasis vulgaris: Secondary | ICD-10-CM | POA: Diagnosis not present

## 2024-01-07 DIAGNOSIS — Z85828 Personal history of other malignant neoplasm of skin: Secondary | ICD-10-CM | POA: Diagnosis not present

## 2024-01-07 DIAGNOSIS — L570 Actinic keratosis: Secondary | ICD-10-CM | POA: Diagnosis not present

## 2024-01-07 DIAGNOSIS — L821 Other seborrheic keratosis: Secondary | ICD-10-CM | POA: Diagnosis not present

## 2024-01-14 ENCOUNTER — Telehealth (INDEPENDENT_AMBULATORY_CARE_PROVIDER_SITE_OTHER): Admitting: Family Medicine

## 2024-01-14 ENCOUNTER — Encounter: Payer: Self-pay | Admitting: Family Medicine

## 2024-01-14 DIAGNOSIS — I10 Essential (primary) hypertension: Secondary | ICD-10-CM

## 2024-01-14 DIAGNOSIS — E782 Mixed hyperlipidemia: Secondary | ICD-10-CM | POA: Diagnosis not present

## 2024-01-14 DIAGNOSIS — R739 Hyperglycemia, unspecified: Secondary | ICD-10-CM | POA: Diagnosis not present

## 2024-01-14 MED ORDER — HYDROCHLOROTHIAZIDE 12.5 MG PO CAPS: 12.5000 mg | ORAL_CAPSULE | Freq: Every day | ORAL | 1 refills | Status: DC

## 2024-01-14 NOTE — Progress Notes (Signed)
 MyChart Video Visit    Virtual Visit via Video Note   This patient is at least at moderate risk for complications without adequate follow up. This format is felt to be most appropriate for this patient at this time. Physical exam was limited by quality of the video and audio technology used for the visit. Porsha, CMA was able to get the patient set up on a video visit.  Patient location: home Patient and provider in visit Provider location: Office  I discussed the limitations of evaluation and management by telemedicine and the availability of in person appointments. The patient expressed understanding and agreed to proceed.  Visit Date: 01/14/2024  Today's healthcare provider: Randie Bustle, MD     Subjective:    Patient ID: Kyle Long, male    DOB: 1945/11/26, 78 y.o.   MRN: [id number]  Chief Complaint  Patient presents with   Medical Management of Chronic Issues    Patient presents today for a 6 month follow-up.    HPI Discussed the use of AI scribe software for clinical note transcription with the patient, who gave verbal consent to proceed.  History of Present Illness Kyle Long is a 78 year old male with hypertension and chronic kidney disease who presents for medication management and routine follow-up.  He is currently taking chlorthalidone  for hypertension and Chlorcon for potassium supplementation. He has concerns about the risks associated with chlorthalidone , including low potassium and potential kidney issues, especially given his chronic kidney disease.  He maintains a diet rich in fruits and vegetables and ensures adequate hydration by drinking water throughout the day. He consumes plenty of protein and still performs his own yard work for exercise, although he acknowledges being less active than before.  He has received the Pneumovax and Prevnar 13 vaccines for pneumonia and both doses of the shingles vaccine. He is due for blood work to check his  potassium and kidney function, which is scheduled for June 23rd.  He uses a Recruitment consultant for his medications and reports no changes in his pharmacy use.  No recent emergency room visits, chest pain, or other acute issues.    Past Medical History:  Diagnosis Date   Allergic state 06/26/2015   Amnesia 2009   isolated;for 30 mins w/elevated BP   Arthritis    Cancer (HCC)    skin / top head   Cataract    bilateral repair   Cellulitis 2011   LUE (no PMH of MRSA)/left elbow   Chronic kidney disease    resolved   Diverticulosis    Diverticulosis of colon    GERD (gastroesophageal reflux disease)    OCC HEARTBURN   Gilbert's syndrome    Hematuria 2009   Dr Tonna Frederic   Hyperglycemia    Hyperlipidemia    Hypertension    Lung cancer, lower lobe (HCC)    Stage IIA (T1,N1)   Lymphoma (HCC)    Low grad   Neuromuscular disorder (HCC)    2010 TEMPORARY MEMORY LOSS 1-1.5 HR WAS CHECKED OUT AT [facility] , NOTHING FOUND   Peptic ulcer 1995   Peyronie disease    Preventative health care 12/19/2015   Primary cancer of left lower lobe of lung (HCC) 11/27/2011   Recurrent upper respiratory infection (URI)    09/2011  BRONCHITIS   SCC (squamous cell carcinoma) 01/21/2009   Qualifier: Diagnosis of  By: Donnice Gale MD, Sammie Crigler   ? Basal Cell      Past Surgical History:  Procedure Laterality Date   chemo treatment      4 treatments that last 7 hours each/last treatmebt in 2013   COLONOSCOPY  2015   COLONOSCOPY W/ POLYPECTOMY  2005   due 2015   CYSTOSCOPY  2009   LOBECTOMY  11/02/2011   Procedure: LOBECTOMY;  Surgeon: Zelphia Higashi, MD;  Location: [facility] OR;  Service: Thoracic;  Laterality: Left;  LEFT LOWER LOBECTOMY   REFRACTIVE SURGERY     Bil   SKIN BIOPSY     4 0r 5   Squamous Cell Cancer Excision  12/13/2016   SUPRACLAVICAL NODE BIOPSY Left 10/15/2014   Procedure: LEFT SUPRACLAVICAL LYMPH NODE BIOPSY;  Surgeon: Zelphia Higashi, MD;  Location: [facility] OR;  Service:  Thoracic;  Laterality: Left;  LEFT SUPRACLAVICAL LYMPH NODE BIOPSY   VASECTOMY     VEIN SURGERY     R ankle    Family History  Problem Relation Age of Onset   Drug abuse Mother    Stroke Mother        in 54s   Hypertension Mother    Breast cancer Mother    Neuropathy Mother    Melanoma Father    Heart attack Father 16       stents   Dementia Father    Cancer Sister        multiple myeloma   Hypertension Sister    Thyroid  disease Sister        ??   Multiple myeloma Sister    Headache Daughter    Diabetes Paternal Aunt    Stomach cancer Maternal Grandfather 59   Lung cancer Maternal Grandfather    Cancer Paternal Grandmother        lung, nonsmoker   Colon cancer Neg Hx    Colon polyps Neg Hx    Esophageal cancer Neg Hx    Rectal cancer Neg Hx     Social History   Socioeconomic History   Marital status: Married    Spouse name: Not on file   Number of children: Not on file   Years of education: Not on file   Highest education level: Not on file  Occupational History   Occupation: retired  Tobacco Use   Smoking status: Former    Current packs/day: 0.00    Average packs/day: 1 pack/day for 15.0 years (15.0 ttl pk-yrs)    Types: Cigarettes    Start date: 07/30/1964    Quit date: 07/31/1979    Years since quitting: 44.4   Smokeless tobacco: Former    Types: Associate Professor status: Never Used  Substance and Sexual Activity   Alcohol use: Yes    Comment: Daily 3 glasses of vodka   Drug use: No   Sexual activity: Not Currently    Comment: lives with wife, retired from [employer], ran distribution. No dietary restrictions.  Other Topics Concern   Not on file  Social History Narrative   Reg exercise   Social Drivers of Health   Financial Resource Strain: Low Risk  (03/20/2023)   Overall Financial Resource Strain (CARDIA)    Difficulty of Paying Living Expenses: Not hard at all  Food Insecurity: Low Risk  (08/02/2023)   Received from [facility]    Hunger Vital Sign    Within the past 12 months, you worried that your food would run out before you got money to buy more: Never true    Within the past 12 months, the  food you bought just didn't last and you didn't have money to get more. : Never true  Transportation Needs: No Transportation Needs (08/02/2023)   Received from [facility]   Transportation    In the past 12 months, has lack of reliable transportation kept you from medical appointments, meetings, work or from getting things needed for daily living? : No  Physical Activity: Inactive (03/20/2023)   Exercise Vital Sign    Days of Exercise per Week: 0 days    Minutes of Exercise per Session: 0 min  Stress: No Stress Concern Present (03/20/2023)   [employer] of Occupational Health - Occupational Stress Questionnaire    Feeling of Stress : Not at all  Social Connections: Moderately Integrated (03/20/2023)   Social Connection and Isolation Panel    Frequency of Communication with Friends and Family: Twice a week    Frequency of Social Gatherings with Friends and Family: Once a week    Attends Religious Services: More than 4 times per year    Active Member of [employer] or Organizations: No    Attends Banker Meetings: Never    Marital Status: Married  Catering manager Violence: Not At Risk (03/20/2023)   Humiliation, Afraid, Rape, and Kick questionnaire    Fear of Current or Ex-Partner: No    Emotionally Abused: No    Physically Abused: No    Sexually Abused: No    Outpatient Medications Prior to Visit  Medication Sig Dispense Refill   diltiazem  (CARDIZEM  CD) 240 MG 24 hr capsule TAKE 1 CAPSULE DAILY 90 capsule 1   losartan  (COZAAR ) 100 MG tablet TAKE 1/2 TABLET DAILY 45 tablet 1   metoprolol  tartrate (LOPRESSOR ) 25 MG tablet TAKE 1 TABLET TWICE A DAY 180 tablet 2   mometasone  (ELOCON ) 0.1 % cream Apply 1 application topically 2 (two) times daily as needed. Dr Clancy Crimes dermatology [facility]      potassium chloride  (KLOR-CON  M10) 10 MEQ tablet TAKE 1 TABLET DAILY 90 tablet 0   chlorthalidone  (HYGROTON ) 25 MG tablet TAKE 1 TABLET DAILY 90 tablet 1   No facility-administered medications prior to visit.    Allergies  Allergen Reactions   Lisinopril  Cough    Review of Systems  Constitutional:  Negative for fever and malaise/fatigue.  HENT:  Negative for congestion.   Eyes:  Negative for blurred vision.  Respiratory:  Negative for shortness of breath.   Cardiovascular:  Negative for chest pain, palpitations and leg swelling.  Gastrointestinal:  Negative for abdominal pain, blood in stool and nausea.  Genitourinary:  Negative for dysuria and frequency.  Musculoskeletal:  Negative for falls.  Skin:  Negative for rash.  Neurological:  Negative for dizziness, loss of consciousness and headaches.  Endo/Heme/Allergies:  Negative for environmental allergies.  Psychiatric/Behavioral:  Negative for depression. The patient is not nervous/anxious.        Objective:    Physical Exam Constitutional:      General: He is not in acute distress.    Appearance: Normal appearance. He is not ill-appearing or toxic-appearing.  HENT:     Head: Normocephalic and atraumatic.     Right Ear: External ear normal.     Left Ear: External ear normal.     Nose: Nose normal.   Eyes:     General:        Right eye: No discharge.        Left eye: No discharge.   Pulmonary:     Effort: Pulmonary effort is normal.  Skin:    Findings: No rash.   Neurological:     Mental Status: He is alert and oriented to person, place, and time.   Psychiatric:        Behavior: Behavior normal.     There were no vitals taken for this visit. Wt Readings from Last 3 Encounters:  08/28/23 234 lb (106.1 kg)  07/02/23 232 lb 9.6 oz (105.5 kg)  02/25/23 228 lb 3.2 oz (103.5 kg)       Assessment & Plan:  Hyperlipidemia, mixed -     Lipid panel; Future  Essential hypertension -     TSH; Future -      COMPLETE METABOLIC PANEL WITHOUT GFR; Future -     CBC with Differential/Platelet; Future  Hyperglycemia -     TSH; Future -     Hemoglobin A1c; Future  Other orders -     hydroCHLOROthiazide; Take 1 capsule (12.5 mg total) by mouth daily.  Dispense: 90 capsule; Refill: 1     Assessment and Plan Assessment & Plan Chronic Kidney Disease (CKD) Stage 3 CKD, unspecified subtype. Chlorthalidone  poses increased renal risk compared to hydrochlorothiazide. Emphasized monitoring renal function and hydration due to decreased renal reserve with aging. - Order blood work to assess kidney function and potassium levels. - Switch from chlorthalidone  to hydrochlorothiazide 12.5 mg daily. - Recheck blood work in 2-4 weeks after starting hydrochlorothiazide.  Hypertension Essential hypertension managed with chlorthalidone , transitioning to hydrochlorothiazide due to renal and potassium concerns. Discussed hypertension management to prevent cerebrovascular and cardiovascular events. - Switch from chlorthalidone  to hydrochlorothiazide 12.5 mg daily. - Monitor blood pressure and potassium levels in 2-4 weeks after starting hydrochlorothiazide. - Adjust hydrochlorothiazide dose to 25 mg if blood pressure is not adequately controlled.  Hypokalemia Low potassium levels likely due to chlorthalidone . Plan to switch to hydrochlorothiazide, which may have a lower risk of hypokalemia. - Take Klor-con  M10 daily until hydrochlorothiazide is initiated. - Discontinue Klor-con  M10 after starting hydrochlorothiazide. - Monitor for hypokalemia symptoms, and restart Klor-con  M10 if symptoms occur.  General Health Maintenance Emphasized physical activity, hydration, and diet for overall health. Highlighted benefits of regular movement for joint, brain, and kidney health. - Encourage daily physical activity, aiming for 3,000-4,000 steps per day. - Ensure adequate protein intake paired with carbohydrates. - Maintain  hydration, especially during hot weather.  Vaccinations Re[facility]ingles [facility], but documentation is incomplete. Discussed Prevnar 20 vaccine for pneumonia. - Verify documentation of shingles vaccines. - Consider receiving Prevnar 20 vaccine at the pharmacy.  Follow-up Plan for follow-up to monitor medication changes and ensure proper management of conditions. - Schedule follow-up appointment in September to review new medication regimen. - Nurse to arrange blood pressure and blood work check in 2-4 weeks after starting hydrochlorothiazide. - Ensure physical is scheduled for December.     I discussed the assessment and treatment plan with the patient. The patient was provided an opportunity to ask questions and all were answered. The patient agreed with the plan and demonstrated an understanding of the instructions.   The patient was advised to call back or seek an in-person evaluation if the symptoms worsen or if the condition fails to improve as anticipated.  Randie Bustle, MD [facility] at [facility] [phone] (phone) [phone] (fax)  [facility]

## 2024-01-14 NOTE — Patient Instructions (Addendum)
 Drop the Chlorthalidone  to every other day until the hydrochlorothiazide arrives Then stop the Chlorthalidone  and start the hydrochlorothiazide daily  Take potassium (Klor Con) every day until you switch to the hydrochlorothiazide then stop the potassium If you develop palpitations or muscle cramps start the potassium back and let us  know so we can check labs Then in 2-4 weeks we will bring you to the office for a blood pressure check and blood work to check on the potassium   Prevnar 20 is the pneumonia vaccine you should consider getting at pharmacy

## 2024-01-20 ENCOUNTER — Ambulatory Visit: Payer: Self-pay | Admitting: Family Medicine

## 2024-01-20 ENCOUNTER — Other Ambulatory Visit (INDEPENDENT_AMBULATORY_CARE_PROVIDER_SITE_OTHER)

## 2024-01-20 ENCOUNTER — Other Ambulatory Visit (HOSPITAL_BASED_OUTPATIENT_CLINIC_OR_DEPARTMENT_OTHER): Payer: Self-pay

## 2024-01-20 DIAGNOSIS — E782 Mixed hyperlipidemia: Secondary | ICD-10-CM

## 2024-01-20 DIAGNOSIS — I10 Essential (primary) hypertension: Secondary | ICD-10-CM

## 2024-01-20 DIAGNOSIS — R739 Hyperglycemia, unspecified: Secondary | ICD-10-CM

## 2024-01-20 LAB — CBC WITH DIFFERENTIAL/PLATELET
Basophils Absolute: 0 10*3/uL (ref 0.0–0.1)
Basophils Relative: 0.4 % (ref 0.0–3.0)
Eosinophils Absolute: 0.1 10*3/uL (ref 0.0–0.7)
Eosinophils Relative: 0.6 % (ref 0.0–5.0)
HCT: 43.3 % (ref 39.0–52.0)
Hemoglobin: 14.7 g/dL (ref 13.0–17.0)
Lymphocytes Relative: 34.9 % (ref 12.0–46.0)
Lymphs Abs: 4.1 10*3/uL — ABNORMAL HIGH (ref 0.7–4.0)
MCHC: 34 g/dL (ref 30.0–36.0)
MCV: 95 fl (ref 78.0–100.0)
Monocytes Absolute: 1 10*3/uL (ref 0.1–1.0)
Monocytes Relative: 8.4 % (ref 3.0–12.0)
Neutro Abs: 6.5 10*3/uL (ref 1.4–7.7)
Neutrophils Relative %: 55.7 % (ref 43.0–77.0)
Platelets: 201 10*3/uL (ref 150.0–400.0)
RBC: 4.56 Mil/uL (ref 4.22–5.81)
RDW: 12.8 % (ref 11.5–15.5)
WBC: 11.6 10*3/uL — ABNORMAL HIGH (ref 4.0–10.5)

## 2024-01-20 LAB — LIPID PANEL
Cholesterol: 169 mg/dL (ref 0–200)
HDL: 39.7 mg/dL (ref >39.00)
LDL Cholesterol: 78 mg/dL (ref 0–99)
NonHDL: 128.97
Total CHOL/HDL Ratio: 4
Triglycerides: 253 mg/dL — ABNORMAL HIGH (ref 0.0–149.0)
VLDL: 50.6 mg/dL — ABNORMAL HIGH (ref 0.0–40.0)

## 2024-01-20 LAB — TSH: TSH: 2.19 u[IU]/mL (ref 0.35–5.50)

## 2024-01-20 LAB — HEMOGLOBIN A1C: Hgb A1c MFr Bld: 6.1 % (ref 4.6–6.5)

## 2024-01-20 MED ORDER — PREVNAR 20 0.5 ML IM SUSY
0.5000 mL | PREFILLED_SYRINGE | Freq: Once | INTRAMUSCULAR | 0 refills | Status: AC
  Filled 2024-01-20: qty 0.5, 1d supply, fill #0

## 2024-01-21 LAB — COMPLETE METABOLIC PANEL WITHOUT GFR
AG Ratio: 1.8 (calc) (ref 1.0–2.5)
ALT: 21 U/L (ref 9–46)
AST: 20 U/L (ref 10–35)
Albumin: 4.4 g/dL (ref 3.6–5.1)
Alkaline phosphatase (APISO): 45 U/L (ref 35–144)
BUN/Creatinine Ratio: 18 (calc) (ref 6–22)
BUN: 27 mg/dL — ABNORMAL HIGH (ref 7–25)
CO2: 29 mmol/L (ref 20–32)
Calcium: 9.9 mg/dL (ref 8.6–10.3)
Chloride: 96 mmol/L — ABNORMAL LOW (ref 98–110)
Creat: 1.49 mg/dL — ABNORMAL HIGH (ref 0.70–1.28)
Globulin: 2.5 g/dL (ref 1.9–3.7)
Glucose, Bld: 111 mg/dL — ABNORMAL HIGH (ref 65–99)
Potassium: 4.1 mmol/L (ref 3.5–5.3)
Sodium: 136 mmol/L (ref 135–146)
Total Bilirubin: 1 mg/dL (ref 0.2–1.2)
Total Protein: 6.9 g/dL (ref 6.1–8.1)

## 2024-02-11 DIAGNOSIS — L570 Actinic keratosis: Secondary | ICD-10-CM | POA: Diagnosis not present

## 2024-02-11 DIAGNOSIS — D045 Carcinoma in situ of skin of trunk: Secondary | ICD-10-CM | POA: Diagnosis not present

## 2024-02-11 DIAGNOSIS — D0461 Carcinoma in situ of skin of right upper limb, including shoulder: Secondary | ICD-10-CM | POA: Diagnosis not present

## 2024-02-12 ENCOUNTER — Telehealth: Payer: Self-pay | Admitting: Family Medicine

## 2024-02-12 ENCOUNTER — Other Ambulatory Visit: Payer: Self-pay

## 2024-02-12 DIAGNOSIS — I10 Essential (primary) hypertension: Secondary | ICD-10-CM

## 2024-02-12 NOTE — Telephone Encounter (Signed)
 Order in.

## 2024-02-12 NOTE — Telephone Encounter (Signed)
 Good afternoon this pt needs a lab order

## 2024-02-13 ENCOUNTER — Other Ambulatory Visit (INDEPENDENT_AMBULATORY_CARE_PROVIDER_SITE_OTHER)

## 2024-02-13 ENCOUNTER — Ambulatory Visit

## 2024-02-13 ENCOUNTER — Ambulatory Visit: Payer: Self-pay | Admitting: Family Medicine

## 2024-02-13 DIAGNOSIS — I10 Essential (primary) hypertension: Secondary | ICD-10-CM | POA: Diagnosis not present

## 2024-02-13 LAB — COMPREHENSIVE METABOLIC PANEL WITH GFR
ALT: 23 U/L (ref 0–53)
AST: 23 U/L (ref 0–37)
Albumin: 4.1 g/dL (ref 3.5–5.2)
Alkaline Phosphatase: 48 U/L (ref 39–117)
BUN: 23 mg/dL (ref 6–23)
CO2: 32 meq/L (ref 19–32)
Calcium: 9.4 mg/dL (ref 8.4–10.5)
Chloride: 94 meq/L — ABNORMAL LOW (ref 96–112)
Creatinine, Ser: 1.28 mg/dL (ref 0.40–1.50)
GFR: 53.11 mL/min — ABNORMAL LOW (ref >60.00)
Glucose, Bld: 173 mg/dL — ABNORMAL HIGH (ref 70–99)
Potassium: 3.5 meq/L (ref 3.5–5.1)
Sodium: 136 meq/L (ref 135–145)
Total Bilirubin: 1.1 mg/dL (ref 0.2–1.2)
Total Protein: 6.8 g/dL (ref 6.0–8.3)

## 2024-02-13 NOTE — Progress Notes (Signed)
 BP Readings from Last 3 Encounters:  08/28/23 (!) 147/76  07/02/23 (!) 148/80  03/20/23 138/75   Here for BP check per orders on video visit from 01/14/24:  - Nurse to arrange blood pressure and blood work check in 2-4 weeks after starting hydrochlorothiazide .   BP today is 152/84 with a pulse of 77  Repeated BP 140 /82   Patient advised per Dr. Domenica ok to continue same management and follow up as scheduled.

## 2024-02-17 DIAGNOSIS — H35372 Puckering of macula, left eye: Secondary | ICD-10-CM | POA: Diagnosis not present

## 2024-02-17 DIAGNOSIS — H3581 Retinal edema: Secondary | ICD-10-CM | POA: Diagnosis not present

## 2024-02-17 DIAGNOSIS — H04123 Dry eye syndrome of bilateral lacrimal glands: Secondary | ICD-10-CM | POA: Diagnosis not present

## 2024-02-17 DIAGNOSIS — H43392 Other vitreous opacities, left eye: Secondary | ICD-10-CM | POA: Diagnosis not present

## 2024-02-17 DIAGNOSIS — H43812 Vitreous degeneration, left eye: Secondary | ICD-10-CM | POA: Diagnosis not present

## 2024-02-17 DIAGNOSIS — H31091 Other chorioretinal scars, right eye: Secondary | ICD-10-CM | POA: Diagnosis not present

## 2024-03-04 ENCOUNTER — Other Ambulatory Visit: Payer: Self-pay | Admitting: Family Medicine

## 2024-03-04 DIAGNOSIS — I10 Essential (primary) hypertension: Secondary | ICD-10-CM

## 2024-03-24 ENCOUNTER — Ambulatory Visit (INDEPENDENT_AMBULATORY_CARE_PROVIDER_SITE_OTHER)

## 2024-03-24 VITALS — Ht 70.0 in | Wt 234.0 lb

## 2024-03-24 DIAGNOSIS — Z0000 Encounter for general adult medical examination without abnormal findings: Secondary | ICD-10-CM | POA: Diagnosis not present

## 2024-03-24 NOTE — Patient Instructions (Signed)
 [name] , Thank you for taking time out of your busy schedule to complete your Annual Wellness Visit with me. I enjoyed our conversation and look forward to speaking with you again next year. I, as well as your care team,  appreciate your ongoing commitment to your health goals. Please review the following plan we discussed and let me know if I can assist you in the future. Your Game plan/ To Do List     Follow up Visits: We will see or speak with you next year for your Next Medicare AWV with our clinical staff Have you seen your provider in the last 6 months (3 months if uncontrolled diabetes)? Yes  Clinician Recommendations:  Aim for 30 minutes of exercise or brisk walking, 6-8 glasses of water, and 5 servings of fruits and vegetables each day.       This is a list of the screenings recommended for you:  Health Maintenance  Topic Date Due   Flu Shot  02/28/2024   COVID-19 Vaccine (7 - 2024-25 season) 07/29/2024*   Colon Cancer Screening  01/05/2025   Medicare Annual Wellness Visit  03/24/2025   DTaP/Tdap/Td vaccine (4 - Td or Tdap) 12/12/2032   Pneumococcal Vaccine for age over 15  Completed   Hepatitis C Screening  Completed   Zoster (Shingles) Vaccine  Completed   HPV Vaccine  Aged Out   Meningitis B Vaccine  Aged Out  *Topic was postponed. The date shown is not the original due date.    Advanced directives: (In Chart) A copy of your advanced directives are scanned into your chart should your provider ever need it.  Advance Care Planning is important because it:  [x]  Makes sure you receive the medical care that is consistent with your values, goals, and preferences  [x]  It provides guidance to your family and loved ones and reduces their decisional burden about whether or not they are making the right decisions based on your wishes.  Follow the link provided in your after visit summary or read over the paperwork we have mailed to you to help you started getting your  Advance Directives in place. If you need assistance in completing these, please reach out to us  so that we can help you!  See attachments for Preventive Care and Fall Prevention Tips.

## 2024-03-24 NOTE — Progress Notes (Signed)
 Subjective:   Kyle Long is a 78 y.o. who presents for a Medicare Wellness preventive visit.  As a reminder, Annual Wellness Visits don't include a physical exam, and some assessments may be limited, especially if this visit is performed virtually. We may recommend an in-person follow-up visit with your provider if needed.  Visit Complete: Virtual I connected with  [name] on 03/24/24 by a audio enabled telemedicine application and verified that I am speaking with the correct person using two identifiers.  Patient Location: Home  Provider Location: Home Office  I discussed the limitations of evaluation and management by telemedicine. The patient expressed understanding and agreed to proceed.  Vital Signs: Because this visit was a virtual/telehealth visit, some criteria may be missing or patient reported. Any vitals not documented were not able to be obtained and vitals that have been documented are patient reported.  VideoDeclined- This patient declined Librarian, academic. Therefore the visit was completed with audio only.  Persons Participating in Visit: Patient.  AWV Questionnaire: No: Patient Medicare AWV questionnaire was not completed prior to this visit.  Cardiac Risk Factors include: advanced age (>14men, >13 women);dyslipidemia;male gender;hypertension     Objective:    Today's Vitals   03/24/24 1134  Weight: 234 lb (106.1 kg)  Height: 5' 10 (1.778 m)   Body mass index is 33.58 kg/m.     03/24/2024   11:43 AM 03/20/2023    9:02 AM 03/14/2022    9:02 AM 03/08/2021    9:01 AM 08/26/2020    4:36 PM 03/07/2020    1:56 PM 02/24/2020   10:49 AM  Advanced Directives  Does Patient Have a Medical Advance Directive? Yes Yes Yes Yes Yes Yes Yes  Type of Estate agent of [place];Living will Healthcare Power of [place];Living will Healthcare Power of [place];Living will Healthcare Power of [place];Living will  Living will Healthcare Power of [place];Living will Living will;Healthcare Power of Attorney  Does patient want to make changes to medical advance directive? No - Patient declined No - Patient declined No - Patient declined  No - Patient declined No - Patient declined   Copy of Healthcare Power of Attorney in Chart? Yes - validated most recent copy scanned in chart (See row information) Yes - validated most recent copy scanned in chart (See row information) Yes - validated most recent copy scanned in chart (See row information) Yes - validated most recent copy scanned in chart (See row information)  Yes - validated most recent copy scanned in chart (See row information)     Current Medications (verified) Outpatient Encounter Medications as of 03/24/2024  Medication Sig   diltiazem  (CARDIZEM  CD) 240 MG 24 hr capsule TAKE 1 CAPSULE DAILY   hydrochlorothiazide  (MICROZIDE ) 12.5 MG capsule Take 1 capsule (12.5 mg total) by mouth daily.   losartan  (COZAAR ) 100 MG tablet TAKE 1/2 TABLET DAILY   metoprolol  tartrate (LOPRESSOR ) 25 MG tablet TAKE 1 TABLET TWICE A DAY   mometasone  (ELOCON ) 0.1 % cream Apply 1 application topically 2 (two) times daily as needed. Dr Nechama [facility] [place]   potassium chloride  (KLOR-CON  M10) 10 MEQ tablet TAKE 1 TABLET DAILY (Patient not taking: Reported on 03/24/2024)   No facility-administered encounter medications on file as of 03/24/2024.    Allergies (verified) Lisinopril    History: Past Medical History:  Diagnosis Date   Allergic state 06/26/2015   Amnesia 2009   isolated;for 30 mins w/elevated BP   Arthritis    Cancer (  HCC)    skin / top head   Cataract    bilateral repair   Cellulitis 2011   LUE (no PMH of MRSA)/left elbow   Chronic kidney disease    resolved   Diverticulosis    Diverticulosis of colon    GERD (gastroesophageal reflux disease)    OCC HEARTBURN   Gilbert's syndrome    Hematuria 2009   Dr Excell    Hyperglycemia    Hyperlipidemia    Hypertension    Lung cancer, lower lobe (HCC)    Stage IIA (T1,N1)   Lymphoma (HCC)    Low grad   Neuromuscular disorder (HCC)    2010 TEMPORARY MEMORY LOSS 1-1.5 HR WAS CHECKED OUT AT [facility] , NOTHING FOUND   Peptic ulcer 1995   Peyronie disease    Preventative health care 12/19/2015   Primary cancer of left lower lobe of lung (HCC) 11/27/2011   Recurrent upper respiratory infection (URI)    09/2011  BRONCHITIS   SCC (squamous cell carcinoma) 01/21/2009   Qualifier: Diagnosis of  By: Tish MD, William   ? Basal Cell     Past Surgical History:  Procedure Laterality Date   chemo treatment      4 treatments that last 7 hours each/last treatmebt in 2013   COLONOSCOPY  2015   COLONOSCOPY W/ POLYPECTOMY  2005   due 2015   CYSTOSCOPY  2009   LOBECTOMY  11/02/2011   Procedure: LOBECTOMY;  Surgeon: Elspeth JAYSON Millers, MD;  Location: [facility] OR;  Service: Thoracic;  Laterality: Left;  LEFT LOWER LOBECTOMY   REFRACTIVE SURGERY     Bil   SKIN BIOPSY     4 0r 5   Squamous Cell Cancer Excision  12/13/2016   SUPRACLAVICAL NODE BIOPSY Left 10/15/2014   Procedure: LEFT SUPRACLAVICAL LYMPH NODE BIOPSY;  Surgeon: Elspeth JAYSON Millers, MD;  Location: [facility] OR;  Service: Thoracic;  Laterality: Left;  LEFT SUPRACLAVICAL LYMPH NODE BIOPSY   VASECTOMY     VEIN SURGERY     R ankle   Family History  Problem Relation Age of Onset   Drug abuse Mother    Stroke Mother        in 66s   Hypertension Mother    Breast cancer Mother    Neuropathy Mother    Melanoma Father    Heart attack Father 25       stents   Dementia Father    Cancer Sister        multiple myeloma   Hypertension Sister    Thyroid  disease Sister        ??   Multiple myeloma Sister    Headache Daughter    Diabetes Paternal Aunt    Stomach cancer Maternal Grandfather 19   Lung cancer Maternal Grandfather    Cancer Paternal Grandmother        lung, nonsmoker   Colon cancer Neg Hx    Colon  polyps Neg Hx    Esophageal cancer Neg Hx    Rectal cancer Neg Hx    Social History   Socioeconomic History   Marital status: Married    Spouse name: Not on file   Number of children: Not on file   Years of education: Not on file   Highest education level: Not on file  Occupational History   Occupation: retired  Tobacco Use   Smoking status: Former    Current packs/day: 0.00    Average packs/day: 1 pack/day for 15.0  years (15.0 ttl pk-yrs)    Types: Cigarettes    Start date: 07/30/1964    Quit date: 07/31/1979    Years since quitting: 44.6   Smokeless tobacco: Former    Types: Associate Professor status: Never Used  Substance and Sexual Activity   Alcohol use: Yes    Comment: Daily 3 glasses of vodka   Drug use: No   Sexual activity: Not Currently    Comment: lives with wife, retired from [employer], ran distribution. No dietary restrictions.  Other Topics Concern   Not on file  Social History Narrative   Reg exercise   Social Drivers of Health   Financial Resource Strain: Low Risk  (03/24/2024)   Overall Financial Resource Strain (CARDIA)    Difficulty of Paying Living Expenses: Not hard at all  Food Insecurity: No Food Insecurity (03/24/2024)   Hunger Vital Sign    Worried About Running Out of Food in the Last Year: Never true    Ran Out of Food in the Last Year: Never true  Transportation Needs: No Transportation Needs (03/24/2024)   PRAPARE - Administrator, Civil Service (Medical): No    Lack of Transportation (Non-Medical): No  Physical Activity: Insufficiently Active (03/24/2024)   Exercise Vital Sign    Days of Exercise per Week: 3 days    Minutes of Exercise per Session: 30 min  Stress: No Stress Concern Present (03/24/2024)   [employer] of Occupational Health - Occupational Stress Questionnaire    Feeling of Stress: Not at all  Social Connections: Moderately Integrated (03/24/2024)   Social Connection and Isolation Panel     Frequency of Communication with Friends and Family: Twice a week    Frequency of Social Gatherings with Friends and Family: Twice a week    Attends Religious Services: More than 4 times per year    Active Member of [employer] or Organizations: No    Attends Banker Meetings: Never    Marital Status: Married    Tobacco Counseling Counseling given: Not Answered    Clinical Intake:  Pre-visit preparation completed: Yes  Pain : No/denies pain  Diabetes: No  Lab Results  Component Value Date   HGBA1C 6.1 01/20/2024   HGBA1C 6.0 07/02/2023   HGBA1C 6.0 05/28/2022     How often do you need to have someone help you when you read instructions, pamphlets, or other written materials from your doctor or pharmacy?: 1 - Never  Interpreter Needed?: No  Information entered by :: Charmaine Bloodgood LPN   Activities of Daily Living     03/24/2024   11:44 AM  In your present state of health, do you have any difficulty performing the following activities:  Hearing? 0  Vision? 0  Difficulty concentrating or making decisions? 0  Walking or climbing stairs? 0  Dressing or bathing? 0  Doing errands, shopping? 0  Preparing Food and eating ? N  Using the Toilet? N  In the past six months, have you accidently leaked urine? N  Do you have problems with loss of bowel control? N  Managing your Medications? N  Managing your Finances? N  Housekeeping or managing your Housekeeping? N    Patient Care Team: Domenica Harlene LABOR, MD as PCP - General (Family Medicine) Sherrod Sherrod, MD as Consulting Physician (Oncology) Claudine Mt, MD as Referring Physician (Dermatopathology) Cleotilde Sewer, OD as Consulting Physician (Optometry)  I have updated your Care Teams any recent Medical  Services you may have received from other providers in the past year.     Assessment:   This is a routine wellness examination for [place].  Hearing/Vision screen Hearing Screening - Comments:: Denies  hearing difficulties   Vision Screening - Comments:: Wears rx glasses - up to date with routine eye exams with [facility]    Goals Addressed             This Visit's Progress    Remain active and independent   On track      Depression Screen     03/24/2024   11:42 AM 07/02/2023   10:02 AM 03/20/2023    9:09 AM 11/27/2022   10:46 AM 05/28/2022   10:27 AM 03/14/2022    9:03 AM 03/08/2021    9:03 AM  PHQ 2/9 Scores  PHQ - 2 Score 0 0 0 0 0 0 0  PHQ- 9 Score    0 0      Fall Risk     03/24/2024   11:43 AM 07/02/2023   10:02 AM 03/20/2023    9:04 AM 11/27/2022   10:46 AM 05/28/2022   10:27 AM  Fall Risk   Falls in the past year? 0 0 0 0 0  Number falls in past yr: 0 0 0 0 0  Injury with Fall? 0 0 0 0 0  Risk for fall due to : No Fall Risks No Fall Risks No Fall Risks    Follow up Falls prevention discussed;Education provided;Falls evaluation completed Falls evaluation completed Falls evaluation completed Falls evaluation completed Falls evaluation completed      Data saved with a previous flowsheet row definition    MEDICARE RISK AT HOME:  Medicare Risk at Home Any stairs in or around the home?: No If so, are there any without handrails?: No Home free of loose throw rugs in walkways, pet beds, electrical cords, etc?: Yes Adequate lighting in your home to reduce risk of falls?: Yes Life alert?: No Use of a cane, walker or w/c?: No Grab bars in the bathroom?: Yes Shower chair or bench in shower?: No Elevated toilet seat or a handicapped toilet?: Yes  TIMED UP AND GO:  Was the test performed?  No  Cognitive Function: 6CIT completed    11/18/2017   11:19 AM 11/16/2016   10:17 AM 11/15/2015   10:36 AM  MMSE - Mini Mental State Exam  Orientation to time 5 5  5    Orientation to Place 5 5  5    Registration 3 3  3    Attention/ Calculation 5 5  5    Recall 3 3  3    Language- name 2 objects 2 2  2    Language- repeat 1 1 1   Language- follow 3 step command 3 3  3     Language- read & follow direction 1 1  1    Write a sentence 1 1  1    Copy design 1 1  1    Total score 30 30  30       Data saved with a previous flowsheet row definition        03/24/2024   11:43 AM 03/20/2023    9:10 AM 03/14/2022    9:08 AM  6CIT Screen  What Year? 0 points 0 points 0 points  What month? 0 points 0 points 0 points  What time? 0 points 0 points 0 points  Count back from 20 0 points 0 points 0 points  Months in reverse 0  points 0 points 0 points  Repeat phrase 0 points 0 points 0 points  Total Score 0 points 0 points 0 points    Immunizations Immunization History  Administered Date(s) Administered   Fluad  Quad(high Dose 65+) 04/21/2019, 05/13/2020, 05/05/2021, 05/28/2022   Fluad  Trivalent(High Dose 65+) 05/08/2023   INFLUENZA, HIGH DOSE SEASONAL PF 06/06/2016, 06/07/2017, 05/19/2018   Influenza,inj,Quad PF,6+ Mos 06/17/2015   PFIZER Comirnaty (Gray Top)Covid-19 Tri-Sucrose Vaccine 02/02/2021   PFIZER(Purple Top)SARS-COV-2 Vaccination 08/11/2019, 08/31/2019, 05/13/2020   PNEUMOCOCCAL CONJUGATE-20 01/20/2024   Pfizer Covid-19 Vaccine Bivalent Booster 67yrs & up 07/19/2021   Pfizer(Comirnaty )Fall Seasonal Vaccine 12 years and older 07/09/2022   Pneumococcal Conjugate-13 12/13/2014   Pneumococcal Polysaccharide-23 02/01/2010   Respiratory Syncytial Virus Vaccine ,Recomb Aduvanted(Arexvy ) 06/07/2022   Td 09/27/2001   Tdap 09/04/2012, 12/13/2022   Zoster Recombinant(Shingrix) 08/01/2021, 10/23/2021   Zoster, Unspecified 05/10/2022    Screening Tests Health Maintenance  Topic Date Due   INFLUENZA VACCINE  02/28/2024   COVID-19 Vaccine (7 - 2024-25 season) 07/29/2024 (Originally 03/31/2023)   Colonoscopy  01/05/2025   Medicare Annual Wellness (AWV)  03/24/2025   DTaP/Tdap/Td (4 - Td or Tdap) 12/12/2032   Pneumococcal Vaccine: 50+ Years  Completed   Hepatitis C Screening  Completed   Zoster Vaccines- Shingrix  Completed   HPV VACCINES  Aged Out    Meningococcal B Vaccine  Aged Out    Health Maintenance  Health Maintenance Due  Topic Date Due   INFLUENZA VACCINE  02/28/2024    Additional Screening:  Vision Screening: Recommended annual ophthalmology exams for early detection of glaucoma and other disorders of the eye. Would you like a referral to an eye doctor? No    Dental Screening: Recommended annual dental exams for proper oral hygiene  Community Resource Referral / Chronic Care Management: CRR required this visit?  No   CCM required this visit?  No   Plan:    I have personally reviewed and noted the following in the patient's chart:   Medical and social history Use of alcohol, tobacco or illicit drugs  Current medications and supplements including opioid prescriptions. Patient is not currently taking opioid prescriptions. Functional ability and status Nutritional status Physical activity Advanced directives List of other physicians Hospitalizations, surgeries, and ER visits in previous 12 months Vitals Screenings to include cognitive, depression, and falls Referrals and appointments  In addition, I have reviewed and discussed with patient certain preventive protocols, quality metrics, and best practice recommendations. A written personalized care plan for preventive services as well as general preventive health recommendations were provided to patient.   Lavelle Pfeiffer [place], [place]   [id number]/73/7974   After Visit Summary: (MyChart) Due to this being a telephonic visit, the after visit summary with patients personalized plan was offered to patient via MyChart   Notes: Nothing significant to report at this time.

## 2024-04-13 ENCOUNTER — Telehealth: Payer: Self-pay

## 2024-04-13 NOTE — Telephone Encounter (Signed)
 Spoke with patient regarding "bump above collarbone on the left side." Patient reports noticing the bump approximately 10 days ago. States its likely a lymph node but has not increased in size and may be slightly smaller than before. Denies any other symptoms. Informed patient that this information will be relayed to Dr. Sherrod and a call will be made with recommendations.

## 2024-04-14 NOTE — Telephone Encounter (Signed)
 Spoke with patient regarding Dr. Jeannett recommendations. Per Dr. Sherrod, patient is to monitor the area for now. Advised patient to contact our office with any further concerns or questions. Patient voiced understanding.

## 2024-04-28 ENCOUNTER — Other Ambulatory Visit: Payer: Self-pay | Admitting: Family Medicine

## 2024-05-08 ENCOUNTER — Other Ambulatory Visit (HOSPITAL_BASED_OUTPATIENT_CLINIC_OR_DEPARTMENT_OTHER): Payer: Self-pay

## 2024-05-08 MED ORDER — FLUZONE HIGH-DOSE 0.5 ML IM SUSY
0.5000 mL | PREFILLED_SYRINGE | Freq: Once | INTRAMUSCULAR | 0 refills | Status: AC
Start: 2024-05-08 — End: 2024-05-09
  Filled 2024-05-08: qty 0.5, 1d supply, fill #0

## 2024-05-25 NOTE — Progress Notes (Addendum)
 Kyle Long                                          MRN: [id number]   05/25/2024   The [facility] Quality Team Specialist reviewed this patient medical record for the purposes of chart review for care gap closure. The following were reviewed: chart review for care gap closure-controlling blood pressure.  07/20/2024- Abstracted CBP    [facility] Quality Team

## 2024-06-22 ENCOUNTER — Other Ambulatory Visit: Payer: Self-pay | Admitting: Family Medicine

## 2024-07-07 DIAGNOSIS — L814 Other melanin hyperpigmentation: Secondary | ICD-10-CM | POA: Diagnosis not present

## 2024-07-07 DIAGNOSIS — L400 Psoriasis vulgaris: Secondary | ICD-10-CM | POA: Diagnosis not present

## 2024-07-07 DIAGNOSIS — Z85828 Personal history of other malignant neoplasm of skin: Secondary | ICD-10-CM | POA: Diagnosis not present

## 2024-07-07 DIAGNOSIS — L570 Actinic keratosis: Secondary | ICD-10-CM | POA: Diagnosis not present

## 2024-07-07 DIAGNOSIS — D235 Other benign neoplasm of skin of trunk: Secondary | ICD-10-CM | POA: Diagnosis not present

## 2024-07-07 DIAGNOSIS — D225 Melanocytic nevi of trunk: Secondary | ICD-10-CM | POA: Diagnosis not present

## 2024-07-07 DIAGNOSIS — L579 Skin changes due to chronic exposure to nonionizing radiation, unspecified: Secondary | ICD-10-CM | POA: Diagnosis not present

## 2024-07-12 NOTE — Assessment & Plan Note (Signed)
 Patient encouraged to maintain heart healthy diet, regular exercise, adequate sleep. Consider daily probiotics. Take medications as prescribed. Labs ordered and reviewed. Has aged out of screening colonoscopies.

## 2024-07-12 NOTE — Assessment & Plan Note (Signed)
 Hydrate and monitor

## 2024-07-12 NOTE — Assessment & Plan Note (Signed)
 hgba1c acceptable, minimize simple carbs. Increase exercise as tolerated.

## 2024-07-12 NOTE — Assessment & Plan Note (Signed)
 Encourage heart healthy diet such as MIND or DASH diet, increase exercise, avoid trans fats, simple carbohydrates and processed foods, consider a krill or fish or flaxseed oil cap daily.

## 2024-07-12 NOTE — Progress Notes (Unsigned)
 Subjective:    Patient ID: Kyle Long, male    DOB: 04-18-1946, 78 y.o.   MRN: [id number]  No chief complaint on file.   HPI Discussed the use of AI scribe software for clinical note transcription with the patient, who gave verbal consent to proceed.  History of Present Illness     Past Medical History:  Diagnosis Date   Allergic state 06/26/2015   Amnesia 2009   isolated;for 30 mins w/elevated BP   Arthritis    Cancer (HCC)    skin / top head   Cataract    bilateral repair   Cellulitis 2011   LUE (no PMH of MRSA)/left elbow   Chronic kidney disease    resolved   Diverticulosis    Diverticulosis of colon    GERD (gastroesophageal reflux disease)    OCC HEARTBURN   Gilbert's syndrome    Hematuria 2009   Dr Excell   Hyperglycemia    Hyperlipidemia    Hypertension    Lung cancer, lower lobe (HCC)    Stage IIA (T1,N1)   Lymphoma (HCC)    Low grad   Neuromuscular disorder (HCC)    2010 TEMPORARY MEMORY LOSS 1-1.5 HR WAS CHECKED OUT AT [facility] , NOTHING FOUND   Peptic ulcer 1995   Peyronie disease    Preventative health care 12/19/2015   Primary cancer of left lower lobe of lung (HCC) 11/27/2011   Recurrent upper respiratory infection (URI)    09/2011  BRONCHITIS   SCC (squamous cell carcinoma) 01/21/2009   Qualifier: Diagnosis of  By: Tish MD, William   ? Basal Cell      Past Surgical History:  Procedure Laterality Date   chemo treatment      4 treatments that last 7 hours each/last treatmebt in 2013   COLONOSCOPY  2015   COLONOSCOPY W/ POLYPECTOMY  2005   due 2015   CYSTOSCOPY  2009   LOBECTOMY  11/02/2011   Procedure: LOBECTOMY;  Surgeon: Elspeth JAYSON Millers, MD;  Location: [facility] OR;  Service: Thoracic;  Laterality: Left;  LEFT LOWER LOBECTOMY   REFRACTIVE SURGERY     Bil   SKIN BIOPSY     4 0r 5   Squamous Cell Cancer Excision  12/13/2016   SUPRACLAVICAL NODE BIOPSY Left 10/15/2014   Procedure: LEFT  SUPRACLAVICAL LYMPH NODE BIOPSY;  Surgeon: Elspeth JAYSON Millers, MD;  Location: [facility] OR;  Service: Thoracic;  Laterality: Left;  LEFT SUPRACLAVICAL LYMPH NODE BIOPSY   VASECTOMY     VEIN SURGERY     R ankle    Family History  Problem Relation Age of Onset   Drug abuse Mother    Stroke Mother        in 62s   Hypertension Mother    Breast cancer Mother    Neuropathy Mother    Melanoma Father    Heart attack Father 70       stents   Dementia Father    Cancer Sister        multiple myeloma   Hypertension Sister    Thyroid  disease Sister        ??   Multiple myeloma Sister    Headache Daughter    Diabetes Paternal Aunt    Stomach cancer Maternal Grandfather 7   Lung cancer Maternal Grandfather    Cancer Paternal Grandmother        lung, nonsmoker   Colon cancer Neg Hx    Colon polyps Neg Hx  Esophageal cancer Neg Hx    Rectal cancer Neg Hx     Social History   Socioeconomic History   Marital status: Married    Spouse name: Not on file   Number of children: Not on file   Years of education: Not on file   Highest education level: Not on file  Occupational History   Occupation: retired  Tobacco Use   Smoking status: Former    Current packs/day: 0.00    Average packs/day: 1 pack/day for 15.0 years (15.0 ttl pk-yrs)    Types: Cigarettes    Start date: 07/30/1964    Quit date: 07/31/1979    Years since quitting: 44.9   Smokeless tobacco: Former    Types: Associate Professor status: Never Used  Substance and Sexual Activity   Alcohol use: Yes    Comment: Daily 3 glasses of vodka   Drug use: No   Sexual activity: Not Currently    Comment: lives with wife, retired from [employer], ran distribution. No dietary restrictions.  Other Topics Concern   Not on file  Social History Narrative   Reg exercise   Social Drivers of Health   Tobacco Use: Medium Risk (03/24/2024)   Patient History    Smoking Tobacco Use: Former     Smokeless Tobacco Use: Former    Passive Exposure: Not on Actuary Strain: Low Risk (03/24/2024)   Overall Financial Resource Strain (CARDIA)    Difficulty of Paying Living Expenses: Not hard at all  Food Insecurity: No Food Insecurity (03/24/2024)   Epic    Worried About Radiation Protection Practitioner of Food in the Last Year: Never true    Ran Out of Food in the Last Year: Never true  Transportation Needs: No Transportation Needs (03/24/2024)   Epic    Lack of Transportation (Medical): No    Lack of Transportation (Non-Medical): No  Physical Activity: Insufficiently Active (03/24/2024)   Exercise Vital Sign    Days of Exercise per Week: 3 days    Minutes of Exercise per Session: 30 min  Stress: No Stress Concern Present (03/24/2024)   [employer] of Occupational Health - Occupational Stress Questionnaire    Feeling of Stress: Not at all  Social Connections: Moderately Integrated (03/24/2024)   Social Connection and Isolation Panel    Frequency of Communication with Friends and Family: Twice a week    Frequency of Social Gatherings with Friends and Family: Twice a week    Attends Religious Services: More than 4 times per year    Active Member of [employer] or Organizations: No    Attends Banker Meetings: Never    Marital Status: Married  Catering Manager Violence: Not At Risk (03/24/2024)   Epic    Fear of Current or Ex-Partner: No    Emotionally Abused: No    Physically Abused: No    Sexually Abused: No  Depression (PHQ2-9): Low Risk (03/24/2024)   Depression (PHQ2-9)    PHQ-2 Score: 0  Alcohol Screen: Low Risk (03/24/2024)   Alcohol Screen    Last Alcohol Screening Score (AUDIT): 0  Housing: Unknown (03/24/2024)   Epic    Unable to Pay for Housing in the Last Year: No    Number of Times Moved in the Last Year: Not on file    Homeless in the Last Year: No  Utilities: Not At Risk (03/24/2024)   Epic    Threatened with loss of utilities: No  Health Literacy: Adequate Health Literacy (03/24/2024)   B1300 Health Literacy    Frequency of need for help with medical instructions: Never    Outpatient Medications Prior to Visit  Medication Sig Dispense Refill   diltiazem  (CARDIZEM  CD) 240 MG 24 hr capsule TAKE 1 CAPSULE DAILY 90 capsule 1   hydrochlorothiazide  (MICROZIDE ) 12.5 MG capsule Take 1 capsule (12.5 mg total) by mouth daily. 90 capsule 1   losartan  (COZAAR ) 100 MG tablet Take 0.5 tablets (50 mg total) by mouth daily. 45 tablet 1   metoprolol  tartrate (LOPRESSOR ) 25 MG tablet TAKE 1 TABLET TWICE A DAY 180 tablet 2   mometasone  (ELOCON ) 0.1 % cream Apply 1 application topically 2 (two) times daily as needed. Dr Nechama [facility] [place]     potassium chloride  (KLOR-CON  M10) 10 MEQ tablet TAKE 1 TABLET DAILY (Patient not taking: Reported on 03/24/2024) 90 tablet 0   No facility-administered medications prior to visit.    Allergies[1]  Review of Systems  Constitutional:  Negative for chills, fever and malaise/fatigue.  HENT:  Negative for congestion and hearing loss.   Eyes:  Negative for discharge.  Respiratory:  Negative for cough, sputum production and shortness of breath.   Cardiovascular:  Negative for chest pain, palpitations and leg swelling.  Gastrointestinal:  Negative for abdominal pain, blood in stool, constipation, diarrhea, heartburn, nausea and vomiting.  Genitourinary:  Negative for dysuria, frequency, hematuria and urgency.  Musculoskeletal:  Negative for back pain, falls and myalgias.  Skin:  Negative for rash.  Neurological:  Negative for dizziness, sensory change, loss of consciousness, weakness and headaches.  Endo/Heme/Allergies:  Negative for environmental allergies. Does not bruise/bleed easily.  Psychiatric/Behavioral:  Negative for depression and suicidal ideas. The patient is not nervous/anxious and does not have insomnia.        Objective:    Physical Exam Vitals  reviewed.  Constitutional:      General: He is not in acute distress.    Appearance: Normal appearance. He is not ill-appearing or diaphoretic.  HENT:     Head: Normocephalic and atraumatic.     Right Ear: Tympanic membrane, ear canal and external ear normal. There is no impacted cerumen.     Left Ear: Tympanic membrane, ear canal and external ear normal. There is no impacted cerumen.     Nose: Nose normal. No rhinorrhea.     Mouth/Throat:     Pharynx: Oropharynx is clear.  Eyes:     General: No scleral icterus.    Extraocular Movements: Extraocular movements intact.     Conjunctiva/sclera: Conjunctivae normal.     Pupils: Pupils are equal, round, and reactive to light.  Neck:     Thyroid : No thyroid  mass or thyroid  tenderness.  Cardiovascular:     Rate and Rhythm: Normal rate and regular rhythm.     Pulses: Normal pulses.     Heart sounds: Normal heart sounds. No murmur heard. Pulmonary:     Effort: Pulmonary effort is normal.     Breath sounds: Normal breath sounds. No wheezing.  Abdominal:     General: Bowel sounds are normal.     Palpations: Abdomen is soft. There is no mass.     Tenderness: There is no guarding.  Musculoskeletal:        General: No swelling. Normal range of motion.     Cervical back: Normal range of motion and neck supple. No rigidity.     Right lower leg: No edema.     Left lower leg:  No edema.  Lymphadenopathy:     Cervical: No cervical adenopathy.  Skin:    General: Skin is warm and dry.     Findings: No rash.  Neurological:     General: No focal deficit present.     Mental Status: He is alert and oriented to person, place, and time.     Cranial Nerves: No cranial nerve deficit.     Deep Tendon Reflexes: Reflexes normal.  Psychiatric:        Mood and Affect: Mood normal.        Behavior: Behavior normal.    There were no vitals taken for this visit. Wt Readings from Last 3 Encounters:  03/24/24 234 lb (106.1 kg)  08/28/23 234 lb (106.1 kg)   07/02/23 232 lb 9.6 oz (105.5 kg)    Diabetic Foot Exam - Simple   No data filed    Lab Results  Component Value Date   WBC 11.6 (H) 01/20/2024   HGB 14.7 01/20/2024   HCT 43.3 01/20/2024   PLT 201.0 01/20/2024   GLUCOSE 173 (H) 02/13/2024   CHOL 169 01/20/2024   TRIG 253.0 (H) 01/20/2024   HDL 39.70 01/20/2024   LDLDIRECT 90.0 11/27/2022   LDLCALC 78 01/20/2024   ALT 23 02/13/2024   AST 23 02/13/2024   NA 136 02/13/2024   K 3.5 02/13/2024   CL 94 (L) 02/13/2024   CREATININE 1.28 02/13/2024   BUN 23 02/13/2024   CO2 32 02/13/2024   TSH 2.19 01/20/2024   PSA 2.70 05/28/2022   INR 1.03 10/12/2014   HGBA1C 6.1 01/20/2024    Lab Results  Component Value Date   TSH 2.19 01/20/2024   Lab Results  Component Value Date   WBC 11.6 (H) 01/20/2024   HGB 14.7 01/20/2024   HCT 43.3 01/20/2024   MCV 95.0 01/20/2024   PLT 201.0 01/20/2024   Lab Results  Component Value Date   NA 136 02/13/2024   K 3.5 02/13/2024   CHLORIDE 104 02/15/2017   CO2 32 02/13/2024   GLUCOSE 173 (H) 02/13/2024   BUN 23 02/13/2024   CREATININE 1.28 02/13/2024   BILITOT 1.1 02/13/2024   ALKPHOS 48 02/13/2024   AST 23 02/13/2024   ALT 23 02/13/2024   PROT 6.8 02/13/2024   ALBUMIN 4.1 02/13/2024   CALCIUM  9.4 02/13/2024   ANIONGAP 7 08/21/2023   EGFR 49 (L) 02/15/2017   GFR 53.11 (L) 02/13/2024   Lab Results  Component Value Date   CHOL 169 01/20/2024   Lab Results  Component Value Date   HDL 39.70 01/20/2024   Lab Results  Component Value Date   LDLCALC 78 01/20/2024   Lab Results  Component Value Date   TRIG 253.0 (H) 01/20/2024   Lab Results  Component Value Date   CHOLHDL 4 01/20/2024   Lab Results  Component Value Date   HGBA1C 6.1 01/20/2024       Assessment & Plan:  Hyperglycemia Assessment & Plan: hgba1c acceptable, minimize simple carbs. Increase exercise as tolerated.    Hyperlipidemia, mixed Assessment & Plan: Encourage heart healthy diet such as  MIND or DASH diet, increase exercise, avoid trans fats, simple carbohydrates and processed foods, consider a krill or fish or flaxseed oil cap daily.    Essential hypertension Assessment & Plan: Well controlled, no changes to meds. Encouraged heart healthy diet such as the DASH diet and exercise as tolerated.     Stage 3 chronic kidney disease, unspecified whether stage 3a or  3b CKD (HCC) Assessment & Plan: Hydrate and monitor    Preventative health care Assessment & Plan: Patient encouraged to maintain heart healthy diet, regular exercise, adequate sleep. Consider daily probiotics. Take medications as prescribed. Labs ordered and reviewed. Has aged out of screening colonoscopies.      Assessment and Plan Assessment & Plan      Harlene Horton, MD     [1] Allergies Allergen Reactions   Lisinopril  Cough

## 2024-07-12 NOTE — Assessment & Plan Note (Signed)
 Well controlled, no changes to meds. Encouraged heart healthy diet such as the DASH diet and exercise as tolerated.

## 2024-07-13 ENCOUNTER — Ambulatory Visit: Admitting: Family Medicine

## 2024-07-13 ENCOUNTER — Ambulatory Visit: Payer: Self-pay | Admitting: Family

## 2024-07-13 ENCOUNTER — Encounter: Payer: Self-pay | Admitting: Family Medicine

## 2024-07-13 VITALS — BP 136/80 | HR 65 | Temp 97.9°F | Resp 16 | Ht 70.0 in | Wt 233.5 lb

## 2024-07-13 DIAGNOSIS — E782 Mixed hyperlipidemia: Secondary | ICD-10-CM

## 2024-07-13 DIAGNOSIS — C859A Non-Hodgkin lymphoma, unspecified, in remission: Secondary | ICD-10-CM | POA: Diagnosis not present

## 2024-07-13 DIAGNOSIS — I10 Essential (primary) hypertension: Secondary | ICD-10-CM

## 2024-07-13 DIAGNOSIS — Z0000 Encounter for general adult medical examination without abnormal findings: Secondary | ICD-10-CM | POA: Diagnosis not present

## 2024-07-13 DIAGNOSIS — R351 Nocturia: Secondary | ICD-10-CM

## 2024-07-13 DIAGNOSIS — N183 Chronic kidney disease, stage 3 unspecified: Secondary | ICD-10-CM

## 2024-07-13 DIAGNOSIS — R790 Abnormal level of blood mineral: Secondary | ICD-10-CM

## 2024-07-13 DIAGNOSIS — R739 Hyperglycemia, unspecified: Secondary | ICD-10-CM

## 2024-07-13 LAB — LIPID PANEL
Cholesterol: 157 mg/dL (ref 0–200)
HDL: 37 mg/dL — ABNORMAL LOW (ref >39.00)
LDL Cholesterol: 66 mg/dL (ref 0–99)
NonHDL: 119.97
Total CHOL/HDL Ratio: 4
Triglycerides: 272 mg/dL — ABNORMAL HIGH (ref 0.0–149.0)
VLDL: 54.4 mg/dL — ABNORMAL HIGH (ref 0.0–40.0)

## 2024-07-13 LAB — CBC WITH DIFFERENTIAL/PLATELET
Basophils Absolute: 0 K/uL (ref 0.0–0.1)
Basophils Relative: 0.2 % (ref 0.0–3.0)
Eosinophils Absolute: 0.1 K/uL (ref 0.0–0.7)
Eosinophils Relative: 0.7 % (ref 0.0–5.0)
HCT: 46 % (ref 39.0–52.0)
Hemoglobin: 15.8 g/dL (ref 13.0–17.0)
Lymphocytes Relative: 37.2 % (ref 12.0–46.0)
Lymphs Abs: 3.8 K/uL (ref 0.7–4.0)
MCHC: 34.5 g/dL (ref 30.0–36.0)
MCV: 95.6 fl (ref 78.0–100.0)
Monocytes Absolute: 0.8 K/uL (ref 0.1–1.0)
Monocytes Relative: 8.3 % (ref 3.0–12.0)
Neutro Abs: 5.5 K/uL (ref 1.4–7.7)
Neutrophils Relative %: 53.6 % (ref 43.0–77.0)
Platelets: 151 K/uL (ref 150.0–400.0)
RBC: 4.81 Mil/uL (ref 4.22–5.81)
RDW: 13.4 % (ref 11.5–15.5)
WBC: 10.3 K/uL (ref 4.0–10.5)

## 2024-07-13 LAB — COMPREHENSIVE METABOLIC PANEL WITH GFR
ALT: 24 U/L (ref 0–53)
AST: 22 U/L (ref 0–37)
Albumin: 4.6 g/dL (ref 3.5–5.2)
Alkaline Phosphatase: 55 U/L (ref 39–117)
BUN: 19 mg/dL (ref 6–23)
CO2: 33 meq/L — ABNORMAL HIGH (ref 19–32)
Calcium: 10 mg/dL (ref 8.4–10.5)
Chloride: 96 meq/L (ref 96–112)
Creatinine, Ser: 1.29 mg/dL (ref 0.40–1.50)
GFR: 52.46 mL/min — ABNORMAL LOW (ref >60.00)
Glucose, Bld: 108 mg/dL — ABNORMAL HIGH (ref 70–99)
Potassium: 4 meq/L (ref 3.5–5.1)
Sodium: 137 meq/L (ref 135–145)
Total Bilirubin: 1.3 mg/dL — ABNORMAL HIGH (ref 0.2–1.2)
Total Protein: 7.3 g/dL (ref 6.0–8.3)

## 2024-07-13 LAB — MAGNESIUM: Magnesium: 1.7 mg/dL (ref 1.5–2.5)

## 2024-07-13 LAB — PSA: PSA: 2.82 ng/mL (ref 0.10–4.00)

## 2024-07-13 LAB — TSH: TSH: 2.53 u[IU]/mL (ref 0.35–5.50)

## 2024-07-13 LAB — HEMOGLOBIN A1C: Hgb A1c MFr Bld: 5.5 % (ref 4.6–6.5)

## 2024-07-13 NOTE — Assessment & Plan Note (Signed)
 Wakes on average 5 x a night. Does not want to return to [facility] on [address] will try to make him an appt here at [facility] and check PSA today

## 2024-07-13 NOTE — Patient Instructions (Addendum)
 Look up foods containing potassium if your potassium is mildly low again on your blood work  Annual Covid  All [facility] are walk in vaccine clinic M-F 9-4   Preventive Care 65 Years and Older, Male Preventive care refers to lifestyle choices and visits with your health care provider that can promote health and wellness. Preventive care visits are also called wellness exams. What can I expect for my preventive care visit? Counseling During your preventive care visit, your health care provider may ask about your: Medical history, including: Past medical problems. Family medical history. History of falls. Current health, including: Emotional well-being. Home life and relationship well-being. Sexual activity. Memory and ability to understand (cognition). Lifestyle, including: Alcohol, nicotine or tobacco, and drug use. Access to firearms. Diet, exercise, and sleep habits. Work and work astronomer. Sunscreen use. Safety issues such as seatbelt and bike helmet use. Physical exam Your health care provider will check your: Height and weight. These may be used to calculate your BMI (body mass index). BMI is a measurement that tells if you are at a healthy weight. Waist circumference. This measures the distance around your waistline. This measurement also tells if you are at a healthy weight and may help predict your risk of certain diseases, such as type 2 diabetes and high blood pressure. Heart rate and blood pressure. Body temperature. Skin for abnormal spots. What immunizations do I need?  Vaccines are usually given at various ages, according to a schedule. Your health care provider will recommend vaccines for you based on your age, medical history, and lifestyle or other factors, such as travel or where you work. What tests do I need? Screening Your health care provider may recommend screening tests for certain conditions. This may include: Lipid and cholesterol  levels. Diabetes screening. This is done by checking your blood sugar (glucose) after you have not eaten for a while (fasting). Hepatitis C test. Hepatitis B test. HIV (human immunodeficiency virus) test. STI (sexually transmitted infection) testing, if you are at risk. Lung cancer screening. Colorectal cancer screening. Prostate cancer screening. Abdominal aortic aneurysm (AAA) screening. You may need this if you are a current or former smoker. Talk with your health care provider about your test results, treatment options, and if necessary, the need for more tests. Follow these instructions at home: Eating and drinking  Eat a diet that includes fresh fruits and vegetables, whole grains, lean protein, and low-fat dairy products. Limit your intake of foods with high amounts of sugar, saturated fats, and salt. Take vitamin and mineral supplements as recommended by your health care provider. Do not drink alcohol if your health care provider tells you not to drink. If you drink alcohol: Limit how much you have to 0-2 drinks a day. Know how much alcohol is in your drink. In the [place] one drink equals one 12 oz bottle of beer (355 mL), one 5 oz glass of wine (148 mL), or one 1 oz glass of hard liquor (44 mL). Lifestyle Brush your teeth every morning and night with fluoride toothpaste. Floss one time each day. Exercise for at least 30 minutes 5 or more days each week. Do not use any products that contain nicotine or tobacco. These products include cigarettes, chewing tobacco, and vaping devices, such as e-cigarettes. If you need help quitting, ask your health care provider. Do not use drugs. If you are sexually active, practice safe sex. Use a condom or other form of protection to prevent STIs. Take aspirin only as told  by your health care provider. Make sure that you understand how much to take and what form to take. Work with your health care provider to find out whether it is safe and  beneficial for you to take aspirin daily. Ask your health care provider if you need to take a cholesterol-lowering medicine (statin). Find healthy ways to manage stress, such as: Meditation, yoga, or listening to music. Journaling. Talking to a trusted person. Spending time with friends and family. Safety Always wear your seat belt while driving or riding in a vehicle. Do not drive: If you have been drinking alcohol. Do not ride with someone who has been drinking. When you are tired or distracted. While texting. If you have been using any mind-altering substances or drugs. Wear a helmet and other protective equipment during sports activities. If you have firearms in your house, make sure you follow all gun safety procedures. Minimize exposure to UV radiation to reduce your risk of skin cancer. What's next? Visit your health care provider once a year for an annual wellness visit. Ask your health care provider how often you should have your eyes and teeth checked. Stay up to date on all vaccines. This information is not intended to replace advice given to you by your health care provider. Make sure you discuss any questions you have with your health care provider. Document Revised: 01/11/2021 Document Reviewed: 01/11/2021 Elsevier Patient Education  2024 [employer].

## 2024-07-15 NOTE — Assessment & Plan Note (Signed)
 Continues to follow with oncology for surveillance of indolent disease

## 2024-08-13 ENCOUNTER — Ambulatory Visit: Payer: Self-pay

## 2024-08-13 ENCOUNTER — Telehealth: Payer: Self-pay | Admitting: Medical Oncology

## 2024-08-13 NOTE — Telephone Encounter (Signed)
 Appt scheduled

## 2024-08-13 NOTE — Telephone Encounter (Signed)
 Pt notified that Dr. Sherrod said It is likely his low-grade follicular lymphoma. We will wait for the scan. Reiterated that pt still needs to see his provider tomorrow.

## 2024-08-13 NOTE — Telephone Encounter (Signed)
 Pt reports new  enlarged lymph node  in L armpit with swelling above left breast. He is very concerned about new  prominent veins that are present  above his breast . Non tender , no redness .   He has appt at [facility] tomorrow for same.  Hx of small lymphocytic lymphoma . Annual  scan is jan 26th.SABRA

## 2024-08-13 NOTE — Telephone Encounter (Signed)
 FYI Only or Action Required?: FYI only for provider: appointment scheduled on 1/16.  Patient was last seen in primary care on 07/13/2024 by Domenica Harlene LABOR, MD.  Called Nurse Triage reporting [place].  Symptoms began several weeks ago.  Interventions attempted: Nothing.  Symptoms are: gradually worsening.  Triage Disposition: See Physician Within 24 Hours  Patient/caregiver understands and will follow disposition?: Yes, will follow disposition  Copied from CRM [id number]. Topic: Clinical - Red Word Triage >> Aug 13, 2024 10:53 AM Avram MATSU wrote: Red Word that prompted transfer to Nurse Triage: swollen node under arm pit and he sees blue veins Reason for Disposition  [1] Swelling is painful to touch AND [2] no fever  Answer Assessment - Initial Assessment Questions 1. APPEARANCE of SWELLING: What does it look like?     Blue veins 2. SIZE: How large is the swelling? (e.g., inches, cm; or compare to size of pinhead, tip of pen, eraser, coin, pea, grape, ping pong ball)      grape 3. LOCATION: Where is the swelling located?     armpit 4. ONSET: When did the swelling start?     weeks 5. COLOR: What color is it? Is there more than one color?     Blue veins recently 6. PAIN: Is there any pain? If Yes, ask: How bad is the pain? (Scale 1-10; or mild, moderate, severe)       Mild-moderate 7. ITCH: Does it itch? If Yes, ask: How bad is the itch?      denies 8. CAUSE: What do you think caused the swelling?     Unsure, research says blood clot 9 OTHER SYMPTOMS: Do you have any other symptoms? (e.g., fever)     denies  Protocols used: Skin Lump or Localized Swelling-A-AH

## 2024-08-14 ENCOUNTER — Ambulatory Visit: Admitting: Family Medicine

## 2024-08-14 ENCOUNTER — Encounter: Payer: Self-pay | Admitting: Family Medicine

## 2024-08-14 VITALS — BP 132/80 | HR 77 | Temp 98.0°F | Resp 16 | Ht 70.0 in | Wt 230.8 lb

## 2024-08-14 DIAGNOSIS — R591 Generalized enlarged lymph nodes: Secondary | ICD-10-CM

## 2024-08-14 NOTE — Patient Instructions (Signed)
 OK to take Tylenol  1000 mg (2 extra strength tabs) or 975 mg (3 regular strength tabs) every 6 hours as needed.  Please do not miss your scans with the oncology team.   Let me know if anything changes.

## 2024-08-14 NOTE — Progress Notes (Signed)
 Chief Complaint  Patient presents with   Arm Pain    Left Armpit  Swelling    Kyle Long is a 79 y.o. male here for a skin complaint.  Duration: 1 day Location: L armpit Pruritic? No Painful? No Drainage? No New soaps/lotions/topicals/detergents? No He does have a hx of lymphoma, f/w oncology, Dr. Sherrod.  Trauma? No Other associated symptoms: slight swelling over area above clavicle and L axillary region; no fevers, night sweats, unintentional wt loss, inactivity, extremity swelling, color changes, recent vaccinations Therapies tried thus far: none  Past Medical History:  Diagnosis Date   Allergic state 06/26/2015   Amnesia 2009   isolated;for 30 mins w/elevated BP   Arthritis    Cancer (HCC)    skin / top head   Cataract    bilateral repair   Cellulitis 2011   LUE (no PMH of MRSA)/left elbow   Chronic kidney disease    resolved   Diverticulosis of colon    GERD (gastroesophageal reflux disease)    OCC HEARTBURN   Gilbert's syndrome    Hematuria 2009   Dr Excell   Hyperglycemia    Hyperlipidemia    Hypertension    Lung cancer, lower lobe (HCC)    Stage IIA (T1,N1)   Lymphoma (HCC)    Low grad   Neuromuscular disorder (HCC)    2010 TEMPORARY MEMORY LOSS 1-1.5 HR WAS CHECKED OUT AT [facility] , NOTHING FOUND   Peptic ulcer 1995   Peyronie disease    Preventative health care 12/19/2015   Primary cancer of left lower lobe of lung (HCC) 11/27/2011   Recurrent upper respiratory infection (URI)    09/2011  BRONCHITIS   SCC (squamous cell carcinoma) 01/21/2009   Qualifier: Diagnosis of  By: Tish MD, Elsie   ? Basal Cell      BP 132/80 (BP Location: Left Arm, Patient Position: Sitting)   Pulse 77   Temp 98 F (36.7 C) (Oral)   Resp 16   Ht 5' 10 (1.778 m)   Wt 230 lb 12.8 oz (104.7 kg)   SpO2 97%   BMI 33.12 kg/m  Gen: awake, alert, appearing stated age Lungs: No accessory muscle use Skin: soft tiss swelling noted in the supraclav region  and axillary region compared to the R with palpable LN's without ttp. They are freely moveable. No drainage, erythema, TTP, fluctuance, excoriation, ecchymosis.  Psych: Age appropriate judgment and insight  Lymphadenopathy  Swelling is over areas of adenopathy. Is not having extremity swelling or evidence of trauma, pain or history of immobilization. No recent vaccinations or other obvious cause for this outside of hx of lymphoma. Oncology has imaging in 10 d. He will let me know if anything changes in the meanwhile. Tylenol  prn, reassurance. F/u prn. The patient voiced understanding and agreement to the plan.  Mabel [place], DO 08/14/24 11:58 AM

## 2024-08-21 ENCOUNTER — Telehealth: Payer: Self-pay

## 2024-08-21 NOTE — Telephone Encounter (Signed)
 Spoke with patient regarding CT scan scheduled for Monday, 08/24/24. Inquired whether patient planned to attend appointment due to anticipated winter weather over the weekend. Patient stated that he will contact scheduling to reschedule the CT scan and will call our office back to adjust his lab appointment prior to CT scan and follow-up visit with Dr. Sherrod.  He voiced understanding.

## 2024-08-24 ENCOUNTER — Ambulatory Visit (HOSPITAL_COMMUNITY)

## 2024-08-24 ENCOUNTER — Inpatient Hospital Stay: Payer: Medicare HMO

## 2024-08-24 ENCOUNTER — Ambulatory Visit: Admitting: Urology

## 2024-08-24 ENCOUNTER — Other Ambulatory Visit: Payer: Self-pay | Admitting: Family Medicine

## 2024-08-24 ENCOUNTER — Inpatient Hospital Stay

## 2024-08-25 ENCOUNTER — Inpatient Hospital Stay: Attending: Internal Medicine

## 2024-08-25 ENCOUNTER — Ambulatory Visit (HOSPITAL_COMMUNITY)
Admission: RE | Admit: 2024-08-25 | Discharge: 2024-08-25 | Disposition: A | Discharge status: Home / Self Care | Source: Ambulatory Visit | Attending: Internal Medicine | Admitting: Internal Medicine

## 2024-08-25 ENCOUNTER — Encounter (HOSPITAL_COMMUNITY): Payer: Self-pay

## 2024-08-25 DIAGNOSIS — Z85118 Personal history of other malignant neoplasm of bronchus and lung: Secondary | ICD-10-CM | POA: Insufficient documentation

## 2024-08-25 DIAGNOSIS — Z902 Acquired absence of lung [part of]: Secondary | ICD-10-CM | POA: Diagnosis not present

## 2024-08-25 DIAGNOSIS — Z8572 Personal history of non-Hodgkin lymphomas: Secondary | ICD-10-CM | POA: Diagnosis not present

## 2024-08-25 DIAGNOSIS — C3490 Malignant neoplasm of unspecified part of unspecified bronchus or lung: Secondary | ICD-10-CM

## 2024-08-25 LAB — CMP (CANCER CENTER ONLY)
ALT: 24 U/L (ref 0–44)
AST: 24 U/L (ref 15–41)
Albumin: 4.4 g/dL (ref 3.5–5.0)
Alkaline Phosphatase: 58 U/L (ref 38–126)
Anion gap: 12 (ref 5–15)
BUN: 21 mg/dL (ref 8–23)
CO2: 29 mmol/L (ref 22–32)
Calcium: 10 mg/dL (ref 8.9–10.3)
Chloride: 98 mmol/L (ref 98–111)
Creatinine: 1.35 mg/dL — ABNORMAL HIGH (ref 0.61–1.24)
GFR, Estimated: 53 mL/min — ABNORMAL LOW
Glucose, Bld: 119 mg/dL — ABNORMAL HIGH (ref 70–99)
Potassium: 3.6 mmol/L (ref 3.5–5.1)
Sodium: 140 mmol/L (ref 135–145)
Total Bilirubin: 0.9 mg/dL (ref 0.0–1.2)
Total Protein: 7.4 g/dL (ref 6.5–8.1)

## 2024-08-25 LAB — CBC WITH DIFFERENTIAL (CANCER CENTER ONLY)
Abs Immature Granulocytes: 0.05 10*3/uL (ref 0.00–0.07)
Basophils Absolute: 0.1 10*3/uL (ref 0.0–0.1)
Basophils Relative: 1 %
Eosinophils Absolute: 0.1 10*3/uL (ref 0.0–0.5)
Eosinophils Relative: 1 %
HCT: 45 % (ref 39.0–52.0)
Hemoglobin: 15.7 g/dL (ref 13.0–17.0)
Immature Granulocytes: 1 %
Lymphocytes Relative: 34 %
Lymphs Abs: 3.4 10*3/uL (ref 0.7–4.0)
MCH: 32.2 pg (ref 26.0–34.0)
MCHC: 34.9 g/dL (ref 30.0–36.0)
MCV: 92.4 fL (ref 80.0–100.0)
Monocytes Absolute: 0.8 10*3/uL (ref 0.1–1.0)
Monocytes Relative: 8 %
Neutro Abs: 5.6 10*3/uL (ref 1.7–7.7)
Neutrophils Relative %: 55 %
Platelet Count: 148 10*3/uL — ABNORMAL LOW (ref 150–400)
RBC: 4.87 MIL/uL (ref 4.22–5.81)
RDW: 12.5 % (ref 11.5–15.5)
WBC Count: 10 10*3/uL (ref 4.0–10.5)
nRBC: 0 % (ref 0.0–0.2)

## 2024-08-25 LAB — LACTATE DEHYDROGENASE: LDH: 159 U/L (ref 105–235)

## 2024-08-25 MED ORDER — IOHEXOL 300 MG/ML  SOLN
100.0000 mL | Freq: Once | INTRAMUSCULAR | Status: AC | PRN
  Administered 2024-08-25: 75 mL via INTRAVENOUS

## 2024-08-25 MED ORDER — IOHEXOL 9 MG/ML PO SOLN
500.0000 mL | ORAL | Status: AC
  Administered 2024-08-25 (×2): 500 mL via ORAL

## 2024-08-26 ENCOUNTER — Ambulatory Visit: Admitting: Urology

## 2024-08-26 ENCOUNTER — Encounter: Payer: Self-pay | Admitting: Urology

## 2024-08-26 VITALS — BP 157/87 | HR 80 | Ht 70.0 in | Wt 225.0 lb

## 2024-08-26 DIAGNOSIS — N138 Other obstructive and reflux uropathy: Secondary | ICD-10-CM

## 2024-08-26 DIAGNOSIS — N401 Enlarged prostate with lower urinary tract symptoms: Secondary | ICD-10-CM

## 2024-08-26 DIAGNOSIS — R351 Nocturia: Secondary | ICD-10-CM

## 2024-08-26 LAB — URINALYSIS, ROUTINE W REFLEX MICROSCOPIC
Bilirubin, UA: NEGATIVE
Glucose, UA: NEGATIVE
Ketones, UA: NEGATIVE
Nitrite, UA: NEGATIVE
Protein,UA: NEGATIVE
RBC, UA: NEGATIVE
Specific Gravity, UA: 1.01 (ref 1.005–1.030)
Urobilinogen, Ur: 2 mg/dL — ABNORMAL HIGH (ref 0.2–1.0)
pH, UA: 6 (ref 5.0–7.5)

## 2024-08-26 LAB — MICROSCOPIC EXAMINATION

## 2024-08-26 LAB — BLADDER SCAN AMB NON-IMAGING

## 2024-08-26 NOTE — Progress Notes (Addendum)
 "  Assessment: 1. Nocturia   2. BPH with obstruction/lower urinary tract symptoms     Plan: I personally reviewed the patient's chart including provider notes, lab results. I reviewed records from [facility]. Possible causes of nocturia discussed with the patient including BPH, OAB, fluid shifts with positional changes, sleep disturbances, increased fluid intake prior to bedtime, and overproduction of urine at night. Voiding diary to evaluate nocturia. Will make recommendations for management of nocturia after review of voiding diary  Chief Complaint:  Chief Complaint  Patient presents with   Nocturia    History of Present Illness:  Kyle Long is a 79 y.o. male who is seen in consultation from Domenica Harlene LABOR, MD for evaluation of nocturia. He has a history of lower urinary tract symptoms for several years.  His primary complaint is nocturia 5-12 times per night.  He has some early morning urgency.  He reports voiding with a good stream.  No dysuria or gross hematuria.  No history of UTIs or kidney stones. IPSS = 18/4. He previously saw Dr.Puchinsky 16 years ago for hematuria evaluation which was negative.  He also had a TUMT procedure.  He was seen by Dr. Watt in May 2024.  PVR at that time was 35 mL.  He was started on tadalafil 5 mg daily.  He took the medication for approximately 1 week and discontinued due to gross hematuria.  He has not had any further episodes of gross hematuria.  PSA 12/25:  2.82  CT abdomen and pelvis from 08/25/2024 showed small well-defined low-density lesions in the right kidney likely benign cyst, no evidence of obstruction, enlarged prostate.  Past Medical History:  Past Medical History:  Diagnosis Date   Allergic state 06/26/2015   Amnesia 2009   isolated;for 30 mins w/elevated BP   Arthritis    Cancer (HCC)    skin / top head   Cataract    bilateral repair   Cellulitis 2011   LUE (no PMH of MRSA)/left elbow   Chronic kidney  disease    resolved   Diverticulosis of colon    GERD (gastroesophageal reflux disease)    OCC HEARTBURN   Gilbert's syndrome    Hematuria 2009   Dr Excell   Hyperglycemia    Hyperlipidemia    Hypertension    Lung cancer, lower lobe (HCC)    Stage IIA (T1,N1)   Lymphoma (HCC)    Low grad   Neuromuscular disorder (HCC)    2010 TEMPORARY MEMORY LOSS 1-1.5 HR WAS CHECKED OUT AT [facility] , NOTHING FOUND   Peptic ulcer 1995   Peyronie disease    Preventative health care 12/19/2015   Primary cancer of left lower lobe of lung (HCC) 11/27/2011   Recurrent upper respiratory infection (URI)    09/2011  BRONCHITIS   SCC (squamous cell carcinoma) 01/21/2009   Qualifier: Diagnosis of  By: Tish MD, William   ? Basal Cell      Past Surgical History:  Past Surgical History:  Procedure Laterality Date   chemo treatment      4 treatments that last 7 hours each/last treatmebt in 2013   COLONOSCOPY  2015   COLONOSCOPY W/ POLYPECTOMY  2005   due 2015   CYSTOSCOPY  2009   LOBECTOMY  11/02/2011   Procedure: LOBECTOMY;  Surgeon: Elspeth JAYSON Millers, MD;  Location: [facility] OR;  Service: Thoracic;  Laterality: Left;  LEFT LOWER LOBECTOMY   REFRACTIVE SURGERY     Bil  SKIN BIOPSY     4 0r 5   Squamous Cell Cancer Excision  12/13/2016   SUPRACLAVICAL NODE BIOPSY Left 10/15/2014   Procedure: LEFT SUPRACLAVICAL LYMPH NODE BIOPSY;  Surgeon: Elspeth JAYSON Millers, MD;  Location: [facility] OR;  Service: Thoracic;  Laterality: Left;  LEFT SUPRACLAVICAL LYMPH NODE BIOPSY   VASECTOMY     VEIN SURGERY     R ankle    Allergies:  Allergies[1]  Family History:  Family History  Problem Relation Age of Onset   Drug abuse Mother    Stroke Mother        in 31s   Hypertension Mother    Breast cancer Mother    Neuropathy Mother    Melanoma Father    Heart attack Father 18       stents   Dementia Father    Cancer Sister        multiple myeloma   Hypertension Sister    Thyroid  disease Sister         ??   Multiple myeloma Sister    Headache Daughter    Diabetes Paternal Aunt    Stomach cancer Maternal Grandfather 11   Lung cancer Maternal Grandfather    Cancer Paternal Grandmother        lung, nonsmoker   Colon cancer Neg Hx    Colon polyps Neg Hx    Esophageal cancer Neg Hx    Rectal cancer Neg Hx     Social History:  Social History[2]  Review of symptoms:  Constitutional:  Negative for unexplained weight loss, night sweats, fever, chills ENT:  Negative for nose bleeds, sinus pain, painful swallowing CV:  Negative for chest pain, shortness of breath, exercise intolerance, palpitations, loss of consciousness Resp:  Negative for cough, wheezing, shortness of breath GI:  Negative for nausea, vomiting, diarrhea, bloody stools GU:  Positives noted in HPI; otherwise negative for gross hematuria, dysuria, urinary incontinence Neuro:  Negative for seizures, poor balance, limb weakness, slurred speech Psych:  Negative for lack of energy, depression, anxiety Endocrine:  Negative for polydipsia, polyuria, symptoms of hypoglycemia (dizziness, hunger, sweating) Hematologic:  Negative for anemia, purpura, petechia, prolonged or excessive bleeding, use of anticoagulants  Allergic:  Negative for difficulty breathing or choking as a result of exposure to anything; no shellfish allergy; no allergic response (rash/itch) to materials, foods  Physical exam: BP (!) 157/87   Pulse 80   Ht 5' 10 (1.778 m)   Wt 225 lb (102.1 kg)   BMI 32.28 kg/m  GENERAL APPEARANCE:  Well appearing, well developed, well nourished, NAD HEENT: Atraumatic, Normocephalic, oropharynx clear. NECK: Supple without lymphadenopathy or thyromegaly. LUNGS: Clear to auscultation bilaterally. HEART: Regular Rate and Rhythm without murmurs, gallops, or rubs. ABDOMEN: Soft, non-tender, No Masses. EXTREMITIES: Moves all extremities well.  Without clubbing, cyanosis, or edema. NEUROLOGIC:  Alert and oriented x 3, normal gait,  CN II-XII grossly intact.  MENTAL STATUS:  Appropriate. BACK:  Non-tender to palpation.  No CVAT SKIN:  Warm, dry and intact.   GU: Penis:  circumcised Meatus: Normal Scrotum: normal, no masses Testis: normal without masses bilateral Prostate: 50 g, NT, no nodules Rectum: Normal tone,  no masses or tenderness  Results: U/A: 0-5 WBCs, 0-2 RBCs  PVR = 68 ml     [1]  Allergies Allergen Reactions   Lisinopril  Cough  [2]  Social History Tobacco Use   Smoking status: Former    Current packs/day: 0.00    Average packs/day: 1 pack/day for  15.0 years (15.0 ttl pk-yrs)    Types: Cigarettes    Start date: 07/30/1964    Quit date: 07/31/1979    Years since quitting: 45.1   Smokeless tobacco: Former    Types: Engineer, Drilling   Vaping status: Never Used  Substance Use Topics   Alcohol use: Yes    Comment: Daily 3 glasses of vodka   Drug use: No   "

## 2024-08-31 ENCOUNTER — Inpatient Hospital Stay: Payer: Medicare HMO | Admitting: Internal Medicine

## 2024-08-31 DIAGNOSIS — C3490 Malignant neoplasm of unspecified part of unspecified bronchus or lung: Secondary | ICD-10-CM

## 2025-01-11 ENCOUNTER — Ambulatory Visit: Admitting: Family Medicine

## 2025-01-13 ENCOUNTER — Ambulatory Visit: Admitting: Student

## 2025-04-06 ENCOUNTER — Ambulatory Visit

## 2025-07-14 ENCOUNTER — Encounter: Admitting: Student

## 2025-07-15 ENCOUNTER — Encounter: Admitting: Family Medicine

## 2025-08-25 ENCOUNTER — Inpatient Hospital Stay

## 2025-09-01 ENCOUNTER — Inpatient Hospital Stay: Admitting: Internal Medicine
